# Patient Record
Sex: Female | Born: 1940
Health system: Southern US, Community
[De-identification: ages and names within clinical notes are randomized; demographics above are authoritative.]

## PROBLEM LIST (undated history)

## (undated) DIAGNOSIS — K219 Gastro-esophageal reflux disease without esophagitis: Secondary | ICD-10-CM

## (undated) DIAGNOSIS — T7840XA Allergy, unspecified, initial encounter: Secondary | ICD-10-CM

## (undated) DIAGNOSIS — J984 Other disorders of lung: Secondary | ICD-10-CM

## (undated) DIAGNOSIS — J439 Emphysema, unspecified: Secondary | ICD-10-CM

## (undated) DIAGNOSIS — E785 Hyperlipidemia, unspecified: Secondary | ICD-10-CM

## (undated) DIAGNOSIS — R0600 Dyspnea, unspecified: Secondary | ICD-10-CM

## (undated) DIAGNOSIS — K589 Irritable bowel syndrome without diarrhea: Secondary | ICD-10-CM

## (undated) DIAGNOSIS — M1990 Unspecified osteoarthritis, unspecified site: Secondary | ICD-10-CM

## (undated) DIAGNOSIS — G629 Polyneuropathy, unspecified: Secondary | ICD-10-CM

## (undated) DIAGNOSIS — M51379 Other intervertebral disc degeneration, lumbosacral region without mention of lumbar back pain or lower extremity pain: Secondary | ICD-10-CM

## (undated) DIAGNOSIS — C50919 Malignant neoplasm of unspecified site of unspecified female breast: Secondary | ICD-10-CM

## (undated) DIAGNOSIS — M5137 Other intervertebral disc degeneration, lumbosacral region: Secondary | ICD-10-CM

## (undated) DIAGNOSIS — J189 Pneumonia, unspecified organism: Secondary | ICD-10-CM

## (undated) DIAGNOSIS — I2510 Atherosclerotic heart disease of native coronary artery without angina pectoris: Secondary | ICD-10-CM

## (undated) DIAGNOSIS — G589 Mononeuropathy, unspecified: Secondary | ICD-10-CM

## (undated) DIAGNOSIS — I10 Essential (primary) hypertension: Secondary | ICD-10-CM

## (undated) HISTORY — DX: Mononeuropathy, unspecified: G58.9

## (undated) HISTORY — PX: PARTIAL HYSTERECTOMY: SHX80

## (undated) HISTORY — DX: Irritable bowel syndrome, unspecified: K58.9

## (undated) HISTORY — DX: Allergy, unspecified, initial encounter: T78.40XA

## (undated) HISTORY — DX: Gastro-esophageal reflux disease without esophagitis: K21.9

## (undated) HISTORY — DX: Unspecified osteoarthritis, unspecified site: M19.90

## (undated) HISTORY — PX: UPPER GASTROINTESTINAL ENDOSCOPY: SHX188

## (undated) HISTORY — DX: Hyperlipidemia, unspecified: E78.5

## (undated) HISTORY — DX: Essential (primary) hypertension: I10

## (undated) HISTORY — PX: TONSILLECTOMY AND ADENOIDECTOMY: SUR1326

## (undated) HISTORY — PX: COLONOSCOPY: SHX174

## (undated) HISTORY — DX: Malignant neoplasm of unspecified site of unspecified female breast: C50.919

## (undated) HISTORY — PX: EYE SURGERY: SHX253

---

## 1980-01-13 HISTORY — PX: BREAST FIBROADENOMA SURGERY: SHX580

## 1989-04-10 HISTORY — PX: MASTECTOMY PARTIAL / LUMPECTOMY W/ AXILLARY LYMPHADENECTOMY: SUR852

## 1999-05-28 ENCOUNTER — Other Ambulatory Visit: Admission: RE | Admit: 1999-05-28 | Discharge: 1999-05-28 | Payer: Self-pay | Admitting: Internal Medicine

## 1999-10-07 DIAGNOSIS — I2510 Atherosclerotic heart disease of native coronary artery without angina pectoris: Secondary | ICD-10-CM

## 1999-10-07 HISTORY — DX: Atherosclerotic heart disease of native coronary artery without angina pectoris: I25.10

## 1999-10-07 HISTORY — PX: CORONARY ANGIOPLASTY: SHX604

## 1999-10-07 HISTORY — PX: CORONARY ARTERY BYPASS GRAFT: SHX141

## 1999-10-15 ENCOUNTER — Ambulatory Visit (HOSPITAL_BASED_OUTPATIENT_CLINIC_OR_DEPARTMENT_OTHER): Admission: RE | Admit: 1999-10-15 | Discharge: 1999-10-15 | Payer: Self-pay | Admitting: Plastic Surgery

## 1999-12-16 ENCOUNTER — Ambulatory Visit (HOSPITAL_COMMUNITY): Admission: RE | Admit: 1999-12-16 | Discharge: 1999-12-17 | Payer: Self-pay | Admitting: Interventional Cardiology

## 2000-05-28 ENCOUNTER — Encounter: Payer: Self-pay | Admitting: Surgery

## 2000-05-28 ENCOUNTER — Inpatient Hospital Stay (HOSPITAL_COMMUNITY): Admission: AD | Admit: 2000-05-28 | Discharge: 2000-06-01 | Payer: Self-pay | Admitting: *Deleted

## 2000-05-29 ENCOUNTER — Encounter: Payer: Self-pay | Admitting: Surgery

## 2000-05-30 ENCOUNTER — Encounter: Payer: Self-pay | Admitting: Surgery

## 2000-06-23 ENCOUNTER — Encounter (HOSPITAL_COMMUNITY): Admission: RE | Admit: 2000-06-23 | Discharge: 2000-09-21 | Payer: Self-pay | Admitting: *Deleted

## 2001-02-08 ENCOUNTER — Other Ambulatory Visit: Admission: RE | Admit: 2001-02-08 | Discharge: 2001-02-08 | Payer: Self-pay | Admitting: Obstetrics and Gynecology

## 2001-10-22 ENCOUNTER — Emergency Department (HOSPITAL_COMMUNITY): Admission: EM | Admit: 2001-10-22 | Discharge: 2001-10-22 | Payer: Self-pay | Admitting: Emergency Medicine

## 2001-10-22 ENCOUNTER — Encounter: Payer: Self-pay | Admitting: Emergency Medicine

## 2003-12-22 ENCOUNTER — Encounter (INDEPENDENT_AMBULATORY_CARE_PROVIDER_SITE_OTHER): Payer: Self-pay | Admitting: *Deleted

## 2003-12-22 ENCOUNTER — Encounter: Admission: RE | Admit: 2003-12-22 | Discharge: 2003-12-22 | Payer: Self-pay | Admitting: General Surgery

## 2003-12-27 ENCOUNTER — Encounter (HOSPITAL_COMMUNITY): Admission: RE | Admit: 2003-12-27 | Discharge: 2004-03-26 | Payer: Self-pay | Admitting: General Surgery

## 2004-02-01 HISTORY — PX: BREAST SURGERY: SHX581

## 2004-02-02 ENCOUNTER — Ambulatory Visit (HOSPITAL_COMMUNITY): Admission: AD | Admit: 2004-02-02 | Discharge: 2004-02-04 | Payer: Self-pay | Admitting: General Surgery

## 2004-02-02 ENCOUNTER — Encounter (INDEPENDENT_AMBULATORY_CARE_PROVIDER_SITE_OTHER): Payer: Self-pay | Admitting: Specialist

## 2004-06-11 ENCOUNTER — Encounter (INDEPENDENT_AMBULATORY_CARE_PROVIDER_SITE_OTHER): Payer: Self-pay | Admitting: *Deleted

## 2004-06-12 ENCOUNTER — Ambulatory Visit (HOSPITAL_BASED_OUTPATIENT_CLINIC_OR_DEPARTMENT_OTHER): Admission: RE | Admit: 2004-06-12 | Discharge: 2004-06-12 | Payer: Self-pay | Admitting: Plastic Surgery

## 2004-06-12 ENCOUNTER — Encounter (INDEPENDENT_AMBULATORY_CARE_PROVIDER_SITE_OTHER): Payer: Self-pay | Admitting: Specialist

## 2004-06-12 ENCOUNTER — Ambulatory Visit (HOSPITAL_COMMUNITY): Admission: RE | Admit: 2004-06-12 | Discharge: 2004-06-12 | Payer: Self-pay | Admitting: Plastic Surgery

## 2004-07-25 ENCOUNTER — Ambulatory Visit (HOSPITAL_BASED_OUTPATIENT_CLINIC_OR_DEPARTMENT_OTHER): Admission: RE | Admit: 2004-07-25 | Discharge: 2004-07-25 | Payer: Self-pay | Admitting: Plastic Surgery

## 2006-02-24 ENCOUNTER — Ambulatory Visit (HOSPITAL_BASED_OUTPATIENT_CLINIC_OR_DEPARTMENT_OTHER): Admission: RE | Admit: 2006-02-24 | Discharge: 2006-02-24 | Payer: Self-pay | Admitting: Orthopaedic Surgery

## 2007-02-10 ENCOUNTER — Ambulatory Visit (HOSPITAL_COMMUNITY): Admission: RE | Admit: 2007-02-10 | Discharge: 2007-02-10 | Payer: Self-pay | Admitting: Orthopaedic Surgery

## 2007-03-05 ENCOUNTER — Encounter: Admission: RE | Admit: 2007-03-05 | Discharge: 2007-03-05 | Payer: Self-pay | Admitting: General Surgery

## 2007-07-16 ENCOUNTER — Ambulatory Visit: Payer: Self-pay | Admitting: Gastroenterology

## 2007-07-28 ENCOUNTER — Ambulatory Visit: Payer: Self-pay | Admitting: Gastroenterology

## 2007-11-24 ENCOUNTER — Encounter: Admission: RE | Admit: 2007-11-24 | Discharge: 2007-11-24 | Payer: Self-pay | Admitting: Surgery

## 2007-11-24 ENCOUNTER — Encounter (INDEPENDENT_AMBULATORY_CARE_PROVIDER_SITE_OTHER): Payer: Self-pay | Admitting: *Deleted

## 2008-02-10 ENCOUNTER — Encounter: Admission: RE | Admit: 2008-02-10 | Discharge: 2008-02-10 | Payer: Self-pay | Admitting: Internal Medicine

## 2009-10-06 HISTORY — PX: KNEE ARTHROSCOPY: SUR90

## 2009-10-15 ENCOUNTER — Encounter: Admission: RE | Admit: 2009-10-15 | Discharge: 2009-10-15 | Payer: Self-pay | Admitting: Orthopaedic Surgery

## 2009-10-30 ENCOUNTER — Ambulatory Visit (HOSPITAL_BASED_OUTPATIENT_CLINIC_OR_DEPARTMENT_OTHER): Admission: RE | Admit: 2009-10-30 | Discharge: 2009-10-30 | Payer: Self-pay | Admitting: Orthopaedic Surgery

## 2010-09-23 DIAGNOSIS — K5730 Diverticulosis of large intestine without perforation or abscess without bleeding: Secondary | ICD-10-CM

## 2010-09-23 DIAGNOSIS — K644 Residual hemorrhoidal skin tags: Secondary | ICD-10-CM | POA: Insufficient documentation

## 2010-09-23 HISTORY — DX: Diverticulosis of large intestine without perforation or abscess without bleeding: K57.30

## 2010-09-24 ENCOUNTER — Ambulatory Visit: Payer: Self-pay | Admitting: Gastroenterology

## 2010-09-24 DIAGNOSIS — R109 Unspecified abdominal pain: Secondary | ICD-10-CM | POA: Insufficient documentation

## 2010-09-24 DIAGNOSIS — J328 Other chronic sinusitis: Secondary | ICD-10-CM | POA: Insufficient documentation

## 2010-09-24 LAB — CONVERTED CEMR LAB
ALT: 24 units/L (ref 0–35)
AST: 35 units/L (ref 0–37)
Albumin: 4.1 g/dL (ref 3.5–5.2)
Alkaline Phosphatase: 72 units/L (ref 39–117)
Amylase: 59 units/L (ref 27–131)
BUN: 22 mg/dL (ref 6–23)
Basophils Relative: 0.7 % (ref 0.0–3.0)
Chloride: 101 meq/L (ref 96–112)
Eosinophils Relative: 1 % (ref 0.0–5.0)
Glucose, Bld: 83 mg/dL (ref 70–99)
Lipase: 44 units/L (ref 11.0–59.0)
Lymphocytes Relative: 34.2 % (ref 12.0–46.0)
Neutrophils Relative %: 52 % (ref 43.0–77.0)
Potassium: 4.4 meq/L (ref 3.5–5.1)
RBC: 3.98 M/uL (ref 3.87–5.11)
Sed Rate: 19 mm/hr (ref 0–22)
Sodium: 139 meq/L (ref 135–145)
TSH: 1.15 microintl units/mL (ref 0.35–5.50)
Total Bilirubin: 0.6 mg/dL (ref 0.3–1.2)
Transferrin: 285.7 mg/dL (ref 212.0–360.0)
WBC: 3 10*3/uL — ABNORMAL LOW (ref 4.5–10.5)

## 2010-09-25 ENCOUNTER — Encounter: Payer: Self-pay | Admitting: Gastroenterology

## 2010-10-09 ENCOUNTER — Ambulatory Visit (HOSPITAL_COMMUNITY)
Admission: RE | Admit: 2010-10-09 | Discharge: 2010-10-09 | Payer: Self-pay | Source: Home / Self Care | Attending: Gastroenterology | Admitting: Gastroenterology

## 2010-10-23 ENCOUNTER — Ambulatory Visit
Admission: RE | Admit: 2010-10-23 | Discharge: 2010-10-23 | Payer: Self-pay | Source: Home / Self Care | Attending: Gastroenterology | Admitting: Gastroenterology

## 2010-10-23 ENCOUNTER — Other Ambulatory Visit: Payer: Self-pay | Admitting: Gastroenterology

## 2010-10-24 ENCOUNTER — Telehealth (INDEPENDENT_AMBULATORY_CARE_PROVIDER_SITE_OTHER): Payer: Self-pay | Admitting: *Deleted

## 2010-10-24 LAB — HELICOBACTER PYLORI SCREEN-BIOPSY: UREASE: NEGATIVE

## 2010-10-25 ENCOUNTER — Encounter: Payer: Self-pay | Admitting: Gastroenterology

## 2010-10-27 ENCOUNTER — Encounter: Payer: Self-pay | Admitting: Orthopaedic Surgery

## 2010-10-31 DIAGNOSIS — K8020 Calculus of gallbladder without cholecystitis without obstruction: Secondary | ICD-10-CM | POA: Insufficient documentation

## 2010-11-05 ENCOUNTER — Other Ambulatory Visit (HOSPITAL_COMMUNITY): Payer: Self-pay | Admitting: Surgery

## 2010-11-05 ENCOUNTER — Encounter: Payer: Self-pay | Admitting: Gastroenterology

## 2010-11-05 DIAGNOSIS — K8020 Calculus of gallbladder without cholecystitis without obstruction: Secondary | ICD-10-CM

## 2010-11-07 NOTE — Op Note (Signed)
Summary: Excision of Nodule in Rectus Abdominis  NAME:  Tiffany Caldwell, Tiffany Caldwell                       ACCOUNT NO.:  [id number]   MEDICAL RECORD NO.:  [id number]                   PATIENT TYPE:  AMB   LOCATION:  DSC                                  FACILITY:  [facility]   PHYSICIAN:  Rose Phi. Maple Hudson, M.D.                DATE OF BIRTH:  1941-08-17   DATE OF PROCEDURE:  06/11/2004  DATE OF DISCHARGE:                                 OPERATIVE REPORT   PREOPERATIVE DIAGNOSIS:  Nodule in transverse rectus abdominis myocutaneous  flap.   POSTOPERATIVE DIAGNOSIS:  Nodule in transverse rectus abdominis myocutaneous  flap.   OPERATION PERFORMED:  Excision of nodule in transverse rectus abdominis  myocutaneous flap.   SURGEON:  Rose Phi. Maple Hudson, M.D.   ANESTHESIA:  General.   DESCRIPTION OF PROCEDURE:  The patient was placed on the operating table and  the left breast and TRAM area prepped and draped in the usual fashion.  A  curved incision overlying the palpable movable nodule was made and then  incision made and it was sharply excised.  Hemostasis obtained with cautery.  It was infiltrated with 0.25% Marcaine.  It was closed in two layers with 3-  0 Vicryl and subcuticular 4-0 Monocryl and Steri-Strips.  Dressing was  applied.  The patient was then transferred to the recovery room in  satisfactory condition having tolerated the procedure well.                                               Rose Phi. Maple Hudson, M.D.    PRY/MEDQ  D:  06/12/2004  T:  06/12/2004  Job:  [id number]

## 2010-11-07 NOTE — Assessment & Plan Note (Signed)
Summary: abd pain...as.   History of Present Illness Visit Type: Follow-up Visit Primary GI MD: Sheryn Bison MD FACP FAGA Primary Provider: Kirby Funk, MD Chief Complaint: Abdominal pain x6 months History of Present Illness:   Original Dictation loss by computer system.  This patient continues with atypical epigastric and right upper quadrant pain in a patient who has mesh in her upper abdomen per previous repair of a left mastectomy by Dr. Jamey Ripa. She has postprandial pain and nausea, and has vague discomfort in her right upper quadrant area and a very limited space. She denies lower gastrointestinal or hepatobiliary complaints, had CT scan of the abdomen 2 years ago which was unremarkable. She has no alarming features but continues with pain of unexplained etiology.   GI Review of Systems    Reports abdominal pain, acid reflux, and  bloating.     Location of  Abdominal pain: right side.    Denies belching, chest pain, dysphagia with liquids, dysphagia with solids, heartburn, loss of appetite, nausea, vomiting, vomiting blood, weight loss, and  weight gain.      Reports irritable bowel syndrome.     Denies anal fissure, black tarry stools, change in bowel habit, constipation, diarrhea, diverticulosis, fecal incontinence, heme positive stool, hemorrhoids, jaundice, light color stool, liver problems, rectal bleeding, and  rectal pain. Preventive Screening-Counseling & Management  Alcohol-Tobacco     Smoking Status: quit      Drug Use:  no.      Current Medications (verified): 1)  Zocor 20 Mg Tabs (Simvastatin) .... Once Daily 2)  Diovan Hct 80-12.5 Mg Tabs (Valsartan-Hydrochlorothiazide) .... Once Daily 3)  Zyrtec Allergy 10 Mg Caps (Cetirizine Hcl) .... Every Other Day 4)  Aspirin 81 Mg Tbec (Aspirin) .... Once Daily 5)  Multivitamins  Tabs (Multiple Vitamin) .... Once Daily 6)  Fish Oil Double Strength 1200 Mg Caps (Omega-3 Fatty Acids) .... Two Times A Day 7)   Calcium-Vitamin D 600-125 Mg-Unit Tabs (Calcium-Vitamin D) .... Two Times A Day 8)  Glucosamine/chondroitin 1500/1200mg  .... Two Times A Day 9)  Tylenol Extra Strength 500 Mg Tabs (Acetaminophen) .... Take 2 Tablets By Mouth Two Times A Day 10)  Hyomax-Sr 0.375 Mg Xr12h-Tab (Hyoscyamine Sulfate) .... As Needed  Allergies (verified): 1)  ! * Z Pack  Past History:  Past medical, surgical, family and social histories (including risk factors) reviewed for relevance to current acute and chronic problems.  Past Medical History: Reviewed history from 09/23/2010 and no changes required. Abdominal Pain  Past Surgical History: Reviewed history from 09/23/2010 and no changes required. Lumpectomy Hysterectomy D& C Fibroadenoma Removed x 4-bilateral breast Tonsillectomy & Adnoidectomy Left Mastectomy CABG  Family History: Reviewed history from 09/23/2010 and no changes required. Family History of Colon Cancer: Uncle, Grandmother, Son  Social History: Reviewed history and no changes required. Occupation: Retired Patient is a former smoker.  Alcohol Use - yes Daily Caffeine Use Illicit Drug Use - no Smoking Status:  quit Drug Use:  no  Review of Systems  The patient denies allergy/sinus, anemia, anxiety-new, arthritis/joint pain, back pain, blood in urine, breast changes/lumps, change in vision, confusion, cough, coughing up blood, depression-new, fainting, fatigue, fever, headaches-new, hearing problems, heart murmur, heart rhythm changes, itching, menstrual pain, muscle pains/cramps, night sweats, nosebleeds, pregnancy symptoms, shortness of breath, skin rash, sleeping problems, sore throat, swelling of feet/legs, swollen lymph glands, thirst - excessive , urination - excessive , urination changes/pain, urine leakage, vision changes, and voice change.    Vital Signs:  Patient  profile:   70 year old female Height:      66.5 inches Weight:      147.50 pounds BMI:     23.54 Pulse  rate:   60 / minute Pulse rhythm:   irregular BP sitting:   120 / 68  (left arm) Cuff size:   regular  Vitals Entered By: June McMurray CMA Duncan Dull) (September 24, 2010 2:47 PM)  Physical Exam  General:  Well developed, well nourished, no acute distress.healthy appearing.   Head:  Normocephalic and atraumatic. Eyes:  PERRLA, no icterus.exam deferred to patient's ophthalmologist.   Lungs:  Clear throughout to auscultation. Heart:  Regular rate and rhythm; no murmurs, rubs,  or bruits. Abdomen:  Soft, nontender and nondistended. No masses, hepatosplenomegaly or hernias noted. Normal bowel sounds.Obvious deformity of her abdominal wall but no specific ventral or incisional hernias noted. Bowel sounds are nonobstructive but she does have very high pitched bowel sounds noted. Rectal:  deferred. Extremities:  No clubbing, cyanosis, edema or deformities noted. Neurologic:  Alert and  oriented x4;  grossly normal neurologically. Cervical Nodes:  No significant cervical adenopathy. Psych:  Alert and cooperative. Normal mood and affect.   Impression & Recommendations:  Problem # 1:  ABDOMINAL PAIN, UNSPECIFIED SITE (ICD-789.00) Assessment Deteriorated Probable musculoskeletal pain-rule out peptic ulcer disease, cholelithiasis, partial bowel obstruction, et Karie Soda. I have scheduled ultrasound and endoscopic exam. She's been placed on Nexium 40 mg a day with standard antireflux maneuvers pending further evaluation.Review of her record she has no previous ultrasound exam although she has had multiple colonoscopies. Orders: Ultrasound Abdomen (UAS)  Problem # 2:  DIVERTICULOSIS OF COLON (ICD-562.10) Assessment: Unchanged continue high-fiber diet as tolerated. Screening labs have been ordered. Orders: TLB-CBC Platelet - w/Differential (85025-CBCD) TLB-BMP (Basic Metabolic Panel-BMET) (80048-METABOL) TLB-Hepatic/Liver Function Pnl (80076-HEPATIC) TLB-TSH (Thyroid Stimulating Hormone)  (84443-TSH) TLB-B12, Serum-Total ONLY (40981-X91) TLB-Folic Acid (Folate) (82746-FOL) TLB-Iron, (Fe) Total (83540-FE) TLB-IBC Pnl (Iron/FE;Transferrin) (83550-IBC) TLB-Amylase (82150-AMYL) TLB-Lipase (83690-LIPASE) TLB-Sedimentation Rate (ESR) (85652-ESR) TLB-IgA (Immunoglobulin A) (82784-IGA) T-Sprue Panel (Celiac Disease Aby Eval) (83516x3/[address])  Other Orders: EGD (EGD)  Patient Instructions: 1)  You have been scheduled for an abdominal ultrasound on 09/26/10 @ 8 am at [facility]. Please arrive at 7:45 am for registration. 2)  You have been scheduled for an endoscopy. Please follow written prep instructions that were given to you today at your visit.  3)  Your physician requests that you go to the basement floor of our office to have the following labwork completed before leaving today: [facility] Panel, Anemia Profile, Amylase, Lipase adn Sedimentation rate as well as IgA and Sprue Profile. 4)  Please pick up your prescriptions at the pharmacy. Electronic prescription(s) has already been sent for Nexium. 5)  Copy sent to : Kirby Funk, MD 6)  The medication list was reviewed and reconciled.  All changed / newly prescribed medications were explained.  A complete medication list was provided to the patient / caregiver. Prescriptions: NEXIUM 40 MG CPDR (ESOMEPRAZOLE MAGNESIUM) Take 1 tablet by mouth once a day  #30 x 2   Entered by:   Lamona Curl CMA (AAMA)   Authorized by:   Mardella Layman MD [facility]   Signed by:   Lamona Curl CMA (AAMA) on 09/24/2010   Method used:   Electronically to        [facility]* (retail)       [address]       [place], [place]  [address]  Ph: [phone]       Fax: [phone]   RxID:   [id number]

## 2010-11-07 NOTE — Miscellaneous (Signed)
Summary: Orders Update/clotest  Clinical Lists Changes  Orders: Added new Test order of TLB-H Pylori Screen Gastric Biopsy (83013-CLOTEST) - Signed 

## 2010-11-07 NOTE — Letter (Signed)
Summary: EGD Instructions  [facility]  [address] [place], [place] [address]   Phone: [phone]  Fax: [phone]       Tiffany Caldwell    1941/01/23    MRN: [id number]       Procedure Day Dorna Bloom: Wednesday 10/23/10     Arrival Time: 3:00 pm     Procedure Time: 4:00 pm     Location of Procedure:                    _x  _ [facility] (4th Floor)  PREPARATION FOR ENDOSCOPY   On 10/23/10 THE DAY OF THE PROCEDURE:  1.   No solid foods, milk or milk products are allowed after midnight the night before your procedure.  2.   Do not drink anything colored red or purple.  Avoid juices with pulp.  No orange juice.  3.  You may drink clear liquids until 2:00 pm, which is 2 hours before your procedure.                                                                                                CLEAR LIQUIDS INCLUDE: Water Jello Ice Popsicles Tea (sugar ok, no milk/cream) Powdered fruit flavored drinks Coffee (sugar ok, no milk/cream) Gatorade Juice: apple, white grape, white cranberry  Lemonade Clear bullion, consomm, broth Carbonated beverages (any kind) Strained chicken noodle soup Hard Candy   MEDICATION INSTRUCTIONS  Unless otherwise instructed, you should take regular prescription medications with a small sip of water as early as possible the morning of your procedure.                   OTHER INSTRUCTIONS  You will need a responsible adult at least 72 years of age to accompany you and drive you home.   This person must remain in the waiting room during your procedure.  Wear loose fitting clothing that is easily removed.  Leave jewelry and other valuables at home.  However, you may wish to bring a book to read or an iPod/MP3 player to listen to music as you wait for your procedure to start.  Remove all body piercing jewelry and leave at home.  Total time from sign-in until discharge is approximately 2-3 hours.  You  should go home directly after your procedure and rest.  You can resume normal activities the day after your procedure.  The day of your procedure you should not:   Drive   Make legal decisions   Operate machinery   Drink alcohol   Return to work  You will receive specific instructions about eating, activities and medications before you leave.    The above instructions have been reviewed and explained to me by   Lamona Curl CMA Duncan Dull)  September 24, 2010 3:27 PM     I fully understand and can verbalize these instructions _____________________________ Date _________

## 2010-11-07 NOTE — Letter (Signed)
Summary: Patient [facility] Biopsy Results  [facility]  [address] [place], [place] [address]   Phone: [phone]  Fax: [phone]        October 25, 2010 MRN: [id number]    Tiffany Caldwell [address] [place], [place]  [address]    Dear Ms. Kirsh,  I am pleased to inform you that the biopsies taken during your recent endoscopic examination did not show any evidence of cancer upon pathologic examination.  Additional information/recommendations:  __No further action is needed at this time.  Please follow-up with      your primary care physician for your other healthcare needs.  __ Please call [phone] to schedule a return visit to review      your condition.  xx__ Continue with the treatment plan as outlined on the day of your      exam.  __ You should have a repeat endoscopic examination for this problem              in _ months/years.   Please call us if you are having persistent problems or have questions about your condition that have not been fully answered at this time.  Sincerely,  Mardella Layman MD [facility]  This letter has been electronically signed by your physician.  Appended Document: Patient Notice-Endo Biopsy Results Letter mailed

## 2010-11-07 NOTE — Procedures (Addendum)
Summary: Upper Endoscopy  Patient: Tiffany Caldwell Note: All result statuses are Final unless otherwise noted.  Tests: (1) Upper Endoscopy (EGD)   EGD Upper Endoscopy       DONE     [facility]     [address]     [place], [place]  [address]           ENDOSCOPY PROCEDURE REPORT           PATIENT:  Tiffany Caldwell, Tiffany Caldwell  MR#:  [id number]     BIRTHDATE:  [birth year], 69 yrs. old  GENDER:  female           ENDOSCOPIST:  Vania Rea. Jarold Motto, MD, [facility]     Referred by:  Kirby Funk, M.D.           PROCEDURE DATE:  10/23/2010     PROCEDURE:  EGD with biopsy, [address]     ASA CLASS:  Class II     INDICATIONS:  RUQ PAIN.           MEDICATIONS:   Fentanyl 50 mg IV, Versed 5 mg     TOPICAL ANESTHETIC:           DESCRIPTION OF PROCEDURE:   After the risks benefits and     alternatives of the procedure were thoroughly explained, informed     consent was obtained.  The LB GIF-H180 [id number] endoscope was     introduced through the mouth and advanced to the second portion of     the duodenum, without limitations.  The instrument was slowly     withdrawn as the mucosa was fully examined.     <<PROCEDUREIMAGES>>           The upper, middle, and distal third of the esophagus were     carefully inspected and no abnormalities were noted. The z-line     was well seen at the GEJ. The endoscope was pushed into the fundus     which was normal including a retroflexed view. The antrum,gastric     body, first and second part of the duodenum were unremarkable. CLO     AND SI BX. DONE.    Retroflexed views revealed no abnormalities.     The scope was then withdrawn from the patient and the procedure     completed.           COMPLICATIONS:  None           ENDOSCOPIC IMPRESSION:     1) Normal EGD     ULTRASOUND SHOWS GALLSTONES.     RECOMMENDATIONS:     1) Await biopsy results     SURGICAL REFERRAL DR.STRECK.           REPEAT EXAM:  No           ______________________________     Vania Rea.  Jarold Motto, MD, [facility]           CC:  Kirby Funk, M.D.           n.     eSIGNED:   Vania Rea. Rudene Poulsen at 10/23/2010 04:24 PM           Clearence Cheek, [id number]  Note: An exclamation mark (!) indicates a result that was not dispersed into the flowsheet. Document Creation Date: 10/23/2010 4:24 PM _______________________________________________________________________  (1) Order result status: Final Collection or observation date-time: 10/23/2010 16:14 Requested date-time:  Receipt date-time:  Reported date-time:  Referring Physician:   Ordering Physician: Sheryn Bison [phone])  Specimen Source:  Source: Launa Grill Order Number: [id number] Lab site:

## 2010-11-07 NOTE — Progress Notes (Signed)
  Phone Note Outgoing Call   Call placed by: Graciella Freer, RN Call placed to: Patient Summary of Call: Schedule surgical consult w/ Dr Jamey Ripa. Initial call taken by: Graciella Freer RN,  October 24, 2010 11:29 AM  Follow-up for Phone Call        Scheduled patient to see Dr Jamey Ripa @ [facility] for 11/05/10 @ 2:10pm. Patient stated understanding. Follow-up by: Graciella Freer RN,  October 24, 2010 11:30 AM

## 2010-11-07 NOTE — Procedures (Signed)
Summary: COLON   Colonoscopy  Procedure date:  07/28/2007  Findings:      Location:  [facility].   Patient Name: Tiffany Caldwell, Tiffany Caldwell MRN:  Procedure Procedures: Colonoscopy CPT: [address].  Personnel: Endoscopist: Vania Rea. Jarold Motto, MD.  Exam Location: Exam performed in Outpatient Clinic. Outpatient  Patient Consent: Procedure, Alternatives, Risks and Benefits discussed, consent obtained, from patient. Consent was obtained by the RN.  Indications  Increased Risk Screening: For family history of colorectal neoplasia, in  sibling age at onset: 4.  History  Current Medications: Patient is taking an non-steroidal medication. Patient is not currently taking Coumadin.  Medical/ Surgical History: Hypertension, Hyperlipidemia,  Pre-Exam Physical: Performed Jul 28, 2007. Cardio-pulmonary exam, Rectal exam, Abdominal exam, Extremity exam, Mental status exam WNL.  Comments: Pt. history reviewed/updated, physical exam performed prior to initiation of sedation? yes Exam Exam: Extent of exam reached: Ileum, extent intended: Cecum.  The cecum was identified by appendiceal orifice and IC valve. Patient position: on left side. Time to Cecum: 00:04:47. Time for Withdrawl: 00:05:33. Colon retroflexion performed. Images taken. ASA Classification: II. Tolerance: excellent.  Monitoring: Pulse and BP monitoring, Oximetry used. Supplemental O2 given. at 2 Liters.  Colon Prep Used Golytely for colon prep. Prep results: excellent.  Sedation Meds: Patient assessed and found to be appropriate for moderate (conscious) sedation. Sedation was managed by the Endoscopist. Monitored Anesthesia Care. Fentanyl 50 mcg. given IV. Versed 5 mg. given IV.  Instrument(s): CF 140L. Serial [id number].  Findings - DIVERTICULOSIS: Descending Colon to Sigmoid Colon. Not bleeding. ICD9: Diverticulosis, Colon: 562.10.  - NORMAL EXAM: Cecum to Rectum. Not Seen: Polyps. AVM's. Colitis. Tumors.  Crohn's.  - NORMAL EXAM: Sigmoid Colon to Rectum.   Assessment Normal examination.  Diagnoses: 562.10: Diverticulosis, Colon.   Events  Unplanned Interventions: No intervention was required.  Plans Medication Plan: Continue current medications.  Patient Education: Patient given standard instructions for: Diverticulosis. Patient instructed to get routine colonoscopy every 5 years.  Disposition: After procedure patient sent to recovery. After recovery patient sent home.  Scheduling/Referral: Follow-Up prn.   This report was created from the original endoscopy report, which was reviewed and signed by the above listed endoscopist.

## 2010-11-18 ENCOUNTER — Ambulatory Visit (HOSPITAL_COMMUNITY)
Admission: RE | Admit: 2010-11-18 | Discharge: 2010-11-18 | Disposition: A | Discharge status: Home / Self Care | Payer: Medicare Other | Source: Ambulatory Visit | Attending: Surgery | Admitting: Surgery

## 2010-11-18 DIAGNOSIS — R109 Unspecified abdominal pain: Secondary | ICD-10-CM | POA: Insufficient documentation

## 2010-11-18 DIAGNOSIS — K8020 Calculus of gallbladder without cholecystitis without obstruction: Secondary | ICD-10-CM | POA: Insufficient documentation

## 2010-11-18 MED ORDER — TECHNETIUM TC 99M MEBROFENIN IV KIT
5.0000 | PACK | Freq: Once | INTRAVENOUS | Status: AC | PRN
  Administered 2010-11-18: 5 via INTRAVENOUS

## 2010-11-27 NOTE — Letter (Signed)
Summary: [facility]   Imported By: Lennie Odor 11/21/2010 11:55:20  _____________________________________________________________________  External Attachment:    Type:   Image     Comment:   External Document

## 2010-12-23 LAB — BASIC METABOLIC PANEL
BUN: 17 mg/dL (ref 6–23)
CO2: 30 mEq/L (ref 19–32)
Calcium: 10.6 mg/dL — ABNORMAL HIGH (ref 8.4–10.5)
Chloride: 99 mEq/L (ref 96–112)
Creatinine, Ser: 0.78 mg/dL (ref 0.4–1.2)
GFR calc Af Amer: >60 mL/min (ref >60)

## 2011-02-21 NOTE — Op Note (Signed)
NAME:  Tiffany Caldwell, Tiffany Caldwell                       ACCOUNT NO.:  [id number]   MEDICAL RECORD NO.:  [id number]                   PATIENT TYPE:  AMB   LOCATION:  DSC                                  FACILITY:  [facility]   PHYSICIAN:  Rose Phi. Maple Hudson, M.D.                DATE OF BIRTH:  07-Oct-1940   DATE OF PROCEDURE:  06/11/2004  DATE OF DISCHARGE:                                 OPERATIVE REPORT   PREOPERATIVE DIAGNOSIS:  Nodule in transverse rectus abdominis myocutaneous  flap.   POSTOPERATIVE DIAGNOSIS:  Nodule in transverse rectus abdominis myocutaneous  flap.   OPERATION PERFORMED:  Excision of nodule in transverse rectus abdominis  myocutaneous flap.   SURGEON:  Rose Phi. Maple Hudson, M.D.   ANESTHESIA:  General.   DESCRIPTION OF PROCEDURE:  The patient was placed on the operating table and  the left breast and TRAM area prepped and draped in the usual fashion.  A  curved incision overlying the palpable movable nodule was made and then  incision made and it was sharply excised.  Hemostasis obtained with cautery.  It was infiltrated with 0.25% Marcaine.  It was closed in two layers with 3-  0 Vicryl and subcuticular 4-0 Monocryl and Steri-Strips.  Dressing was  applied.  The patient was then transferred to the recovery room in  satisfactory condition having tolerated the procedure well.                                               Rose Phi. Maple Hudson, M.D.    PRY/MEDQ  D:  06/12/2004  T:  06/12/2004  Job:  [id number]

## 2011-02-21 NOTE — Op Note (Signed)
NAME:  Tiffany Caldwell, Tiffany Caldwell                       ACCOUNT NO.:  [id number]   MEDICAL RECORD NO.:  [id number]                   PATIENT TYPE:  OIB   LOCATION:  2899                                 FACILITY:  [facility]   PHYSICIAN:  Rose Phi. Maple Hudson, M.D.                DATE OF BIRTH:  [birth year]   DATE OF PROCEDURE:  02/01/2004  DATE OF DISCHARGE:                                 OPERATIVE REPORT   PREOPERATIVE DIAGNOSIS:  Recurrent carcinoma of the left breast.   POSTOPERATIVE DIAGNOSIS:  Recurrent carcinoma of the left breast.   OPERATION PERFORMED:  Left total mastectomy.   SURGEON:  Rose Phi. Maple Hudson, M.D.   ASSISTANT:  Currie Paris, M.D.   ANESTHESIA:  General.   DESCRIPTION OF PROCEDURE:  After suitable general anesthesia was induced,  the patient was placed in supine position with both arms extended on the arm  board.  She was prepped and draped out in the usual fashion for a transverse  rectus abdominis myocutaneous reconstruction.   Transverse elliptical incision was then made incorporating the nipple  areolar complex as well as the previous biopsy site.  We then dissected the  flaps going superiorly to near the clavicle and medially to the sternum and  inferiorly to the rectus fascia and laterally to the latissimus dorsi in the  standard fashion.  The breast was then removed by dissecting it from medial  to lateral incorporating the pectoralis fascia.  Hemostasis was obtained  with the cautery.  I thoroughly irrigated the field with saline.   Alfredia Ferguson, M.D. was to do the reconstruction using a TRAM flap and  that will be dictated in a separate note.                                               Rose Phi. Maple Hudson, M.D.    PRY/MEDQ  D:  02/02/2004  T:  02/02/2004  Job:  [id number]

## 2011-02-21 NOTE — Op Note (Signed)
NAMEKAMEREN, BAADE NO.:  [id number]   MEDICAL RECORD NO.:  [id number]          PATIENT TYPE:  AMB   LOCATION:  DSC                          FACILITY:  [facility]   PHYSICIAN:  Alfredia Ferguson, M.D.  DATE OF BIRTH:  February 03, 1941   DATE OF PROCEDURE:  07/25/2004  DATE OF DISCHARGE:                                 OPERATIVE REPORT   PREOPERATIVE DIAGNOSIS:  1.  History of breast cancer.  2.  Acquired absence of left breast.   POSTOPERATIVE DIAGNOSIS:  1.  History of breast cancer.  2.  Acquired absence of left breast.   PROCEDURE:  Left nipple reconstruction following TRAM flap.   SURGEON:  Alfredia Ferguson, M.D.   ANESTHESIA:  None required.   INDICATIONS FOR PROCEDURE:  This is a 70 year old woman who is status post  left mastectomy and TRAM reconstruction.  She is now ready for nipple  reconstruction.  She understands the risks of surgery including failure of  the procedure due to vascular compromise, malposition of the nipple,  shrinkage of the nipple, and overall dissatisfaction.  Inspite of these  risks, the patient wishes to proceed with surgery.   DESCRIPTION OF PROCEDURE:  With the patient in the sitting position,  location of the new nipple was chosen relative to the native breast.  The  patient was then placed in the supine position.  A 42 mm diameter circle was  drawn around the location of the nipple.  The area was prepped with Betadine  and draped with sterile drapes.  Within the confines of this circle, a  tripartite flap which was inferiorly based was drawn.  The three points of  this flap were pointing to the 3 o'clock, 9 o'clock, and 12 o'clock position  with a 2 cm skin pedicle inferiorly.  This flap was incised and elevated  with approximately 3 to 4 mm of fatty tissue on the undersurface of the  skin.  The two flaps pointing to the 3 o'clock and 9 o'clock positions were  rolled toward each other and the tip of one flap was sewn into the  bottom  corner of the other flap.  The tip of the opposite flap was sewn into the  top corner of the first flap.  This created a cylinder.  The flap pointing  to the 12 o'clock position then was sewn down on top of this cylinder to  close the cylinder.  4-0 chromic suture was used to create the cylinder.  The donor site was closed with multiple interrupted 4-0 PDS.  Skin edges  were united using 4-0 chromic suture.  The patient tolerated the procedure  well.  There was excellent vascularity of the nipple.  The area was  cleansed, dried, and bulky dressing was applied.      WBB/MEDQ  D:  07/25/2004  T:  07/25/2004  Job:  [id number]

## 2011-02-21 NOTE — Procedures (Signed)
[place]. [facility]  Patient:    Tiffany Caldwell, Tiffany Caldwell                    MRN: [id number] Proc. Date: 12/16/99 Adm. Date:  [id number] Attending:  Lyn Records. Iii CC:         Cardiac Catheterization Laboratory             Norva Pavlov, M.D.             Thora Lance, M.D.             Meade Maw, M.D.                           Procedure Report  CINE #:  [id number]  PROCEDURE PERFORMED:  Percutaneous coronary intervention on the left anterior descending coronary artery with stenting of the left anterior descending coronary artery.  CARDIOLOGIST:  Celso Sickle, M.D.  INDICATIONS:  Recent increasing anginal symptoms in this 70 year old female with a prior history of cigarette smoking, documented to have a high-grade mid-LAD stenosis by cardiac catheterization this morning, performed by Dr. Meade Maw.  DESCRIPTION OF PROCEDURE:  After the diagnostic procedure was performed by Dr. Meade Maw, a coronary stenting was performed.  We used a 6-French #4 left Judkins guide catheter, an 0.014, 190 cm long BMW wire, and deployed a 3.0 mm x  15.0 mm long NIR [place] stent to 14 atmospheres.  This balloon inflation was done for approximately 58 seconds.  A second balloon inflation to 10 atmospheres was  performed for 20 seconds.  The ACT post-procedure was 271 seconds.  The patient had been started on a double bolus Integrilin infusion, and was given 3500 units of IV heparin.  CONCLUSION:  Successful percutaneous cardiac intervention on the left anterior descending coronary artery with a reduction in the stenosis from 85% to 0%, with direct stenting.  PLAN:  Aspirin and Plavix.  Further management per Dr. Fraser Din.  Integrilin infusion 18 hours.DD:  12/16/99 TD:  12/16/99 Job: [id number]

## 2011-02-21 NOTE — Op Note (Signed)
NAMEAIYAH, SCARPELLI NO.:  [id number]   MEDICAL RECORD NO.:  [id number]          PATIENT TYPE:  AMB   LOCATION:  DSC                          FACILITY:  [facility]   PHYSICIAN:  Lubertha Basque. Dalldorf, M.D.DATE OF BIRTH:  04/15/1941   DATE OF PROCEDURE:  02/24/2006  DATE OF DISCHARGE:                                 OPERATIVE REPORT   PREOPERATIVE DIAGNOSIS:  Right small toe hard corn.   POSTOPERATIVE DIAGNOSIS:  Right small toe hard corn.   PROCEDURE:  Right small toe partial condylectomy.   ANESTHESIA:  Ankle block MAC.   ATTENDING SURGEON:  Lubertha Basque. Jerl Santos, M.D.   ASSISTANT:  Lindwood Qua, P.A.   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  She actually had a similar  problem about 40 years ago on the opposite small toe which responded to a  partial condylectomy after another smaller procedure.  Nevertheless, she has  had difficulty fitting into shoes that do not bother her right foot at this  point.  She would like something done.  She has tried pads to no avail.  By  x-ray, she has a prominence under the area of her hard corn and she is  offered a partial condylectomy which was basically the definitive procedure  on the opposite side back in the 1960s.  Informed operative consent was  obtained after discussion of the possible complications of, reactions to  anesthesia and infection.   SUMMARY:  Under ankle block anesthetic through a small dorsolateral  incision, a partial condylectomy of the small toe proximal phalanx was  performed under fluoroscopic guidance.   DESCRIPTION OF PROCEDURE:  The patient was taken to the operating suite  where ankle block was applied.  She was also given some sedation.  She was  positioned supine and prepped and draped in the normal sterile fashion.  After administration of preop IV Kefzol, the right leg was elevated,  exsanguinated, and a  tourniquet inflated about the calf. A small  dorsolateral incision was made in elliptical fashion to remove the corn.  Dissection was carried down to the bone.  There was some prominence of the  distal portion of the proximal phalanx which was confirmed on fluoroscopy.  I used a saw to remove this prominence along with a rongeur.  Fluoroscopy  was again used to confirm adequate resection of this portion of the condyle.  The wound was irrigated and the tourniquet was deflated. A small amount of  bleeding was easily controlled we bovie cautery.  The skin was  reapproximated with nylon.  Some Adaptic was applied followed by dry gauze  and a loose wrap.  Estimated blood loss and intraoperative fluids can be  obtained from anesthesia records as can accurate tourniquet time.   DISPOSITION:  The patient was taken to recovery in stable addition.  Plans  were for her to go home the same-day and followup in the office in less than  a week.  I will contact her by phone tonight.  Lubertha Basque Jerl Santos, M.D.  Electronically Signed     PGD/MEDQ  D:  02/24/2006  T:  02/24/2006  Job:  [id number]

## 2011-02-21 NOTE — Op Note (Signed)
[place]. [facility]  Patient:    Tiffany Caldwell, Tiffany Caldwell                    MRN: [id number] Proc. Date: 05/28/00 Adm. Date:  [id number] Attending:  Cleatrice Burke CC:         Meade Maw, M.D.  Cardiac Cath. Lab. [place].  Medical Record Department [facility].   Operative Report  PREOPERATIVE DIAGNOSIS:  Dissection of the left main and proximal left anterior descending coronary artery, status post percutaneous transluminal coronary angioplasty.  POSTOPERATIVE DIAGNOSIS:  Dissection of the left main and proximal left anterior descending coronary artery, status post percutaneous transluminal coronary angioplasty.  OPERATIVE PROCEDURE:  Emergency median sternotomy, extracorporeal circulation, coronary artery bypass graft surgery x 3 using the left internal mammary artery graft to the left anterior descending coronary artery, with a saphenous vein graft to the diagonal branch of the left anterior descending, and a saphenous vein graft to the obtuse marginal branch of the left circumflex coronary artery.  ATTENDING SURGEON:  Alleen Borne, M.D.  ASSISTANT:  Lissa Merlin, P.A.  ANESTHESIA:  General endotracheal.  CLINICAL HISTORY:  This patient is a 70 year old white female who underwent angioplasty and stent placement in the proximal LAD in March 2001.  She recently presented with a 2-3 day history of chest pain, malaise, and fatigue similar to her previous cardiac symptoms.  She underwent repeat catheterization yesterday which showed 75-85% LAD stenosis within the stent. There was mild proximal narrowing of the right coronary artery of about 30%. Angioplasty was attempted but was complicated by dissection of the proximal LAD extending back into to the left main coronary artery.  The patient remained hemodynamically stable.  After review of the angiograms in the catheterization lab, it was felt that emergency coronary artery bypass surgery was  the best treatment to prevent ischemia, infarction, and death.  I discussed the operative procedure with the patient and her husband including alternatives, benefits, and risks, including bleeding, possible transfusion, infection, stroke, myocardial infarction, and death. They understood and agreed to proceed.  OPERATIVE PROCEDURE:  The patient was taken to the operating room and placed on the table in a supine position.  After induction of general endotracheal anesthesia, a Foley catheter was placed in the bladder using sterile technique.  The patient still had a guide wire and catheter within the dissected coronary artery.  She had received Integrilin.  She remained hemodynamic stable and free of chest pain in the operating room.  Then, the chest, abdomen, and both lower extremities were prepped and draped in the usual sterile manner.  The chest was entered through a median sternotomy incision through the pericardium and midline.  Examination of the heart showed good ventricular contractility.  The ascending aorta had no palpable plaques in it.  Then, the left internal mammary artery was harvested from the chest with pedicle graft.  This is a medium caliber vessel with excellent blood flow through it.  At the same time a 7 mm saphenous vein was harvested from the right lower leg and this vein was a medium size and good quality.  Then, the patient was heparinized and when an adequate activated clotting time was achieved the distal ascending aorta was cannulated using a #20 [place] aortic cannula for atrial inflow.  Venous outflow was achieved using a two stage venous cannula through the right atrial appendage and antegrade cardioplegia and vent cannula was inserted in the aortic root.  The patient  was placed on cardiopulmonary bypass and the distal coronary was identified.  The LAD was a large graftable vessel.  The diagonal branch that came off in the area of the stent was a small but  graftable vessel.  The obtuse marginal was a medium size graftable vessel.  Then, the guide catheter and the wire present within the coronary artery were removed from the sheath in the right groin.  The aorta was then crossclamped and 500 cc of cold blood antegrade cardioplegia was administered in the aortic root with quick arrest of the heart.  Systemic hypothermia to 20 degrees centigrade and topical hypothermia _________ was used.  A temperature probe was placed in the septum and insulated and tied in the pericardium.  The first distal anastomosis was performed to the diagonal branch of the LAD. The internal diameter was 1.5 mm.  The conduit used was a segment of the greater saphenous vein.  The anastomosis was performed in an end-to-side manner using continuous 7-0 Prolene suture.  Flow was measured through the graft and was excellent.  A second distal anastomosis was performed to the obtuse marginal branch.  The internal diameter was 1.6 mm.  The conduit used was a second segment of the greater saphenous vein with the anastomosis performed in an end-to-side manner using continuous 7-0 Prolene suture.  Flow was measured through the graft and was excellent.  Then, another dose of cardioplegia was given down the vein grafts and in the aortic root.  The third distal anastomosis was performed to the midportion of the left anterior descending coronary artery.  The inside diameter was about 2 mm.  The conduit used was a left internal mammary artery and this was brought through an opening in the left pericardium anterior to the phrenic nerve.  This was an anastomosed to the LAD in an end-to-side manner using continuous 8-0 Prolene suture.  The pedicle was tacked to the epicardium with 6-0 Prolene sutures. The patient was rewarmed to 37 degrees centigrade and the clamp removed from the mammary pedicle.  There was rapid warming and with the check of the septum  and return of spontaneous  ventricular fibrillation.   The cross clamp _________ x 39 minutes and the patient defibrillated into sinus rhythm.  A partial occlusion clamp was placed on the aortic root and the two proximal vein graft anastomoses were performed in an end-to-side manners using continuous 6-0 Prolene suture.  The clamp was removed.  The vein graft deaired ____________.  The proximal and distal anastomoses appeared hemostatic and the ______ grafts satisfactory.  Graft markers were placed along the proximal anastomosis.  Two temporary ventricular and atrial pacing wires were placed and brought up through the skin.  When the patient rewarmed to 37 degrees centigrade, she was weaned from cardiopulmonary bypass with no __________________.  Total bypass time was 69 minutes.  Cardiac function appeared excellent with a cardiac output of 4-5 l/min.  Protamine was given and the venous and aortic cannulas were removed without difficulty.  Hemostasis was achieved.  Three chest tubes were placed _______  postpericardium, one in the left pleural space and one in the anterior mediastinum.  The pericardium was ______________________.  The sternum was closed with No. 6 stainless steel wires.  The fascia was closed with a continuous No. 1 Vicryl suture. Subcutaneous tissue was closed using continuous 2-0 Vicryl and the skin with 3-0 Vicryl subcuticular closure.   The lower extremity vein harvest site was closed in layers in a similar manner.  The sponge, needle, and instrument counts were correct according to the scrub nurse.  Dry sterile dressings were applied over the incisions, around the chest tubes, ____________________. The patient remained hemodynamic stable and was transported to the SICU, in guarded but stable condition. DD:  05/29/00 TD:  05/31/00 Job: [id number]

## 2011-02-21 NOTE — Discharge Summary (Signed)
[place]. [facility]  Patient:    Tiffany Caldwell, Tiffany Caldwell                    MRN: [id number] Adm. Date:  [id number] Disc. Date: [id number] Attending:  Cleatrice Burke Dictator:   Lissa Merlin, P.A. CC:         Francisca December, M.D.             Meade Maw, M.D.             Hal T. Stoneking, M.D.                           Discharge Summary  DATE OF BIRTH:  June 29, 2041  SURGEON:  Alleen Borne, M.D.  CARDIOLOGIST:  Francisca December, M.D.  CARDIAC CATHETERIZATION:  Meade Maw, M.D.  PRIMARY CARE PHYSICIAN:  Hal T. Stoneking, M.D.  ADMISSION DIAGNOSIS:  Unstable angina.  PAST MEDICAL HISTORY:  1. Known coronary artery disease with stent on December 16, 1999.  2. Irritable bowel syndrome.  3. History of breast cancer.  DISCHARGE DIAGNOSES: 1. Acute dissection of the left anterior descending and left main, status post    percutaneous transluminal coronary angioplasty on May 28, 2000. 2. Status post emergent coronary artery bypass grafting x 3 on May 28, 2000, with the following grafts:  Left internal mammary artery to left    anterior descending, saphenous vein graft to diagonal, saphenous vein graft    to obtuse marginal.  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  Tiffany Caldwell presented to cardiology with severe pain similar to Tiffany Caldwell previous pain.  Tiffany Caldwell was given a treadmill test, which was positive for chest discomfort and ST segment elevation.  Tiffany Caldwell was brought into the hospital for cardiac catheterization on May 28, 2000.  HOSPITAL COURSE:  During the cardiac catheterization, there was an acute dissection of the LAD and left main.  Tiffany Caldwell remained stable and comfortable.  CVTS was consulted.  Gwenith Daily Tyrone Sage, M.D., evaluated Tiffany Caldwell and recommended emergency CABG.  He discussed the indications, risks, benefits, details, and  alternatives of surgery with Tiffany Caldwell and it was agreed to proceed.  The operating room was prepared and Tiffany Caldwell was brought to the operating room for the procedure.  There were no complications. Tiffany Caldwell tolerated the procedure well and was taken to the SICU in stable condition.  On postoperative day #1, Tiffany Caldwell was noted to be progressing well.  Tiffany Caldwell was deemed suitable to transfer to unit 2000.  On postoperative day #2, Tiffany Caldwell was noted to again doing well and making good progress.  Tiffany Caldwell remained in the unit secondary to no bed on 2000.  On postoperative day #3, Tiffany Caldwell was afebrile.  The vital signs were stable.  Tiffany Caldwell was in sinus rhythm.  The physical exam was satisfactory.  Tiffany Caldwell had a brief run of SVT, asymptomatic with a rate between 130-150.  Tiffany Caldwell Lopressor dose was increased. Tiffany Caldwell said that Tiffany Caldwell feels well.  Tiffany Caldwell is ambulating with no problems.  Tiffany Caldwell is eating and drinking well.  Pending satisfactory morning rounds, Tiffany Caldwell will be discharged home on Monday, June 01, 2000.  DISCHARGE MEDICATIONS: 1. Lopressor 50 mg one half tablet p.o. q.12h. 2. Lasix 40 mg one p.o. q.d. x 5 days. 3. Kay-Ciel 20 mEq  one p.o. q.d. x 5 days. 4. Zocor 10 mg one p.o. q.d. 5. Tylox one to two p.o. q.4-6h. p.r.n. for pain. 6. Fosamax.  The patient has a supply at home to take as before admission. 7. Enteric-coated aspirin 325 mg one p.o. q.d.  ALLERGIES:  No known drug allergies.  ACTIVITY:  Tiffany Caldwell is to do no driving.  No lifting more than 10 pounds. No strenuous activity.  Tiffany Caldwell is to walk daily.  Tiffany Caldwell is to use Tiffany Caldwell incentive spirometer daily.  DIET:  Tiffany Caldwell is to maintain a low-fat, low-salt diet.  WOUND CARE:  Tiffany Caldwell can shower.  Tiffany Caldwell is to keep Tiffany Caldwell wounds clean and dry.  Tiffany Caldwell is to use soap and water only.  To call the office if Tiffany Caldwell notices anything unusual with Tiffany Caldwell wounds.  FOLLOW-UP:  1. Tiffany Caldwell is to get a chest x-ray when Tiffany Caldwell sees Francisca December, M.D., in two weeks and to bring it with Tiffany Caldwell when  Tiffany Caldwell sees Alleen Borne, M.D.  2. Tiffany Caldwell will see Francisca December, M.D., in two weeks.  Tiffany Caldwell is to call an arrange this appointment.  3. Tiffany Caldwell will see Alleen Borne, M.D., in around three weeks.  The office will call Tiffany Caldwell with the appointment day and time. DD:  05/31/00 TD:  06/01/00 Job: [id number]

## 2011-02-21 NOTE — Op Note (Signed)
NAME:  Tiffany Caldwell, Tiffany Caldwell                       ACCOUNT NO.:  [id number]   MEDICAL RECORD NO.:  [id number]                   PATIENT TYPE:  OIB   LOCATION:  2550                                 FACILITY:  [facility]   PHYSICIAN:  Alfredia Ferguson, M.D.               DATE OF BIRTH:  1941/05/08   DATE OF PROCEDURE:  02/02/2004  DATE OF DISCHARGE:                                 OPERATIVE REPORT   PREOPERATIVE DIAGNOSES:  1. Recurrent left breast carcinoma.  2. Acquired absence of left breast.   POSTOPERATIVE DIAGNOSES:  1. Recurrent left breast carcinoma.  2. Acquired absence of left breast.   OPERATION PERFORMED:  Immediate breast reconstruction with right  contralateral transverse rectus abdominis myocutaneous flap for left breast  reconstruction.   SURGEON:  Alfredia Ferguson, M.D.   FIRST ASSISTANT:  Vevelyn Francois, R.N.F.A.   ANESTHESIA:  General endotracheal anesthesia.   INDICATION FOR SURGERY:  This is a 70 year old woman who was recently  diagnosed with recurrent left breast carcinoma.  The patient has been  counseled that she will need to undergo a mastectomy.  She has opted to  undergo immediate reconstruction with a transverse rectus abdominis  myocutaneous flap.  Because the patient has previously had open heart  surgery with the use of her left internal mammary artery, I will be using a  contralateral rectus muscle.  The patient understands the risks of this  surgery, including bleeding, infection, hematoma, seroma, asymmetry, a  partial or complete loss of the flap, the need for secondary surgery, fat  necrosis, abdominal wound healing difficulties, abdominal hernias, seromas,  hematomas, unsightly scarring, numbness to the skin, loss of the umbilicus,  and overall dissatisfaction with the results.  In spite of these and other  risks discussed, the patient wishes to proceed with the operation.   DESCRIPTION OF SURGERY:  On the day prior to surgery, skin marks were  placed  outlining the dimensions of the skin flap.  Upon completion of the  mastectomy today, I was summoned to the operating room.  A tunnel was begun  in the medial inferior aspect of the mastectomy defect and dissected down  approximately 10 cm, reaching the upper portion of the right rectus fascia.  A 10 mm Blake drain was placed in the lateral axillary gutter of the breast.  A circular incision was made around the umbilicus, and the umbilical stalk  was dissected away from the skin flap leaving a generous cuff of fat to  ensure vascular integrity.  The upper portion of the marked skin flap was  incised and the abdominal flap was elevated to the costal margins  bilaterally and the xiphoid in the midline.  This dissection connected with  the tunnel, which had already been placed to the mastectomy.  The patient's  back was elevated to approximately 30 degrees to assure that I could get  closure of the incision without tension.  Once I was certain of this, the  patient was replaced in a supine position and the lower skin paddle incision  was made.  This was deepened until reaching the anterior abdominal wall  fascia.  The left-sided skin paddle was elevated off the anterior rectus  fascia from lateral to medial until crossing the midline 1 cm to the right  of midline.  The right lateral corner of the skin paddle was elevated off  the inferior abdominal wall fascia until reaching the rectus fascia.  It was  dissected a point approximately 3 cm medial to the lateral rectus border.  The lateral rectus perforators were preserved.  Two parallel incisions were  made beginning at the costal margins with the incisions being 2 cm apart.  The two incisions were carried inferiorly through the anterior rectus fascia  until reaching the skin paddle.  The medial rectus incision continued  medially, skirting along the skin paddle's connection to the rectus fascia.  The lateral rectus incision was carried  inferiorly, skirting along the  lateral skin paddle connection to the rectus fascia.  These two incisions  met at the inferior portion of the skin paddle connection to the rectus  fascia.  The rectus fascia was now carefully dissected off the anterior  rectus muscle, preserving the central strip.  Care was taken dissecting  around the tendinous inscriptions.  The rectus muscle was now dissected off  the posterior rectus fascia.  This completely freed the rectus muscle with  the exception of its origin and insertion.  Inferiorly the deep inferior  epigastric vessels were visualized.  The artery was dissected and divided  between Hemoclips.  The two veins were dissected and divided between  Hemoclips.  The muscle was divided at the arcuate line.  The recipient site  was irrigated with warm saline irrigation and inspected for hemostasis.  Once assured, the muscle and skin paddle was tunneled to the mastectomy  defect.  I had to remove most of zone 4 because it did have a bluish  discoloration.  The flap was temporarily stapled in position and closure of  the abdominal wound begun.  The wound was first copiously irrigated with  warm saline irrigation and hemostasis was meticulously accomplished.  The  anterior and posterior rectus fascia was closed with multiple interrupted  buried figure-of-eight 0 Prolene sutures.  Marlex mesh in a rectangular  piece was placed over the rectus closure as an onlay graft, fixing it in  place with a running 2-0 Prolene suture.  An opening in the Marlex mesh was  made and the umbilicus was brought through this opening.  A single 10 mm  Blake drain was placed in the lower abdomen and brought out through a  separate stab incision.  Two catheters of a pain pump were placed in the  wound and brought out through a separate stab incision.  The patient was  placed in a semi-Fowler position with the back elevated to 30 degrees and the knees flexed.  Closure was commenced  by uniting the midline of the  abdominal incision with a 2-0 Vicryl suture.  A mark for the new location  for the umbilicus was made, and this mark was incised.  The abdominal wound  was temporarily stapled.  The umbilicus was brought through the new opening  and fixed in position using multiple interrupted 3-0 Vicryl suture.  The  abdominal wound was closed with a combination of interrupted 2-0 Vicryl  suture for the dermis and  3-0 Monocryl for the dermis, followed by a running  3-0 Monocryl subcuticular.  Attention was now directed to the TRAM flap at  the recipient site.  There was still redundant tissue, which was debrided.  The flap began to pink up very nicely.  It was placed in the desired  position and marked for the amount of skin paddle, which was below the  superior and inferior breast flaps.  That part of the skin paddle was de-  epithelialized.  The breast flap was suspended at the superior limits of the  mastectomy dissection with multiple interrupted 3-0 Vicryl sutures.  The  skin flaps of the breast were now united with the cut edge of the skin flaps  with interrupted 3-0 Monocryl sutures, followed by running 3-0 Monocryl  subcuticular.  Symmetry appeared to be acceptable.  The flap had excellent  color with good capillary refill.  The patient's chest and abdomen were  cleansed, dried, and dressings were placed in the abdominal wound and the  breast.  Estimated blood loss was 200 mL.  The patient was awakened,  extubated, and transported to the recovery room in satisfactory condition.                                               Alfredia Ferguson, M.D.    WBB/MEDQ  D:  02/02/2004  T:  02/02/2004  Job:  [id number]

## 2011-02-21 NOTE — Cardiovascular Report (Signed)
[place]. [facility]  Patient:    Tiffany Caldwell, Tiffany Caldwell                    MRN: [id number] Adm. Date:  [id number] Attending:  Cleatrice Burke CC:         Dr. Pamala Duffel                        Cardiac Catheterization  INDICATION FOR PROCEDURE:  Chest pain with reversible ischemia on the Cardiolite.  DESCRIPTION OF PROCEDURE:  After obtaining written informed consent, the patient was brought to the cardiac catheterization lab in a post-absorptive state.  Preop sedation was achieved using IV Versed and IV Benadryl.  The right femoral head was identified using radiographic technique.  The right groin was prepped and draped in the usual sterile fashion.  A 6-French hemostasis sheath was placed into the right femoral artery using a modified Seldinger technique.  Selective coronary angiography was performed using JL4 and JL4 Judkins catheters.  Left heart pressures were obtained. Ventriculogram was not performed.  All catheter exchanges were made over a guidewire.  A hemostasis sheath was flush following each injection.  FINDINGS:  The Ao pressure was 151/24; LV pressure 151/64.  No gradient noted on pullback.  Coronary angiography: 1. The left main coronary artery bifurcated into the left anterior descending    and circumflex vessel.  There was no significant disease in the left main    coronary artery. 2. Left anterior descending:  Left anterior descending gave rise to a large    septal perforator, small-to-moderate D-1, small-to-moderate D-2, small D-3,    small D-4 and ended as an apical recurrent branch.  There was 80-90%    in-stent stenosis noted. 3. Circumflex vessel:  The circumflex vessel gave rise to a moderate OM-1 and    ended as an A-V groove vessel.  There was no significant disease in the    circumflex vessel. 4. Right coronary artery:  The right coronary artery was dominant and was a    moderate-size vessel.  There was a 30-40% proximal lesion in  the right    coronary artery.  Significant spasm was noted with engagement of the right    coronary artery, as was noted during her last catheterization.  IMPRESSION 1. Critical disease involving in-stent stenosis of the proximal left anterior    descending, reversible ischemia on the Cardiolite. 2. Normal left heart pressures.  RECOMMENDATIONS:  The films will be reviewed by Dr. Francisca December.  The hemostasis sheath was sewn into place and the patient was transferred to the holding area until further decision could be made regarding her stenotic lesion. DD:  05/28/00 TD:  05/29/00 Job: [id number]

## 2011-02-21 NOTE — Discharge Summary (Signed)
NAME:  Tiffany Caldwell, Tiffany Caldwell                       ACCOUNT NO.:  [id number]   MEDICAL RECORD NO.:  [id number]                   PATIENT TYPE:  OIB   LOCATION:  5732                                 FACILITY:  [facility]   PHYSICIAN:  Alfredia Ferguson, M.D.               DATE OF BIRTH:  11-Jan-1941   DATE OF ADMISSION:  02/02/2004  DATE OF DISCHARGE:  02/04/2004                                 DISCHARGE SUMMARY   ADMISSION DIAGNOSIS:  Recurrent left breast carcinoma.   DISCHARGE DIAGNOSIS:  Recurrent left breast carcinoma.   OPERATION PERFORMED:  Left total mastectomy with immediate reconstruction  using right transverse rectus abdominis myocutaneous flap.   CHIEF COMPLAINT:  My breast cancer is back.   HISTORY OF PRESENT ILLNESS:  This is a 70 year old woman who recently had  breast cancer in 1990 and underwent a lumpectomy, axillary lymph node  dissection and radiation therapy.  The patient recently had an area of  suspicion on her mammogram which revealed ductal carcinoma in-situ.  She has  opted to undergo left total mastectomy with TRAM reconstruction.   PAST MEDICAL HISTORY:  Is significant for:  1. Coronary artery disease.  2. Patient also has a history of multiple breast biopsies.   PAST SURGICAL HISTORY:  Includes coronary artery bypass graft in 2001.   REVIEW OF SYSTEMS:  Patient does have osteoarthritis and some impaired  vision which is correctable with lenses.   MEDICATIONS INCLUDE:  Zocor 20 mg q day, calcium, glucosamine,  multivitamins, aspirin.   ADMISSION LABORATORY VALUES:  Include the CBC with a hemoglobin of 14.2,  hematocrit of 41, white count of 4,400 and chemistries are completely  normal.  Urinalysis is normal.  X-ray revealed mild changes in the lungs  consistent with early COPD.  EKG reveals nonspecific T-wave abnormality with  left ventricular hypertrophy and left atrial enlargement.   PHYSICAL EXAM:  Please see Admission H&P for complete physical exam.   HOSPITAL COURSE:  On the day of admission the patient was taken to the OR  where she underwent left total mastectomy.  She also underwent contralateral  transverse rectus abdominis myocutaneous flap.  Postoperative course was  relatively uneventful.  She did have a period of time with the flap was  venous congested but that resolved within about four hours after surgery.  The patient was started on a diet on the night of surgery.  Her diet was  advanced to a regular diet that evening.  She was able to get out of bed the  following day and ambulate with assistance.  Drainage has been as expected  with thin serosanguineous output.  The patient had one episode of fever on  the second postoperative day.  This was felt to be secondary to atelectasis.  She was treated with incentive spirometer.  At the time of discharge all  dressings had been changed.  The TRAM flap is soft, warm and appears  to be  in good condition.  The abdominal wound is noted to have some purpura just  above the abdominal incision in the midline.  This may represent either  epidermolysis or full thickness skin loss. This will be observed.   DISCHARGE MEDICATIONS INCLUDE:  1. Vicodin.  2. Keflex.   Discharge instructions were provided to the patient.  She understands how to  empty and record drain output.  Followup will be provided in approximately  five days in Dr. Derek Jack office.  The patient was told that if her drainage  drops below 30 mL/24 hours she may come in before then to have the drains  removed.  The patient was advised to use the incentive spirometer at home to  prevent atelectasis.  Discharge questions were answered for the patient.  She understands her instruction and is willing to comply.                                                Alfredia Ferguson, M.D.    Jeani Hawking  D:  02/04/2004  T:  02/04/2004  Job:  [id number]

## 2011-02-21 NOTE — Cardiovascular Report (Signed)
[place]. [facility]  Patient:    Tiffany Caldwell, Tiffany Caldwell                      MRN: [id number] Proc. Date: 12/16/99 Attending:  Meade Maw, M.D.                        Cardiac Catheterization  PROCEDURE PERFORMED:  Left heart catheterization, coronary angiography, single-  plane ventriculogram.  INDICATION FOR PROCEDURE:  Chest pain and reversible ischemia on a Cardiolite.  PROCEDURE:  After obtaining written informed consent, the patient was brought to the cardiac catheterization laboratory in a post-absorptive state.  Preop sedation was achieved using IV Versed and IV fentanyl.  The right groin was prepped and draped in the usual sterile fashion.  Local anesthesia was achieved, using 1% Xylocaine.  A #6 French hemostasis sheath was placed into the right femoral using the modified Seldinger technique.  Selective coronary angiography was performed  using a JL4/JR4 Judkins catheter.  Non-ionic contrast was used and was hand injected.  Single plane ventriculogram was performed in the RAO position using #6 French catheter.  Non-ionic contrast was used and powered injected.  All catheter exchanges were made over a guidewire.  The hemostasis sheath was flushed after ach catheter exchange.  Following obtainment of the films, the films were reviewed ith Dr. Katrinka Blazing and it was felt that intervention on the LAD was indicated.  FINDINGS:  The aortic pressures were 148/71.  LV pressure was 139/14.  Single plane ventriculogram:  Revealed normal wall motion, with an ejection fraction of 65%.  There was no mitral regurgitation that appeared.  Coronary angiography:  The left main coronary artery was long-bifurcated into the left anterior descending and circumflex vessel.  There was no significant disease in the left main coronary artery.  Left anterior descending:  The left anterior descending gave rise to a large septal perforator, 1) small to moderate D1, small  D2, and a small D3.  There was an 80-90% lesion in mid-LAD at the takeoff of the small second diagonal.  Circumflex vessel:  The circumflex vessel gave rise to a moderate OM-1 in the _____ A-V groove vessel.  There was no significant disease in the circumflex vessel.  Right coronary artery:  The right coronary artery was dominant.  There was initial spasm with engagement.  It gave rise to small RV marginals and moderate-size PDA and a large PL branch.  There was 30% proximal disease in the right coronary artery.  IMPRESSION: 1. Critical disease, involving the mid-LAD. 2. Preserved left ventricular function.  RECOMMENDATIONS:  Dr. Katrinka Blazing was consulted and will proceed with the angioplasty on the LAD. DD:  12/16/99 TD:  12/16/99 Job: [id number]

## 2011-02-21 NOTE — Cardiovascular Report (Signed)
. [facility]  Patient:    Tiffany Caldwell, Tiffany Caldwell                    MRN: [id number] Proc. Date: 05/28/00 Adm. Date:  [id number] Attending:  Cleatrice Burke CC:         Celso Sickle, M.D.  Meade Maw, M.D.  Alleen Borne, M.D.  [facility]   Cardiac Catheterization  PROCEDURES PERFORMED 1. Percutaneous coronary intervention/stent implantation, mid and proximal    left anterior descending artery. 2. Intravascular ultrasound.  INDICATIONS:  Ms. Taesha Goodell is a 70 year old woman who is now five months status post PTCA and stent implantation of the midportion of the left anterior descending artery by Dr. Darci Needle.  Two days ago, she redeveloped her typical anginal syndrome.  She underwent an exercise treadmill test yesterday which was positive for the reproduction of chest discomfort and ST segment elevation.  She underwent coronary angiography by Dr. Meade Maw earlier today which documented diffuse in-stent restenosis.  The greatest degree of stenosis is approximately 80-90%.  She is to undergo percutaneous coronary intervention at this time.  PROCEDURAL NOTE:  The previously placed 6-French catheter sheath was exchanged over a long guiding J wire for a 7-French catheter sheath after the right groin was prepped and draped in the usual sterile fashion.  Local anesthesia was obtained with infiltration with 1% lidocaine.  A 7-French FL4 Sci-Med Wis-Guide guiding catheter was advanced to the ascending aorta where the left coronary os was engaged.  A 0.014-inch Sci-Med luge intracoronary guidewire was passed across the lesion without difficulty.  Initial balloon dilatations were performed using a 3.25/10.0-mm cutting balloon, inflated to a maximum of 8 atmospheres on four different occasions.  Intravascular ultrasound was then performed using an Atlantis catheter.  It showed extensive  residual cellular debris in the distal portion of the stent; the stent, however, was adequately deployed.  A 3.25/15.0-mm Sci-Med [place] Quantum Ranger was chosen and deployed into the stent and inflated to 12 atmospheres.  There was a good angiographic result.  Intravascular ultrasound was repeated, showing good ultrasound result as well.  Preparations were then made for discontinuing the procedure; however, during angiography in orthogonal views, a proximal dissection, which was significantly obstructive, of the LAD was noted.  The wire was redeployed across the lesion and a 3.5/12.0-mm Sci-Med NIR Elite was deployed in the proximal portion of the LAD, with care taken to avoid covering the ostium of the circumflex.  This resulted in wide patency of the LAD; however, there was retrograde movement of the dissection into the distal portion of the left main.  The lesion was observed over approximately 30 minutes and the degree of stenosis increased from trivial to approximately 50%.  At no time was the orifice or left circumflex compromised.  The hematoma was on the opposite wall.  There was adequate patency of the anterior descending and TIMI grade 3 flow in both vessels.  The patient remained hemodynamically stable and without any evidence of ECG changes or chest discomfort.  Cardiothoracic surgery was consulted and the decision was made to proceed with urgency coronary artery bypass grafting, due to the unpredictable nature of this lesion.  These findings were discussed with the patient and her husband and they were in agreement with the decision to proceed with coronary bypass surgery.  ANGIOGRAPHIC RESULTS:  As noted above, there was wide patency in the anterior descending artery  throughout the proximal and midportion, with perhaps a residual 30% stenosis at the distal portion of the originally placed stent at completion; however, the left main was compromised 50% that remained stable and  unchanging over approximately one hour of observation.  INTRAVASCULAR ULTRASOUND RESULTS:  Following the [place] Quantum Ranger dilatation and second ultrasound, the stent was found to be again widely patent.  There was good apposition of the stent struts and a 3.2 x 3.2-mm lumen throughout that segment.  Because of the unstable nature of the lesion following the placement of the proximal LAD stent, the decision was made not to return the ultrasound catheter to the artery.  FINAL IMPRESSION 1. Atherosclerotic coronary vascular disease, one vessel. 2. Acute partially occlusive intimal dissection of the proximal anterior    descending, with retrograde extension into the left main. 3. Status post successful percutaneous transluminal coronary angioplasty and    stent implantation, proximal left anterior descending.  PLAN/RECOMMENDATION:  As above, will proceed with semi-urgent coronary bypass surgery; the operating room is currently being readied.  Patient remains comfortable and hemodynamically stable.  She will be transferred as soon as possible. DD:  05/28/00 TD:  05/29/00 Job: [id number]

## 2011-03-07 ENCOUNTER — Encounter (INDEPENDENT_AMBULATORY_CARE_PROVIDER_SITE_OTHER): Payer: Self-pay | Admitting: Surgery

## 2011-06-27 ENCOUNTER — Ambulatory Visit (INDEPENDENT_AMBULATORY_CARE_PROVIDER_SITE_OTHER): Payer: Self-pay | Admitting: Surgery

## 2011-07-25 ENCOUNTER — Encounter (INDEPENDENT_AMBULATORY_CARE_PROVIDER_SITE_OTHER): Payer: Self-pay | Admitting: General Surgery

## 2011-07-25 ENCOUNTER — Encounter (INDEPENDENT_AMBULATORY_CARE_PROVIDER_SITE_OTHER): Payer: Self-pay | Admitting: Surgery

## 2011-07-25 DIAGNOSIS — Z853 Personal history of malignant neoplasm of breast: Secondary | ICD-10-CM | POA: Insufficient documentation

## 2011-07-25 HISTORY — DX: Personal history of malignant neoplasm of breast: Z85.3

## 2011-08-01 ENCOUNTER — Encounter (INDEPENDENT_AMBULATORY_CARE_PROVIDER_SITE_OTHER): Payer: Self-pay | Admitting: Surgery

## 2011-08-01 ENCOUNTER — Ambulatory Visit (INDEPENDENT_AMBULATORY_CARE_PROVIDER_SITE_OTHER): Payer: No Typology Code available for payment source | Admitting: Surgery

## 2011-08-01 VITALS — BP 122/68 | HR 78 | Temp 98.8°F | Ht 66.5 in | Wt 152.2 lb

## 2011-08-01 DIAGNOSIS — Z853 Personal history of malignant neoplasm of breast: Secondary | ICD-10-CM

## 2011-08-01 DIAGNOSIS — K8020 Calculus of gallbladder without cholecystitis without obstruction: Secondary | ICD-10-CM

## 2011-08-01 NOTE — Patient Instructions (Signed)
See me again in a year for breast cancer follow up.  If you have more abdominal symptoms think some more about having your gall bladder removed

## 2011-08-01 NOTE — Progress Notes (Signed)
NAME: Tiffany Caldwell       DOB: 1941/03/18           DATE: 08/01/2011       MRN: [id number]   Tiffany Caldwell is a 70 y.o.Marland Kitchenfemale who presents for routine followup of her Left breast cancer diagnosed in 2005 and treated with lumpectomy then with local recurrence had mastectomy and TRAM reconstruction. She has no problems or concerns on either side.  PFSH: She has had no significant changes since the last visit here.  ROS: There have been no significant changes since the last visit here. She continues to have some abd pain, now more RUQ and postprandial  EXAM: General: The patient is alert, oriented, generally healty appearing, NAD. Mood and affect are normal.  Breasts:  Right breast is normal and the left is post tram. No abnormality on either side. She had noticed something on the left near the edge of the pectoralis, but can't find it today  Lymphatics: She has no axillary or supraclavicular adenopathy on either side.  Extremities: Full ROM of the surgical side with no lymphedema noted.   Abd; WNL with no tenderness or mass Data Reviewed: Mammogram OK  Impression: Doing well, with no evidence of recurrent cancer or new cancer Gallstone minimally symptomatic  Plan: Will continue to follow up on an annual basis here. She does not wish cholecystectomy but will come back if sx get worse

## 2012-03-10 ENCOUNTER — Other Ambulatory Visit: Payer: Self-pay | Admitting: *Deleted

## 2012-03-10 MED ORDER — ESOMEPRAZOLE MAGNESIUM 10 MG PO PACK: 10.0000 mg | PACK | Freq: Every day | ORAL | Status: DC

## 2012-03-15 ENCOUNTER — Telehealth: Payer: Self-pay | Admitting: Gastroenterology

## 2012-03-16 MED ORDER — ESOMEPRAZOLE MAGNESIUM 40 MG PO CPDR: 40.0000 mg | DELAYED_RELEASE_CAPSULE | Freq: Every day | ORAL | Status: AC

## 2012-03-16 NOTE — Telephone Encounter (Signed)
Nexium 10mg  packets were refilled bc that is the rx that was requested from the patients pharmacy and already in the chart from the last rx, patient states that it is an error I have corrected and sent new rx.

## 2012-07-22 ENCOUNTER — Encounter: Payer: Self-pay | Admitting: Gastroenterology

## 2012-07-22 ENCOUNTER — Ambulatory Visit (INDEPENDENT_AMBULATORY_CARE_PROVIDER_SITE_OTHER): Payer: No Typology Code available for payment source | Admitting: Surgery

## 2012-07-22 ENCOUNTER — Encounter (INDEPENDENT_AMBULATORY_CARE_PROVIDER_SITE_OTHER): Payer: Self-pay | Admitting: Surgery

## 2012-07-22 VITALS — BP 116/75 | HR 77 | Temp 98.6°F | Resp 18 | Ht 66.5 in | Wt 148.0 lb

## 2012-07-22 DIAGNOSIS — Z853 Personal history of malignant neoplasm of breast: Secondary | ICD-10-CM

## 2012-07-22 NOTE — Progress Notes (Signed)
NAME: Tiffany Caldwell       DOB: 02/24/1941           DATE: 07/22/2012       MRN: [id number]   Tiffany Caldwell is a 71 y.o.Marland Kitchenfemale who presents for routine followup of her Left breast cancer diagnosed in 2005 and treated with lumpectomy then with local recurrence had mastectomy and TRAM reconstruction. She has no problems or concerns on either side.  PFSH: She has had no significant changes since the last visit here.  ROS: There have been no significant changes since the last visit here. She continues to have some abd pain, now more RUQ and postprandial  EXAM: General: The patient is alert, oriented, generally healthy appearing, NAD. Mood and affect are normal.  Breasts:  Right breast is normal and the left is post tram. No abnormality on either side.  Lymphatics: She has no axillary or supraclavicular adenopathy on either side.  Extremities: Full ROM of the surgical side with no lymphedema noted.   Abd; WNL with no tenderness or mass Data Reviewed: Mammogram OK don 10/11-13  Impression: Doing well, with no evidence of recurrent cancer or new cancer Gallstone minimally symptomatic  Plan: RTC PRN; will follow up with Dr Ermelinda Das She is still deferring any GB surgery

## 2012-07-22 NOTE — Patient Instructions (Signed)
Continued annual mammograms. Come back to see me again if any problems develop.

## 2012-07-23 ENCOUNTER — Encounter: Payer: Self-pay | Admitting: Gastroenterology

## 2012-08-31 ENCOUNTER — Ambulatory Visit (AMBULATORY_SURGERY_CENTER): Payer: Medicare Other

## 2012-08-31 VITALS — Ht 66.5 in | Wt 144.4 lb

## 2012-08-31 DIAGNOSIS — Z800 Family history of malignant neoplasm of digestive organs: Secondary | ICD-10-CM

## 2012-08-31 DIAGNOSIS — Z1211 Encounter for screening for malignant neoplasm of colon: Secondary | ICD-10-CM

## 2012-08-31 MED ORDER — MOVIPREP 100 G PO SOLR: ORAL | Status: DC

## 2012-09-08 ENCOUNTER — Encounter: Payer: Self-pay | Admitting: Gastroenterology

## 2012-09-08 ENCOUNTER — Ambulatory Visit (AMBULATORY_SURGERY_CENTER): Payer: Medicare Other | Admitting: Gastroenterology

## 2012-09-08 VITALS — BP 147/74 | HR 72 | Temp 97.9°F | Resp 21 | Ht 67.0 in | Wt 144.0 lb

## 2012-09-08 DIAGNOSIS — D126 Benign neoplasm of colon, unspecified: Secondary | ICD-10-CM

## 2012-09-08 DIAGNOSIS — Z800 Family history of malignant neoplasm of digestive organs: Secondary | ICD-10-CM

## 2012-09-08 DIAGNOSIS — K5730 Diverticulosis of large intestine without perforation or abscess without bleeding: Secondary | ICD-10-CM

## 2012-09-08 DIAGNOSIS — K644 Residual hemorrhoidal skin tags: Secondary | ICD-10-CM

## 2012-09-08 DIAGNOSIS — Z1211 Encounter for screening for malignant neoplasm of colon: Secondary | ICD-10-CM

## 2012-09-08 MED ORDER — SODIUM CHLORIDE 0.9 % IV SOLN: 500.0000 mL | INTRAVENOUS | Status: DC

## 2012-09-08 NOTE — Patient Instructions (Addendum)

## 2012-09-08 NOTE — Op Note (Signed)
[facility] [address] [place], [address]   COLONOSCOPY PROCEDURE REPORT  PATIENT: Tiffany Caldwell, Tiffany Caldwell  MR#: [id number] BIRTHDATE: [birth year] , 71  yrs. old GENDER: Female ENDOSCOPIST: Mardella Layman, MD, [facility] REFERRED BY: PROCEDURE DATE:  09/08/2012 PROCEDURE:   Colonoscopy with biopsy ASA CLASS:   Class II INDICATIONS:Patient's immediate family history of colon cancer. MEDICATIONS: propofol (Diprivan) 200mg  IV  DESCRIPTION OF PROCEDURE:   After the risks and benefits and of the procedure were explained, informed consent was obtained.  A digital rectal exam revealed no abnormalities of the rectum.    The LB CF-[id number]  endoscope was introduced through the anus and advanced to the cecum, which was identified by both the appendix and ileocecal valve .  The quality of the prep was poor, using MoviPrep .  The instrument was then slowly withdrawn as the colon was fully examined.     COLON FINDINGS: Moderate diverticulosis was noted in the descending colon and sigmoid colon. Very redundant and tortuous colon noted,poorprep.  A normal appearing cecum, ileocecal valve, and appendiceal orifice were identified.  The ascending, hepatic flexure, transverse, splenic flexure, descending, sigmoid colon and rectum appeared unremarkable.  No polyps or cancers were seen.   A diminutive sessile polyp was found in the rectum.  A biopsy was performed using cold forceps.     Retroflexion was not performed. The scope was then withdrawn from the patient and the procedure completed.  COMPLICATIONS: There were no complications. ENDOSCOPIC IMPRESSION: 1.   Moderate diverticulosis was noted in the descending colon and sigmoid colon 2.   Normal colon,redundant and tortuous colon anatomically noted. 3.   Diminutive sessile polyp was found in the rectum; biopsy was performed using cold forceps  RECOMMENDATIONS: 1.  Await pathology results 2.  Given your significant  family history of colon cancer, you should have a repeat colonoscopy in 5 years 3.  High fiber diet   REPEAT EXAM:  XB:JYNW Valentina Lucks, MD  _______________________________ eSigned:  Mardella Layman, MD, [facility] 09/08/2012 10:54 AM

## 2012-09-08 NOTE — Progress Notes (Signed)
Patient did not have preoperative order for IV antibiotic SSI prophylaxis. (G8918) Patient did not experience any of the following events: a burn prior to discharge; a fall within the facility; wrong site/side/patient/procedure/implant event; or a hospital transfer or hospital admission upon discharge from the facility. (G8907)   Charted by April Mirts RN 

## 2012-09-09 ENCOUNTER — Telehealth: Payer: Self-pay

## 2012-09-09 NOTE — Telephone Encounter (Signed)
Left message

## 2012-09-14 ENCOUNTER — Encounter: Payer: Self-pay | Admitting: Gastroenterology

## 2012-12-28 ENCOUNTER — Encounter (INDEPENDENT_AMBULATORY_CARE_PROVIDER_SITE_OTHER): Payer: Self-pay

## 2013-04-20 ENCOUNTER — Other Ambulatory Visit: Payer: Self-pay | Admitting: *Deleted

## 2013-04-20 MED ORDER — ESOMEPRAZOLE MAGNESIUM 40 MG PO CPDR: 40.0000 mg | DELAYED_RELEASE_CAPSULE | Freq: Every day | ORAL | Status: DC

## 2013-09-13 ENCOUNTER — Ambulatory Visit (INDEPENDENT_AMBULATORY_CARE_PROVIDER_SITE_OTHER): Payer: Medicare Other | Admitting: Gastroenterology

## 2013-09-13 ENCOUNTER — Encounter: Payer: Self-pay | Admitting: Gastroenterology

## 2013-09-13 VITALS — BP 102/62 | HR 74 | Ht 64.75 in | Wt 144.0 lb

## 2013-09-13 DIAGNOSIS — K219 Gastro-esophageal reflux disease without esophagitis: Secondary | ICD-10-CM

## 2013-09-13 DIAGNOSIS — Z800 Family history of malignant neoplasm of digestive organs: Secondary | ICD-10-CM

## 2013-09-13 MED ORDER — ESOMEPRAZOLE MAGNESIUM 40 MG PO CPDR: 40.0000 mg | DELAYED_RELEASE_CAPSULE | Freq: Every day | ORAL | Status: DC

## 2013-09-13 NOTE — Patient Instructions (Signed)
Please follow up in one year  Refill of Nexium was sent to your pharmacy

## 2013-09-13 NOTE — Progress Notes (Signed)
This is a 72 year old Caucasian female has periodic acid reflux managed usually by taking Nexium 40 mg a day for months.  She then stopped her medication and has 3 months of no acid reflux, then we'll repeat this cycle.  She denies dysphagia, current acid reflux symptoms, or any hepatobiliary symptoms.  She has a son has had colon cancer, but she's had negative colonoscopies, and last exam was December 2013.  She denies a lower gastrointestinal symptoms such as melena, hematochezia, change in bowel habits, abdominal pain.  Her appetite is good her weight is stable.  She has not been asymptomatic gallstones, has been evaluated previously by Dr. Jamey Ripa in surgery.  Current Medications, Allergies, Past Medical History, Past Surgical History, Family History and Social History were reviewed in [employer] record.  ROS: All systems were reviewed and are negative unless otherwise stated in the HPI.          Physical Exam: Blood pressure 102/62, pulse 74 and regular and weight under and 44 pounds.  I cannot appreciate stigmata of chronic liver disease.  Chest is clear and she is in a regular rhythm without murmurs gallops or rubs.  There is no organomegaly, abdominal masses or tenderness.  Bowel sounds are normal.  Peripheral extremities are unremarkable.  Mental status is normal.    Assessment and Plan: Review of previous endoscopy showed no large hiatal hernia or evidence of Barrett's mucosa.  Her acid reflux is managed with her current regime of periodic use of PPI therapy, and I have renewed her Nexium and reviewed antireflux maneuvers with the patient.  I have recommended every five-year colonoscopy because of her family history,and that she had a very tortuous and redundant colon on colonoscopy exam.  Blood work per her family care physician apparently has been normal.   CC: Dr, Kirby Funk

## 2013-11-14 ENCOUNTER — Other Ambulatory Visit: Payer: Self-pay | Admitting: Nurse Practitioner

## 2013-11-14 ENCOUNTER — Ambulatory Visit
Admission: RE | Admit: 2013-11-14 | Discharge: 2013-11-14 | Disposition: A | Discharge status: Home / Self Care | Payer: Medicare Other | Source: Ambulatory Visit | Attending: Nurse Practitioner | Admitting: Nurse Practitioner

## 2013-11-14 DIAGNOSIS — R059 Cough, unspecified: Secondary | ICD-10-CM

## 2013-11-14 DIAGNOSIS — R05 Cough: Secondary | ICD-10-CM

## 2014-03-13 ENCOUNTER — Ambulatory Visit (INDEPENDENT_AMBULATORY_CARE_PROVIDER_SITE_OTHER): Payer: No Typology Code available for payment source | Admitting: Surgery

## 2014-03-22 ENCOUNTER — Ambulatory Visit (INDEPENDENT_AMBULATORY_CARE_PROVIDER_SITE_OTHER): Payer: No Typology Code available for payment source | Admitting: Surgery

## 2014-03-22 ENCOUNTER — Encounter (INDEPENDENT_AMBULATORY_CARE_PROVIDER_SITE_OTHER): Payer: Self-pay | Admitting: Surgery

## 2014-03-22 VITALS — BP 124/83 | HR 78 | Temp 98.1°F | Resp 16 | Ht 66.5 in | Wt 142.6 lb

## 2014-03-22 DIAGNOSIS — N644 Mastodynia: Secondary | ICD-10-CM

## 2014-03-22 DIAGNOSIS — Z853 Personal history of malignant neoplasm of breast: Secondary | ICD-10-CM

## 2014-03-22 NOTE — Progress Notes (Signed)
Subjective:     Patient ID: Tiffany Caldwell, female   DOB: December 21, 1940, 73 y.o.   MRN: [id number]  HPI This is a former breast cancer patient of Dr. Janee Morn who is actually also followed by Dr. Margot Chimes. She underwent a TRAM reconstruction of a left mastectomy for recurrent breast cancer in 2005. Recently, she started having pain in the left axilla going into the breast. It is an intermittent pain started several months ago. Her last mammograms October of 2014 were unremarkable.  Review of Systems     Objective:   Physical Exam On exam, her TRAM flap is well healed. There is no palpable mass at the TRAM. She has a well-healed incision in her axilla. There is almost no soft tissue in the axilla and her ribs are easily palpable. There is no palpable mass in her area tenderness.    Assessment:     Left breast pain with a history of left breast cancer     Plan:     I suspect this is either symptomatic pain from muscle strain or could be do to radiation changes. I will see her back in 3 months for reevaluation unless she can palpate a mass that developed in this area. Other than that there is nothing further to offer

## 2014-06-27 ENCOUNTER — Ambulatory Visit (INDEPENDENT_AMBULATORY_CARE_PROVIDER_SITE_OTHER): Payer: No Typology Code available for payment source | Admitting: Surgery

## 2014-10-03 ENCOUNTER — Ambulatory Visit
Admission: RE | Admit: 2014-10-03 | Discharge: 2014-10-03 | Disposition: A | Discharge status: Home / Self Care | Payer: Medicare Other | Source: Ambulatory Visit | Attending: Internal Medicine | Admitting: Internal Medicine

## 2014-10-03 ENCOUNTER — Other Ambulatory Visit: Payer: Self-pay | Admitting: Internal Medicine

## 2014-10-03 DIAGNOSIS — R509 Fever, unspecified: Secondary | ICD-10-CM

## 2014-10-06 DIAGNOSIS — J189 Pneumonia, unspecified organism: Secondary | ICD-10-CM | POA: Insufficient documentation

## 2014-10-06 HISTORY — DX: Pneumonia, unspecified organism: J18.9

## 2014-10-24 ENCOUNTER — Ambulatory Visit
Admission: RE | Admit: 2014-10-24 | Discharge: 2014-10-24 | Disposition: A | Discharge status: Home / Self Care | Payer: Medicare Other | Source: Ambulatory Visit | Attending: Internal Medicine | Admitting: Internal Medicine

## 2014-10-24 ENCOUNTER — Other Ambulatory Visit: Payer: Self-pay | Admitting: Internal Medicine

## 2014-10-24 DIAGNOSIS — J189 Pneumonia, unspecified organism: Secondary | ICD-10-CM

## 2014-11-01 ENCOUNTER — Other Ambulatory Visit: Payer: Self-pay | Admitting: *Deleted

## 2014-11-01 ENCOUNTER — Other Ambulatory Visit: Payer: Self-pay | Admitting: Internal Medicine

## 2014-11-01 DIAGNOSIS — R0789 Other chest pain: Secondary | ICD-10-CM

## 2014-11-01 DIAGNOSIS — R911 Solitary pulmonary nodule: Secondary | ICD-10-CM

## 2014-11-01 DIAGNOSIS — R002 Palpitations: Secondary | ICD-10-CM

## 2014-11-08 ENCOUNTER — Encounter: Payer: Self-pay | Admitting: *Deleted

## 2014-11-08 ENCOUNTER — Encounter (INDEPENDENT_AMBULATORY_CARE_PROVIDER_SITE_OTHER): Payer: Medicare Other

## 2014-11-08 ENCOUNTER — Ambulatory Visit (HOSPITAL_COMMUNITY): Payer: Medicare Other | Attending: Internal Medicine

## 2014-11-08 ENCOUNTER — Other Ambulatory Visit (HOSPITAL_COMMUNITY): Payer: Self-pay | Admitting: Internal Medicine

## 2014-11-08 DIAGNOSIS — R002 Palpitations: Secondary | ICD-10-CM

## 2014-11-08 DIAGNOSIS — R0789 Other chest pain: Secondary | ICD-10-CM

## 2014-11-08 NOTE — Progress Notes (Signed)
Patient ID: Tiffany Caldwell, female   DOB: [birth year], 74 y.o.   MRN: [id number] [employer] 48 hour holter monitor applied to patient.

## 2014-11-08 NOTE — Progress Notes (Signed)
2D Echo completed. 11/08/2014

## 2014-11-20 ENCOUNTER — Other Ambulatory Visit: Payer: Medicare Other

## 2014-11-27 ENCOUNTER — Ambulatory Visit
Admission: RE | Admit: 2014-11-27 | Discharge: 2014-11-27 | Disposition: A | Discharge status: Home / Self Care | Payer: Medicare Other | Source: Ambulatory Visit | Attending: Internal Medicine | Admitting: Internal Medicine

## 2014-11-27 DIAGNOSIS — R911 Solitary pulmonary nodule: Secondary | ICD-10-CM

## 2014-11-27 MED ORDER — IOHEXOL 300 MG/ML  SOLN
75.0000 mL | Freq: Once | INTRAMUSCULAR | Status: AC | PRN
  Administered 2014-11-27: 75 mL via INTRAVENOUS

## 2014-12-13 ENCOUNTER — Institutional Professional Consult (permissible substitution): Payer: Medicare Other | Admitting: Pulmonary Disease

## 2014-12-14 ENCOUNTER — Ambulatory Visit (INDEPENDENT_AMBULATORY_CARE_PROVIDER_SITE_OTHER): Payer: Medicare Other | Admitting: Pulmonary Disease

## 2014-12-14 ENCOUNTER — Encounter: Payer: Self-pay | Admitting: Pulmonary Disease

## 2014-12-14 VITALS — BP 126/62 | HR 77 | Temp 97.5°F | Ht 65.2 in | Wt 139.0 lb

## 2014-12-14 DIAGNOSIS — J449 Chronic obstructive pulmonary disease, unspecified: Secondary | ICD-10-CM | POA: Insufficient documentation

## 2014-12-14 DIAGNOSIS — R911 Solitary pulmonary nodule: Secondary | ICD-10-CM

## 2014-12-14 DIAGNOSIS — J432 Centrilobular emphysema: Secondary | ICD-10-CM

## 2014-12-14 DIAGNOSIS — R918 Other nonspecific abnormal finding of lung field: Secondary | ICD-10-CM | POA: Insufficient documentation

## 2014-12-14 NOTE — Progress Notes (Signed)
Subjective:    Patient ID: Tiffany Caldwell, female    DOB: September 17, 1941, 74 y.o.   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 She smoked about 30-pack-years until she quit in 1990. She underwent CABG in 2001 after failed angioplasty. She developed chills and left-sided chest pain in late December 2015, chest x-ray showed left lower lobe infiltrate. He was treated with Levaquin for one week with clinical improvement. Follow-up chest x-ray on 10/24/14 she suggested right upper lobe nodule hence CT chest was performed  CXR 10/03/14 showed LLL pneumonia FU CXR 10/24/14 showed resolution, but picked up 43mm  nodule in RUL. CT chest 11/27/14 showed severe emphysema, cavitary lesion within the superior segment of the right lower lobe which was not seen on the prior exam in 2009-3.7 x 2.3 cm. New 5 mm subpleural nodule in the posterior aspect of the right upper lobe. Echo 11/2014-normal LV function, dilated left atrium, RVSP 40  She denies fevers or weight loss. She reports mild dyspnea on exertion which is nonprogressive and not associated with wheezing. She denies frequent chest colds, he does take a long time to get over an episode of bronchitis  Past Medical History  Diagnosis Date  . Hypertension   . Irritable bowel syndrome (IBS)   . Arthritis   . Allergy   . Pinched nerve     back and left leg  . GERD (gastroesophageal reflux disease)   . Hyperlipidemia   . Breast cancer     left breast    Past Surgical History  Procedure Laterality Date  . Knee arthroscopy  January 2011    right  . Coronary artery bypass graft  2001  . Breast surgery  02/01/2004    tram/mastectomyfor recurrence  . Breast fibroadenoma surgery  01/13/1980  . Mastectomy partial / lumpectomy w/ axillary lymphadenectomy  04/10/1989    Dr Annamaria Boots / left breast  . Partial hysterectomy      vaginal  . Tonsillectomy and adenoidectomy    . Upper  gastrointestinal endoscopy    . Colonoscopy      Allergies  Allergen Reactions  . Amoxicillin   . Ciprofloxacin   . Levofloxacin   . Lisinopril     Fatigue   . Vioxx [Rofecoxib]   . Azithromycin Rash    History   Social History  . Marital Status: Married    Spouse Name: N/A  . Number of Children: N/A  . Years of Education: N/A   Occupational History  . Not on file.   Social History Main Topics  . Smoking status: Former Smoker    Types: Cigarettes    Quit date: 07/31/1989  . Smokeless tobacco: Never Used  . Alcohol Use: 0.6 oz/week    1 Glasses of wine per week     Comment: occasional wine  . Drug Use: No  . Sexual Activity: Not on file   Other Topics Concern  . Not on file   Social History Narrative    Family History  Problem Relation Age of Onset  . Emphysema Father     deceased  . Heart disease Mother     deceased  . Colon cancer Paternal Grandmother   . Colon cancer Son   . Colon cancer Paternal Uncle      Review of Systems Constitutional: negative for anorexia, fevers and sweats  Eyes: negative for irritation, redness and visual disturbance  Ears, nose, mouth,  throat, and face: negative for earaches, epistaxis, nasal congestion and sore throat  Respiratory: negative for cough,  sputum and wheezing  Cardiovascular: negative for chest pain, dyspnea, lower extremity edema, orthopnea, palpitations and syncope  Gastrointestinal: negative for abdominal pain, constipation, diarrhea, melena, nausea and vomiting  Genitourinary:negative for dysuria, frequency and hematuria  Hematologic/lymphatic: negative for bleeding, easy bruising and lymphadenopathy  Musculoskeletal:negative for arthralgias, muscle weakness and stiff joints  Neurological: negative for coordination problems, gait problems, headaches and weakness  Endocrine: negative for diabetic symptoms including polydipsia, polyuria and weight loss     Objective:   Physical Exam  Gen. Pleasant,  well-nourished, in no distress, normal affect ENT - no lesions, no post nasal drip Neck: No JVD, no thyromegaly, no carotid bruits Lungs: no use of accessory muscles, no dullness to percussion, clear without rales or rhonchi  Cardiovascular: Rhythm regular, heart sounds  normal, no murmurs or gallops, no peripheral edema Abdomen: soft and non-tender, no hepatosplenomegaly, BS normal. Musculoskeletal: No deformities, no cyanosis or clubbing Neuro:  alert, non focal        Assessment & Plan:

## 2014-12-14 NOTE — Assessment & Plan Note (Addendum)
CT scan nodule may be related to infection, less likely malignancy in t his ex smoker. This is more likely to be an infected bleb but it does have a solid component-that is worrisome for malignancy. Suggest , 3 month FU CT scan in may 2016

## 2014-12-14 NOTE — Patient Instructions (Addendum)
CT scan nodule may be related to infection Suggest , 3 month FU CT scan in may 2016 Breathing test showed lung capacity at 64%

## 2014-12-14 NOTE — Assessment & Plan Note (Signed)
Dyspnea stable Hold off on bronchodilators at present

## 2015-01-29 ENCOUNTER — Institutional Professional Consult (permissible substitution): Payer: Medicare Other | Admitting: Pulmonary Disease

## 2015-02-05 ENCOUNTER — Telehealth: Payer: Self-pay | Admitting: Pulmonary Disease

## 2015-02-05 NOTE — Telephone Encounter (Signed)
Spoke with pt. Advised her that [facility] does not have any openings until June 29th. Offered her an appointment with TP but she declined. ROV has been scheduled with [facility] on 04/04/15 at 2pm.

## 2015-02-27 ENCOUNTER — Ambulatory Visit (INDEPENDENT_AMBULATORY_CARE_PROVIDER_SITE_OTHER)
Admission: RE | Admit: 2015-02-27 | Discharge: 2015-02-27 | Disposition: A | Discharge status: Home / Self Care | Payer: Medicare Other | Source: Ambulatory Visit | Attending: Pulmonary Disease | Admitting: Pulmonary Disease

## 2015-02-27 ENCOUNTER — Encounter: Payer: Self-pay | Admitting: Gastroenterology

## 2015-02-27 DIAGNOSIS — R911 Solitary pulmonary nodule: Secondary | ICD-10-CM | POA: Diagnosis not present

## 2015-03-06 ENCOUNTER — Ambulatory Visit (INDEPENDENT_AMBULATORY_CARE_PROVIDER_SITE_OTHER): Payer: Medicare Other | Admitting: Pulmonary Disease

## 2015-03-06 ENCOUNTER — Encounter: Payer: Self-pay | Admitting: Pulmonary Disease

## 2015-03-06 VITALS — BP 100/68 | HR 64 | Ht 66.5 in | Wt 138.6 lb

## 2015-03-06 DIAGNOSIS — J432 Centrilobular emphysema: Secondary | ICD-10-CM | POA: Diagnosis not present

## 2015-03-06 DIAGNOSIS — R911 Solitary pulmonary nodule: Secondary | ICD-10-CM | POA: Diagnosis not present

## 2015-03-06 NOTE — Patient Instructions (Signed)
Schedule pFTs We discussed results of CT scan & possible biopsy - we will decide by Friday & let you know We also discussed PEt scan use in this setting

## 2015-03-06 NOTE — Progress Notes (Signed)
   Subjective:    Patient ID: Tiffany Caldwell, female    DOB: [birth year], 74 y.o.   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 She smoked about 30-pack-years until she quit in 1990. She underwent CABG in 2001 after failed angioplasty. She developed chills and left-sided chest pain in late December 2015, chest x-ray showed left lower lobe infiltrate. She was treated with Levaquin for one week with clinical improvement. Follow-up chest x-ray on 10/24/14 suggested right upper lobe nodule hence CT chest was performed She had breast CA in 1990 - [facility] -underwent lumpectomy  RT, then recurrence in 2005 requiring mastectomy & reconstruction  03/06/2015  Chief Complaint  Patient presents with  . solitary pulmonary nodule    CT results.    She denies fevers or weight loss. She has recovered fully from the episode of pneumonia in December 2015 She denies dyspnea on exertion or wheezing. She denies frequent chest colds, she does take a long time to get over an episode of bronchitis   Significant tests/ events  CXR 10/03/14 showed LLL pneumonia FU CXR 10/24/14 showed resolution, but picked up 42mm nodule in RUL. CT chest 11/27/14 showed severe emphysema, cavitary lesion within the superior segment of the right lower lobe which was not seen on the prior exam in 2009-3.7 x 2.3 cm. New 5 mm subpleural nodule in the posterior aspect of the right upper lobe. Echo 11/2014-normal LV function, dilated left atrium, RVSP 40  CT chest 02/27/15 >> Persistent cavitary lesion in the superior segment of the right lower lobe, Stable 5 mm pleural-based nodule   Past Medical History  Diagnosis Date  . Hypertension   . Irritable bowel syndrome (IBS)   . Arthritis   . Allergy   . Pinched nerve     back and left leg  . GERD (gastroesophageal reflux disease)   . Hyperlipidemia   . Breast cancer     left breast    Review of Systems neg for  any significant sore throat, dysphagia, itching, sneezing, nasal congestion or excess/ purulent secretions, fever, chills, sweats, unintended wt loss, pleuritic or exertional cp, hempoptysis, orthopnea pnd or change in chronic leg swelling. Also denies presyncope, palpitations, heartburn, abdominal pain, nausea, vomiting, diarrhea or change in bowel or urinary habits, dysuria,hematuria, rash, arthralgias, visual complaints, headache, numbness weakness or ataxia.     Objective:   Physical Exam  Gen. Pleasant, well-nourished, in no distress ENT - no lesions, no post nasal drip Neck: No JVD, no thyromegaly, no carotid bruits Lungs: no use of accessory muscles, no dullness to percussion, clear without rales or rhonchi  Cardiovascular: Rhythm regular, heart sounds  normal, no murmurs or gallops, no peripheral edema Musculoskeletal: No deformities, no cyanosis or clubbing         Assessment & Plan:

## 2015-03-07 NOTE — Assessment & Plan Note (Signed)
Proceed with full PFTs to quantitate whether she is a candidate for resection

## 2015-03-07 NOTE — Progress Notes (Signed)
Patient ID: Tiffany Caldwell, female   DOB: 1941-08-22, 74 y.o.   MRN: [id number] Request for biopsy of RUL lung has been reviewed. I would recommend a PET/CT. If it is negative, I would recommend a 3 to 6 month follow up CT. If positive, percutaneous biopsy can be performed but carries elevated risk of prolong pneumothorax, bronchopleural fistula, and death from hemorrhage.

## 2015-03-07 NOTE — Assessment & Plan Note (Addendum)
Intermediate to high risk for malignancy, given non-resolving cavitary infiltrate for 3 months in this ex-smoker We'll review with radiology, if necessary obtain PET scan-but will likely need CT-guided transthoracic biopsy The various options of biopsy including bronchoscopy, CT guided needle aspiration and surgical biopsy were discussed.The risks of each procedure including coughing, bleeding and the  chances of lung puncture requiring chest tube were discussed in great detail. The benefits & alternatives including serial follow up were also discussed.

## 2015-03-08 ENCOUNTER — Other Ambulatory Visit: Payer: Self-pay | Admitting: Pulmonary Disease

## 2015-03-08 DIAGNOSIS — J984 Other disorders of lung: Secondary | ICD-10-CM

## 2015-03-19 ENCOUNTER — Other Ambulatory Visit: Payer: Self-pay | Admitting: Radiology

## 2015-03-19 ENCOUNTER — Encounter (HOSPITAL_COMMUNITY)
Admission: RE | Admit: 2015-03-19 | Discharge: 2015-03-19 | Disposition: A | Discharge status: Home / Self Care | Payer: Medicare Other | Source: Ambulatory Visit | Attending: Pulmonary Disease | Admitting: Pulmonary Disease

## 2015-03-19 DIAGNOSIS — Z853 Personal history of malignant neoplasm of breast: Secondary | ICD-10-CM | POA: Diagnosis not present

## 2015-03-19 DIAGNOSIS — N8189 Other female genital prolapse: Secondary | ICD-10-CM | POA: Insufficient documentation

## 2015-03-19 DIAGNOSIS — J984 Other disorders of lung: Secondary | ICD-10-CM

## 2015-03-19 DIAGNOSIS — R918 Other nonspecific abnormal finding of lung field: Secondary | ICD-10-CM | POA: Diagnosis present

## 2015-03-19 DIAGNOSIS — I517 Cardiomegaly: Secondary | ICD-10-CM | POA: Diagnosis not present

## 2015-03-19 DIAGNOSIS — J432 Centrilobular emphysema: Secondary | ICD-10-CM | POA: Insufficient documentation

## 2015-03-19 DIAGNOSIS — I6523 Occlusion and stenosis of bilateral carotid arteries: Secondary | ICD-10-CM | POA: Diagnosis not present

## 2015-03-19 LAB — GLUCOSE, CAPILLARY: Glucose-Capillary: 89 mg/dL (ref 65–99)

## 2015-03-19 MED ORDER — FLUDEOXYGLUCOSE F - 18 (FDG) INJECTION
6.8800 | Freq: Once | INTRAVENOUS | Status: AC | PRN
  Administered 2015-03-19: 6.88 via INTRAVENOUS

## 2015-03-20 ENCOUNTER — Ambulatory Visit (HOSPITAL_COMMUNITY): Admission: RE | Admit: 2015-03-20 | Payer: Medicare Other | Source: Ambulatory Visit

## 2015-03-20 ENCOUNTER — Ambulatory Visit (HOSPITAL_COMMUNITY)
Admission: RE | Admit: 2015-03-20 | Discharge: 2015-03-20 | Disposition: A | Discharge status: Home / Self Care | Payer: Medicare Other | Source: Ambulatory Visit | Attending: Diagnostic Radiology | Admitting: Diagnostic Radiology

## 2015-03-20 ENCOUNTER — Ambulatory Visit (HOSPITAL_COMMUNITY)
Admission: RE | Admit: 2015-03-20 | Discharge: 2015-03-20 | Disposition: A | Discharge status: Home / Self Care | Payer: Medicare Other | Source: Ambulatory Visit | Attending: Pulmonary Disease | Admitting: Pulmonary Disease

## 2015-03-20 ENCOUNTER — Encounter (HOSPITAL_COMMUNITY): Payer: Self-pay

## 2015-03-20 DIAGNOSIS — Z79899 Other long term (current) drug therapy: Secondary | ICD-10-CM | POA: Insufficient documentation

## 2015-03-20 DIAGNOSIS — J984 Other disorders of lung: Secondary | ICD-10-CM | POA: Insufficient documentation

## 2015-03-20 DIAGNOSIS — I10 Essential (primary) hypertension: Secondary | ICD-10-CM | POA: Diagnosis not present

## 2015-03-20 DIAGNOSIS — Z7982 Long term (current) use of aspirin: Secondary | ICD-10-CM | POA: Insufficient documentation

## 2015-03-20 DIAGNOSIS — Z9889 Other specified postprocedural states: Secondary | ICD-10-CM

## 2015-03-20 DIAGNOSIS — R911 Solitary pulmonary nodule: Secondary | ICD-10-CM | POA: Insufficient documentation

## 2015-03-20 DIAGNOSIS — Z87891 Personal history of nicotine dependence: Secondary | ICD-10-CM | POA: Insufficient documentation

## 2015-03-20 DIAGNOSIS — Z853 Personal history of malignant neoplasm of breast: Secondary | ICD-10-CM | POA: Insufficient documentation

## 2015-03-20 LAB — PROTIME-INR
INR: 1.05 (ref 0.00–1.49)
PROTHROMBIN TIME: 13.9 s (ref 11.6–15.2)

## 2015-03-20 LAB — CBC
HEMATOCRIT: 40.5 % (ref 36.0–46.0)
Hemoglobin: 13.4 g/dL (ref 12.0–15.0)
MCH: 31.8 pg (ref 26.0–34.0)
MCHC: 33.1 g/dL (ref 30.0–36.0)
MCV: 96 fL (ref 78.0–100.0)
PLATELETS: 148 10*3/uL — AB (ref 150–400)
RBC: 4.22 MIL/uL (ref 3.87–5.11)
RDW: 12.4 % (ref 11.5–15.5)
WBC: 3.8 10*3/uL — AB (ref 4.0–10.5)

## 2015-03-20 LAB — APTT: APTT: 33 s (ref 24–37)

## 2015-03-20 MED ORDER — SODIUM CHLORIDE 0.9 % IV SOLN
INTRAVENOUS | Status: DC
  Administered 2015-03-20: 11:00:00 via INTRAVENOUS

## 2015-03-20 MED ORDER — FENTANYL CITRATE (PF) 100 MCG/2ML IJ SOLN
INTRAMUSCULAR | Status: AC
  Filled 2015-03-20: qty 4

## 2015-03-20 MED ORDER — FENTANYL CITRATE (PF) 100 MCG/2ML IJ SOLN
INTRAMUSCULAR | Status: AC | PRN
  Administered 2015-03-20 (×2): 50 ug via INTRAVENOUS

## 2015-03-20 NOTE — Discharge Instructions (Signed)
Needle Biopsy of Lung, Care After °Refer to this sheet in the next few weeks. These instructions provide you with information on caring for yourself after your procedure. Your health care provider may also give you more specific instructions. Your treatment has been planned according to current medical practices, but problems sometimes occur. Call your health care provider if you have any problems or questions after your procedure. °WHAT TO EXPECT AFTER THE PROCEDURE °· A bandage will be applied over the area where the needle was inserted. You may be asked to apply pressure to the bandage for several minutes to ensure there is minimal bleeding. °· In most cases, you can leave when your needle biopsy procedure is completed. Do not drive yourself home. Someone else should take you home. °· If you received an IV sedative or general anesthetic, you will be taken to a comfortable place to relax while the medicine wears off. °· If you have upcoming travel scheduled, talk to your health care provider about when it is safe to travel by air after the procedure. °HOME CARE INSTRUCTIONS °· Expect to take it easy for the rest of the day. °· Protect the area where you received the needle biopsy by keeping the bandage in place for as long as instructed. °· You may feel some mild pain or discomfort in the area, but this should stop in a day or two. °· Take medicines only as directed by your health care provider. °SEEK MEDICAL CARE IF:  °· You have pain at the biopsy site that worsens or is not helped by medicine. °· You have swelling or drainage at the needle biopsy site. °· You have a fever. °SEEK IMMEDIATE MEDICAL CARE IF:  °· You have new or worsening shortness of breath. °· You have chest pain. °· You are coughing up blood. °· You have bleeding that does not stop with pressure or a bandage. °· You develop light-headedness or fainting. °Document Released: 07/20/2007 Document Revised: 02/06/2014 Document Reviewed:  02/14/2013 °Exit[employer]® Patient Information ©2015 [employer]. This information is not intended to replace advice given to you by your health care provider. Make sure you discuss any questions you have with your health care provider. °Lung Biopsy °A lung biopsy is a procedure in which a tissue sample is removed from the lung. The tissue can be examined under a microscope to help diagnose various lung disorders.  °LET YOUR HEALTH CARE PROVIDER KNOW ABOUT: °· Any allergies you have. °· All medicines you are taking, including vitamins, herbs, eye drops, creams, and over-the-counter medicines. °· Previous problems you or members of your family have had with the use of anesthetics. °· Any blood disorders or bleeding problems that you have. °· Previous surgeries you have had. °· Medical conditions you have. °RISKS AND COMPLICATIONS °Generally, a lung biopsy is a safe procedure. However, problems can occur and include: °· Collapse of the lung.   °· Bleeding.   °· Infection.   °BEFORE THE PROCEDURE °· Do not eat or drink anything after midnight on the night before the procedure or as directed by your health care provider. °· Ask your health care provider about changing or stopping your regular medicines. This is especially important if you are taking diabetes medicines or blood thinners. °· Plan to have someone take you home after the procedure. °PROCEDURE °Various methods can be used to perform a lung biopsy:  °· Needle biopsy. A biopsy needle is inserted into the lung. The needle is used to collect the tissue sample. A CT   scanner may be used to guide the needle to the right place in the lung. For this method, a medicine is used to numb the area where the biopsy sample will be taken (local anesthetic). °· Bronchoscopy. A flexible tube (bronchoscope) is inserted into your lungs by going through your mouth or nose. A needle or forceps is passed through the bronchoscope to remove the tissue sample. For this method, medicine  may be used to numb the back of your throat. °· Open biopsy. A cut (incision) is made in your chest. The tissue sample is then removed using surgical tools. The incision is closed with skin glue, skin adhesive strips, or stitches. For this method, you will be given medicine to make you sleep through the procedure (general anesthetic). °AFTER THE PROCEDURE °· Your recovery will be assessed and monitored. °· You might have soreness and tenderness at the site of the biopsy for a few days after the procedure. °· You might have a cough and some soreness in your throat for a few days if a bronchoscope was used. °Document Released: 12/11/2004 Document Revised: 02/06/2014 Document Reviewed: 03/06/2013 °Exit[employer]® Patient Information ©2015 [employer]. This information is not intended to replace advice given to you by your health care provider. Make sure you discuss any questions you have with your health care provider. °Conscious Sedation, Adult, Care After °Refer to this sheet in the next few weeks. These instructions provide you with information on caring for yourself after your procedure. Your health care provider may also give you more specific instructions. Your treatment has been planned according to current medical practices, but problems sometimes occur. Call your health care provider if you have any problems or questions after your procedure. °WHAT TO EXPECT AFTER THE PROCEDURE  °After your procedure: °· You may feel sleepy, clumsy, and have poor balance for several hours. °· Vomiting may occur if you eat too soon after the procedure. °HOME CARE INSTRUCTIONS °· Do not participate in any activities where you could become injured for at least 24 hours. Do not: °¨ Drive. °¨ Swim. °¨ Ride a bicycle. °¨ Operate heavy machinery. °¨ Cook. °¨ Use power tools. °¨ Climb ladders. °¨ Work from a high place. °· Do not make important decisions or sign legal documents until you are improved. °· If you vomit, drink water, juice,  or soup when you can drink without vomiting. Make sure you have little or no nausea before eating solid foods. °· Only take over-the-counter or prescription medicines for pain, discomfort, or fever as directed by your health care provider. °· Make sure you and your family fully understand everything about the medicines given to you, including what side effects may occur. °· You should not drink alcohol, take sleeping pills, or take medicines that cause drowsiness for at least 24 hours. °· If you smoke, do not smoke without supervision. °· If you are feeling better, you may resume normal activities 24 hours after you were sedated. °· Keep all appointments with your health care provider. °SEEK MEDICAL CARE IF: °· Your skin is pale or bluish in color. °· You continue to feel nauseous or vomit. °· Your pain is getting worse and is not helped by medicine. °· You have bleeding or swelling. °· You are still sleepy or feeling clumsy after 24 hours. °SEEK IMMEDIATE MEDICAL CARE IF: °· You develop a rash. °· You have difficulty breathing. °· You develop any type of allergic problem. °· You have a fever. °MAKE SURE YOU: °· Understand these   instructions. °· Will watch your condition. °· Will get help right away if you are not doing well or get worse. °Document Released: 07/13/2013 Document Reviewed: 07/13/2013 °ExitCare® Patient Information ©2015 [employer]. This information is not intended to replace advice given to you by your health care provider. Make sure you discuss any questions you have with your health care provider. ° °

## 2015-03-20 NOTE — H&P (Signed)
Chief Complaint: "I am here for a lung biopsy."  Referring Physician(s): Alva,Rakesh V  History of Present Illness: Tiffany Caldwell is a 74 y.o. female with history of left breast cancer who is a previous tobacco user 30 pack years, quit in 1990. She was treated with pneumonia in December and a RLL lung nodule found on follow up CXR on 10/2014. PET done on 03/19/15 RLL with mild hypermetabolism. Case discussed at tumor board, patient scheduled today for CT guided RLL lung nodule biopsy. She denies any hemoptysis, fever or chills. She denies any chest pain, shortness of breath or palpitations. She denies any active signs of bleeding or excessive bruising. The patient denies any history of sleep apnea or chronic oxygen use. She has previously tolerated sedation without complications.   Past Medical History  Diagnosis Date  . Hypertension   . Irritable bowel syndrome (IBS)   . Arthritis   . Allergy   . Pinched nerve     back and left leg  . GERD (gastroesophageal reflux disease)   . Hyperlipidemia   . Breast cancer     left breast    Past Surgical History  Procedure Laterality Date  . Knee arthroscopy  January 2011    right  . Coronary artery bypass graft  2001  . Breast surgery  02/01/2004    tram/mastectomyfor recurrence  . Breast fibroadenoma surgery  01/13/1980  . Mastectomy partial / lumpectomy w/ axillary lymphadenectomy  04/10/1989    Dr Annamaria Boots / left breast  . Partial hysterectomy      vaginal  . Tonsillectomy and adenoidectomy    . Upper gastrointestinal endoscopy    . Colonoscopy      Allergies: Amoxicillin; Ciprofloxacin; Levofloxacin; Lisinopril; Vioxx; and Azithromycin  Medications: Prior to Admission medications   Medication Sig Start Date End Date Taking? Authorizing Provider  acetaminophen (TYLENOL) 500 MG tablet Take 1,000 mg by mouth 2 (two) times daily.    Yes Historical Provider, MD  aspirin 81 MG tablet Take 81 mg by mouth daily.     Yes Historical  Provider, MD  Calcium Carbonate-Vitamin D (CALCIUM 600 + D PO) Take 600 mg by mouth daily.     Yes Historical Provider, MD  cetirizine (ZYRTEC) 10 MG tablet Take 10 mg by mouth daily.   Yes Historical Provider, MD  fish oil-omega-3 fatty acids 1000 MG capsule Take 2 g by mouth 2 (two) times daily.    Yes Historical Provider, MD  Glucosamine-Chondroit-Vit C-Mn (GLUCOSAMINE CHONDR 1500 COMPLX PO) Take 1 tablet by mouth 2 (two) times daily.   Yes Historical Provider, MD  Multiple Vitamin (MULTIVITAMIN) tablet Take 1 tablet by mouth daily.     Yes Historical Provider, MD  simvastatin (ZOCOR) 20 MG tablet Take 20 mg by mouth at bedtime.     Yes Historical Provider, MD  valsartan-hydrochlorothiazide (DIOVAN-HCT) 80-12.5 MG per tablet Take 1 tablet by mouth daily.     Yes Historical Provider, MD  hyoscyamine (LEVBID) 0.375 MG 12 hr tablet Take 0.375 mg by mouth every 12 (twelve) hours as needed (IBS).     Historical Provider, MD     Family History  Problem Relation Age of Onset  . Emphysema Father     deceased  . Heart disease Mother     deceased  . Colon cancer Paternal Grandmother   . Colon cancer Son   . Colon cancer Paternal Uncle     History   Social History  . Marital Status: Married  Spouse Name: N/A  . Number of Children: N/A  . Years of Education: N/A   Social History Main Topics  . Smoking status: Former Smoker    Types: Cigarettes    Quit date: 07/31/1989  . Smokeless tobacco: Never Used  . Alcohol Use: 0.6 oz/week    1 Glasses of wine per week     Comment: occasional wine  . Drug Use: No  . Sexual Activity: Not on file   Other Topics Concern  . None   Social History Narrative   Review of Systems: A 12 point ROS discussed and pertinent positives are indicated in the HPI above.  All other systems are negative.  Review of Systems  Vital Signs: BP 134/66 mmHg  Pulse 77  Temp(Src) 97.5 F (36.4 C) (Oral)  Resp 18  SpO2 98%  Physical Exam  Constitutional:  She is oriented to person, place, and time. No distress.  HENT:  Head: Normocephalic and atraumatic.  Neck: No tracheal deviation present.  Cardiovascular: Normal rate and regular rhythm.  Exam reveals no gallop and no friction rub.   No murmur heard. Pulmonary/Chest: Effort normal and breath sounds normal. No respiratory distress. She has no wheezes. She has no rales.  Neurological: She is alert and oriented to person, place, and time.  Skin: She is not diaphoretic.  Psychiatric: She has a normal mood and affect. Her behavior is normal. Thought content normal.    Mallampati Score:  MD Evaluation Airway: WNL Heart: WNL Abdomen: WNL Chest/ Lungs: WNL ASA  Classification: 2 Mallampati/Airway Score: Two  Imaging: Ct Chest Wo Contrast  02/27/2015   CLINICAL DATA:  Three months followup of right lower lobe lung nodule  EXAM: CT CHEST WITHOUT CONTRAST  TECHNIQUE: Multidetector CT imaging of the chest was performed following the standard protocol without IV contrast.  COMPARISON:  CT chest scan of 11/27/2014 and CT angio chest of 02/10/2008  FINDINGS: As noted previously severe changes of centrilobular and paraseptal emphysema are noted. Biapical pleural parenchymal scarring appears stable. The cavitary lesion described previously within the superior segment of the right lower lobe has not changed significantly. Measured at the same level the lesion now measures 3.5 x 2.2 cm compared to prior measurements of 3.7 x 2.3 cm. This lesion has a thick wall, and may be inflammatory or infectious, but a cavitary neoplasm cannot be excluded. The pleural-based nodule in the right upper lobe described previously is stable measuring 5 mm. No new pulmonary nodule or infiltrate is seen. No pleural effusion is noted. On bone window images there is a small sclerotic focus in the anterolateral left fourth rib which is seen on the CT from 2009, consistent with a benign bony lesion.  On soft tissue window images, the  thyroid gland is unremarkable. On this unenhanced study, no definite mediastinal or hilar adenopathy is seen. A metallic stent is noted in the LAD distribution. Previously described low-attenuation splenic lesions are stable.  IMPRESSION: 1. Persistent cavitary lesion in the superior segment of the right lower lobe. Possible considerations of that of an infectious, inflammatory, or malignant process. 2. Stable 5 mm pleural-based nodule in the right upper lobe posteriorly. 3. Diffuse changes of severe centrilobular and paraseptal emphysema.   Electronically Signed   By: Ivar Drape M.D.   On: 02/27/2015 14:20   Nm Pet Image Initial (pi) Skull Base To Thigh  03/19/2015   CLINICAL DATA:  Initial treatment strategy for cavitary lung mass in the superior segment right lower lobe. History  of left breast cancer with lumpectomy and axillary node dissection 26 years ago. History of COPD.  EXAM: NUCLEAR MEDICINE PET SKULL BASE TO THIGH  TECHNIQUE: 6.9 mCi F-18 FDG was injected intravenously. Full-ring PET imaging was performed from the skull base to thigh after the radiotracer. CT data was obtained and used for attenuation correction and anatomic localization.  FASTING BLOOD GLUCOSE:  Value: 89 mg/dl  COMPARISON:  Chest CTs of 02/27/2015 and 11/27/2014.  No prior PET.  FINDINGS: NECK  No areas of abnormal hypermetabolism.  CHEST  Superior segment right lower lobe cavitary lesion again identified. 3.4 x 2.2 cm and a S.U.V. max of 2.3 on image 31. Similar in size, at 3.5 x 2.2 cm on the prior exam. The wall thickness may be slightly decreased. For example laterally at 5 mm on image 31 of series 6 versus 6 mm on the prior exam (when remeasured).  Hypermetabolism corresponds to similar right lower lobe ill-defined nodularity which is likely infectious or inflammatory. This measures a S.U.V. max of 2.3.  No thoracic nodal hypermetabolism.  ABDOMEN/PELVIS  No areas of abnormal hypermetabolism.  SKELETON  No abnormal marrow  activity.  CT IMAGES PERFORMED FOR ATTENUATION CORRECTION  No cervical adenopathy.  Bilateral carotid atherosclerosis.  Chest findings deferred to recent diagnostic CT. Prior median sternotomy. Mild cardiomegaly. Left axillary node dissection. Advanced centrilobular/ paraseptal emphysema with pulmonary interstitial thickening and biapical scarring. Left renal cortical thinning. Normal adrenal glands. Colonic stool burden suggests constipation. Probable gallstone. Hysterectomy. Pelvic floor laxity. Colonic stool burden suggests constipation. Hyperattenuation in the right side of the perineum measures 1.4 cm on image 190.  IMPRESSION: 1. Mild hypermetabolism corresponding to a cavitary superior segment right lower lobe lung mass. Considerations include squamous cell carcinoma versus infection. When comparing to the prior diagnostic CT, there may be slight decrease in wall thickness, which would argue for an (at least partially) infectious etiology. If the patient is not a good biopsy or resection candidate, further follow-up with diagnostic chest CT at 6 weeks could be performed. 2. No evidence of hypermetabolic thoracic nodal or extrathoracic metastatic disease. 3. Right lower lobe hypermetabolic infectious/inflammatory clustered nodularity. 4. Advanced centrilobular and paraseptal emphysema with scarring and diffuse interstitial thickening. 5. Pelvic floor laxity. 6. Suspicion of a right-sided Bartholin's gland cyst, complicated by prior hemorrhage or infection.   Electronically Signed   By: Abigail Miyamoto M.D.   On: 03/19/2015 12:05   Labs:  CBC:  Recent Labs  03/20/15 1120  WBC 3.8*  HGB 13.4  HCT 40.5  PLT 148*    COAGS:  Recent Labs  03/20/15 1120  INR 1.05  APTT 33    Assessment and Plan: History of left breast cancer Previous tobacco user 30 pack years, quit 1990  RLL lung nodule found on CXR 10/2014, PET 03/19/15  Discussed at tumor board, scheduled today for CT guided RLL lung nodule  biopsy with sedation The patient has been NPO, no blood thinners taken, labs and vitals have been reviewed. Risks and Benefits discussed with the patient including, but not limited to bleeding, hemoptysis, respiratory failure requiring intubation, infection, pneumothorax requiring chest tube placement, stroke from air embolism or even death. All of the patient's questions were answered, patient is agreeable to proceed. Consent signed and in chart.     Thank you for this interesting consult.  I greatly enjoyed meeting Tiffany Caldwell and look forward to participating in their care.  SignedHedy Jacob 03/20/2015, 11:58 AM   I  spent a total of 30 minutes in face to face in clinical consultation, greater than 50% of which was counseling/coordinating care for RLL lung nodule.

## 2015-03-20 NOTE — Procedures (Signed)
CT guided FNA of cavitary right lung lesion.  Total of 5 FNAs performed.  Specimens collected for culture and cytology. No immediate complication.  Minimal blood loss.

## 2015-03-24 LAB — TISSUE CULTURE
Culture: NO GROWTH
Gram Stain: NONE SEEN

## 2015-04-03 ENCOUNTER — Ambulatory Visit (INDEPENDENT_AMBULATORY_CARE_PROVIDER_SITE_OTHER): Payer: Medicare Other | Admitting: Pulmonary Disease

## 2015-04-03 DIAGNOSIS — R911 Solitary pulmonary nodule: Secondary | ICD-10-CM

## 2015-04-03 LAB — PULMONARY FUNCTION TEST
DL/VA % pred: 68 %
DL/VA: 3.34 ml/min/mmHg/L
DLCO unc % pred: 47 %
DLCO unc: 12.02 ml/min/mmHg
FEF 25-75 Post: 1.07 L/sec
FEF 25-75 Pre: 0.87 L/sec
FEF2575-%Change-Post: 23 %
FEF2575-%Pred-Post: 64 %
FEF2575-%Pred-Pre: 52 %
FEV1-%Change-Post: 5 %
FEV1-%Pred-Post: 71 %
FEV1-%Pred-Pre: 67 %
FEV1-Post: 1.53 L
FEV1-Pre: 1.45 L
FEV1FVC-%Change-Post: 2 %
FEV1FVC-%Pred-Pre: 92 %
FEV6-%Change-Post: 2 %
FEV6-%Pred-Post: 78 %
FEV6-%Pred-Pre: 76 %
FEV6-Post: 2.14 L
FEV6-Pre: 2.09 L
FEV6FVC-%Change-Post: 0 %
FEV6FVC-%Pred-Post: 105 %
FEV6FVC-%Pred-Pre: 104 %
FVC-%Change-Post: 3 %
FVC-%Pred-Post: 75 %
FVC-%Pred-Pre: 72 %
FVC-Post: 2.17 L
FVC-Pre: 2.1 L
Post FEV1/FVC ratio: 71 %
Post FEV6/FVC ratio: 100 %
Pre FEV1/FVC ratio: 69 %
Pre FEV6/FVC Ratio: 99 %
RV % pred: 79 %
RV: 1.86 L
TLC % pred: 76 %
TLC: 3.94 L

## 2015-04-03 NOTE — Progress Notes (Signed)
PFT done today. 

## 2015-04-04 ENCOUNTER — Ambulatory Visit (INDEPENDENT_AMBULATORY_CARE_PROVIDER_SITE_OTHER): Payer: Medicare Other | Admitting: Pulmonary Disease

## 2015-04-04 ENCOUNTER — Telehealth: Payer: Self-pay | Admitting: Pulmonary Disease

## 2015-04-04 ENCOUNTER — Encounter: Payer: Self-pay | Admitting: Pulmonary Disease

## 2015-04-04 VITALS — BP 104/54 | HR 75 | Ht 64.75 in | Wt 141.0 lb

## 2015-04-04 DIAGNOSIS — J432 Centrilobular emphysema: Secondary | ICD-10-CM | POA: Diagnosis not present

## 2015-04-04 DIAGNOSIS — R911 Solitary pulmonary nodule: Secondary | ICD-10-CM

## 2015-04-04 DIAGNOSIS — A310 Pulmonary mycobacterial infection: Secondary | ICD-10-CM

## 2015-04-04 NOTE — Telephone Encounter (Signed)
Please let her know -Tissue culture grew out Mycobacterium avium We got results today. I would like her to get an infectious disease consult-doubt that she needs treatment for this. It is not contagious. Repeat scan in 3-4 months as planned

## 2015-04-04 NOTE — Telephone Encounter (Signed)
Patient would like to know more information about Mycobacterium Avium.  She wants to know what caused this and how it can be treated.   RA - please advise.

## 2015-04-04 NOTE — Assessment & Plan Note (Signed)
Lung capacity is at 70% You will benefit from pulmonary rehab program - OK to start exercise program No need for medications

## 2015-04-04 NOTE — Telephone Encounter (Signed)
lmtcb

## 2015-04-04 NOTE — Progress Notes (Signed)
   Subjective:    Patient ID: Izora Gala, female    DOB: [birth year], 74 y.o.   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 She smoked about 30-pack-years until she quit in 1990. She underwent CABG in 2001 after failed angioplasty. She developed chills and left-sided chest pain in late December 2015, chest x-ray showed left lower lobe infiltrate. She was treated with Levaquin for one week with clinical improvement. Follow-up chest x-ray on 10/24/14 suggested right upper lobe nodule hence CT chest was performed She had breast CA in [place] -underwent lumpectomy  RT, then recurrence in 2005 requiring mastectomy & reconstruction   04/04/2015  Chief Complaint  Patient presents with  . Follow-up    PFT results. PET scan results, CT results.    5 She has recovered fully from the episode of pneumonia in December 2015 She denies dyspnea on exertion or wheezing. She denies frequent chest colds, she does take a long time to get over an episode of bronchitis   Significant tests/ events  CXR 10/03/14 showed LLL pneumonia FU CXR 10/24/14 showed resolution, but picked up 48mm nodule in RUL. CT chest 11/27/14 showed severe emphysema, cavitary lesion within the superior segment of the right lower lobe which was not seen on the prior exam in 2009-3.7 x 2.3 cm. New 5 mm subpleural nodule in the posterior aspect of the right upper lobe. Echo 11/2014-normal LV function, dilated left atrium, RVSP 40  CT chest 02/27/15 >> Persistent cavitary lesion in the superior segment of the right lower lobe, Stable 5 mm pleural-based nodule   PET 03/2015 SUV 2.3  CT guided Bx >> atypical cells ,no malignancy, culture neg  PFTs 6/201 FEV1 67%,no BD response,  DLCO 47%  Review of Systems neg for any significant sore throat, dysphagia, itching, sneezing, nasal congestion or excess/ purulent secretions, fever, chills, sweats, unintended wt  loss, pleuritic or exertional cp, hempoptysis, orthopnea pnd or change in chronic leg swelling. Also denies presyncope, palpitations, heartburn, abdominal pain, nausea, vomiting, diarrhea or change in bowel or urinary habits, dysuria,hematuria, rash, arthralgias, visual complaints, headache, numbness weakness or ataxia.     Objective:   Physical Exam  Gen. Pleasant, well-nourished, in no distress ENT - no lesions, no post nasal drip Neck: No JVD, no thyromegaly, no carotid bruits Lungs: no use of accessory muscles, no dullness to percussion, clear without rales or rhonchi  Cardiovascular: Rhythm regular, heart sounds  normal, no murmurs or gallops, no peripheral edema Musculoskeletal: No deformities, no cyanosis or clubbing        Assessment & Plan:

## 2015-04-04 NOTE — Assessment & Plan Note (Signed)
CT scan in October Neg biopsy reassuring, [facility] pre test prob, neg culture

## 2015-04-04 NOTE — Patient Instructions (Signed)
Lung capacity is at 70% You will benefit from pulmonary rehab program - OK to start exercise program CT scan in October

## 2015-04-05 NOTE — Telephone Encounter (Signed)
Order for referral entered. Nothing further needed.

## 2015-04-05 NOTE — Telephone Encounter (Signed)
Discussed M. Avium with pt She is agreeable to ID consult Does not need Rx in my opinion , interestingly her husband was getting BCG treatment for bladder CA

## 2015-04-29 LAB — AFB CULTURE WITH SMEAR (NOT AT ARMC): Acid Fast Smear: NONE SEEN

## 2015-05-01 ENCOUNTER — Encounter: Payer: Self-pay | Admitting: Internal Medicine

## 2015-05-01 ENCOUNTER — Ambulatory Visit (INDEPENDENT_AMBULATORY_CARE_PROVIDER_SITE_OTHER): Payer: Medicare Other | Admitting: Internal Medicine

## 2015-05-01 VITALS — BP 106/66 | HR 82 | Temp 98.1°F | Ht 66.5 in | Wt 141.2 lb

## 2015-05-01 DIAGNOSIS — I10 Essential (primary) hypertension: Secondary | ICD-10-CM | POA: Insufficient documentation

## 2015-05-01 DIAGNOSIS — I2510 Atherosclerotic heart disease of native coronary artery without angina pectoris: Secondary | ICD-10-CM | POA: Insufficient documentation

## 2015-05-01 DIAGNOSIS — E785 Hyperlipidemia, unspecified: Secondary | ICD-10-CM | POA: Diagnosis not present

## 2015-05-01 DIAGNOSIS — A310 Pulmonary mycobacterial infection: Secondary | ICD-10-CM | POA: Insufficient documentation

## 2015-05-01 NOTE — Progress Notes (Signed)
Patient ID: Tiffany Caldwell, female   DOB: January 25, 1941, 74 y.o.   MRN: [id number]         [facility] for Infectious Disease  Reason for Consult: Mycobacterium avium pulmonary infection Referring Physician: Dr. Kara Mead  Patient Active Problem List   Diagnosis Date Noted  . Pulmonary Mycobacterium avium infection 05/01/2015    Priority: High  . COPD (chronic obstructive pulmonary disease) 12/14/2014    Priority: Medium  . Coronary artery disease 05/01/2015  . Hypertension 05/01/2015  . Dyslipidemia 05/01/2015  . Solitary pulmonary nodule 12/14/2014  . History of breast cancer 07/25/2011  . Gallstones 10/31/2010  . CHRONIC RHINOSINUSITIS 09/24/2010  . EXTERNAL HEMORRHOIDS 09/23/2010  . DIVERTICULOSIS OF COLON 09/23/2010    Patient's Medications  New Prescriptions   No medications on file  Previous Medications   ACETAMINOPHEN (TYLENOL) 500 MG TABLET    Take 1,000 mg by mouth 2 (two) times daily.    ASPIRIN 81 MG TABLET    Take 81 mg by mouth daily.     CALCIUM CARBONATE-VITAMIN D (CALCIUM 600 + D PO)    Take 600 mg by mouth daily.     CETIRIZINE (ZYRTEC) 10 MG TABLET    Take 10 mg by mouth daily.   FISH OIL-OMEGA-3 FATTY ACIDS 1000 MG CAPSULE    Take 2 g by mouth 2 (two) times daily.    GLUCOSAMINE-CHONDROIT-VIT C-MN (GLUCOSAMINE CHONDR 1500 COMPLX PO)    Take 1 tablet by mouth 2 (two) times daily.   HYOSCYAMINE (LEVBID) 0.375 MG 12 HR TABLET    Take 0.375 mg by mouth every 12 (twelve) hours as needed (IBS).    MULTIPLE VITAMIN (MULTIVITAMIN) TABLET    Take 1 tablet by mouth daily.     SIMVASTATIN (ZOCOR) 20 MG TABLET    Take 20 mg by mouth at bedtime.     VALSARTAN-HYDROCHLOROTHIAZIDE (DIOVAN-HCT) 80-12.5 MG PER TABLET    Take 1 tablet by mouth daily.    Modified Medications   No medications on file  Discontinued Medications   No medications on file    Recommendations: 1. Observe off of antibiotics for now 2. Send Mycobacterium avium isolate to [facility] in [place], [place] for susceptibility testing   3. Follow-up in 3 months after chest CT scan  Assessment: Ms. Keel has a single cavitary lesion in the right lower lobe that has grown Mycobacterium avium. The cavity did not appear to increase in size between February and May of that she her and she has no clinical signs or symptoms of active infection at this time. Following current guidelines treatment for cavitary disease due to Mycobacterium avium consists of a macrolide plus ethambutol and rifampin/rifabutin given daily for 12 months. However she is allergic to macrolide antibiotics. I do not think that I can make her feel better with antibiotics therapy and I would probably make her feel worse. I recommend observing off of antibiotics for now pending repeat CT scan in 3 months. She is in agreement with that plan.  HPI: Tiffany Caldwell is a 74 y.o. 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 She has a history of cigarette smoking but quit in 1990. She has COPD. She developed left lower lobe pneumonia last December. She was treated with levofloxacin. She had a great deal of problems with nausea but was able to complete her course of levofloxacin and gradually improved back to her normal baseline within 6  weeks. A chest CT scan was done in February which revealed a 5 mm nodule in the right upper lobe and a cavitary lesion in the right lower lobe. A follow-up scan in late May revealed no change in the nodule or cavitary lesion. A PET scan was obtained and showed mild hypermetabolism in the right lower lobe cavitary lesion. She underwent CT-guided biopsy in June. Cytology revealed a few cells with atypia and abundant necrosis. AFB culture has grown Mycobacterium avium.  Review of Systems: Constitutional: negative for anorexia, chills, fevers, sweats and weight loss Eyes: negative Ears, nose, mouth, throat, and face:  negative Respiratory: negative for chronic bronchitis, cough, dyspnea on exertion, hemoptysis, pleurisy/chest pain and sputum Cardiovascular: negative Gastrointestinal: negative Genitourinary:negative    Past Medical History  Diagnosis Date  . Hypertension   . Irritable bowel syndrome (IBS)   . Arthritis   . Allergy   . Pinched nerve     back and left leg  . GERD (gastroesophageal reflux disease)   . Hyperlipidemia   . Breast cancer     left breast    History  Substance Use Topics  . Smoking status: Former Smoker    Types: Cigarettes    Quit date: 07/31/1989  . Smokeless tobacco: Never Used  . Alcohol Use: 0.6 oz/week    1 Glasses of wine per week     Comment: occasional wine    Family History  Problem Relation Age of Onset  . Emphysema Father     deceased  . Heart disease Mother     deceased  . Colon cancer Paternal Grandmother   . Colon cancer Son   . Colon cancer Paternal Uncle    Allergies  Allergen Reactions  . Amoxicillin   . Ciprofloxacin Nausea Only  . Levofloxacin Nausea Only  . Lisinopril     Fatigue   . Vioxx [Rofecoxib]   . Azithromycin Rash    OBJECTIVE: Blood pressure 106/66, pulse 82, temperature 98.1 F (36.7 C), temperature source Oral, height 5' 6.5" (1.689 m), weight 141 lb 4 oz (64.071 kg).   General: She is smiling and in no distress Skin: No rash Lungs: Clear Cor: Regular S1 and S2 with no murmur  Microbiology: No results found for this or any previous visit (from the past 240 hour(s)).  Michel Bickers, MD [facility] [place] [facility] [phone] pager   [phone] cell 05/01/2015, 10:39 AM

## 2015-07-06 ENCOUNTER — Telehealth: Payer: Self-pay | Admitting: Pulmonary Disease

## 2015-07-06 NOTE — Telephone Encounter (Signed)
Dr Elsworth Soho you have no openings  Can we overbook you or do you want this pt to see TP instead?

## 2015-07-08 NOTE — Telephone Encounter (Signed)
OK for appt with TP Otherwise put her on list & we can get her in

## 2015-07-09 NOTE — Telephone Encounter (Signed)
Spoke with patient, she is having CT done 10/12 and wants to be seen by Dr. Elsworth Soho, not TP, she says that she is very concerns about this CT and wants to discuss the results with her doctor.   She does not want to wait until November.   Dr. Elsworth Soho, please advise where to put her on your schedule.

## 2015-07-11 NOTE — Telephone Encounter (Signed)
Patient notified. Put in reminders to contact patient to schedule OV after 10/12 CT has been done. Nothing further needed.

## 2015-07-11 NOTE — Telephone Encounter (Signed)
Keep her on list & we can call when opening after 10/12

## 2015-07-18 ENCOUNTER — Ambulatory Visit (INDEPENDENT_AMBULATORY_CARE_PROVIDER_SITE_OTHER)
Admission: RE | Admit: 2015-07-18 | Discharge: 2015-07-18 | Disposition: A | Discharge status: Home / Self Care | Payer: Medicare Other | Source: Ambulatory Visit | Attending: Pulmonary Disease | Admitting: Pulmonary Disease

## 2015-07-18 DIAGNOSIS — J432 Centrilobular emphysema: Secondary | ICD-10-CM | POA: Diagnosis not present

## 2015-07-26 ENCOUNTER — Ambulatory Visit (INDEPENDENT_AMBULATORY_CARE_PROVIDER_SITE_OTHER): Payer: Medicare Other | Admitting: Pulmonary Disease

## 2015-07-26 ENCOUNTER — Encounter: Payer: Self-pay | Admitting: Pulmonary Disease

## 2015-07-26 VITALS — BP 102/62 | HR 63 | Ht 66.5 in | Wt 141.6 lb

## 2015-07-26 DIAGNOSIS — Z23 Encounter for immunization: Secondary | ICD-10-CM

## 2015-07-26 DIAGNOSIS — A310 Pulmonary mycobacterial infection: Secondary | ICD-10-CM | POA: Diagnosis not present

## 2015-07-26 DIAGNOSIS — J432 Centrilobular emphysema: Secondary | ICD-10-CM

## 2015-07-26 NOTE — Assessment & Plan Note (Signed)
Emphysema underlying Does not need Rx Flu shot

## 2015-07-26 NOTE — Patient Instructions (Signed)
Schedule Appointment with Dr Cyndia Bent I will discuss in lung cancer conference & get back to you

## 2015-07-26 NOTE — Progress Notes (Signed)
   Subjective:    Patient ID: Tiffany Caldwell, female    DOB: Feb 08, 1941, 74 y.o.   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 She underwent CABG in 2001 after failed angioplasty.  She had breast CA in [place] -underwent lumpectomy  RT, then recurrence in 2005 requiring mastectomy & reconstruction  She developed chills and left-sided chest pain in late December 2015, chest x-ray showed left lower lobe infiltrate. She was treated with Levaquin for one week with clinical improvement. Follow-up chest x-ray on 10/24/14 suggested right upper lobe nodule hence CT chest was performed-showed RLL cavitary nodule    07/26/2015  Chief Complaint  Patient presents with  . Follow-up    CT Review. Pt states that she is doing well.    She has recovered fully from the episode of pneumonia in December 2015. SHe saw Dr Megan Salon - allergic to azithro, hence mAI Rx deferred She denies dyspnea on exertion or wheezing. She denies frequent chest colds, she does take a long time to get over an episode of bronchitis FU CT 07/2015 reviewed - unchanged RLL cavitary nodule  Significant tests/ events  CXR 10/03/14 showed LLL pneumonia FU CXR 10/24/14 showed resolution, but picked up 56mm nodule in RUL. CT chest 11/27/14 showed severe emphysema, cavitary lesion within the superior segment of the right lower lobe which was not seen on the prior exam in 2009-3.7 x 2.3 cm. New 5 mm subpleural nodule in the posterior aspect of the right upper lobe. Echo 11/2014-normal LV function, dilated left atrium, RVSP 40  CT chest 02/27/15 >> Persistent cavitary lesion in the superior segment of the right lower lobe, Stable 5 mm pleural-based nodule   PET 03/2015 SUV 2.3  CT guided Bx >> atypical cells ,no malignancy, culture neg  PFTs 03/2015 FEV1 67%,no BD response,  DLCO  47%   Review of Systems  neg for any significant sore throat, dysphagia, itching, sneezing, nasal congestion or excess/ purulent secretions, fever, chills, sweats, unintended wt loss, pleuritic or exertional cp, hempoptysis, orthopnea pnd or change in chronic leg swelling. Also denies presyncope, palpitations, heartburn, abdominal pain, nausea, vomiting, diarrhea or change in bowel or urinary habits, dysuria,hematuria, rash, arthralgias, visual complaints, headache, numbness weakness or ataxia.     Objective:   Physical Exam  Gen. Pleasant, well-nourished, in no distress ENT - no lesions, no post nasal drip Neck: No JVD, no thyromegaly, no carotid bruits Lungs: no use of accessory muscles, no dullness to percussion, clear without rales or rhonchi  Cardiovascular: Rhythm regular, heart sounds  normal, no murmurs or gallops, no peripheral edema Musculoskeletal: No deformities, no cyanosis or clubbing        Assessment & Plan:

## 2015-07-26 NOTE — Assessment & Plan Note (Signed)
Schedule Appointment with Dr Cyndia Bent - chances of malignancy low but possible I will discuss in lung cancer conference  - will review biopsy etc

## 2015-07-31 ENCOUNTER — Institutional Professional Consult (permissible substitution) (INDEPENDENT_AMBULATORY_CARE_PROVIDER_SITE_OTHER): Payer: Medicare Other | Admitting: Surgery

## 2015-07-31 VITALS — BP 122/70 | HR 79 | Resp 16 | Ht 65.5 in | Wt 141.0 lb

## 2015-07-31 DIAGNOSIS — R911 Solitary pulmonary nodule: Secondary | ICD-10-CM

## 2015-08-01 ENCOUNTER — Ambulatory Visit: Payer: Medicare Other | Admitting: Internal Medicine

## 2015-08-01 ENCOUNTER — Encounter: Payer: Self-pay | Admitting: Surgery

## 2015-08-01 NOTE — Progress Notes (Signed)
Cardiothoracic Surgery Consultation  PCP is Irven Shelling, MD Referring Provider is Rigoberto Noel, MD  Chief Complaint  Patient presents with  . Lung Lesion    RLLobe..CT CHEST 07/18/15.Marland KitchenMarland KitchenPET 03/19/15.Marland KitchenMarland KitchenPREVIOUS CYTOLOGY NEGATIVE 03/20/15.Marland KitchenMarland KitchenPFT 04/03/15    HPI:  The patient is a 74 year old previous heavy smoker until 1990 with a history of hypertension, hyperlipidemia and coronary disease s/p emergent CABG by me in 2001 after a failed angioplasty. She also has a history of breast cancer in 1990 treated with lumpectomy and XRT followed by recurrence in 2005 treated with mastectomy and reconstruction. She developed chills and left sided chest pain in December 2015 and a CXR on 10/04/2015 showed extensive left lower lobe infiltrate. She was treated with Levaquin for a week with clinical improvement and a follow up CXR on 10/24/2014 showed significant improvement in the LLL infiltrate but a new 7-8 mm nodular density in the RUL. A CT scan on 11/27/2014 showed diffuse changes of COPD with apical scarring that were felt to correspond to the nodular density seen on CXR. There was a new cavitary lesion in the RLL that appeared to be a superinfected bleb and a new 5 mm subpleural nodule in the posterior RUL. A follow up CT on 02/27/2015 showed a persistent cavitary lesion in the superior segment of the RLL that was unchanged and a stable 5 mm pleural based nodule in the RUL posteriorly that was unchanged. A PET scan on 03/19/2015 showed mild hypermetabolism in the cavitary lesion with an SUV of 2.3. There was felt to be a slight decrease in wall thickness of the cavity. She subsequently underwent a CT guided biopsy of the RLL lesion on 03/20/2015 which showed no cancer and no acid fast bacilli but the culture grew MAI complex. She says she saw ID and a triple antibiotic regimen was considered  but she is allergic to Macrolide antibiotics and was asymptomatic with no change in the cavitary lesion so Dr.  Megan Salon recommended continued observation.  A follow up CT on 07/18/2015 shows no change in the 3.4 x 2.3 cm cavitary lesion in the superior segment of the RLL. She says she still feels well with no fever, chills, malaise or fatigue, or cough. She is eating well with stable weight.   Past Medical History  Diagnosis Date  . Hypertension   . Irritable bowel syndrome (IBS)   . Arthritis   . Allergy   . Pinched nerve     back and left leg  . GERD (gastroesophageal reflux disease)   . Hyperlipidemia   . Breast cancer [facility])     left breast    Past Surgical History  Procedure Laterality Date  . Knee arthroscopy  January 2011    right  . Coronary artery bypass graft  2001  . Breast surgery  02/01/2004    tram/mastectomyfor recurrence  . Breast fibroadenoma surgery  01/13/1980  . Mastectomy partial / lumpectomy w/ axillary lymphadenectomy  04/10/1989    Dr Annamaria Boots / left breast  . Partial hysterectomy      vaginal  . Tonsillectomy and adenoidectomy    . Upper gastrointestinal endoscopy    . Colonoscopy      Family History  Problem Relation Age of Onset  . Emphysema Father     deceased  . Heart disease Mother     deceased  . Colon cancer Paternal Grandmother   . Colon cancer Son   . Colon cancer Paternal Uncle     Social History  Social History  Substance Use Topics  . Smoking status: Former Smoker    Types: Cigarettes    Quit date: 07/31/1989  . Smokeless tobacco: Never Used  . Alcohol Use: 0.6 oz/week    1 Glasses of wine per week     Comment: occasional wine    Current Outpatient Prescriptions  Medication Sig Dispense Refill  . acetaminophen (TYLENOL) 500 MG tablet Take 1,000 mg by mouth 2 (two) times daily.     Marland Kitchen aspirin 81 MG tablet Take 81 mg by mouth daily.      . Calcium Carbonate-Vitamin D (CALCIUM 600 + D PO) Take 600 mg by mouth daily.      . cetirizine (ZYRTEC) 10 MG tablet Take 10 mg by mouth daily.    Marland Kitchen docusate sodium (COLACE) 250 MG capsule Take 250 mg  by mouth daily.    . fish oil-omega-3 fatty acids 1000 MG capsule Take 2 g by mouth 2 (two) times daily.     . Glucosamine-Chondroit-Vit C-Mn (GLUCOSAMINE CHONDR 1500 COMPLX PO) Take 1 tablet by mouth 2 (two) times daily.    . hyoscyamine (LEVBID) 0.375 MG 12 hr tablet Take 0.375 mg by mouth every 12 (twelve) hours as needed (IBS).     . Multiple Vitamin (MULTIVITAMIN) tablet Take 1 tablet by mouth daily.      . simvastatin (ZOCOR) 20 MG tablet Take 20 mg by mouth at bedtime.      . valsartan-hydrochlorothiazide (DIOVAN-HCT) 80-12.5 MG per tablet Take 1 tablet by mouth daily.       No current facility-administered medications for this visit.    Allergies  Allergen Reactions  . Amoxicillin   . Ciprofloxacin Nausea Only  . Levofloxacin Nausea Only  . Lisinopril     Fatigue   . Vioxx [Rofecoxib]   . Azithromycin Rash    Review of Systems  Constitutional: Negative for fever, chills, diaphoresis, activity change, appetite change, fatigue and unexpected weight change.  Eyes: Negative.   Respiratory: Negative for cough and shortness of breath.   Cardiovascular: Negative for chest pain.  Gastrointestinal: Negative.   Endocrine: Negative.   Genitourinary: Negative.   Musculoskeletal: Negative.   Skin: Negative.   Allergic/Immunologic: Negative.   Neurological: Negative.   Hematological: Negative.   Psychiatric/Behavioral: Negative.     BP 122/70 mmHg  Pulse 79  Resp 16  Ht 5' 5.5" (1.664 m)  Wt 141 lb (63.957 kg)  BMI 23.10 kg/m2  SpO2 97% Physical Exam  Constitutional: She is oriented to person, place, and time. She appears well-developed and well-nourished. No distress.  HENT:  Head: Atraumatic.  Mouth/Throat: Oropharynx is clear and moist.  Eyes: EOM are normal.  Neck: Neck supple. No JVD present. No thyromegaly present.  Cardiovascular: Normal rate, regular rhythm, normal heart sounds and intact distal pulses.   No murmur heard. Pulmonary/Chest: Effort normal and  breath sounds normal. No respiratory distress. She has no wheezes. She has no rales. She exhibits no tenderness.  Abdominal: Soft. Bowel sounds are normal. She exhibits no distension and no mass. There is no tenderness.  Musculoskeletal: She exhibits no edema.  Lymphadenopathy:    She has no cervical adenopathy.  Neurological: She is alert and oriented to person, place, and time. She has normal strength. No cranial nerve deficit or sensory deficit.  Skin: Skin is warm and dry.  Psychiatric: She has a normal mood and affect.     Diagnostic Tests:  CLINICAL DATA: Follow-up right lower lobe cavitary lesion.  EXAM:  CT CHEST WITHOUT CONTRAST  TECHNIQUE: Multidetector CT imaging of the chest was performed following the standard protocol without IV contrast.  COMPARISON: CT scan of Feb 27, 2015.  FINDINGS: No pneumothorax or significant pleural effusion is noted. Stable emphysematous changes noted throughout both lungs, but most prominently in the upper lobes. Stable 4 mm nodule is noted laterally in the left upper lobe. Stable biapical scarring. Stable cavitary lesion is noted in superior segment of right lower lobe that currently measures 3.4 x 2.3 cm. Stable 4 mm nodule is noted posteriorly in the right upper lobe. Status post coronary artery bypass graft. Coronary stent is noted in LAD. Stable sclerotic lesion is noted in lateral portion of left rib. No mediastinal mass or adenopathy is noted. There is no evidence of thoracic aortic aneurysm. Visualized portion of upper abdomen is unremarkable.  IMPRESSION: Stable emphysematous changes are noted in both lungs.  Stable small bilateral pulmonary nodules are noted.  Stable cavitary lesion seen in superior segment of right lower lobe which currently measures 3.4 x 2.3 cm.   Electronically Signed  By: Marijo Conception, M.D.  On: 07/18/2015 12:49   Impression:  She has a stable cavitary lesion in the superior  segment of the right lower lobe. This was found shortly after she presented with an acute respiratory infection with extensive LLL infiltrate that improved with antibiotics. A biopsy showed no cancer but cultures grew MAI complex and most likely this is all related to an acute MAI infection. She is asymptomatic with no signs of active infection and allergic to the most effective antibiotic. I think continued observation is the best option for her. I think a cavitary lung cancer is less likely given the relatively acute presentation of this, negative biopsy, positive MAI culture and stability of the lesion. Surgical resection is reserved for symptomatic patients who don't respond to medical treatment or can't tolerate medical treatment and would require a lobectomy which I don't think she would tolerate very well. Her emphysema is upper lobe predominant and removal of a lower lobe would significantly decrease her lung function in a 74 year old with at least moderate COPD and reduction in diffusion capacity.  Plan:  She will be discussed at [place] and then a plan for follow up will be arranged with Dr. Elsworth Soho.  Gaye Pollack, MD [facility] [phone]

## 2015-08-02 ENCOUNTER — Telehealth: Payer: Self-pay | Admitting: Pulmonary Disease

## 2015-08-02 NOTE — Telephone Encounter (Signed)
Let her know that I discussed with Dr. Cyndia Bent in multidisciplinary group- They all agreed that cavitary lung cancer is less likely and continued observation-repeat CAT scan in 6 months without contrast

## 2015-08-03 NOTE — Telephone Encounter (Signed)
Pt is aware. notihng further needed

## 2015-10-10 ENCOUNTER — Encounter: Payer: Self-pay | Admitting: Internal Medicine

## 2015-10-18 DIAGNOSIS — M47816 Spondylosis without myelopathy or radiculopathy, lumbar region: Secondary | ICD-10-CM | POA: Diagnosis not present

## 2015-10-22 DIAGNOSIS — M542 Cervicalgia: Secondary | ICD-10-CM | POA: Diagnosis not present

## 2015-10-29 DIAGNOSIS — L570 Actinic keratosis: Secondary | ICD-10-CM | POA: Diagnosis not present

## 2015-10-29 DIAGNOSIS — L438 Other lichen planus: Secondary | ICD-10-CM | POA: Diagnosis not present

## 2015-10-29 DIAGNOSIS — L821 Other seborrheic keratosis: Secondary | ICD-10-CM | POA: Diagnosis not present

## 2015-11-06 ENCOUNTER — Ambulatory Visit (INDEPENDENT_AMBULATORY_CARE_PROVIDER_SITE_OTHER): Payer: PPO | Admitting: Pulmonary Disease

## 2015-11-06 ENCOUNTER — Encounter: Payer: Self-pay | Admitting: Pulmonary Disease

## 2015-11-06 VITALS — BP 110/64 | HR 75 | Ht 66.5 in | Wt 143.4 lb

## 2015-11-06 DIAGNOSIS — J449 Chronic obstructive pulmonary disease, unspecified: Secondary | ICD-10-CM | POA: Diagnosis not present

## 2015-11-06 DIAGNOSIS — A310 Pulmonary mycobacterial infection: Secondary | ICD-10-CM

## 2015-11-06 DIAGNOSIS — R911 Solitary pulmonary nodule: Secondary | ICD-10-CM

## 2015-11-06 NOTE — Assessment & Plan Note (Signed)
Schedule CT chest with contrast in 12/2015 No treatment indicated for now Greater than 50% time was spent in counseling and coordination of care with the patient

## 2015-11-06 NOTE — Patient Instructions (Signed)
Schedule CT chest with contrast in 12/2015 Call as needed

## 2015-11-06 NOTE — Progress Notes (Signed)
   Subjective:    Patient ID: Tiffany Caldwell, female    DOB: 23-May-1941, 75 y.o.   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 She underwent CABG in 2001 after failed angioplasty.  She had breast CA in [place] -underwent lumpectomy  RT, then recurrence in 2005 requiring mastectomy & reconstruction   11/06/2015  Chief Complaint  Patient presents with  . Follow-up    patient doing well.  no concerns.    105m FU CT chest 10/2014 after an acute illness first showed RLL cavitary nodule SHe saw Dr Megan Salon 04/3016- allergic to azithro,  Fernande Boyden Rx deferred Saw Bartle - we discussed at [facility]- no surgery  She denies dyspnea on exertion or wheezing. She denies frequent chest colds, she does take a long time to get over an episode of bronchitis  I personally reviewed  imaging studies and compared to old films  Significant tests/ events  CXR 10/03/14 showed LLL pneumonia FU CXR 10/24/14 showed resolution, but picked up 32mm nodule in RUL. CT chest 11/27/14 showed severe emphysema, cavitary lesion within the superior segment of the right lower lobe which was not seen on the prior exam in 2009-3.7 x 2.3 cm. New 5 mm subpleural nodule in the posterior aspect of the right upper lobe. Echo 11/2014-normal LV function, dilated left atrium, RVSP 40  CT chest 02/27/15 >> Persistent cavitary lesion in the superior segment of the right lower lobe, Stable 5 mm pleural-based nodule   PET 03/2015 SUV 2.3  03/2015 CT guided Bx >> atypical cells ,no malignancy, MAC positive  PFTs 03/2015 FEV1 67%,no BD response,  DLCO 47%  CT 07/2015  - unchanged RLL cavitary nodule   Review of Systems neg for any significant sore throat, dysphagia, itching, sneezing, nasal congestion or excess/ purulent secretions, fever, chills, sweats, unintended wt loss, pleuritic or exertional  cp, hempoptysis, orthopnea pnd or change in chronic leg swelling. Also denies presyncope, palpitations, heartburn, abdominal pain, nausea, vomiting, diarrhea or change in bowel or urinary habits, dysuria,hematuria, rash, arthralgias, visual complaints, headache, numbness weakness or ataxia.      Objective:   Physical Exam  Gen. Pleasant, well-nourished, in no distress ENT - no lesions, no post nasal drip Neck: No JVD, no thyromegaly, no carotid bruits Lungs: no use of accessory muscles, no dullness to percussion, clear without rales or rhonchi  Cardiovascular: Rhythm regular, heart sounds  normal, no murmurs or gallops, no peripheral edema Musculoskeletal:  no cyanosis or clubbing        Assessment & Plan:

## 2015-11-06 NOTE — Assessment & Plan Note (Signed)
No meds for now

## 2015-11-12 DIAGNOSIS — M4806 Spinal stenosis, lumbar region: Secondary | ICD-10-CM | POA: Diagnosis not present

## 2015-11-19 DIAGNOSIS — M545 Low back pain: Secondary | ICD-10-CM | POA: Diagnosis not present

## 2015-11-23 DIAGNOSIS — M4806 Spinal stenosis, lumbar region: Secondary | ICD-10-CM | POA: Diagnosis not present

## 2015-11-28 DIAGNOSIS — I10 Essential (primary) hypertension: Secondary | ICD-10-CM | POA: Diagnosis not present

## 2015-11-30 ENCOUNTER — Other Ambulatory Visit: Payer: PPO

## 2015-11-30 DIAGNOSIS — R911 Solitary pulmonary nodule: Secondary | ICD-10-CM

## 2015-12-03 DIAGNOSIS — M25571 Pain in right ankle and joints of right foot: Secondary | ICD-10-CM | POA: Diagnosis not present

## 2015-12-12 ENCOUNTER — Encounter: Payer: Self-pay | Admitting: Pulmonary Disease

## 2015-12-13 DIAGNOSIS — M4806 Spinal stenosis, lumbar region: Secondary | ICD-10-CM | POA: Diagnosis not present

## 2015-12-18 ENCOUNTER — Ambulatory Visit (INDEPENDENT_AMBULATORY_CARE_PROVIDER_SITE_OTHER)
Admission: RE | Admit: 2015-12-18 | Discharge: 2015-12-18 | Disposition: A | Discharge status: Home / Self Care | Payer: PPO | Source: Ambulatory Visit | Attending: Pulmonary Disease | Admitting: Pulmonary Disease

## 2015-12-18 DIAGNOSIS — R911 Solitary pulmonary nodule: Secondary | ICD-10-CM

## 2015-12-18 MED ORDER — IOHEXOL 300 MG/ML  SOLN
80.0000 mL | Freq: Once | INTRAMUSCULAR | Status: AC | PRN
  Administered 2015-12-18: 80 mL via INTRAVENOUS

## 2015-12-26 ENCOUNTER — Encounter: Payer: Self-pay | Admitting: Pulmonary Disease

## 2015-12-26 DIAGNOSIS — H1789 Other corneal scars and opacities: Secondary | ICD-10-CM | POA: Diagnosis not present

## 2015-12-26 DIAGNOSIS — H2511 Age-related nuclear cataract, right eye: Secondary | ICD-10-CM | POA: Diagnosis not present

## 2015-12-26 DIAGNOSIS — H524 Presbyopia: Secondary | ICD-10-CM | POA: Diagnosis not present

## 2016-01-02 DIAGNOSIS — M4806 Spinal stenosis, lumbar region: Secondary | ICD-10-CM | POA: Diagnosis not present

## 2016-01-02 DIAGNOSIS — M7061 Trochanteric bursitis, right hip: Secondary | ICD-10-CM | POA: Diagnosis not present

## 2016-01-03 ENCOUNTER — Telehealth: Payer: Self-pay | Admitting: Pulmonary Disease

## 2016-01-03 NOTE — Telephone Encounter (Signed)
Left message for patient to call back  

## 2016-01-03 NOTE — Telephone Encounter (Signed)
No change in size But appears to have thickened wall We'll discuss with Dr. Cyndia Bent-  Otherwise follow-up CT in 4-6 months

## 2016-01-03 NOTE — Telephone Encounter (Signed)
Called and spoke with pt. Pt states she had a CT done on 12/18/15 and has not received the results and recs from RA. I explained to her that I would send a message RA and would return her call with his recs. She voiced understanding and had no further questions.   RA please advise on CT

## 2016-01-04 NOTE — Telephone Encounter (Signed)
Patient notified of Dr. Bari Mantis recommendations. Will send this to Dr. Elsworth Soho so he can document when he speaks with Dr. Cyndia Bent

## 2016-01-04 NOTE — Telephone Encounter (Signed)
Patient returned call, may be reached at [phone] or [phone]

## 2016-01-04 NOTE — Telephone Encounter (Signed)
[email or web address]  X 2 

## 2016-01-04 NOTE — Telephone Encounter (Signed)
Left message for patient to call back  

## 2016-01-04 NOTE — Telephone Encounter (Signed)
Pt returning call and can be reached @  [phone]  If no answer call cell.Tiffany Caldwell

## 2016-01-08 NOTE — Telephone Encounter (Signed)
Pl make appt with dr Cyndia Bent [facility] to discuss options - I discussed with her

## 2016-01-09 NOTE — Telephone Encounter (Signed)
Called and spoke with Sharee Pimple [facility] to schedule appt  She can not find appt coming up and will need to see when she can be worked in  She will call back

## 2016-01-09 NOTE — Telephone Encounter (Signed)
Sharee Pimple called and states patient's appt has been scheduled for 01/22/16 at 3:30 pm.

## 2016-01-10 DIAGNOSIS — H2511 Age-related nuclear cataract, right eye: Secondary | ICD-10-CM | POA: Diagnosis not present

## 2016-01-10 DIAGNOSIS — H25811 Combined forms of age-related cataract, right eye: Secondary | ICD-10-CM | POA: Diagnosis not present

## 2016-01-22 ENCOUNTER — Ambulatory Visit (INDEPENDENT_AMBULATORY_CARE_PROVIDER_SITE_OTHER): Payer: PPO | Admitting: Surgery

## 2016-01-22 ENCOUNTER — Encounter: Payer: Self-pay | Admitting: Surgery

## 2016-01-22 VITALS — BP 142/70 | HR 78 | Resp 20 | Ht 66.5 in | Wt 140.0 lb

## 2016-01-22 DIAGNOSIS — R918 Other nonspecific abnormal finding of lung field: Secondary | ICD-10-CM

## 2016-01-22 DIAGNOSIS — Z853 Personal history of malignant neoplasm of breast: Secondary | ICD-10-CM

## 2016-01-22 NOTE — Progress Notes (Signed)
HPI:  The patient returns for follow up of a cavitary lesion in the superior segment of the right lower lobe first noted on CT of the chest on 11/27/2014. A follow up CT on 02/27/2015 showed a persistent cavitary lesion in the superior segment of the RLL that was unchanged and a stable 5 mm pleural based nodule in the RUL posteriorly that was unchanged. A PET scan on 03/19/2015 showed mild hypermetabolism in the cavitary lesion with an SUV of 2.3. There was felt to be a slight decrease in wall thickness of the cavity. She subsequently underwent a CT guided biopsy of the RLL lesion on 03/20/2015 which showed no cancer and no acid fast bacilli but the culture grew MAI complex. She says she saw ID and a triple antibiotic regimen was considered but she is allergic to Macrolide antibiotics and was asymptomatic with no change in the cavitary lesion so Dr. Megan Salon recommended continued observation. A follow up CT on 07/18/2015 shows no change in the 3.4 x 2.3 cm cavitary lesion in the superior segment of the RLL. She recently had a follow up CT on 12/18/2015 which showed increased wall thickness throughout the cavitary lung mass with new nodular peribronchovascular interstitial thickening extending from the mass to the right hilum with no change in the overall size of the lesion. She says she still feels well with no fever, chills, malaise or fatigue, or cough. She is eating well with stable weight.    Current Outpatient Prescriptions  Medication Sig Dispense Refill  . acetaminophen (TYLENOL) 500 MG tablet Take 1,000 mg by mouth 2 (two) times daily.     Marland Kitchen aspirin 81 MG tablet Take 81 mg by mouth daily.      . Calcium Carbonate-Vitamin D (CALCIUM 600 + D PO) Take 600 mg by mouth daily.      . cetirizine (ZYRTEC) 10 MG tablet Take 10 mg by mouth daily.    Marland Kitchen docusate sodium (COLACE) 250 MG capsule Take 250 mg by mouth daily.    Marland Kitchen esomeprazole (NEXIUM) 40 MG capsule Take 40 mg by mouth daily at 12 noon.    .  fish oil-omega-3 fatty acids 1000 MG capsule Take 2 g by mouth 2 (two) times daily.     Marland Kitchen gabapentin (NEURONTIN) 300 MG capsule Take 300 mg by mouth 2 (two) times daily.    . Glucosamine-Chondroit-Vit C-Mn (GLUCOSAMINE CHONDR 1500 COMPLX PO) Take 1 tablet by mouth 2 (two) times daily.    . hyoscyamine (LEVBID) 0.375 MG 12 hr tablet Take 0.375 mg by mouth every 12 (twelve) hours as needed (IBS).     . Multiple Vitamin (MULTIVITAMIN) tablet Take 1 tablet by mouth daily.      . simvastatin (ZOCOR) 20 MG tablet Take 20 mg by mouth at bedtime.      . valsartan-hydrochlorothiazide (DIOVAN-HCT) 80-12.5 MG per tablet Take 1 tablet by mouth daily.       No current facility-administered medications for this visit.     Physical Exam: BP 142/70 mmHg  Pulse 78  Resp 20  Ht 5' 6.5" (1.689 m)  Wt 140 lb (63.504 kg)  BMI 22.26 kg/m2  SpO2  She looks well Lungs are clear  Diagnostic Tests:  CLINICAL DATA: 75 year old female with a history of breast cancer  presents for follow-up of a cavitary superior segment right lower  lobe pulmonary nodule. CT-guided biopsy on 03/20/2015 demonstrated  no evidence of malignancy.  EXAM:  CT CHEST WITH CONTRAST  TECHNIQUE:  Multidetector CT imaging of the chest was performed during  intravenous contrast administration.  CONTRAST: 52mL OMNIPAQUE IOHEXOL 300 MG/ML SOLN  COMPARISON: 07/18/2015 chest CT and 03/19/2015 PET-CT.  FINDINGS:  Mediastinum/Nodes: Normal heart size. No pericardial  fluid/thickening. Coronary atherosclerosis status post CABG with  left internal mammary and ascending aortic bypass grafts. Great  vessels are normal in course and caliber. No central pulmonary  emboli. Normal visualized thyroid. Normal esophagus. Left axillary  surgical clips. No axillary adenopathy. No pathologically enlarged  mediastinal or hilar lymph nodes.  Lungs/Pleura: No pneumothorax. No pleural effusion. Moderate  centrilobular emphysema and diffuse bronchial  wall thickening.  Irregular subpleural consolidation, reticulation and distortion in  the anterior lung apices bilaterally is not appreciably changed back  to 02/10/2008, in keeping with benign pleural-parenchymal scarring.  Subpleural 5 mm posterior apical right upper lobe pulmonary nodule  (series 3/ image 9) is not definitely changed since 02/10/2008 and  is benign. Thick walled 3.4 x 2.3 cm cavitary mass in the superior  segment right lower lobe (series 3/image 22) previously measured 3.4  x 2.3 cm on 07/18/2015, overall not appreciably changed in size,  however with interval circumferential increased wall thickness, for  example a 1.0 cm anterior wall thickness, previously 0.5 cm. There  is also new nodular peribronchovascular interstitial thickening  extending from the cavitary mass to the right hilum (best seen on  sagittal series 6/image 48). No acute consolidative airspace disease  or additional significant pulmonary nodules.  Upper abdomen: There are 2 hypodense splenic lesions, largest 1.0  cm, not appreciably changed since 11/27/2014.  Musculoskeletal: No aggressive appearing focal osseous lesions.  Marked degenerative changes in the thoracic spine. Sternotomy wires  appear aligned and intact. Stable appearance of the left ventral  chest wall status post mastectomy.  IMPRESSION:  1. Increased wall thickness throughout the cavitary lung mass in the  superior segment right lower lobe, with new nodular  peribronchovascular interstitial thickening extending from the  cavitary mass to the right hilum. Differential includes indolent  progressive malignancy such as squamous cell carcinoma or indolent  progressive atypical infection such as fungal pneumonia.  2. Chronic findings including coronary atherosclerosis status post  CABG, moderate emphysema and left mastectomy.  Electronically Signed  By: Ilona Sorrel M.D.  On: 12/18/2015 13:26    Impression:  This cavitary mass has  not changed in size but has a significantly thicker wall. This may still be an infectious lesion like MAI, which was cultured on prior biopsy, or fungus. It is possible that this could be a slowly progressive malignant lesion but the biopsy in 03/2015 was negative. I think the options are to continue observation, do another needle biopsy, or resect the lesion. I think the lesion could be resected with a wedge but if it was a cancer it would probably require a lower lobectomy to get a negative margin. She has moderate COPD and is 75 years old with a history of heart disease and would be at higher risk for a lobectomy. I think it is worth doing another biopsy before considering surgical removal and she was thinking along those same lines. If the biopsy comes back negative for cancer or non-diagnostic then we will have to decide about proceeding with surgical removal or continuing to follow it.   Plan:  She will have a CT-guided needle biopsy and will then return to see me.   Gaye Pollack, MD [facility] [phone]

## 2016-02-06 DIAGNOSIS — M25571 Pain in right ankle and joints of right foot: Secondary | ICD-10-CM | POA: Diagnosis not present

## 2016-02-11 ENCOUNTER — Encounter: Payer: Self-pay | Admitting: *Deleted

## 2016-02-11 ENCOUNTER — Other Ambulatory Visit: Payer: Self-pay | Admitting: *Deleted

## 2016-02-11 DIAGNOSIS — J984 Other disorders of lung: Secondary | ICD-10-CM

## 2016-02-15 ENCOUNTER — Other Ambulatory Visit: Payer: Self-pay | Admitting: Radiology

## 2016-02-15 DIAGNOSIS — Z961 Presence of intraocular lens: Secondary | ICD-10-CM | POA: Diagnosis not present

## 2016-02-18 ENCOUNTER — Ambulatory Visit (HOSPITAL_COMMUNITY)
Admission: RE | Admit: 2016-02-18 | Discharge: 2016-02-18 | Disposition: A | Discharge status: Home / Self Care | Payer: PPO | Source: Ambulatory Visit | Attending: Surgery | Admitting: Surgery

## 2016-02-18 ENCOUNTER — Ambulatory Visit (HOSPITAL_COMMUNITY)
Admission: RE | Admit: 2016-02-18 | Discharge: 2016-02-18 | Disposition: A | Discharge status: Home / Self Care | Payer: PPO | Source: Ambulatory Visit | Attending: Interventional Radiology | Admitting: Interventional Radiology

## 2016-02-18 ENCOUNTER — Encounter (HOSPITAL_COMMUNITY): Payer: Self-pay

## 2016-02-18 DIAGNOSIS — R918 Other nonspecific abnormal finding of lung field: Secondary | ICD-10-CM | POA: Diagnosis not present

## 2016-02-18 DIAGNOSIS — Z87891 Personal history of nicotine dependence: Secondary | ICD-10-CM | POA: Insufficient documentation

## 2016-02-18 DIAGNOSIS — Z880 Allergy status to penicillin: Secondary | ICD-10-CM | POA: Insufficient documentation

## 2016-02-18 DIAGNOSIS — Z09 Encounter for follow-up examination after completed treatment for conditions other than malignant neoplasm: Secondary | ICD-10-CM | POA: Diagnosis not present

## 2016-02-18 DIAGNOSIS — E785 Hyperlipidemia, unspecified: Secondary | ICD-10-CM | POA: Insufficient documentation

## 2016-02-18 DIAGNOSIS — Z951 Presence of aortocoronary bypass graft: Secondary | ICD-10-CM | POA: Diagnosis not present

## 2016-02-18 DIAGNOSIS — Z79899 Other long term (current) drug therapy: Secondary | ICD-10-CM | POA: Diagnosis not present

## 2016-02-18 DIAGNOSIS — I10 Essential (primary) hypertension: Secondary | ICD-10-CM | POA: Diagnosis not present

## 2016-02-18 DIAGNOSIS — Z800 Family history of malignant neoplasm of digestive organs: Secondary | ICD-10-CM | POA: Insufficient documentation

## 2016-02-18 DIAGNOSIS — Z8249 Family history of ischemic heart disease and other diseases of the circulatory system: Secondary | ICD-10-CM | POA: Insufficient documentation

## 2016-02-18 DIAGNOSIS — J984 Other disorders of lung: Secondary | ICD-10-CM

## 2016-02-18 DIAGNOSIS — Z825 Family history of asthma and other chronic lower respiratory diseases: Secondary | ICD-10-CM | POA: Insufficient documentation

## 2016-02-18 DIAGNOSIS — K589 Irritable bowel syndrome without diarrhea: Secondary | ICD-10-CM | POA: Insufficient documentation

## 2016-02-18 DIAGNOSIS — Z9889 Other specified postprocedural states: Secondary | ICD-10-CM

## 2016-02-18 DIAGNOSIS — Z7982 Long term (current) use of aspirin: Secondary | ICD-10-CM | POA: Diagnosis not present

## 2016-02-18 DIAGNOSIS — Z853 Personal history of malignant neoplasm of breast: Secondary | ICD-10-CM | POA: Diagnosis not present

## 2016-02-18 DIAGNOSIS — Z888 Allergy status to other drugs, medicaments and biological substances status: Secondary | ICD-10-CM | POA: Diagnosis not present

## 2016-02-18 DIAGNOSIS — Z881 Allergy status to other antibiotic agents status: Secondary | ICD-10-CM | POA: Diagnosis not present

## 2016-02-18 DIAGNOSIS — J850 Gangrene and necrosis of lung: Secondary | ICD-10-CM | POA: Diagnosis not present

## 2016-02-18 LAB — PROTIME-INR
INR: 1.08 (ref 0.00–1.49)
PROTHROMBIN TIME: 14.2 s (ref 11.6–15.2)

## 2016-02-18 LAB — CBC
HCT: 41.2 % (ref 36.0–46.0)
Hemoglobin: 13.5 g/dL (ref 12.0–15.0)
MCH: 31.8 pg (ref 26.0–34.0)
MCHC: 32.8 g/dL (ref 30.0–36.0)
MCV: 96.9 fL (ref 78.0–100.0)
PLATELETS: 151 10*3/uL (ref 150–400)
RBC: 4.25 MIL/uL (ref 3.87–5.11)
RDW: 12.4 % (ref 11.5–15.5)
WBC: 3.8 10*3/uL — AB (ref 4.0–10.5)

## 2016-02-18 LAB — APTT: aPTT: 31 seconds (ref 24–37)

## 2016-02-18 MED ORDER — SODIUM CHLORIDE 0.9 % IV SOLN: Freq: Once | INTRAVENOUS | Status: DC

## 2016-02-18 MED ORDER — FENTANYL CITRATE (PF) 100 MCG/2ML IJ SOLN
INTRAMUSCULAR | Status: AC | PRN
  Administered 2016-02-18: 50 ug via INTRAVENOUS

## 2016-02-18 MED ORDER — LIDOCAINE-EPINEPHRINE 1 %-1:100000 IJ SOLN
INTRAMUSCULAR | Status: AC
  Filled 2016-02-18: qty 1

## 2016-02-18 MED ORDER — FENTANYL CITRATE (PF) 100 MCG/2ML IJ SOLN
INTRAMUSCULAR | Status: AC
  Filled 2016-02-18: qty 2

## 2016-02-18 NOTE — Sedation Documentation (Signed)
Pt tolerated procedure well. No complaints of pain at this time. No SOB.

## 2016-02-18 NOTE — Sedation Documentation (Signed)
Patient is resting comfortably. Vitals stable. 

## 2016-02-18 NOTE — Procedures (Signed)
Interventional Radiology Procedure Note  Procedure:  CT guided core biopsy of Right lung mass.  6 x 18G core.  Biosentry deployed Complications: None Recommendations:  - Ok to shower tomorrow - Do not submerge for 7 days - Routine care - Recovery x 2 hours in supine position - CXR in 1 hour - NPO until CXR  Signed,  Dulcy Fanny. Earleen Newport, DO

## 2016-02-18 NOTE — Progress Notes (Signed)
Radiologist called and stated CXR is clear

## 2016-02-18 NOTE — Discharge Instructions (Signed)

## 2016-02-18 NOTE — Sedation Documentation (Signed)
Vital signs stable. No complaints from pt at this time. Pt has requested only fentanyl during procedure. Pt states she is comfortable at this time

## 2016-02-18 NOTE — H&P (Signed)
Chief Complaint: Patient was seen in consultation today for right lung mass biopsy at the request of Gaye Pollack  Referring Physician(s): Gaye Pollack  Supervising Physician: Corrie Mckusick  Patient Status:  Out-pt  History of Present Illness: Tiffany Caldwell is a 75 y.o. female   Hx breast Ca Right upper lobe mass Previously biopsied 03/2015 Non malignant Mycobacterium avium-intracellulare infection Was followed by ID Recent imaging: 12/18/2015 IMPRESSION: 1. Increased wall thickness throughout the cavitary lung mass in the superior segment right lower lobe, with new nodular peribronchovascular interstitial thickening extending from the cavitary mass to the right hilum. Differential includes indolent progressive malignancy such as squamous cell carcinoma or indolent progressive atypical infection such as fungal pneumonia. 2. Chronic findings including coronary atherosclerosis status post CABG, moderate emphysema and left mastectomy.  Now scheduled for biopsy per Dr Cyndia Bent  Past Medical History  Diagnosis Date  . Hypertension   . Irritable bowel syndrome (IBS)   . Arthritis   . Allergy   . Pinched nerve     back and left leg  . GERD (gastroesophageal reflux disease)   . Hyperlipidemia   . Breast cancer [facility])     left breast    Past Surgical History  Procedure Laterality Date  . Knee arthroscopy  January 2011    right  . Coronary artery bypass graft  2001  . Breast surgery  02/01/2004    tram/mastectomyfor recurrence  . Breast fibroadenoma surgery  01/13/1980  . Mastectomy partial / lumpectomy w/ axillary lymphadenectomy  04/10/1989    Dr Annamaria Boots / left breast  . Partial hysterectomy      vaginal  . Tonsillectomy and adenoidectomy    . Upper gastrointestinal endoscopy    . Colonoscopy      Allergies: Amoxicillin; Ciprofloxacin; Levofloxacin; Lisinopril; Vioxx; and Azithromycin  Medications: Prior to Admission medications   Medication Sig Start  Date End Date Taking? Authorizing Provider  acetaminophen (TYLENOL) 500 MG tablet Take 1,000 mg by mouth 2 (two) times daily.    Yes Historical Provider, MD  aspirin 81 MG tablet Take 81 mg by mouth daily.     Yes Historical Provider, MD  Calcium Carbonate-Vitamin D (CALCIUM 600 + D PO) Take 600 mg by mouth daily.     Yes Historical Provider, MD  cetirizine (ZYRTEC) 10 MG tablet Take 10 mg by mouth daily.   Yes Historical Provider, MD  docusate sodium (COLACE) 250 MG capsule Take 250 mg by mouth daily.   Yes Historical Provider, MD  fish oil-omega-3 fatty acids 1000 MG capsule Take 2 g by mouth 2 (two) times daily.    Yes Historical Provider, MD  gabapentin (NEURONTIN) 300 MG capsule Take 300 mg by mouth 3 (three) times daily.    Yes Historical Provider, MD  Glucosamine-Chondroit-Vit C-Mn (GLUCOSAMINE CHONDR 1500 COMPLX PO) Take 1 tablet by mouth 2 (two) times daily.   Yes Historical Provider, MD  hyoscyamine (LEVBID) 0.375 MG 12 hr tablet Take 0.375 mg by mouth every 12 (twelve) hours as needed (IBS).    Yes Historical Provider, MD  Multiple Vitamin (MULTIVITAMIN) tablet Take 1 tablet by mouth daily.     Yes Historical Provider, MD  simvastatin (ZOCOR) 20 MG tablet Take 20 mg by mouth at bedtime.     Yes Historical Provider, MD  valsartan-hydrochlorothiazide (DIOVAN-HCT) 80-12.5 MG per tablet Take 1 tablet by mouth daily.     Yes Historical Provider, MD     Family History  Problem Relation Age of Onset  .  Emphysema Father     deceased  . Heart disease Mother     deceased  . Colon cancer Paternal Grandmother   . Colon cancer Son   . Colon cancer Paternal Uncle     Social History   Social History  . Marital Status: Married    Spouse Name: N/A  . Number of Children: N/A  . Years of Education: N/A   Social History Main Topics  . Smoking status: Former Smoker    Types: Cigarettes    Quit date: 07/31/1989  . Smokeless tobacco: Never Used  . Alcohol Use: 0.6 oz/week    1 Glasses of  wine per week     Comment: occasional wine  . Drug Use: No  . Sexual Activity: Not Asked   Other Topics Concern  . None   Social History Narrative     Review of Systems: A 12 point ROS discussed and pertinent positives are indicated in the HPI above.  All other systems are negative.  Review of Systems  Constitutional: Negative for fever and activity change.  Respiratory: Negative for cough and shortness of breath.   Cardiovascular: Negative for chest pain.  Gastrointestinal: Negative for abdominal pain.  Neurological: Negative for weakness.  Psychiatric/Behavioral: Negative for behavioral problems and confusion.    Vital Signs: BP 113/73 mmHg  Pulse 78  Temp(Src) 98 F (36.7 C) (Oral)  Resp 16  Ht 5\' 6"  (1.676 m)  Wt 140 lb (63.504 kg)  BMI 22.61 kg/m2  SpO2 96%  Physical Exam  Constitutional: She is oriented to person, place, and time.  Cardiovascular: Normal rate, regular rhythm and normal heart sounds.   Pulmonary/Chest: Effort normal and breath sounds normal. She has no wheezes.  Abdominal: Soft. Bowel sounds are normal. There is no tenderness.  Musculoskeletal: Normal range of motion.  Neurological: She is alert and oriented to person, place, and time.  Skin: Skin is warm and dry.  Psychiatric: She has a normal mood and affect. Her behavior is normal. Judgment and thought content normal.  Nursing note and vitals reviewed.   Mallampati Score:  MD Evaluation Airway: WNL Heart: WNL Abdomen: WNL Chest/ Lungs: WNL ASA  Classification: 3 Mallampati/Airway Score: One  Imaging: No results found.  Labs:  CBC:  Recent Labs  03/20/15 1120 02/18/16 1003  WBC 3.8* 3.8*  HGB 13.4 13.5  HCT 40.5 41.2  PLT 148* 151    COAGS:  Recent Labs  03/20/15 1120  INR 1.05  APTT 33    BMP: No results for input(s): NA, K, CL, CO2, GLUCOSE, BUN, CALCIUM, CREATININE, GFRNONAA, GFRAA in the last 8760 hours.  Invalid input(s): CMP  LIVER FUNCTION TESTS: No  results for input(s): BILITOT, AST, ALT, ALKPHOS, PROT, ALBUMIN in the last 8760 hours.  TUMOR MARKERS: No results for input(s): AFPTM, CEA, CA199, CHROMGRNA in the last 8760 hours.  Assessment and Plan:  Hx Breast Ca RUL mass Previous bx 03/20/15: MAI Follow up imaging reveals new wall thickening Now scheduled for bx per Dr Cyndia Bent Risks and Benefits discussed with the patient including, but not limited to bleeding, hemoptysis, respiratory failure requiring intubation, infection, pneumothorax requiring chest tube placement, stroke from air embolism or even death. All of the patient's questions were answered, patient is agreeable to proceed. Consent signed and in chart.    Thank you for this interesting consult.  I greatly enjoyed meeting SAIDIE LAYER and look forward to participating in their care.  A copy of this report was sent to  the requesting provider on this date.  Electronically Signed: Kennieth Plotts A 02/18/2016, 10:35 AM   I spent a total of  30 Minutes   in face to face in clinical consultation, greater than 50% of which was counseling/coordinating care for R lung mass biopsy

## 2016-02-20 LAB — ACID FAST SMEAR (AFB): ACID FAST SMEAR - AFSCU2: NEGATIVE

## 2016-02-21 ENCOUNTER — Ambulatory Visit (INDEPENDENT_AMBULATORY_CARE_PROVIDER_SITE_OTHER): Payer: PPO | Admitting: Surgery

## 2016-02-21 ENCOUNTER — Encounter: Payer: Self-pay | Admitting: Surgery

## 2016-02-21 VITALS — BP 92/60 | HR 82 | Resp 16 | Ht 66.5 in | Wt 140.0 lb

## 2016-02-21 DIAGNOSIS — R911 Solitary pulmonary nodule: Secondary | ICD-10-CM | POA: Diagnosis not present

## 2016-02-21 DIAGNOSIS — Z853 Personal history of malignant neoplasm of breast: Secondary | ICD-10-CM | POA: Diagnosis not present

## 2016-02-21 NOTE — Progress Notes (Signed)
HPI:  Tiffany Caldwell and her husband return today to discuss the results of her recent CT guided needle biopsy of a cavitary lesion in the RLL of the lung. It was first noted on CT of the chest on 11/27/2014. A follow up CT on 02/27/2015 showed a persistent cavitary lesion in the superior segment of the RLL that was unchanged and a stable 5 mm pleural based nodule in the RUL posteriorly that was unchanged. A PET scan on 03/19/2015 showed mild hypermetabolism in the cavitary lesion with an SUV of 2.3. There was felt to be a slight decrease in wall thickness of the cavity. She subsequently underwent a CT guided biopsy of the RLL lesion on 03/20/2015 which showed no cancer and no acid fast bacilli but the culture grew MAI complex. She says she saw ID and a triple antibiotic regimen was considered but she is allergic to Macrolide antibiotics and was asymptomatic with no change in the cavitary lesion so Dr. Megan Salon recommended continued observation. A follow up CT on 07/18/2015 shows no change in the 3.4 x 2.3 cm cavitary lesion in the superior segment of the RLL. She recently had a follow up CT on 12/18/2015 which showed increased wall thickness throughout the cavitary lung mass with new nodular peribronchovascular interstitial thickening extending from the mass to the right hilum with no change in the overall size of the lesion. She says she still feels well with no fever, chills, malaise or fatigue, or cough. She is eating well with stable weight.   Current Outpatient Prescriptions  Medication Sig Dispense Refill  . acetaminophen (TYLENOL) 500 MG tablet Take 1,000 mg by mouth 2 (two) times daily.     Marland Kitchen aspirin 81 MG tablet Take 81 mg by mouth daily.      . Calcium Carbonate-Vitamin D (CALCIUM 600 + D PO) Take 600 mg by mouth daily.      . cetirizine (ZYRTEC) 10 MG tablet Take 10 mg by mouth daily.    Marland Kitchen docusate sodium (COLACE) 250 MG capsule Take 250 mg by mouth daily.    . fish oil-omega-3 fatty acids  1000 MG capsule Take 2 g by mouth 2 (two) times daily.     Marland Kitchen gabapentin (NEURONTIN) 300 MG capsule Take 300 mg by mouth 3 (three) times daily.     . Glucosamine-Chondroit-Vit C-Mn (GLUCOSAMINE CHONDR 1500 COMPLX PO) Take 1 tablet by mouth 2 (two) times daily.    . hyoscyamine (LEVBID) 0.375 MG 12 hr tablet Take 0.375 mg by mouth every 12 (twelve) hours as needed (IBS).     . Multiple Vitamin (MULTIVITAMIN) tablet Take 1 tablet by mouth daily.      . simvastatin (ZOCOR) 20 MG tablet Take 20 mg by mouth at bedtime.      . valsartan-hydrochlorothiazide (DIOVAN-HCT) 80-12.5 MG per tablet Take 1 tablet by mouth daily.       No current facility-administered medications for this visit.     Physical Exam: BP 92/60 mmHg  Pulse 82  Resp 16  Ht 5' 6.5" (1.689 m)  Wt 140 lb (63.504 kg)  BMI 22.26 kg/m2  SpO2 96% She looks well Lungs are clear  Diagnostic Tests:  Diagnosis Lung, needle/core biopsy(ies), Right lower lobe - EXTENSIVE NECROSIS WITH FOCAL FOREIGN BODY GIANT CELLS, SEE COMMENT. - NO MALIGNANCY IDENTIFIED. Microscopic Comment The majority of the cores are necrotic. There is focal chronic inflammation with histiocytes and a few foreign body giant cells at the edge of two of  the fragments. AFB is negative for acid fast organisms. GMS and PAS are negative for fungal organisms. There is no evidence of malignancy in the viable areas. Vicente Males MD Pathologist, Electronic Signature (Case signed 02/20/2016) Specimen Gross  Impression:  The biopsy of the RLL cavitary lung mass shows no malignancy as before. It has not changed in size since it was first diagnosed in 11/2014 but the wall is thicker. It did grow MAI from the prior biopsy so I think this is more likely to be an infectious lesion. I think the best option for her is to continue to follow this closely. Surgical resection would probably require lobectomy and she is 75 years old with moderate COPD by PFT's and a history of  coronary disease. I reviewed the CT scans again with her and her husband and answered their questions. She would like to avoid surgery unless we can show that this is a lung cancer, which I doubt.  Plan:  I will see her back in 4 months for a repeat chest CT.   Gaye Pollack, MD [facility] [phone]

## 2016-02-22 DIAGNOSIS — M25512 Pain in left shoulder: Secondary | ICD-10-CM | POA: Diagnosis not present

## 2016-03-12 DIAGNOSIS — M25512 Pain in left shoulder: Secondary | ICD-10-CM | POA: Diagnosis not present

## 2016-03-12 DIAGNOSIS — M542 Cervicalgia: Secondary | ICD-10-CM | POA: Diagnosis not present

## 2016-03-12 DIAGNOSIS — M546 Pain in thoracic spine: Secondary | ICD-10-CM | POA: Diagnosis not present

## 2016-03-18 DIAGNOSIS — M25512 Pain in left shoulder: Secondary | ICD-10-CM | POA: Diagnosis not present

## 2016-03-18 DIAGNOSIS — M542 Cervicalgia: Secondary | ICD-10-CM | POA: Diagnosis not present

## 2016-03-18 DIAGNOSIS — M546 Pain in thoracic spine: Secondary | ICD-10-CM | POA: Diagnosis not present

## 2016-03-20 DIAGNOSIS — M546 Pain in thoracic spine: Secondary | ICD-10-CM | POA: Diagnosis not present

## 2016-03-20 DIAGNOSIS — M542 Cervicalgia: Secondary | ICD-10-CM | POA: Diagnosis not present

## 2016-03-20 DIAGNOSIS — M25512 Pain in left shoulder: Secondary | ICD-10-CM | POA: Diagnosis not present

## 2016-03-27 DIAGNOSIS — M542 Cervicalgia: Secondary | ICD-10-CM | POA: Diagnosis not present

## 2016-03-27 DIAGNOSIS — M25512 Pain in left shoulder: Secondary | ICD-10-CM | POA: Diagnosis not present

## 2016-03-27 DIAGNOSIS — M546 Pain in thoracic spine: Secondary | ICD-10-CM | POA: Diagnosis not present

## 2016-04-01 DIAGNOSIS — M542 Cervicalgia: Secondary | ICD-10-CM | POA: Diagnosis not present

## 2016-04-01 DIAGNOSIS — M25512 Pain in left shoulder: Secondary | ICD-10-CM | POA: Diagnosis not present

## 2016-04-01 DIAGNOSIS — M546 Pain in thoracic spine: Secondary | ICD-10-CM | POA: Diagnosis not present

## 2016-04-02 LAB — ACID FAST CULTURE WITH REFLEXED SENSITIVITIES

## 2016-04-02 LAB — ACID FAST CULTURE WITH REFLEXED SENSITIVITIES (MYCOBACTERIA): Acid Fast Culture: NEGATIVE

## 2016-04-03 DIAGNOSIS — M25512 Pain in left shoulder: Secondary | ICD-10-CM | POA: Diagnosis not present

## 2016-04-03 DIAGNOSIS — M542 Cervicalgia: Secondary | ICD-10-CM | POA: Diagnosis not present

## 2016-04-03 DIAGNOSIS — M546 Pain in thoracic spine: Secondary | ICD-10-CM | POA: Diagnosis not present

## 2016-04-10 DIAGNOSIS — M546 Pain in thoracic spine: Secondary | ICD-10-CM | POA: Diagnosis not present

## 2016-04-10 DIAGNOSIS — M542 Cervicalgia: Secondary | ICD-10-CM | POA: Diagnosis not present

## 2016-04-10 DIAGNOSIS — M25512 Pain in left shoulder: Secondary | ICD-10-CM | POA: Diagnosis not present

## 2016-04-14 DIAGNOSIS — M546 Pain in thoracic spine: Secondary | ICD-10-CM | POA: Diagnosis not present

## 2016-04-14 DIAGNOSIS — M25512 Pain in left shoulder: Secondary | ICD-10-CM | POA: Diagnosis not present

## 2016-04-14 DIAGNOSIS — M542 Cervicalgia: Secondary | ICD-10-CM | POA: Diagnosis not present

## 2016-04-17 DIAGNOSIS — M546 Pain in thoracic spine: Secondary | ICD-10-CM | POA: Diagnosis not present

## 2016-04-17 DIAGNOSIS — M25512 Pain in left shoulder: Secondary | ICD-10-CM | POA: Diagnosis not present

## 2016-04-17 DIAGNOSIS — M542 Cervicalgia: Secondary | ICD-10-CM | POA: Diagnosis not present

## 2016-04-22 DIAGNOSIS — M542 Cervicalgia: Secondary | ICD-10-CM | POA: Diagnosis not present

## 2016-04-22 DIAGNOSIS — M25512 Pain in left shoulder: Secondary | ICD-10-CM | POA: Diagnosis not present

## 2016-04-22 DIAGNOSIS — M546 Pain in thoracic spine: Secondary | ICD-10-CM | POA: Diagnosis not present

## 2016-04-24 DIAGNOSIS — M25512 Pain in left shoulder: Secondary | ICD-10-CM | POA: Diagnosis not present

## 2016-04-24 DIAGNOSIS — M542 Cervicalgia: Secondary | ICD-10-CM | POA: Diagnosis not present

## 2016-04-24 DIAGNOSIS — M546 Pain in thoracic spine: Secondary | ICD-10-CM | POA: Diagnosis not present

## 2016-05-05 DIAGNOSIS — M7061 Trochanteric bursitis, right hip: Secondary | ICD-10-CM | POA: Diagnosis not present

## 2016-05-06 DIAGNOSIS — M25512 Pain in left shoulder: Secondary | ICD-10-CM | POA: Diagnosis not present

## 2016-05-06 DIAGNOSIS — M542 Cervicalgia: Secondary | ICD-10-CM | POA: Diagnosis not present

## 2016-05-06 DIAGNOSIS — M546 Pain in thoracic spine: Secondary | ICD-10-CM | POA: Diagnosis not present

## 2016-05-08 DIAGNOSIS — M546 Pain in thoracic spine: Secondary | ICD-10-CM | POA: Diagnosis not present

## 2016-05-08 DIAGNOSIS — M25512 Pain in left shoulder: Secondary | ICD-10-CM | POA: Diagnosis not present

## 2016-05-08 DIAGNOSIS — M542 Cervicalgia: Secondary | ICD-10-CM | POA: Diagnosis not present

## 2016-05-12 DIAGNOSIS — Z1231 Encounter for screening mammogram for malignant neoplasm of breast: Secondary | ICD-10-CM | POA: Diagnosis not present

## 2016-05-12 DIAGNOSIS — Z853 Personal history of malignant neoplasm of breast: Secondary | ICD-10-CM | POA: Diagnosis not present

## 2016-05-13 DIAGNOSIS — M542 Cervicalgia: Secondary | ICD-10-CM | POA: Diagnosis not present

## 2016-05-13 DIAGNOSIS — M546 Pain in thoracic spine: Secondary | ICD-10-CM | POA: Diagnosis not present

## 2016-05-13 DIAGNOSIS — M25512 Pain in left shoulder: Secondary | ICD-10-CM | POA: Diagnosis not present

## 2016-05-20 DIAGNOSIS — M542 Cervicalgia: Secondary | ICD-10-CM | POA: Diagnosis not present

## 2016-05-20 DIAGNOSIS — M546 Pain in thoracic spine: Secondary | ICD-10-CM | POA: Diagnosis not present

## 2016-05-20 DIAGNOSIS — M25512 Pain in left shoulder: Secondary | ICD-10-CM | POA: Diagnosis not present

## 2016-05-21 DIAGNOSIS — Z853 Personal history of malignant neoplasm of breast: Secondary | ICD-10-CM | POA: Diagnosis not present

## 2016-05-27 DIAGNOSIS — M1611 Unilateral primary osteoarthritis, right hip: Secondary | ICD-10-CM | POA: Diagnosis not present

## 2016-05-28 DIAGNOSIS — J984 Other disorders of lung: Secondary | ICD-10-CM | POA: Diagnosis not present

## 2016-05-28 DIAGNOSIS — M5136 Other intervertebral disc degeneration, lumbar region: Secondary | ICD-10-CM | POA: Diagnosis not present

## 2016-05-28 DIAGNOSIS — Z0000 Encounter for general adult medical examination without abnormal findings: Secondary | ICD-10-CM | POA: Diagnosis not present

## 2016-05-28 DIAGNOSIS — E210 Primary hyperparathyroidism: Secondary | ICD-10-CM | POA: Diagnosis not present

## 2016-05-28 DIAGNOSIS — I10 Essential (primary) hypertension: Secondary | ICD-10-CM | POA: Diagnosis not present

## 2016-05-28 DIAGNOSIS — Z1389 Encounter for screening for other disorder: Secondary | ICD-10-CM | POA: Diagnosis not present

## 2016-05-28 DIAGNOSIS — E7800 Pure hypercholesterolemia, unspecified: Secondary | ICD-10-CM | POA: Diagnosis not present

## 2016-06-05 ENCOUNTER — Other Ambulatory Visit: Payer: Self-pay | Admitting: Surgery

## 2016-06-05 DIAGNOSIS — R911 Solitary pulmonary nodule: Secondary | ICD-10-CM

## 2016-06-24 ENCOUNTER — Other Ambulatory Visit: Payer: Self-pay | Admitting: Surgery

## 2016-06-25 ENCOUNTER — Encounter: Payer: Self-pay | Admitting: Surgery

## 2016-06-25 ENCOUNTER — Ambulatory Visit (INDEPENDENT_AMBULATORY_CARE_PROVIDER_SITE_OTHER): Payer: PPO | Admitting: Surgery

## 2016-06-25 ENCOUNTER — Ambulatory Visit
Admission: RE | Admit: 2016-06-25 | Discharge: 2016-06-25 | Disposition: A | Discharge status: Home / Self Care | Payer: PPO | Source: Ambulatory Visit | Attending: Surgery | Admitting: Surgery

## 2016-06-25 VITALS — BP 105/66 | HR 72 | Resp 16 | Wt 146.0 lb

## 2016-06-25 DIAGNOSIS — Z853 Personal history of malignant neoplasm of breast: Secondary | ICD-10-CM

## 2016-06-25 DIAGNOSIS — R911 Solitary pulmonary nodule: Secondary | ICD-10-CM

## 2016-06-25 DIAGNOSIS — R918 Other nonspecific abnormal finding of lung field: Secondary | ICD-10-CM

## 2016-07-01 ENCOUNTER — Encounter: Payer: Self-pay | Admitting: Surgery

## 2016-07-01 DIAGNOSIS — M8588 Other specified disorders of bone density and structure, other site: Secondary | ICD-10-CM | POA: Diagnosis not present

## 2016-07-01 DIAGNOSIS — E210 Primary hyperparathyroidism: Secondary | ICD-10-CM | POA: Diagnosis not present

## 2016-07-01 NOTE — Progress Notes (Signed)
HPI:  The patient returns today for follow up of a RLL cavitary lung mass which has been biopsied twice and showed no malignancy. The cultures from the initial biopsy did grow MAI but ID decided not to treat her since she was asymptomatic and was allergic to Macrolide antibiotics. Her most recent CT on 12/18/2015  showed increased wall thickness throughout the cavitary lung mass with new nodular peribronchovascular interstitial thickening extending from the mass to the right hilum with no change in the overall size of the lesion. She has continued to feel well with no cough, fever or chills. She has normal appetite and has no weight loss.  Current Outpatient Prescriptions  Medication Sig Dispense Refill  . acetaminophen (TYLENOL) 500 MG tablet Take 1,000 mg by mouth 2 (two) times daily.     Marland Kitchen aspirin 81 MG tablet Take 81 mg by mouth daily.      . Calcium Carbonate-Vitamin D (CALCIUM 600 + D PO) Take 600 mg by mouth daily.      . cetirizine (ZYRTEC) 10 MG tablet Take 10 mg by mouth daily.    Marland Kitchen docusate sodium (COLACE) 250 MG capsule Take 250 mg by mouth daily.    . fish oil-omega-3 fatty acids 1000 MG capsule Take 2 g by mouth 2 (two) times daily.     Marland Kitchen gabapentin (NEURONTIN) 300 MG capsule Take 300 mg by mouth 3 (three) times daily. And takes 2 capsules HS    . Glucosamine-Chondroit-Vit C-Mn (GLUCOSAMINE CHONDR 1500 COMPLX PO) Take 1 tablet by mouth 2 (two) times daily.    . hyoscyamine (LEVBID) 0.375 MG 12 hr tablet Take 0.375 mg by mouth every 12 (twelve) hours as needed (IBS).     . Multiple Vitamin (MULTIVITAMIN) tablet Take 1 tablet by mouth daily.      . simvastatin (ZOCOR) 20 MG tablet Take 20 mg by mouth at bedtime.      . valsartan-hydrochlorothiazide (DIOVAN-HCT) 80-12.5 MG per tablet Take 1 tablet by mouth daily.       No current facility-administered medications for this visit.     Physical Exam: BP 105/66   Pulse 72   Resp 16   Ht (P) 5' 6.5" (1.689 m)   Wt 146 lb (66.2  kg)   SpO2 98% Comment: ON RA  BMI (P) 23.21 kg/m  She looks well Lungs are clear  Diagnostic Tests:  CLINICAL DATA:  75 year old female with history of pulmonary nodule. Followup study.  EXAM: CT CHEST WITHOUT CONTRAST  TECHNIQUE: Multidetector CT imaging of the chest was performed following the standard protocol without IV contrast.  COMPARISON:  Multiple priors, most recently chest CT 12/18/2015.  FINDINGS: Cardiovascular: Heart size is borderline enlarged. There is no significant pericardial fluid, thickening or pericardial calcification. There is aortic atherosclerosis, as well as atherosclerosis of the great vessels of the mediastinum and the coronary arteries, including calcified atherosclerotic plaque in the left anterior descending and right coronary arteries. Coronary artery stent in the proximal left anterior descending coronary artery. Status post median sternotomy for CABG cough and a including LIMA to the LAD.  Mediastinum/Nodes: No pathologically enlarged mediastinal or hilar lymph nodes. Please note that accurate exclusion of hilar adenopathy is limited on noncontrast CT scans. Esophagus is unremarkable in appearance. No axillary lymphadenopathy. Surgical clips in the left axillary region where there is some mild focal architectural distortion, presumably scarring from prior left-sided axillary lymph node dissection.  Lungs/Pleura: Previously noted cavitary nodule in the superior segment of  the right lower lobe is now an enlarging 3.6 x 4.4 x 3.0 cm macrolobulated mass with some spiculated margins. This has some internal foci of higher attenuation (potential calcification). This now appears more solid than prior studies (although internal attenuation values range from fluid to soft tissue (9-43 HU), without definite internal areas of cavitation. There are some adjacent postobstructive changes and surrounding micro and macronodules, the largest of  which is in the superior segment of the right lower lobe measuring 1.0 x 0.6 cm (image 56 of series 4) which is new compared to the prior study. Extensive bilateral apical (right greater than left) pleural parenchymal thickening and architectural distortion, most compatible with chronic post infectious/inflammatory scarring. Small amount of subpleural reticulation in the anterior aspect of the left upper lobe deep to the left breast, presumably chronic postradiation changes. Scattered areas of very mild cylindrical bronchiectasis with peribronchovascular micro and macronodularity, most evident in the right middle lobe, similar to prior studies, most compatible with areas of chronic mucoid impaction within terminal bronchioles. Diffuse bronchial wall thickening with moderate centrilobular and paraseptal emphysema.  Upper Abdomen: Aortic atherosclerosis.  Musculoskeletal: Status post left modified radical mastectomy with what appears to be a TRAM flap reconstruction of the left breast. Median sternotomy wires. Well-defined sclerotic lesion with narrow zone of transition in the lateral aspect of the left fourth rib, unchanged compared to prior examinations, presumably a bone island. There are no aggressive appearing lytic or blastic lesions noted in the visualized portions of the skeleton.  IMPRESSION: 1. The previously noted cavitary nodule in the superior segment of the right lower lobe appears larger and more solid than prior examinations. This may simply reflect interval accumulation of fluid/debris within the pre-existing cavity, related to underlying chronic infection. However, given the interval growth of the lesion, underlying neoplasm is of concern. 2. Mild diffuse bronchial wall thickening with moderate centrilobular and paraseptal emphysema; imaging findings suggestive of underlying COPD. 3. Multiple areas of mild cylindrical bronchiectasis and mucoid impaction within  terminal bronchioles, with slight increase in these findings compared to prior examinations, suggesting underlying chronic indolent atypical infectious process such is MAI (mycobacterium avium intracellulare). 4. Aortic atherosclerosis, in addition to at least 2 vessel coronary artery disease. Status post median sternotomy for CABG, including LIMA to the LAD, as well as LAD stent.   Electronically Signed   By: Vinnie Langton M.D.   On: 06/25/2016 11:37   Impression:  The cavitary lesion in the right lower lobe is slightly larger and more solid than on previous scans. This could still be an infectious process but I think it is worthwhile doing another biopsy to try to rule out cancer. I would not consider surgical resection without a diagnosis of cancer since she is 75 years old with moderate COPD by PFT's and a history of coronary disease. I think ENB would be a reasonable option for obtaining a biopsy. I reviewed the CT scans again with her and her husband, the options of continue observation or biopsy and answered their questions. She would like to proceed with a biopsy.  Plan:  I will have her CT formatted for [facility] and will have her return to see Dr. Roxan Hockey to consider ENB.   Gaye Pollack, MD [facility] [phone]

## 2016-07-08 ENCOUNTER — Encounter: Payer: Self-pay | Admitting: Thoracic Surgery (Cardiothoracic Vascular Surgery)

## 2016-07-08 ENCOUNTER — Institutional Professional Consult (permissible substitution) (INDEPENDENT_AMBULATORY_CARE_PROVIDER_SITE_OTHER): Payer: PPO | Admitting: Thoracic Surgery (Cardiothoracic Vascular Surgery)

## 2016-07-08 VITALS — BP 105/64 | HR 76 | Resp 16 | Ht 66.0 in | Wt 145.0 lb

## 2016-07-08 DIAGNOSIS — Z853 Personal history of malignant neoplasm of breast: Secondary | ICD-10-CM | POA: Diagnosis not present

## 2016-07-08 DIAGNOSIS — R911 Solitary pulmonary nodule: Secondary | ICD-10-CM

## 2016-07-08 NOTE — Progress Notes (Signed)
[address]       [name] [address]             [phone]       HPI: Tiffany Caldwell is sent for consideration for navigational bronchoscopy for right lower lobe mass.  Tiffany Caldwell is a 75 year old woman with a past medical history significant for coronary artery disease with emergent coronary bypass grafting due to bleeding with stent placement, remote tobacco abuse, COPD, hypertension, dyslipidemia, breast cancer in 1990, and diverticulosis. She was first found to have a lung nodule back in late 2015. She had a left lower lobe pneumonia at that time. Chest x-ray showed a questionable right upper lobe nodule. She had a CT in early 2016 which showed a mass in the superior segment of the right lower lobe. A follow-up CT that mass persisted. A PET CT showed it had an SUV of 2.3. She has CT-guided biopsy in June 2016. There was no cancer, but cultures did grow MAI. She saw Dr. Megan Salon, but cannot tolerate azithromycin, therefore she was not given antibiotics. By March 2017 the mass was the same size but the wall was significantly thicker. She had a repeat CT-guided biopsy done in May. That was nondiagnostic. Cultures were negative.  She recently saw Dr. Cyndia Bent in follow-up. A new CT showed the cavity was filled in and the mass might be slightly larger in size.  She feels that overall. She gets short of breath with heavy exertion, but does not have any issues with light exercises. She has an occasional dry cough. She denies productive cough or hemoptysis. She denies fevers, chills, and night sweats. She has not had any chest pain, pressure, or tightness.  Patient Active Problem List   Diagnosis Date Noted  . Pulmonary Mycobacterium avium infection ([place]) 05/01/2015  . Coronary artery disease 05/01/2015  . Hypertension 05/01/2015  . Dyslipidemia 05/01/2015  . Solitary pulmonary nodule 12/14/2014  . COPD (chronic obstructive pulmonary disease) ([place]) 12/14/2014  . History of  breast cancer 07/25/2011  . Gallstones 10/31/2010  . CHRONIC RHINOSINUSITIS 09/24/2010  . EXTERNAL HEMORRHOIDS 09/23/2010  . DIVERTICULOSIS OF COLON 09/23/2010   Past Surgical History:  Procedure Laterality Date  . BREAST FIBROADENOMA SURGERY  01/13/1980  . BREAST SURGERY  02/01/2004   tram/mastectomyfor recurrence  . COLONOSCOPY    . CORONARY ARTERY BYPASS GRAFT  2001  . KNEE ARTHROSCOPY  January 2011   right  . MASTECTOMY PARTIAL / LUMPECTOMY W/ AXILLARY LYMPHADENECTOMY  04/10/1989   Dr Annamaria Boots / left breast  . PARTIAL HYSTERECTOMY     vaginal  . TONSILLECTOMY AND ADENOIDECTOMY    . UPPER GASTROINTESTINAL ENDOSCOPY        Current Outpatient Prescriptions  Medication Sig Dispense Refill  . acetaminophen (TYLENOL) 500 MG tablet Take 1,000 mg by mouth 2 (two) times daily.     Marland Kitchen aspirin 81 MG tablet Take 81 mg by mouth daily.      . Calcium Carbonate-Vitamin D (CALCIUM 600 + D PO) Take 600 mg by mouth daily.      . cetirizine (ZYRTEC) 10 MG tablet Take 10 mg by mouth daily.    Marland Kitchen docusate sodium (COLACE) 250 MG capsule Take 250 mg by mouth daily.    . fish oil-omega-3 fatty acids 1000 MG capsule Take 2 g by mouth 2 (two) times daily.     Marland Kitchen gabapentin (NEURONTIN) 300 MG capsule Take 300 mg by mouth 3 (three) times daily. And takes 2  capsules HS    . Glucosamine-Chondroit-Vit C-Mn (GLUCOSAMINE CHONDR 1500 COMPLX PO) Take 1 tablet by mouth 2 (two) times daily.    . hyoscyamine (LEVBID) 0.375 MG 12 hr tablet Take 0.375 mg by mouth every 12 (twelve) hours as needed (IBS).     . Multiple Vitamin (MULTIVITAMIN) tablet Take 1 tablet by mouth daily.      . simvastatin (ZOCOR) 20 MG tablet Take 20 mg by mouth at bedtime.      . valsartan-hydrochlorothiazide (DIOVAN-HCT) 80-12.5 MG per tablet Take 1 tablet by mouth daily.       No current facility-administered medications for this visit.     Physical Exam BP 105/64   Pulse 76   Resp 16   Ht 5\' 6"  (1.676 m)   Wt 145 lb (65.8 kg)   SpO2  96% Comment: ON RA  BMI 23.61 kg/m  75 year old woman in no acute distress Alert and oriented 3 with no focal neurologic deficits Neck supple without thyromegaly or adenopathy Cardiac regular rate and rhythm normal S1 and S2 Lungs diminished but otherwise clear bilaterally Abdomen soft nontender Extremities are without clubbing cyanosis or edema  Diagnostic Tests: CT CHEST WITHOUT CONTRAST  TECHNIQUE: Multidetector CT imaging of the chest was performed following the standard protocol without IV contrast.  COMPARISON:  Multiple priors, most recently chest CT 12/18/2015.  FINDINGS: Cardiovascular: Heart size is borderline enlarged. There is no significant pericardial fluid, thickening or pericardial calcification. There is aortic atherosclerosis, as well as atherosclerosis of the great vessels of the mediastinum and the coronary arteries, including calcified atherosclerotic plaque in the left anterior descending and right coronary arteries. Coronary artery stent in the proximal left anterior descending coronary artery. Status post median sternotomy for CABG cough and a including LIMA to the LAD.  Mediastinum/Nodes: No pathologically enlarged mediastinal or hilar lymph nodes. Please note that accurate exclusion of hilar adenopathy is limited on noncontrast CT scans. Esophagus is unremarkable in appearance. No axillary lymphadenopathy. Surgical clips in the left axillary region where there is some mild focal architectural distortion, presumably scarring from prior left-sided axillary lymph node dissection.  Lungs/Pleura: Previously noted cavitary nodule in the superior segment of the right lower lobe is now an enlarging 3.6 x 4.4 x 3.0 cm macrolobulated mass with some spiculated margins. This has some internal foci of higher attenuation (potential calcification). This now appears more solid than prior studies (although internal attenuation values range from fluid to soft  tissue (9-43 HU), without definite internal areas of cavitation. There are some adjacent postobstructive changes and surrounding micro and macronodules, the largest of which is in the superior segment of the right lower lobe measuring 1.0 x 0.6 cm (image 56 of series 4) which is new compared to the prior study. Extensive bilateral apical (right greater than left) pleural parenchymal thickening and architectural distortion, most compatible with chronic post infectious/inflammatory scarring. Small amount of subpleural reticulation in the anterior aspect of the left upper lobe deep to the left breast, presumably chronic postradiation changes. Scattered areas of very mild cylindrical bronchiectasis with peribronchovascular micro and macronodularity, most evident in the right middle lobe, similar to prior studies, most compatible with areas of chronic mucoid impaction within terminal bronchioles. Diffuse bronchial wall thickening with moderate centrilobular and paraseptal emphysema.  Upper Abdomen: Aortic atherosclerosis.  Musculoskeletal: Status post left modified radical mastectomy with what appears to be a TRAM flap reconstruction of the left breast. Median sternotomy wires. Well-defined sclerotic lesion with narrow zone of transition in the  lateral aspect of the left fourth rib, unchanged compared to prior examinations, presumably a bone island. There are no aggressive appearing lytic or blastic lesions noted in the visualized portions of the skeleton.  IMPRESSION: 1. The previously noted cavitary nodule in the superior segment of the right lower lobe appears larger and more solid than prior examinations. This may simply reflect interval accumulation of fluid/debris within the pre-existing cavity, related to underlying chronic infection. However, given the interval growth of the lesion, underlying neoplasm is of concern. 2. Mild diffuse bronchial wall thickening with  moderate centrilobular and paraseptal emphysema; imaging findings suggestive of underlying COPD. 3. Multiple areas of mild cylindrical bronchiectasis and mucoid impaction within terminal bronchioles, with slight increase in these findings compared to prior examinations, suggesting underlying chronic indolent atypical infectious process such is MAI (mycobacterium avium intracellulare). 4. Aortic atherosclerosis, in addition to at least 2 vessel coronary artery disease. Status post median sternotomy for CABG, including LIMA to the LAD, as well as LAD stent.   Electronically Signed   By: Vinnie Langton M.D.   On: 06/25/2016 11:37 I personally reviewed the CT chest and compared it to her films from March 2017. I concur with findings as noted above  Impression: 75 year old woman with a long-standing mass in the superior segment of the right lower lobe. This is been biopsied on 2 occasions. Both times CT-guided biopsies were done. Needle biopsy showed evidence of cancer. One of 2 biopsies grew MAI on culture. She was asymptomatic and therefore was not treated for MAI.  She now has some changes in the mass with the mass filling in and possibly being slightly larger.  I strongly suspect this is just MAI infection and the cavity is just filled in with debris, but obviously cannot rule out cancer based on scans alone. We had a long discussion regarding the options of continued radiographic observation versus CT-guided biopsy versus navigational bronchoscopy for biopsy. We talked about the relative advantages and disadvantages of CT-guided biopsies versus bronchoscopy. The primary advantage of bronchoscopies will be able to sample from multiple sites in the lesion probably get as much tissue as they did CT-guided biopsy.  We will plan to send specimens for AFB and fungal cultures in addition to pathology.  I have discussed the general nature of the procedure and the need need for general  anesthesia with the patient. She understands this is strictly diagnostic and not therapeutic in any way. We will plan to do it on an outpatient basis. I reviewed the indications, risks, benefits, and alternatives. She understands the risk include those associated with general anesthesia. The primary risk associated with the bronchoscopic biopsy is failure to make a diagnosis. I think we have about a 75% chance of making a definitive diagnosis with the procedure. I informed her of the procedure specific risks such as failure to make a diagnosis, bleeding, and pneumothorax. She also is aware of general risks such as MI, DVT, PE, stroke, even up to the possibility of death.  She accepts the risks and wishes to proceed.  She is to have a hip replacement by Dr. Latanya Maudlin in the near future. She wants to try to do those procedures simultaneously. I do not think that is advisable. She then asked if she could have the procedure done while she is hospitalized after the hip replacement. Again I don't think is particular advisable but we can see if that will potentially workout on the schedule. All in all I think we should do  the bronchoscopic biopsy as a separate procedure.    [name], MD [facility] [phone]

## 2016-07-09 DIAGNOSIS — M1611 Unilateral primary osteoarthritis, right hip: Secondary | ICD-10-CM | POA: Diagnosis not present

## 2016-07-10 ENCOUNTER — Other Ambulatory Visit: Payer: Self-pay | Admitting: Orthopaedic Surgery

## 2016-07-28 ENCOUNTER — Encounter (HOSPITAL_COMMUNITY): Payer: Self-pay

## 2016-07-28 ENCOUNTER — Ambulatory Visit (HOSPITAL_COMMUNITY)
Admission: RE | Admit: 2016-07-28 | Discharge: 2016-07-28 | Disposition: A | Discharge status: Home / Self Care | Payer: PPO | Source: Ambulatory Visit | Attending: Orthopaedic Surgery | Admitting: Orthopaedic Surgery

## 2016-07-28 ENCOUNTER — Encounter (HOSPITAL_COMMUNITY)
Admission: RE | Admit: 2016-07-28 | Discharge: 2016-07-28 | Disposition: A | Discharge status: Home / Self Care | Payer: PPO | Source: Ambulatory Visit | Attending: Orthopaedic Surgery | Admitting: Orthopaedic Surgery

## 2016-07-28 DIAGNOSIS — J449 Chronic obstructive pulmonary disease, unspecified: Secondary | ICD-10-CM | POA: Diagnosis not present

## 2016-07-28 DIAGNOSIS — E785 Hyperlipidemia, unspecified: Secondary | ICD-10-CM | POA: Diagnosis not present

## 2016-07-28 DIAGNOSIS — Z951 Presence of aortocoronary bypass graft: Secondary | ICD-10-CM | POA: Diagnosis not present

## 2016-07-28 DIAGNOSIS — Z01818 Encounter for other preprocedural examination: Secondary | ICD-10-CM | POA: Diagnosis not present

## 2016-07-28 DIAGNOSIS — R9431 Abnormal electrocardiogram [ECG] [EKG]: Secondary | ICD-10-CM | POA: Diagnosis not present

## 2016-07-28 DIAGNOSIS — I10 Essential (primary) hypertension: Secondary | ICD-10-CM | POA: Diagnosis not present

## 2016-07-28 DIAGNOSIS — Z01812 Encounter for preprocedural laboratory examination: Secondary | ICD-10-CM | POA: Diagnosis not present

## 2016-07-28 DIAGNOSIS — I2510 Atherosclerotic heart disease of native coronary artery without angina pectoris: Secondary | ICD-10-CM | POA: Diagnosis not present

## 2016-07-28 DIAGNOSIS — J328 Other chronic sinusitis: Secondary | ICD-10-CM | POA: Insufficient documentation

## 2016-07-28 DIAGNOSIS — Z853 Personal history of malignant neoplasm of breast: Secondary | ICD-10-CM | POA: Diagnosis not present

## 2016-07-28 DIAGNOSIS — I517 Cardiomegaly: Secondary | ICD-10-CM | POA: Insufficient documentation

## 2016-07-28 DIAGNOSIS — Z01810 Encounter for preprocedural cardiovascular examination: Secondary | ICD-10-CM | POA: Insufficient documentation

## 2016-07-28 HISTORY — DX: Other disorders of lung: J98.4

## 2016-07-28 HISTORY — DX: Dyspnea, unspecified: R06.00

## 2016-07-28 LAB — URINALYSIS, ROUTINE W REFLEX MICROSCOPIC
Bilirubin Urine: NEGATIVE
GLUCOSE, UA: NEGATIVE mg/dL
HGB URINE DIPSTICK: NEGATIVE
KETONES UR: NEGATIVE mg/dL
LEUKOCYTES UA: NEGATIVE
Nitrite: NEGATIVE
PH: 5.5 (ref 5.0–8.0)
Protein, ur: NEGATIVE mg/dL
Specific Gravity, Urine: 1.028 (ref 1.005–1.030)

## 2016-07-28 LAB — CBC WITH DIFFERENTIAL/PLATELET
BASOS ABS: 0 10*3/uL (ref 0.0–0.1)
BASOS PCT: 1 %
EOS ABS: 0.1 10*3/uL (ref 0.0–0.7)
Eosinophils Relative: 1 %
HCT: 40.2 % (ref 36.0–46.0)
HEMOGLOBIN: 13.2 g/dL (ref 12.0–15.0)
Lymphocytes Relative: 32 %
Lymphs Abs: 1.2 10*3/uL (ref 0.7–4.0)
MCH: 32.1 pg (ref 26.0–34.0)
MCHC: 32.8 g/dL (ref 30.0–36.0)
MCV: 97.8 fL (ref 78.0–100.0)
MONOS PCT: 8 %
Monocytes Absolute: 0.3 10*3/uL (ref 0.1–1.0)
NEUTROS ABS: 2 10*3/uL (ref 1.7–7.7)
NEUTROS PCT: 58 %
Platelets: 163 10*3/uL (ref 150–400)
RBC: 4.11 MIL/uL (ref 3.87–5.11)
RDW: 12.5 % (ref 11.5–15.5)
WBC: 3.6 10*3/uL — AB (ref 4.0–10.5)

## 2016-07-28 LAB — ABO/RH: ABO/RH(D): O NEG

## 2016-07-28 LAB — BASIC METABOLIC PANEL
ANION GAP: 7 (ref 5–15)
BUN: 23 mg/dL — ABNORMAL HIGH (ref 6–20)
CALCIUM: 11 mg/dL — AB (ref 8.9–10.3)
CO2: 29 mmol/L (ref 22–32)
CREATININE: 0.92 mg/dL (ref 0.44–1.00)
Chloride: 104 mmol/L (ref 101–111)
GFR, EST NON AFRICAN AMERICAN: 59 mL/min — AB (ref >60)
Glucose, Bld: 93 mg/dL (ref 65–99)
Potassium: 4.2 mmol/L (ref 3.5–5.1)
SODIUM: 140 mmol/L (ref 135–145)

## 2016-07-28 LAB — PROTIME-INR
INR: 1.04
PROTHROMBIN TIME: 13.6 s (ref 11.4–15.2)

## 2016-07-28 LAB — TYPE AND SCREEN
ABO/RH(D): O NEG
Antibody Screen: NEGATIVE

## 2016-07-28 LAB — SURGICAL PCR SCREEN
MRSA, PCR: NEGATIVE
Staphylococcus aureus: NEGATIVE

## 2016-07-28 LAB — APTT: APTT: 32 s (ref 24–36)

## 2016-07-28 MED ORDER — LACTATED RINGERS IV SOLN: INTRAVENOUS | Status: DC

## 2016-07-28 NOTE — Pre-Procedure Instructions (Signed)
    Tiffany Caldwell  07/28/2016      [facility] - [place], [place] - [id number] [place] [address] Phone: [phone] Fax: [phone]    Your procedure is scheduled on 08/05/16.  Report to [facility] Admitting at 530 A.M.  Call this number if you have problems the morning of surgery:  [phone]   Remember:  Do not eat food or drink liquids after midnight.  Take these medicines the morning of surgery with A SIP OF WATER --tylenol,neurontin   Do not wear jewelry, make-up or nail polish.  Do not wear lotions, powders, or perfumes, or deoderant.  Do not shave 48 hours prior to surgery.  Men may shave face and neck.  Do not bring valuables to the hospital.  [facility] is not responsible for any belongings or valuables.  Contacts, dentures or bridgework may not be worn into surgery.  Leave your suitcase in the car.  After surgery it may be brought to your room.  For patients admitted to the hospital, discharge time will be determined by your treatment team.  Patients discharged the day of surgery will not be allowed to drive home.   Name and phone number of your driver:  Special instructions:  Do not take any aspirin,anti-inflammatories,vitamins,or herbal supplements 5-7 days prior to surgery.  Please read over the following fact sheets that you were given. MRSA Information

## 2016-07-29 NOTE — Progress Notes (Addendum)
Anesthesia Chart Review:  Pt is a 75 year old female scheduled for R total hip arthroplasty anterior approach on 08/05/2016 with [name], MD.   - Pulmonologist is Kara Mead, MD - PCP is Lavone Orn, MD  PMH includes:  CAD (stent to LAD 12/1999; in-stent restenosis 05/2000 -> LAD back into LM dissection during angioplasty ->s/p 3v CABG 05/2000), HTN, hyperlipidemia, breast cancer, lung mass (MAI vs cancer, see note by Modesto Charon, MD with CT surgery 07/08/16), GERD.  Former smoker. BMI 24.5  Medications include ASA 81 mg, Zocor, valsartan-hctz.   Preoperative labs reviewed.    CXR 07/28/16:  1. Stable right suprahilar density. Stable biapical pleural parenchymal thickening. Reference is made to prior CT report 06/25/2016 . No acute cardiopulmonary disease. 2. Prior CABG.  Heart size stable.  EKG 07/28/16: NSR with sinus arrhythmia. LVH.Cannot rule out Septal infarct, age undetermined. T wave abnormality, consider lateral ischemia. T wave changes are new since 10/26/09 EKG   Echo 11/08/14:  - Left ventricle: The cavity size was normal. There was mild concentric hypertrophy. Systolic function was normal. Theestimated ejection fraction was in the range of 55% to 60%. Wall motion was normal; there were no regional wall motion abnormalities. Doppler parameters are consistent with abnormal left ventricular relaxation (grade 1 diastolic dysfunction). The E/e&' ratio is between 8-15, suggesting indeterminate LV filling pressure. - Aortic valve: Sclerosis without stenosis. Regurgitation pressure half-time: 574 ms. - Mitral valve: Mildly thickened leaflets . There was trivial regurgitation. - Left atrium: Massively dilated at 133 ml/m2. - Atrial septum: A septal defect cannot be excluded. - Tricuspid valve: There was mild regurgitation. - Pulmonary arteries: PA peak pressure: 40 mm Hg (S). - Inferior vena cava: The vessel was normal in size. The respirophasic diameter changes were in the  normal range (>= 50%), consistent with normal central venous pressure. - Impressions: LVEF 55-60%, mild LVH, diastolic dysfunction, indeterminate LV filling pressure, aortic sclerosis with trivial AI, mild TR, RVSP40 mmHg, massive left atrial enlargment.  Holter monitor 11/09/14: NSR, PACs, PVCs. Short run of atrial tachycardia. No sustained arrhythmia.   Reviewed case with Dr. Jenita Seashore.  Pt will need cardiac clearance prior to surgery for EKG changes.  I notified Juliann Pulse in Dr. Jerald Kief office.   Willeen Cass, FNP-BC [facility]/Anesthesiology Phone: [phone] 07/29/2016 3:40 PM  Addendum:   Pt saw [name], MD with cardiology 08/04/16 (notes in [place]) who cleared pt for surgery.    If no changes, I anticipate pt can proceed with surgery as scheduled.   Willeen Cass, FNP-BC [facility] Short Stay [facility]/Anesthesiology Phone: [phone] 08/04/2016 1:50 PM

## 2016-07-31 ENCOUNTER — Encounter: Payer: Self-pay | Admitting: Internal Medicine

## 2016-08-01 ENCOUNTER — Encounter: Payer: Self-pay | Admitting: Internal Medicine

## 2016-08-01 ENCOUNTER — Ambulatory Visit (INDEPENDENT_AMBULATORY_CARE_PROVIDER_SITE_OTHER): Payer: PPO | Admitting: Internal Medicine

## 2016-08-01 VITALS — BP 138/88 | HR 73 | Ht 66.0 in | Wt 147.8 lb

## 2016-08-01 DIAGNOSIS — I2510 Atherosclerotic heart disease of native coronary artery without angina pectoris: Secondary | ICD-10-CM | POA: Diagnosis not present

## 2016-08-01 DIAGNOSIS — I10 Essential (primary) hypertension: Secondary | ICD-10-CM | POA: Diagnosis not present

## 2016-08-01 DIAGNOSIS — Z01810 Encounter for preprocedural cardiovascular examination: Secondary | ICD-10-CM | POA: Diagnosis not present

## 2016-08-01 NOTE — Patient Instructions (Signed)
Medication Instructions:  Your physician recommends that you continue on your current medications as directed. Please refer to the Current Medication list given to you today.   Labwork: None ordered  Testing/Procedures: None ordered  Follow-Up: Your physician wants you to follow-up in: 1 year with Dr.End You will receive a reminder letter in the mail two months in advance. If you don't receive a letter, please call our office to schedule the follow-up appointment.   Any Other Special Instructions Will Be Listed Below (If Applicable).     If you need a refill on your cardiac medications before your next appointment, please call your pharmacy.

## 2016-08-01 NOTE — Progress Notes (Signed)
New Outpatient Visit Date: 08/01/2016  Referring Provider: [name], MD   Chief Complaint: Preoperative cardiovascular evaluation  HPI:  Tiffany Caldwell is a 75 y.o. year-old female with history of coronary artery disease status post remote PCI and subsequent emergent CABG for iatrogenic LMCA/LAD dissection in 2001, hypertension, hyperlipidemia, right lower lobe cavitary lung mass, who has been referred by Dr. Rhona Raider for preoperative cardiovascular evaluation prior to undergoing right total hip arthroplasty. The patient has been trying to remain active despite her right hip pain, though her activity is somewhat limited by hip and back pain. She does not exercise regularly but is able to climb 26 steps at home without chest pain and only mild shortness of breath that has been chronic for several years. She notes that prior to her stent implantation and subsequent CABG in 2001, she had generalized fatigue and presyncope. She has not had any similar symptoms since that time. She denies chest pain, shortness of breath (unless performing strenuous activities), lightheadedness, orthopnea, PND, and leg edema. She had one episode of palpitations 2-3 years ago without accompanying symptoms. She has undergone multiple surgeries in the past, most recently right knee arthroscopically, without anesthesia complications. She monitors her blood pressure at home and notes that it is typically less than 120/70. She is compliant with her medications, including low-dose aspirin, simvastatin, and valsartan-HCTZ.  --------------------------------------------------------------------------------------------------  Cardiovascular History & Procedures: Cardiovascular Problems:  Coronary artery disease status post PCI and subsequent CABG (iatrogenic LMCA dissection)  Risk Factors:  Known coronary artery disease, hypertension, hyperlipidemia, and age  Cath/PCI:  LHC/PCI (12/16/99): LMCA normal. LAD with 80 and  90% mid vessel stenosis. LCx without significant disease. RCA with 30% proximal stenosis. Successful PCI to mid LAD with a NIR [place] 3.0 x 15 mm bare-metal stent.  LHC/PCI (05/28/00): LMCA normal. LAD with 80-90% mid vessel in-stent restenosis. LCx normal. RCA with 30-40% proximal as well as ostial spasm. Plan angioplasty of previous stent complicated by proximal LAD dissection with placement of NIR Elite 3.5 x 12 mm bare-metal stent. Further propagation of the dissection to the LMCA noted prompting referral for urgent CABG.  CV Surgery:  Urgent CABG (05/28/00): LIMA to LAD, SVG to diagonal, SVG to OM.  EP Procedures and Devices:  None  Non-Invasive Evaluation(s):  TTE (11/08/14): Normal LV size with mild concentric LVH. LVEF 55-60% without regional wall motion abnormalities. Grade 1 diastolic dysfunction. Left atrium is severely dilated. RV size is normal with normal contraction. Mild TR. Otherwise no significant valvular abnormalities.  Recent CV Pertinent Labs: Lab Results  Component Value Date   INR 1.04 07/28/2016   K 4.2 07/28/2016   BUN 23 (H) 07/28/2016   CREATININE 0.92 07/28/2016    --------------------------------------------------------------------------------------------------  Past Medical History:  Diagnosis Date  . Allergy   . Arthritis   . Breast cancer ([place])    left breast  . Dyspnea   . GERD (gastroesophageal reflux disease)   . Hyperlipidemia   . Hypertension   . Irritable bowel syndrome (IBS)   . Lung disorder    leison  . Pinched nerve    back and left leg    Past Surgical History:  Procedure Laterality Date  . ABDOMINAL HYSTERECTOMY    . BREAST FIBROADENOMA SURGERY  01/13/1980  . BREAST SURGERY  02/01/2004   tram/mastectomyfor recurrence  . COLONOSCOPY    . CORONARY ARTERY BYPASS GRAFT  2001  . KNEE ARTHROSCOPY  January 2011   right  . MASTECTOMY PARTIAL / LUMPECTOMY W/ AXILLARY  LYMPHADENECTOMY  04/10/1989   Dr Annamaria Boots / left breast  . PARTIAL  HYSTERECTOMY     vaginal  . TONSILLECTOMY AND ADENOIDECTOMY    . UPPER GASTROINTESTINAL ENDOSCOPY      Outpatient Encounter Prescriptions as of 08/01/2016  Medication Sig  . acetaminophen (TYLENOL) 500 MG tablet Take 1,000 mg by mouth 2 (two) times daily.   Marland Kitchen aspirin 81 MG tablet Take 81 mg by mouth daily.    . Calcium Carbonate-Vitamin D (CALCIUM 600 + D PO) Take 600 mg by mouth daily.    . cetirizine (ZYRTEC) 10 MG tablet Take 10 mg by mouth daily.  Marland Kitchen docusate sodium (COLACE) 250 MG capsule Take 500 mg by mouth at bedtime.   . fish oil-omega-3 fatty acids 1000 MG capsule Take 1 g by mouth 2 (two) times daily.   Marland Kitchen gabapentin (NEURONTIN) 300 MG capsule Take 300 mg by mouth 3 (three) times daily. And takes 2 capsules HS  . Glucosamine-Chondroit-Vit C-Mn (GLUCOSAMINE CHONDR 1500 COMPLX PO) Take 1 tablet by mouth 2 (two) times daily.  . hyoscyamine (LEVBID) 0.375 MG 12 hr tablet Take 0.375 mg by mouth every 12 (twelve) hours as needed (IBS).   . Multiple Vitamin (MULTIVITAMIN) tablet Take 1 tablet by mouth daily.    . simvastatin (ZOCOR) 20 MG tablet Take 20 mg by mouth at bedtime.    . valsartan-hydrochlorothiazide (DIOVAN-HCT) 80-12.5 MG per tablet Take 1 tablet by mouth daily.     No facility-administered encounter medications on file as of 08/01/2016.     Allergies: Amoxicillin; Ciprofloxacin; Levofloxacin; Lisinopril; Vioxx [rofecoxib]; and Azithromycin  Social History   Social History  . Marital status: Married    Spouse name: N/A  . Number of children: N/A  . Years of education: N/A   Occupational History  . Not on file.   Social History Main Topics  . Smoking status: Former Smoker    Types: Cigarettes    Quit date: 07/31/1989  . Smokeless tobacco: Never Used  . Alcohol use 0.6 oz/week    1 Glasses of wine per week     Comment: occasional wine  . Drug use: No  . Sexual activity: Not on file   Other Topics Concern  . Not on file   Social History Narrative  . No  narrative on file    Family History  Problem Relation Age of Onset  . Emphysema Father     deceased  . Heart disease Mother     deceased  . Colon cancer Paternal Grandmother   . Colon cancer Son   . Colon cancer Paternal Uncle     Review of Systems: Review of Systems  Constitutional: Negative.   HENT: Negative.   Eyes: Negative.   Respiratory: Positive for shortness of breath (with activity).   Cardiovascular: Positive for palpitations (1 episode 2-3 years ago).  Gastrointestinal: Negative.   Genitourinary: Negative.   Musculoskeletal: Positive for back pain and joint pain.  Skin: Negative.   Neurological: Negative.   Endo/Heme/Allergies: Bruises/bleeds easily.  Psychiatric/Behavioral: Negative.    --------------------------------------------------------------------------------------------------  Physical Exam: BP 138/88   Pulse 73   Ht 5\' 6"  (1.676 m)   Wt 147 lb 12.8 oz (67 kg)   LMP  (LMP Unknown)   BMI 23.86 kg/m   General:  Well-developed, well-nourished woman seated comfortably in the exam room. HEENT: No conjunctival pallor or scleral icterus.  Moist mucous membranes.  OP clear. Neck: Supple without lymphadenopathy, thyromegaly, JVD, or HJR.  No carotid bruit.  Lungs: Normal work of breathing.  Clear to auscultation bilaterally without wheezes or crackles. Heart: Regular rate and rhythm without murmurs, rubs, or gallops.  Non-displaced PMI. Abd: Bowel sounds present.  Soft, NT/ND without hepatosplenomegaly Ext: No lower extremity edema.  Radial, PT, and DP pulses are 2+ bilaterally Skin: warm and dry without rash Neuro: CNIII-XII intact.  Normal strength in upper and lower extremities bilaterally. Psych: Normal mood and affect.  I personally observe the patient walk up 3 flights of stairs without angina or significant shortness of breath.  EKG:  I have personally reviewed today's tracing as well as the prior tracing from 10/26/09.  Sinus rhythm with  occasional PACs.  LVH with non-specific T-wave changes.  PACs are new since 2011.  Otherwise, there has been no significant interval change.  Lab Results  Component Value Date   WBC 3.6 (L) 07/28/2016   HGB 13.2 07/28/2016   HCT 40.2 07/28/2016   MCV 97.8 07/28/2016   PLT 163 07/28/2016    Lab Results  Component Value Date   NA 140 07/28/2016   K 4.2 07/28/2016   CL 104 07/28/2016   CO2 29 07/28/2016   BUN 23 (H) 07/28/2016   CREATININE 0.92 07/28/2016   GLUCOSE 93 07/28/2016   ALT 24 09/24/2010   --------------------------------------------------------------------------------------------------  ASSESSMENT AND PLAN: 1. Preop cardiovascular exam Ms. Govern does not have any symptoms to suggest unstable cardiovascular disease. She has some exertional dyspnea that is unchanged over several years. I suspect this is predominantly due to her underlying lung disease. EKG today demonstrates LVH with T-wave abnormalities that may reflect abnormal repolarization and are similar to the prior tracing from 2011. She is able to complete more than 4 METs of activity without chest pain or significant dyspnea. There is no indication for further cardiovascular testing or intervention prior to undergoing elective orthopedic surgery. Based on the ACS NSQIP risk calculator, her perioperative cardiovascular risk is 0.3%.  2. Coronary artery disease involving native coronary artery of native heart without angina pectoris The patient does not have any new symptoms to suggest worsening coronary insufficiency. We will continue with her current regimen for secondary prevention, including low-dose aspirin and simvastatin 20 mg daily.  3. Essential hypertension Blood pressure is upper normal today, the patient reports that is typically much better at home. She should remain on her current regimen of valsartan and hydrochlorothiazide.  Follow-up: Return to clinic in 1 year.  [name],  MD 08/01/2016 3:23 PM

## 2016-08-04 NOTE — H&P (Signed)
TOTAL HIP ADMISSION H&P  Patient is admitted for right total hip arthroplasty.  Subjective:  Chief Complaint: right hip pain  HPI: Tiffany Caldwell, 75 y.o. female, has a history of pain and functional disability in the right hip(s) due to arthritis and patient has failed non-surgical conservative treatments for greater than 12 weeks to include NSAID's and/or analgesics, corticosteriod injections, flexibility and strengthening excercises, supervised PT with diminished ADL's post treatment, use of assistive devices, weight reduction as appropriate and activity modification.  Onset of symptoms was gradual starting 5 years ago with gradually worsening course since that time.The patient noted no past surgery on the right hip(s).  Patient currently rates pain in the right hip at 10 out of 10 with activity. Patient has night pain, worsening of pain with activity and weight bearing, trendelenberg gait, pain that interfers with activities of daily living and crepitus. Patient has evidence of subchondral cysts, subchondral sclerosis, periarticular osteophytes and joint space narrowing by imaging studies. This condition presents safety issues increasing the risk of falls. There is no current active infection.  Patient Active Problem List   Diagnosis Date Noted  . Pulmonary Mycobacterium avium infection ([place]) 05/01/2015  . Coronary artery disease 05/01/2015  . Hypertension 05/01/2015  . Dyslipidemia 05/01/2015  . Solitary pulmonary nodule 12/14/2014  . COPD (chronic obstructive pulmonary disease) ([place]) 12/14/2014  . History of breast cancer 07/25/2011  . Gallstones 10/31/2010  . CHRONIC RHINOSINUSITIS 09/24/2010  . EXTERNAL HEMORRHOIDS 09/23/2010  . DIVERTICULOSIS OF COLON 09/23/2010   Past Medical History:  Diagnosis Date  . Allergy   . Arthritis   . Breast cancer ([place])    left breast  . Dyspnea   . GERD (gastroesophageal reflux disease)   . Hyperlipidemia   . Hypertension   . Irritable  bowel syndrome (IBS)   . Lung disorder    leison  . Pinched nerve    back and left leg    Past Surgical History:  Procedure Laterality Date  . ABDOMINAL HYSTERECTOMY    . BREAST FIBROADENOMA SURGERY  01/13/1980  . BREAST SURGERY  02/01/2004   tram/mastectomyfor recurrence  . COLONOSCOPY    . CORONARY ARTERY BYPASS GRAFT  2001  . KNEE ARTHROSCOPY  January 2011   right  . MASTECTOMY PARTIAL / LUMPECTOMY W/ AXILLARY LYMPHADENECTOMY  04/10/1989   Dr Annamaria Boots / left breast  . PARTIAL HYSTERECTOMY     vaginal  . TONSILLECTOMY AND ADENOIDECTOMY    . UPPER GASTROINTESTINAL ENDOSCOPY      No prescriptions prior to admission.   Allergies  Allergen Reactions  . Amoxicillin Other (See Comments)    No reaction. Just doesn't work anymore  . Ciprofloxacin Nausea Only  . Levofloxacin Nausea Only  . Lisinopril     Fatigue   . Vioxx [Rofecoxib] Other (See Comments)    unknown  . Azithromycin Rash    Social History  Substance Use Topics  . Smoking status: Former Smoker    Types: Cigarettes    Quit date: 07/31/1989  . Smokeless tobacco: Never Used  . Alcohol use 0.6 oz/week    1 Glasses of wine per week     Comment: occasional wine    Family History  Problem Relation Age of Onset  . Emphysema Father     deceased  . Heart disease Mother     deceased  . Colon cancer Paternal Grandmother   . Colon cancer Son   . Colon cancer Paternal Uncle      Review of  Systems  Musculoskeletal: Positive for joint pain.       Right hip  All other systems reviewed and are negative.   Objective:  Physical Exam  Constitutional: She is oriented to person, place, and time. She appears well-developed and well-nourished.  HENT:  Head: Normocephalic and atraumatic.  Eyes: Pupils are equal, round, and reactive to light.  Neck: Normal range of motion.  Cardiovascular: Normal rate and regular rhythm.   Respiratory: Effort normal.  GI: Soft.  Musculoskeletal:  Right hip motion is extremely tender  to internal rotation.  Her leg lengths are equal.  Straight leg raise is negative today.  Sensation and motor function are intact in her feet with palpable pulses on both sides.  There is no palpable lymphadenopathy at the groin.   Neurological: She is alert and oriented to person, place, and time.  Skin: Skin is warm and dry.  Psychiatric: She has a normal mood and affect. Her behavior is normal. Judgment and thought content normal.    Vital signs in last 24 hours:    Labs:   Estimated body mass index is 23.86 kg/m as calculated from the following:   Height as of 08/01/16: 5\' 6"  (1.676 m).   Weight as of 08/01/16: 67 kg (147 lb 12.8 oz).   Imaging Review Plain radiographs demonstrate severe degenerative joint disease of the right hip(s). The bone quality appears to be good for age and reported activity level.  Assessment/Plan:  End stage primary arthritis, right hip(s)  The patient history, physical examination, clinical judgement of the provider and imaging studies are consistent with end stage degenerative joint disease of the right hip(s) and total hip arthroplasty is deemed medically necessary. The treatment options including medical management, injection therapy, arthroscopy and arthroplasty were discussed at length. The risks and benefits of total hip arthroplasty were presented and reviewed. The risks due to aseptic loosening, infection, stiffness, dislocation/subluxation,  thromboembolic complications and other imponderables were discussed.  The patient acknowledged the explanation, agreed to proceed with the plan and consent was signed. Patient is being admitted for inpatient treatment for surgery, pain control, PT, OT, prophylactic antibiotics, VTE prophylaxis, progressive ambulation and ADL's and discharge planning.The patient is planning to be discharged home with home health services

## 2016-08-04 NOTE — Progress Notes (Signed)
Pt. Has been seen by Dr. Saunders Revel, she will return to F/U with them/ cardiac grp. in one yr. according to his note.

## 2016-08-05 ENCOUNTER — Inpatient Hospital Stay (HOSPITAL_COMMUNITY): Payer: PPO | Admitting: Emergency Medicine

## 2016-08-05 ENCOUNTER — Encounter (HOSPITAL_COMMUNITY)
Admission: RE | Disposition: A | Discharge status: Home Health | Payer: Self-pay | Source: Ambulatory Visit | Attending: Orthopaedic Surgery

## 2016-08-05 ENCOUNTER — Inpatient Hospital Stay (HOSPITAL_COMMUNITY): Payer: PPO

## 2016-08-05 ENCOUNTER — Encounter (HOSPITAL_COMMUNITY): Payer: Self-pay | Admitting: Anesthesiology

## 2016-08-05 ENCOUNTER — Inpatient Hospital Stay (HOSPITAL_COMMUNITY): Payer: PPO | Admitting: Anesthesiology

## 2016-08-05 ENCOUNTER — Inpatient Hospital Stay (HOSPITAL_COMMUNITY)
Admission: RE | Admit: 2016-08-05 | Discharge: 2016-08-08 | DRG: 469 | Disposition: A | Discharge status: Home Health | Payer: PPO | Source: Ambulatory Visit | Attending: Orthopaedic Surgery | Admitting: Orthopaedic Surgery

## 2016-08-05 DIAGNOSIS — Y92239 Unspecified place in hospital as the place of occurrence of the external cause: Secondary | ICD-10-CM | POA: Diagnosis not present

## 2016-08-05 DIAGNOSIS — Z951 Presence of aortocoronary bypass graft: Secondary | ICD-10-CM

## 2016-08-05 DIAGNOSIS — J9601 Acute respiratory failure with hypoxia: Secondary | ICD-10-CM | POA: Diagnosis not present

## 2016-08-05 DIAGNOSIS — Z96641 Presence of right artificial hip joint: Secondary | ICD-10-CM | POA: Diagnosis not present

## 2016-08-05 DIAGNOSIS — D62 Acute posthemorrhagic anemia: Secondary | ICD-10-CM | POA: Diagnosis not present

## 2016-08-05 DIAGNOSIS — R0989 Other specified symptoms and signs involving the circulatory and respiratory systems: Secondary | ICD-10-CM

## 2016-08-05 DIAGNOSIS — E785 Hyperlipidemia, unspecified: Secondary | ICD-10-CM | POA: Diagnosis present

## 2016-08-05 DIAGNOSIS — M1611 Unilateral primary osteoarthritis, right hip: Principal | ICD-10-CM | POA: Diagnosis present

## 2016-08-05 DIAGNOSIS — K219 Gastro-esophageal reflux disease without esophagitis: Secondary | ICD-10-CM | POA: Diagnosis present

## 2016-08-05 DIAGNOSIS — Z825 Family history of asthma and other chronic lower respiratory diseases: Secondary | ICD-10-CM

## 2016-08-05 DIAGNOSIS — K589 Irritable bowel syndrome without diarrhea: Secondary | ICD-10-CM | POA: Diagnosis present

## 2016-08-05 DIAGNOSIS — Z87891 Personal history of nicotine dependence: Secondary | ICD-10-CM | POA: Diagnosis not present

## 2016-08-05 DIAGNOSIS — I10 Essential (primary) hypertension: Secondary | ICD-10-CM | POA: Diagnosis not present

## 2016-08-05 DIAGNOSIS — R5082 Postprocedural fever: Secondary | ICD-10-CM | POA: Diagnosis not present

## 2016-08-05 DIAGNOSIS — Z471 Aftercare following joint replacement surgery: Secondary | ICD-10-CM | POA: Diagnosis not present

## 2016-08-05 DIAGNOSIS — T402X5A Adverse effect of other opioids, initial encounter: Secondary | ICD-10-CM | POA: Diagnosis not present

## 2016-08-05 DIAGNOSIS — R0902 Hypoxemia: Secondary | ICD-10-CM | POA: Diagnosis not present

## 2016-08-05 DIAGNOSIS — R509 Fever, unspecified: Secondary | ICD-10-CM

## 2016-08-05 DIAGNOSIS — R400 Somnolence: Secondary | ICD-10-CM

## 2016-08-05 DIAGNOSIS — Z800 Family history of malignant neoplasm of digestive organs: Secondary | ICD-10-CM | POA: Diagnosis not present

## 2016-08-05 DIAGNOSIS — I2510 Atherosclerotic heart disease of native coronary artery without angina pectoris: Secondary | ICD-10-CM | POA: Diagnosis not present

## 2016-08-05 DIAGNOSIS — Z881 Allergy status to other antibiotic agents status: Secondary | ICD-10-CM

## 2016-08-05 DIAGNOSIS — D6959 Other secondary thrombocytopenia: Secondary | ICD-10-CM | POA: Diagnosis not present

## 2016-08-05 DIAGNOSIS — Z419 Encounter for procedure for purposes other than remedying health state, unspecified: Secondary | ICD-10-CM

## 2016-08-05 DIAGNOSIS — Z853 Personal history of malignant neoplasm of breast: Secondary | ICD-10-CM

## 2016-08-05 DIAGNOSIS — M169 Osteoarthritis of hip, unspecified: Secondary | ICD-10-CM | POA: Diagnosis not present

## 2016-08-05 DIAGNOSIS — G9340 Encephalopathy, unspecified: Secondary | ICD-10-CM | POA: Diagnosis not present

## 2016-08-05 HISTORY — DX: Emphysema, unspecified: J43.9

## 2016-08-05 HISTORY — PX: TOTAL HIP ARTHROPLASTY: SHX124

## 2016-08-05 SURGERY — ARTHROPLASTY, HIP, TOTAL, ANTERIOR APPROACH
Anesthesia: Monitor Anesthesia Care | Site: Hip | Laterality: Right

## 2016-08-05 MED ORDER — GABAPENTIN 300 MG PO CAPS
300.0000 mg | ORAL_CAPSULE | Freq: Three times a day (TID) | ORAL | Status: DC
  Administered 2016-08-05 – 2016-08-08 (×7): 300 mg via ORAL
  Filled 2016-08-05 (×8): qty 1

## 2016-08-05 MED ORDER — BUPIVACAINE-EPINEPHRINE (PF) 0.5% -1:200000 IJ SOLN
INTRAMUSCULAR | Status: DC | PRN
  Administered 2016-08-05: 10 mL

## 2016-08-05 MED ORDER — CHLORHEXIDINE GLUCONATE 4 % EX LIQD: 60.0000 mL | Freq: Once | CUTANEOUS | Status: DC

## 2016-08-05 MED ORDER — PROPOFOL 500 MG/50ML IV EMUL
INTRAVENOUS | Status: AC
  Filled 2016-08-05: qty 100

## 2016-08-05 MED ORDER — BUPIVACAINE LIPOSOME 1.3 % IJ SUSP
20.0000 mL | INTRAMUSCULAR | Status: AC
  Administered 2016-08-05: 20 mL
  Filled 2016-08-05: qty 20

## 2016-08-05 MED ORDER — EPHEDRINE SULFATE 50 MG/ML IJ SOLN
INTRAMUSCULAR | Status: DC | PRN
  Administered 2016-08-05 (×4): 10 mg via INTRAVENOUS

## 2016-08-05 MED ORDER — ACETAMINOPHEN 325 MG PO TABS: 650.0000 mg | ORAL_TABLET | Freq: Four times a day (QID) | ORAL | Status: DC | PRN

## 2016-08-05 MED ORDER — EPHEDRINE 5 MG/ML INJ
INTRAVENOUS | Status: AC
  Filled 2016-08-05: qty 10

## 2016-08-05 MED ORDER — PHENOL 1.4 % MT LIQD
1.0000 | OROMUCOSAL | Status: DC | PRN
Start: 2016-08-05 — End: 2016-08-08

## 2016-08-05 MED ORDER — PROPOFOL 10 MG/ML IV BOLUS
INTRAVENOUS | Status: AC
  Filled 2016-08-05: qty 40

## 2016-08-05 MED ORDER — ONDANSETRON HCL 4 MG/2ML IJ SOLN
INTRAMUSCULAR | Status: AC
  Filled 2016-08-05: qty 2

## 2016-08-05 MED ORDER — PROPOFOL 10 MG/ML IV BOLUS
INTRAVENOUS | Status: DC | PRN
  Administered 2016-08-05: 20 mg via INTRAVENOUS

## 2016-08-05 MED ORDER — TRANEXAMIC ACID 1000 MG/10ML IV SOLN
1000.0000 mg | Freq: Once | INTRAVENOUS | Status: AC
  Administered 2016-08-05: 1000 mg via INTRAVENOUS
  Filled 2016-08-05: qty 10

## 2016-08-05 MED ORDER — HYOSCYAMINE SULFATE ER 0.375 MG PO TB12
0.3750 mg | ORAL_TABLET | Freq: Two times a day (BID) | ORAL | Status: DC | PRN
  Filled 2016-08-05: qty 1

## 2016-08-05 MED ORDER — MIDAZOLAM HCL 2 MG/2ML IJ SOLN
INTRAMUSCULAR | Status: AC
  Filled 2016-08-05: qty 2

## 2016-08-05 MED ORDER — TRANEXAMIC ACID 1000 MG/10ML IV SOLN
1000.0000 mg | INTRAVENOUS | Status: AC
  Administered 2016-08-05: 1000 mg via INTRAVENOUS
  Filled 2016-08-05: qty 10

## 2016-08-05 MED ORDER — PROPOFOL 500 MG/50ML IV EMUL
INTRAVENOUS | Status: DC | PRN
  Administered 2016-08-05: 25 ug/kg/min via INTRAVENOUS

## 2016-08-05 MED ORDER — ALUM & MAG HYDROXIDE-SIMETH 200-200-20 MG/5ML PO SUSP: 30.0000 mL | ORAL | Status: DC | PRN

## 2016-08-05 MED ORDER — DOCUSATE SODIUM 100 MG PO CAPS: 500.0000 mg | ORAL_CAPSULE | Freq: Every day | ORAL | Status: DC

## 2016-08-05 MED ORDER — LORATADINE 10 MG PO TABS
10.0000 mg | ORAL_TABLET | Freq: Every day | ORAL | Status: DC
  Administered 2016-08-05 – 2016-08-08 (×3): 10 mg via ORAL
  Filled 2016-08-05 (×4): qty 1

## 2016-08-05 MED ORDER — MENTHOL 3 MG MT LOZG
1.0000 | LOZENGE | OROMUCOSAL | Status: DC | PRN
  Administered 2016-08-06: 3 mg via ORAL
  Filled 2016-08-05: qty 9

## 2016-08-05 MED ORDER — IRBESARTAN 150 MG PO TABS
75.0000 mg | ORAL_TABLET | Freq: Every day | ORAL | Status: DC
  Administered 2016-08-07: 75 mg via ORAL
  Filled 2016-08-05 (×2): qty 1

## 2016-08-05 MED ORDER — HYDROCHLOROTHIAZIDE 12.5 MG PO CAPS
12.5000 mg | ORAL_CAPSULE | Freq: Every day | ORAL | Status: DC
  Administered 2016-08-07: 12.5 mg via ORAL
  Filled 2016-08-05 (×2): qty 1

## 2016-08-05 MED ORDER — FENTANYL CITRATE (PF) 100 MCG/2ML IJ SOLN
INTRAMUSCULAR | Status: AC
  Filled 2016-08-05: qty 2

## 2016-08-05 MED ORDER — LACTATED RINGERS IV SOLN
INTRAVENOUS | Status: DC
  Administered 2016-08-05 – 2016-08-06 (×2): via INTRAVENOUS

## 2016-08-05 MED ORDER — METOCLOPRAMIDE HCL 5 MG PO TABS: 5.0000 mg | ORAL_TABLET | Freq: Three times a day (TID) | ORAL | Status: DC | PRN

## 2016-08-05 MED ORDER — METHOCARBAMOL 500 MG PO TABS
500.0000 mg | ORAL_TABLET | Freq: Four times a day (QID) | ORAL | Status: DC | PRN
  Administered 2016-08-05: 500 mg via ORAL
  Filled 2016-08-05: qty 1

## 2016-08-05 MED ORDER — ASPIRIN EC 325 MG PO TBEC
325.0000 mg | DELAYED_RELEASE_TABLET | Freq: Two times a day (BID) | ORAL | Status: DC
  Administered 2016-08-06 – 2016-08-08 (×5): 325 mg via ORAL
  Filled 2016-08-05 (×5): qty 1

## 2016-08-05 MED ORDER — BUPIVACAINE HCL (PF) 0.75 % IJ SOLN
INTRAMUSCULAR | Status: DC | PRN
  Administered 2016-08-05: 1.5 mL via INTRATHECAL

## 2016-08-05 MED ORDER — PHENYLEPHRINE 40 MCG/ML (10ML) SYRINGE FOR IV PUSH (FOR BLOOD PRESSURE SUPPORT)
PREFILLED_SYRINGE | INTRAVENOUS | Status: AC
  Filled 2016-08-05: qty 10

## 2016-08-05 MED ORDER — VANCOMYCIN HCL IN DEXTROSE 1-5 GM/200ML-% IV SOLN
1000.0000 mg | INTRAVENOUS | Status: AC
  Administered 2016-08-05: 500 mg via INTRAVENOUS
  Filled 2016-08-05: qty 200

## 2016-08-05 MED ORDER — DOCUSATE SODIUM 100 MG PO CAPS
100.0000 mg | ORAL_CAPSULE | Freq: Two times a day (BID) | ORAL | Status: DC
  Administered 2016-08-05 – 2016-08-08 (×6): 100 mg via ORAL
  Filled 2016-08-05 (×7): qty 1

## 2016-08-05 MED ORDER — NALOXONE HCL 0.4 MG/ML IJ SOLN
INTRAMUSCULAR | Status: AC
  Administered 2016-08-05: 0.2 mg
  Filled 2016-08-05: qty 1

## 2016-08-05 MED ORDER — METOCLOPRAMIDE HCL 5 MG/ML IJ SOLN: 5.0000 mg | Freq: Three times a day (TID) | INTRAMUSCULAR | Status: DC | PRN

## 2016-08-05 MED ORDER — HYDROMORPHONE HCL 1 MG/ML IJ SOLN: 0.2500 mg | INTRAMUSCULAR | Status: DC | PRN

## 2016-08-05 MED ORDER — LACTATED RINGERS IV SOLN
INTRAVENOUS | Status: DC | PRN
  Administered 2016-08-05 (×2): via INTRAVENOUS

## 2016-08-05 MED ORDER — LIDOCAINE 2% (20 MG/ML) 5 ML SYRINGE
INTRAMUSCULAR | Status: AC
  Filled 2016-08-05: qty 5

## 2016-08-05 MED ORDER — ONDANSETRON HCL 4 MG PO TABS: 4.0000 mg | ORAL_TABLET | Freq: Four times a day (QID) | ORAL | Status: DC | PRN

## 2016-08-05 MED ORDER — SIMVASTATIN 20 MG PO TABS
20.0000 mg | ORAL_TABLET | Freq: Every day | ORAL | Status: DC
  Administered 2016-08-06 – 2016-08-07 (×3): 20 mg via ORAL
  Filled 2016-08-05 (×3): qty 1

## 2016-08-05 MED ORDER — ONDANSETRON HCL 4 MG/2ML IJ SOLN
INTRAMUSCULAR | Status: DC | PRN
  Administered 2016-08-05: 4 mg via INTRAVENOUS

## 2016-08-05 MED ORDER — PHENYLEPHRINE HCL 10 MG/ML IJ SOLN
INTRAMUSCULAR | Status: DC | PRN
  Administered 2016-08-05 (×2): 80 ug via INTRAVENOUS

## 2016-08-05 MED ORDER — TRANEXAMIC ACID 1000 MG/10ML IV SOLN
2000.0000 mg | INTRAVENOUS | Status: AC
  Administered 2016-08-05: 2000 mg via TOPICAL
  Filled 2016-08-05: qty 20

## 2016-08-05 MED ORDER — PROPOFOL 1000 MG/100ML IV EMUL
INTRAVENOUS | Status: AC
  Filled 2016-08-05: qty 200

## 2016-08-05 MED ORDER — 0.9 % SODIUM CHLORIDE (POUR BTL) OPTIME
TOPICAL | Status: DC | PRN
  Administered 2016-08-05: 1000 mL

## 2016-08-05 MED ORDER — CEFAZOLIN SODIUM 1 G IJ SOLR
INTRAMUSCULAR | Status: DC | PRN
  Administered 2016-08-05: 2 g via INTRAMUSCULAR

## 2016-08-05 MED ORDER — PROMETHAZINE HCL 25 MG/ML IJ SOLN: 6.2500 mg | INTRAMUSCULAR | Status: DC | PRN

## 2016-08-05 MED ORDER — BISACODYL 5 MG PO TBEC: 5.0000 mg | DELAYED_RELEASE_TABLET | Freq: Every day | ORAL | Status: DC | PRN

## 2016-08-05 MED ORDER — NALOXONE HCL 0.4 MG/ML IJ SOLN
0.4000 mg | INTRAMUSCULAR | Status: DC | PRN
  Administered 2016-08-06: 0.4 mg via INTRAVENOUS
  Filled 2016-08-05: qty 1

## 2016-08-05 MED ORDER — FENTANYL CITRATE (PF) 100 MCG/2ML IJ SOLN
INTRAMUSCULAR | Status: DC | PRN
  Administered 2016-08-05 (×2): 50 ug via INTRAVENOUS

## 2016-08-05 MED ORDER — HYDROCODONE-ACETAMINOPHEN 5-325 MG PO TABS
1.0000 | ORAL_TABLET | ORAL | Status: DC | PRN
  Administered 2016-08-05: 2 via ORAL
  Filled 2016-08-05: qty 2

## 2016-08-05 MED ORDER — DIPHENHYDRAMINE HCL 12.5 MG/5ML PO ELIX: 12.5000 mg | ORAL_SOLUTION | ORAL | Status: DC | PRN

## 2016-08-05 MED ORDER — DEXTROSE 5 % IV SOLN
INTRAVENOUS | Status: DC | PRN
  Administered 2016-08-05 (×2): 10 ug/min via INTRAVENOUS

## 2016-08-05 MED ORDER — METHOCARBAMOL 1000 MG/10ML IJ SOLN
500.0000 mg | Freq: Four times a day (QID) | INTRAMUSCULAR | Status: DC | PRN
  Filled 2016-08-05: qty 5

## 2016-08-05 MED ORDER — ACETAMINOPHEN 650 MG RE SUPP
650.0000 mg | Freq: Four times a day (QID) | RECTAL | Status: DC | PRN
Start: 2016-08-05 — End: 2016-08-06

## 2016-08-05 MED ORDER — CEFAZOLIN SODIUM-DEXTROSE 2-4 GM/100ML-% IV SOLN
2.0000 g | Freq: Four times a day (QID) | INTRAVENOUS | Status: AC
  Administered 2016-08-05 (×2): 2 g via INTRAVENOUS
  Filled 2016-08-05 (×2): qty 100

## 2016-08-05 MED ORDER — HYDROMORPHONE HCL 2 MG/ML IJ SOLN
0.5000 mg | INTRAMUSCULAR | Status: DC | PRN
  Administered 2016-08-05 (×2): 1 mg via INTRAVENOUS
  Filled 2016-08-05 (×2): qty 1

## 2016-08-05 MED ORDER — VALSARTAN-HYDROCHLOROTHIAZIDE 80-12.5 MG PO TABS: 1.0000 | ORAL_TABLET | Freq: Every day | ORAL | Status: DC

## 2016-08-05 MED ORDER — ONDANSETRON HCL 4 MG/2ML IJ SOLN
4.0000 mg | Freq: Four times a day (QID) | INTRAMUSCULAR | Status: DC | PRN
  Administered 2016-08-05: 4 mg via INTRAVENOUS
  Filled 2016-08-05: qty 2

## 2016-08-05 SURGICAL SUPPLY — 51 items
BLADE SAW SGTL 18X1.27X75 (BLADE) ×2 IMPLANT
BLADE SAW SGTL 18X1.27X75MM (BLADE) ×1
BLADE SURG ROTATE 9660 (MISCELLANEOUS) IMPLANT
CAPT HIP TOTAL 2 ×3 IMPLANT
CELLS DAT CNTRL 66122 CELL SVR (MISCELLANEOUS) ×1 IMPLANT
CLOSURE WOUND 1/2 X4 (GAUZE/BANDAGES/DRESSINGS) ×1
COVER PERINEAL POST (MISCELLANEOUS) ×3 IMPLANT
COVER SURGICAL LIGHT HANDLE (MISCELLANEOUS) ×3 IMPLANT
DRAPE C-ARM 42X72 X-RAY (DRAPES) ×3 IMPLANT
DRAPE IMP U-DRAPE 54X76 (DRAPES) IMPLANT
DRAPE STERI IOBAN 125X83 (DRAPES) ×3 IMPLANT
DRAPE U-SHAPE 47X51 STRL (DRAPES) ×6 IMPLANT
DRSG AQUACEL AG ADV 3.5X10 (GAUZE/BANDAGES/DRESSINGS) ×3 IMPLANT
DURAPREP 26ML APPLICATOR (WOUND CARE) ×3 IMPLANT
ELECT BLADE 4.0 EZ CLEAN MEGAD (MISCELLANEOUS) ×3
ELECT CAUTERY BLADE 6.4 (BLADE) IMPLANT
ELECT REM PT RETURN 9FT ADLT (ELECTROSURGICAL) ×3
ELECTRODE BLDE 4.0 EZ CLN MEGD (MISCELLANEOUS) ×1 IMPLANT
ELECTRODE REM PT RTRN 9FT ADLT (ELECTROSURGICAL) ×1 IMPLANT
FACESHIELD WRAPAROUND (MASK) ×9 IMPLANT
GLOVE BIO SURGEON STRL SZ8 (GLOVE) ×6 IMPLANT
GLOVE BIOGEL PI IND STRL 8 (GLOVE) ×2 IMPLANT
GLOVE BIOGEL PI INDICATOR 8 (GLOVE) ×4
GOWN STRL REUS W/ TWL LRG LVL3 (GOWN DISPOSABLE) ×1 IMPLANT
GOWN STRL REUS W/ TWL XL LVL3 (GOWN DISPOSABLE) ×2 IMPLANT
GOWN STRL REUS W/TWL LRG LVL3 (GOWN DISPOSABLE) ×2
GOWN STRL REUS W/TWL XL LVL3 (GOWN DISPOSABLE) ×4
KIT BASIN OR (CUSTOM PROCEDURE TRAY) ×3 IMPLANT
KIT ROOM TURNOVER OR (KITS) ×3 IMPLANT
MANIFOLD NEPTUNE II (INSTRUMENTS) ×3 IMPLANT
NEEDLE HYPO 22GX1.5 SAFETY (NEEDLE) ×3 IMPLANT
NS IRRIG 1000ML POUR BTL (IV SOLUTION) ×3 IMPLANT
PACK TOTAL JOINT (CUSTOM PROCEDURE TRAY) ×3 IMPLANT
PAD ARMBOARD 7.5X6 YLW CONV (MISCELLANEOUS) ×6 IMPLANT
RTRCTR WOUND ALEXIS 18CM MED (MISCELLANEOUS) ×3
STAPLER VISISTAT 35W (STAPLE) IMPLANT
STRIP CLOSURE SKIN 1/2X4 (GAUZE/BANDAGES/DRESSINGS) ×2 IMPLANT
SUT ETHIBOND NAB CT1 #1 30IN (SUTURE) ×12 IMPLANT
SUT MNCRL AB 3-0 PS2 18 (SUTURE) ×3 IMPLANT
SUT VIC AB 0 CT1 27 (SUTURE) ×2
SUT VIC AB 0 CT1 27XBRD ANBCTR (SUTURE) ×1 IMPLANT
SUT VIC AB 1 CT1 27 (SUTURE) ×2
SUT VIC AB 1 CT1 27XBRD ANBCTR (SUTURE) ×1 IMPLANT
SUT VIC AB 2-0 CT1 27 (SUTURE) ×2
SUT VIC AB 2-0 CT1 TAPERPNT 27 (SUTURE) ×1 IMPLANT
SUT VLOC 180 0 24IN GS25 (SUTURE) ×3 IMPLANT
SYR 50ML LL SCALE MARK (SYRINGE) ×3 IMPLANT
TOWEL OR 17X24 6PK STRL BLUE (TOWEL DISPOSABLE) ×3 IMPLANT
TOWEL OR 17X26 10 PK STRL BLUE (TOWEL DISPOSABLE) ×6 IMPLANT
TRAY FOLEY CATH 14FR (SET/KITS/TRAYS/PACK) IMPLANT
WATER STERILE IRR 1000ML POUR (IV SOLUTION) ×6 IMPLANT

## 2016-08-05 NOTE — Op Note (Signed)
PRE-OP DIAGNOSIS:  RIGHT HIP DEGENERATIVE JOINT DISEASE POST-OP DIAGNOSIS:  same PROCEDURE: RIGHT TOTAL HIP ARTHROPLASTY ANTERIOR APPROACH ANESTHESIA:  Spinal SURGEON:  [name] MD ASSISTANT:  Loni Dolly PA-C   INDICATIONS FOR PROCEDURE:  The patient is a 75 y.o. female with a long history of a painful hip.  This has persisted despite multiple conservative measures.  The patient has persisted with pain and dysfunction making rest and activity difficult.  A total hip replacement is offered as surgical treatment.  Informed operative consent was obtained after discussion of possible complications including reaction to anesthesia, infection, neurovascular injury, dislocation, DVT, PE, and death.  The importance of the postoperative rehab program to optimize result was stressed with the patient.  SUMMARY OF FINDINGS AND PROCEDURE:  Under general anesthesia through a anterior approach an the Hana table a right THR was performed.  The patient had severe degenerative change and good bone quality.  We used DePuy components to replace the hip and these were size KLA 12 Corail femur capped with a +1.5 36 mm ceramic hip ball.  On the acetabular side we used a size 52 Gription shell with a  plus 0 neutral polyethylene liner.  We did use a hole eliminator.  Loni Dolly PA-C assisted throughout and was invaluable to the completion of the case in that he helped position and retract while I performed the procedure.  He also closed simultaneously to help minimize OR time.  I used fluoroscopy throughout the case to check position of implants and leg lengths and read all of these views myself.  DESCRIPTION OF PROCEDURE:  The patient was taken to the OR suite where general anesthetic was applied.  The patient was then positioned on the Hana table supine.  All bony prominences were appropriately padded.  Prep and drape was then performed in normal sterile fashion.  The patient was given kefzol preoperative antibiotic  and an appropriate time out was performed.  We then took an anterior approach to the right hip.  Dissection was taken through adipose to the tensor fascia lata fascia.  This structure was incised longitudinally and we dissected in the intermuscular interval just medial to this muscle.  Cobra retractors were placed superior and inferior to the femoral neck superficial to the capsule.  A capsular incision was then made and the retractors were placed along the femoral neck.  Xray was brought in to get a good level for the femoral neck cut which was made with an oscillating saw and osteotome.  The femoral head was removed with a corkscrew.  The acetabulum was exposed and some labral tissues were excised. Reaming was taken to the inside wall of the pelvis and sequentially up to 1 mm smaller than the actual component.  A trial of components was done and then the aforementioned acetabular shell was placed in appropriate tilt and anteversion confirmed by fluoroscopy. The liner was placed along with the hole eliminator and attention was turned to the femur.  The leg was brought down and over into adduction and the elevator bar was used to raise the femur up gently in the wound.  The piriformis was released with care taken to preserve the obturator internus attachment and all of the posterior capsule. The femur was reamed and then broached to the appropriate size.  A trial reduction was done and the aforementioned head and neck assembly gave [place] the best stability in extension with external rotation.  Leg lengths were felt to be about equal by fluoroscopic  exam.  The trial components were removed and the wound irrigated.  We then placed the femoral component in appropriate anteversion.  The head was applied to a dry stem neck and the hip again reduced.  It was again stable in the aforementioned position.  The would was irrigated again followed by re-approximation of anterior capsule with ethibond suture. Tensor fascia was  repaired with V-loc suture  followed by deep closure with #O and #2 undyed vicryl.  Skin was closed with subQ stitch and steristrips followed by a sterile dressing.  EBL and IOF can be obtained from anesthesia records.  DISPOSITION:  The patient was extubated in the OR and taken to PACU in stable condition to be admitted to the Orthopedic Surgery for appropriate post-op care to include perioperative antibiotics and DVT prophylaxis.

## 2016-08-05 NOTE — Anesthesia Preprocedure Evaluation (Addendum)
Anesthesia Evaluation  Patient identified by MRN, date of birth, ID band Patient awake    Reviewed: Allergy & Precautions, NPO status , Patient's Chart, lab work & pertinent test results  History of Anesthesia Complications Negative for: history of anesthetic complications  Airway Mallampati: II  TM Distance: <3 FB Neck ROM: Full    Dental  (+) Teeth Intact, Caps,    Pulmonary COPD, former smoker,    breath sounds clear to auscultation       Cardiovascular hypertension, + CAD   Rhythm:Regular Rate:Normal     Neuro/Psych    GI/Hepatic Neg liver ROS, GERD  ,  Endo/Other  negative endocrine ROS  Renal/GU negative Renal ROS     Musculoskeletal   Abdominal   Peds  Hematology   Anesthesia Other Findings   Reproductive/Obstetrics                            Anesthesia Physical Anesthesia Plan  ASA: II  Anesthesia Plan: Spinal   Post-op Pain Management:    Induction: Intravenous  Airway Management Planned: Simple Face Mask  Additional Equipment:   Intra-op Plan:   Post-operative Plan:   Informed Consent:   Plan Discussed with: CRNA  Anesthesia Plan Comments:        Anesthesia Quick Evaluation

## 2016-08-05 NOTE — Transfer of Care (Signed)
Immediate Anesthesia Transfer of Care Note  Patient: Tiffany Caldwell  Procedure(s) Performed: Procedure(s): TOTAL HIP ARTHROPLASTY ANTERIOR APPROACH (Right)  Patient Location: PACU  Anesthesia Type:MAC and Spinal  Level of Consciousness: awake, alert , oriented and sedated  Airway & Oxygen Therapy: Patient Spontanous Breathing and Patient connected to nasal cannula oxygen  Post-op Assessment: Report given to RN, Post -op Vital signs reviewed and stable and Patient moving all extremities  Post vital signs: Reviewed and stable  Last Vitals:  Vitals:   08/05/16 0609 08/05/16 0945  BP: 138/67 (!) 82/62  Pulse: 63 69  Resp: 18 11  Temp: 36.7 C 36.2 C    Last Pain:  Vitals:   08/05/16 0609  TempSrc: Oral         Complications: No apparent anesthesia complications

## 2016-08-05 NOTE — Evaluation (Signed)
Physical Therapy Evaluation Patient Details Name: Tiffany Caldwell MRN: [id number] DOB: Apr 12, 1941 Today's Date: 08/05/2016   History of Present Illness  Patient is a 75 y/o female with hx of HTN, HLD, breast ca presents s/p right THA, direct anterior approach.  Clinical Impression  Patient presents with pain and post surgical deficits s/p above surgery. Tolerated gait training and transfers with min guard assist for safety. Education re: exercises, positioning, etc. Pt will have support from spouse at home for ~1 week. Will need to negotiate 1 flight of steps to get to bedroom. Will follow acutely to maximize independence and mobility prior to return home.     Follow Up Recommendations Home health PT;Supervision for mobility/OOB    Equipment Recommendations  Rolling walker with 5" wheels    Recommendations for Other Services       Precautions / Restrictions Restrictions Weight Bearing Restrictions: Yes RLE Weight Bearing: Weight bearing as tolerated      Mobility  Bed Mobility Overal bed mobility: Needs Assistance Bed Mobility: Supine to Sit     Supine to sit: Supervision;HOB elevated     General bed mobility comments: No physical assist needed. Use of rail. Increased time.  Transfers Overall transfer level: Needs assistance Equipment used: Rolling walker (2 wheeled) Transfers: Sit to/from Stand Sit to Stand: Min guard         General transfer comment: Min guard for safety. Stood from [employer], from toilet x1. Transferred to chair post ambulation bout.  Ambulation/Gait Ambulation/Gait assistance: Min guard Ambulation Distance (Feet): 120 Feet Assistive device: Rolling walker (2 wheeled) Gait Pattern/deviations: Step-through pattern;Decreased stride length;Narrow base of support;Trunk flexed Gait velocity: decreased   General Gait Details: Slow, steady gait with cues for step through gait pattern and upright posture.   Stairs            Wheelchair  Mobility    Modified Rankin (Stroke Patients Only)       Balance Overall balance assessment: Needs assistance Sitting-balance support: No upper extremity supported;Feet supported Sitting balance-Leahy Scale: Good     Standing balance support: During functional activity;Bilateral upper extremity supported Standing balance-Leahy Scale: Poor Standing balance comment: Reliant on BUEs for support ins tanding.                             Pertinent Vitals/Pain Pain Assessment: 0-10 Pain Score: 7  Pain Location: right hip Pain Descriptors / Indicators: Sore;Operative site guarding Pain Intervention(s): Monitored during session;Repositioned;Premedicated before session    [place] expects to be discharged to:: Private residence Living Arrangements: Spouse/significant other Available Help at Discharge: Family;Available 24 hours/day (spouse will be home for 1 week) Type of Home: House Home Access: Level entry     Home Layout: Two level;Bed/bath upstairs Home Equipment: None      Prior Function Level of Independence: Independent         Comments: Cooks, cleans drives.      Hand Dominance        Extremity/Trunk Assessment   Upper Extremity Assessment: Defer to OT evaluation           Lower Extremity Assessment: RLE deficits/detail RLE Deficits / Details: Able to perform LAQ without difficulty. Post op limitations due to pain       Communication   Communication: No difficulties  Cognition Arousal/Alertness: Awake/alert Behavior During Therapy: WFL for tasks assessed/performed Overall Cognitive Status: Within Functional Limits for tasks assessed  General Comments General comments (skin integrity, edema, etc.): Spouse and daughter present during session.    Exercises Total Joint Exercises Ankle Circles/Pumps: Both;10 reps;Supine Quad Sets: Both;10 reps;Supine Gluteal Sets: Both;10 reps;Supine    Assessment/Plan    PT Assessment Patient needs continued PT services  PT Problem List Decreased strength;Decreased mobility;Decreased range of motion;Decreased activity tolerance;Decreased balance;Pain          PT Treatment Interventions DME instruction;Therapeutic activities;Therapeutic exercise;Gait training;Patient/family education;Balance training;Stair training;Functional mobility training    PT Goals (Current goals can be found in the Care Plan section)  Acute Rehab PT Goals Patient Stated Goal: to be able to get upstairs to my bedroom PT Goal Formulation: With patient Time For Goal Achievement: 08/19/16 Potential to Achieve Goals: Good    Frequency 7X/week   Barriers to discharge Inaccessible home environment stairs to get to bedroom    Co-evaluation               End of Session Equipment Utilized During Treatment: Gait belt Activity Tolerance: Patient tolerated treatment well Patient left: in chair;with call bell/phone within reach;with family/visitor present Nurse Communication: Mobility status         Time: [id number] PT Time Calculation (min) (ACUTE ONLY): 23 min   Charges:   PT Evaluation $PT Eval Low Complexity: 1 Procedure PT Treatments $Gait Training: 8-22 mins   PT G Codes:        Joleah Kosak A Melanny Wire 08/05/2016, 2:46 PM Wray Kearns, [place], DPT [phone]

## 2016-08-05 NOTE — Anesthesia Postprocedure Evaluation (Signed)
Anesthesia Post Note  Patient: CHERREE GURKA  Procedure(s) Performed: Procedure(s) (LRB): TOTAL HIP ARTHROPLASTY ANTERIOR APPROACH (Right)  Patient location during evaluation: PACU Anesthesia Type: Spinal Level of consciousness: oriented and awake and alert Pain management: pain level controlled Vital Signs Assessment: post-procedure vital signs reviewed and stable Respiratory status: spontaneous breathing, respiratory function stable and patient connected to nasal cannula oxygen Cardiovascular status: blood pressure returned to baseline and stable Postop Assessment: no headache and no backache Anesthetic complications: no    Last Vitals:  Vitals:   08/05/16 1030 08/05/16 1045  BP: (!) 88/59   Pulse: (!) 56 (!) 58  Resp: 11 13  Temp:      Last Pain:  Vitals:   08/05/16 0609  TempSrc: Oral    LLE Motor Response: No movement due to regional block (08/05/16 1045)   RLE Motor Response: No movement due to regional block (08/05/16 1045)   L Sensory Level: T12-Inguinal (groin) region (08/05/16 1045) R Sensory Level: T12-Inguinal (groin) region (08/05/16 1045)  Quentavious Rittenhouse,JAMES TERRILL

## 2016-08-05 NOTE — Anesthesia Procedure Notes (Signed)
Spinal  Patient location during procedure: OR Start time: 08/05/2016 7:30 AM End time: 08/05/2016 7:35 AM Staffing Anesthesiologist: Rica Koyanagi Performed: anesthesiologist  Preanesthetic Checklist Completed: patient identified, site marked, surgical consent, pre-op evaluation, timeout performed, IV checked, risks and benefits discussed and monitors and equipment checked Spinal Block Prep: ChloraPrep Patient monitoring: cardiac monitor, continuous pulse ox and blood pressure Approach: midline Location: L3-4 Injection technique: single-shot Needle Needle type: Pencan  Needle gauge: 24 G Needle length: 9 cm Needle insertion depth: 4 cm Assessment Sensory level: T6 Additional Notes Tolerated well

## 2016-08-05 NOTE — Interval H&P Note (Signed)
History and Physical Interval Note:  08/05/2016 7:16 AM  Tiffany Caldwell  has presented today for surgery, with the diagnosis of RIGHT HIP DEGENERATIVE JOINT DISEASE  The various methods of treatment have been discussed with the patient and family. After consideration of risks, benefits and other options for treatment, the patient has consented to  Procedure(s): TOTAL HIP ARTHROPLASTY ANTERIOR APPROACH (Right) as a surgical intervention .  The patient's history has been reviewed, patient examined, no change in status, stable for surgery.  I have reviewed the patient's chart and labs.  Questions were answered to the patient's satisfaction.     Tiffany Caldwell G

## 2016-08-06 ENCOUNTER — Inpatient Hospital Stay (HOSPITAL_COMMUNITY): Payer: PPO

## 2016-08-06 ENCOUNTER — Encounter (HOSPITAL_COMMUNITY): Payer: Self-pay | Admitting: Orthopaedic Surgery

## 2016-08-06 DIAGNOSIS — R509 Fever, unspecified: Secondary | ICD-10-CM | POA: Diagnosis not present

## 2016-08-06 DIAGNOSIS — R531 Weakness: Secondary | ICD-10-CM | POA: Diagnosis not present

## 2016-08-06 DIAGNOSIS — R0902 Hypoxemia: Secondary | ICD-10-CM

## 2016-08-06 DIAGNOSIS — M1611 Unilateral primary osteoarthritis, right hip: Secondary | ICD-10-CM | POA: Diagnosis not present

## 2016-08-06 DIAGNOSIS — R0989 Other specified symptoms and signs involving the circulatory and respiratory systems: Secondary | ICD-10-CM

## 2016-08-06 DIAGNOSIS — R400 Somnolence: Secondary | ICD-10-CM

## 2016-08-06 LAB — CBC
HCT: 31.2 % — ABNORMAL LOW (ref 36.0–46.0)
Hemoglobin: 10 g/dL — ABNORMAL LOW (ref 12.0–15.0)
MCH: 31.8 pg (ref 26.0–34.0)
MCHC: 32.1 g/dL (ref 30.0–36.0)
MCV: 99.4 fL (ref 78.0–100.0)
PLATELETS: 125 10*3/uL — AB (ref 150–400)
RBC: 3.14 MIL/uL — ABNORMAL LOW (ref 3.87–5.11)
RDW: 12.9 % (ref 11.5–15.5)
WBC: 5.9 10*3/uL (ref 4.0–10.5)

## 2016-08-06 LAB — BASIC METABOLIC PANEL
Anion gap: 4 — ABNORMAL LOW (ref 5–15)
BUN: 9 mg/dL (ref 6–20)
CALCIUM: 8.8 mg/dL — AB (ref 8.9–10.3)
CO2: 30 mmol/L (ref 22–32)
CREATININE: 0.77 mg/dL (ref 0.44–1.00)
Chloride: 103 mmol/L (ref 101–111)
GFR calc Af Amer: >60 mL/min (ref >60)
GLUCOSE: 127 mg/dL — AB (ref 65–99)
Potassium: 4 mmol/L (ref 3.5–5.1)
SODIUM: 137 mmol/L (ref 135–145)

## 2016-08-06 LAB — URINALYSIS, ROUTINE W REFLEX MICROSCOPIC
BILIRUBIN URINE: NEGATIVE
GLUCOSE, UA: NEGATIVE mg/dL
HGB URINE DIPSTICK: NEGATIVE
KETONES UR: NEGATIVE mg/dL
Leukocytes, UA: NEGATIVE
Nitrite: NEGATIVE
PROTEIN: NEGATIVE mg/dL
Specific Gravity, Urine: 1.014 (ref 1.005–1.030)
pH: 5.5 (ref 5.0–8.0)

## 2016-08-06 MED ORDER — LIDOCAINE HCL 2 % EX GEL
1.0000 "application " | Freq: Four times a day (QID) | CUTANEOUS | Status: DC | PRN
  Filled 2016-08-06: qty 5

## 2016-08-06 MED ORDER — METHOCARBAMOL 1000 MG/10ML IJ SOLN
1000.0000 mg | Freq: Four times a day (QID) | INTRAVENOUS | Status: DC | PRN
  Filled 2016-08-06: qty 10

## 2016-08-06 MED ORDER — FENTANYL CITRATE (PF) 100 MCG/2ML IJ SOLN: 12.5000 ug | INTRAMUSCULAR | Status: DC | PRN

## 2016-08-06 MED ORDER — METHOCARBAMOL 500 MG PO TABS: 500.0000 mg | ORAL_TABLET | Freq: Four times a day (QID) | ORAL | Status: DC | PRN

## 2016-08-06 MED ORDER — ACETAMINOPHEN 325 MG PO TABS: 975.0000 mg | ORAL_TABLET | Freq: Three times a day (TID) | ORAL | Status: DC

## 2016-08-06 MED ORDER — IPRATROPIUM-ALBUTEROL 0.5-2.5 (3) MG/3ML IN SOLN: 3.0000 mL | RESPIRATORY_TRACT | Status: DC | PRN

## 2016-08-06 MED ORDER — OXYCODONE HCL 5 MG PO TABS: 5.0000 mg | ORAL_TABLET | Freq: Four times a day (QID) | ORAL | Status: DC | PRN

## 2016-08-06 MED ORDER — ACETAMINOPHEN 500 MG PO TABS
1000.0000 mg | ORAL_TABLET | Freq: Three times a day (TID) | ORAL | Status: DC
  Administered 2016-08-06 – 2016-08-08 (×6): 1000 mg via ORAL
  Filled 2016-08-06 (×5): qty 2

## 2016-08-06 MED ORDER — TRAMADOL HCL 50 MG PO TABS
50.0000 mg | ORAL_TABLET | Freq: Four times a day (QID) | ORAL | Status: DC
  Administered 2016-08-06: 50 mg via ORAL
  Filled 2016-08-06: qty 1

## 2016-08-06 MED ORDER — TRAMADOL HCL 50 MG PO TABS: 50.0000 mg | ORAL_TABLET | Freq: Four times a day (QID) | ORAL | Status: DC

## 2016-08-06 NOTE — Consult Note (Signed)
    [facility] Primary Care Navigator  08/06/2016  LAMEKA DYNES 1941-04-12 [id number]  Patient was seen at the bedside with husband Yvone Neu) to identify discharge needs.  Patient was moaning on and off, seemed oversedated and confused at times.  Husband shared that patient's complaint of increased pain to right hip limiting her movements and activity as well as failed non-surgical interventions had led to this admission/ surgery.  Per MD, plan for discharge is home with home health services once patient is more alert and cleared by PT.   Patient's husband confirms that primary care provider is Dr. Lavone Orn with [facility] at [facility]. Husband mentioned that patient is able to drive prior to admission/ surgery. Transportation to her doctors' appointments after discharge will be provided by husband as stated.   Patient's husband Mudlogger at [place]  to obtain medications without any problem. Husband shared that patient is independent with medication management at home using "pill box" system. Husband will be the primary caregiver at home as stated.  Patient's husband expressed understanding to call primary care provider's office once she is discharged, for a post discharge follow-up appointment within a week or sooner if needs arise. Patient letter provided as a reminder.  No other needs or concerns voiced at this time.  For questions, please contact:  Dannielle Huh, BSN, RN- [facility] Primary Care Navigator  Telephone: [phone] [place]

## 2016-08-06 NOTE — Progress Notes (Signed)
Attempted to see pt this am and later in morning as well. Pt with too much pain medicine, per PA, and is lethargic and not following commands.  Son and husband had many questions about bathroom equipment and what OT is all about.  Explained the role of OT to both.  Also showed both family members a 3:1 commode that pt will need for over the toilet and will use in shower as well.  No shower chair necessary. Son stated pt will also need a walker.  Family wanted to know the progression of adls with a hip replacement.  Spoke to them at length about what to expect and how she will bathe/dress etc.  Will attempt again as pt is able.  Jinger Neighbors, [place] [phone]

## 2016-08-06 NOTE — Progress Notes (Signed)
[place]iana seems a little less confused but still cloudy. Oriented times three.  Nonfocal on exam and moves arms and legs well.  Symmetric smile.  O2 sat in 90s on nasal canula.  Unfortunately did get some tramadol this morning.  Expect this is all clearing narcotic but will get medical consult in this retired nurse just to get another opinion which will hopefully be confirmatory.  Discussed with Chrissie Noa and her son.  Will check on her again later today.

## 2016-08-06 NOTE — Progress Notes (Signed)
PT Cancellation Note  Patient Details Name: ALBIRDA PAULY MRN: [id number] DOB: 1941/02/14   Cancelled Treatment:    Reason Eval/Treat Not Completed: Medical issues which prohibited therapy.  Spoke with RN who indicates pt remains lethargic and is awaiting Medical Consult.  Will hold PT today and f/u tomorrow.     Brentyn Seehafer, Thornton Papas 08/06/2016, 1:44 PM

## 2016-08-06 NOTE — Progress Notes (Addendum)
Tiffany Caldwell seems a little less confused but is still quite sleepy. She is easily arouseable and is more alert than this morning. They will continue to avoid any narcotics and muscle relaxers through the night. Hopefully getting some more rest and continuing to flush her system with IV fluids will help too. We greatly appreciate the medical consult and their input in her care. We will also keep an eye out for her UA and chest x-ray results.

## 2016-08-06 NOTE — Consult Note (Signed)
Medical Consultation   Tiffany Caldwell  [id number]  DOB: February 02, 1941  DOA: 08/05/2016  PCP: Irven Shelling, MD   Outpatient Specialists: orthopedics, cardiology, cardiothoracic surgery, pulmonology, infectious disease, gastroenterology    Requesting physician: Dr Rhona Raider - Orthopedic surgery  Reason for consultation: Mental status changes.    History of Present Illness: Tiffany Caldwell is an 75 y.o. female with a past mental history of left breast cancer, GERD, HLD, HTN, IBS and hip pain admitted on 10/31 for elective hip replacement. Patient was in her normal state of health prior to admission. No reported Issues with confusion, somnolence or difficulty breathing. Level V caveat applies to this history as patient only able to provide limited history. History provided predominantly by patient's primary team here at the hospital, orthopedics, family members including son and husband, and nursing staff and notes. Patient will was doing well postoperatively and was able to participate in physical therapy. However, later in the evening on 08/05/2016 patient became very somnolent, minimally responsive and hypoxic with an O2 saturation of 83%. Patient was given Narcan with significant improvement in mental status and respiratory status. In the morning on 08/06/2016 after evaluation by primary team consultation was placed due to persistent somnolence despite discontinuation of Norco and Dilaudid. Tramadol is the only remaining narcotic for patient's pain. Approximately 45 minutes prior to my arrival I asked medical staff to administer an additional dose of Narcan. Per nursing staff and family, patient is significantly more alert and awake since receiving Narcan.   Review of Systems:  ROS As per HPI otherwise 10 point review of systems negative.    Past Medical History: Past Medical History:  Diagnosis Date  . Allergy   . Arthritis   . Breast cancer ([place])    left  breast  . Dyspnea   . GERD (gastroesophageal reflux disease)   . Hyperlipidemia   . Hypertension   . Irritable bowel syndrome (IBS)   . Lung disorder    [place]  . Pinched nerve    back and left leg    Past Surgical History: Past Surgical History:  Procedure Laterality Date  . ABDOMINAL HYSTERECTOMY    . BREAST FIBROADENOMA SURGERY  01/13/1980  . BREAST SURGERY  02/01/2004   tram/mastectomyfor recurrence  . COLONOSCOPY    . CORONARY ARTERY BYPASS GRAFT  2001  . KNEE ARTHROSCOPY  January 2011   right  . MASTECTOMY PARTIAL / LUMPECTOMY W/ AXILLARY LYMPHADENECTOMY  04/10/1989   Dr Annamaria Boots / left breast  . PARTIAL HYSTERECTOMY     vaginal  . TONSILLECTOMY AND ADENOIDECTOMY    . TOTAL HIP ARTHROPLASTY Right 08/05/2016   Procedure: TOTAL HIP ARTHROPLASTY ANTERIOR APPROACH;  Surgeon: [name], MD;  Location: [facility];  Service: Orthopedics;  Laterality: Right;  . UPPER GASTROINTESTINAL ENDOSCOPY       Allergies:   Allergies  Allergen Reactions  . Ciprofloxacin Nausea Only  . Levofloxacin Nausea Only  . Lisinopril Other (See Comments)    Fatigue   . Vioxx [Rofecoxib] Other (See Comments)    UNSPECIFIED REACTION   . Azithromycin Rash     Social History:  reports that she quit smoking about 27 years ago. Her smoking use included Cigarettes. She has never used smokeless tobacco. She reports that she drinks about 0.6 oz of alcohol per week . She reports that she does not use drugs.   Family History: Family History  Problem  Relation Age of Onset  . Emphysema Father     deceased  . Heart disease Mother     deceased  . Colon cancer Paternal Grandmother   . Colon cancer Son   . Colon cancer Paternal Uncle      Physical Exam: Vitals:   08/05/16 2200 08/06/16 0019 08/06/16 0443 08/06/16 0449  BP:  124/62 (!) 110/51   Pulse:  99 93   Resp:  14 16   Temp:  98.7 F (37.1 C) 98.3 F (36.8 C)   TempSrc:  Oral Oral   SpO2: 93% 92% (!) 87% 93%  Weight:         General:  Appears calm and comfortable Eyes:  PERRL, EOMI, normal lids, iris ENT:  grossly normal hearing, lips & tongue, mmm Neck:  no LAD, masses or thyromegaly Cardiovascular:  RRR, no m/r/g. 1+ LE edema.  Respiratory: Slight increased effort, on supplement oxygen by nasal cannula, image breath sounds in bases with few scattered wheezes Abdomen:  soft, ntnd, NABS Skin:  no rash or induration seen on limited exam Musculoskeletal:  grossly normal tone BUE, good ROMUE, RLE bandaged and minimal movement secondary to pain Psychiatric:  grossly normal mood and affect, speech fluent and appropriate, AOx3 Neurologic:  CN 2-12 grossly intact, moves all extremities in coordinated fashion, sensation intact  Data reviewed:  I have personally reviewed following labs and imaging studies Labs:  CBC:  Recent Labs Lab 08/06/16 0953  WBC 5.9  HGB 10.0*  HCT 31.2*  MCV 99.4  PLT 125*    Basic Metabolic Panel:  Recent Labs Lab 08/06/16 0953  NA 137  K 4.0  CL 103  CO2 30  GLUCOSE 127*  BUN 9  CREATININE 0.77  CALCIUM 8.8*   GFR Estimated Creatinine Clearance: 56 mL/min (by C-G formula based on SCr of 0.77 mg/dL). Liver Function Tests: No results for input(s): AST, ALT, ALKPHOS, BILITOT, PROT, ALBUMIN in the last 168 hours. No results for input(s): LIPASE, AMYLASE in the last 168 hours. No results for input(s): AMMONIA in the last 168 hours. Coagulation profile No results for input(s): INR, PROTIME in the last 168 hours.  Cardiac Enzymes: No results for input(s): CKTOTAL, CKMB, CKMBINDEX, TROPONINI in the last 168 hours. BNP: Invalid input(s): POCBNP CBG: No results for input(s): GLUCAP in the last 168 hours. D-Dimer No results for input(s): DDIMER in the last 72 hours. Hgb A1c No results for input(s): HGBA1C in the last 72 hours. Lipid Profile No results for input(s): CHOL, HDL, LDLCALC, TRIG, CHOLHDL, LDLDIRECT in the last 72 hours. Thyroid function studies No  results for input(s): TSH, T4TOTAL, T3FREE, THYROIDAB in the last 72 hours.  Invalid input(s): FREET3 Anemia work up No results for input(s): VITAMINB12, FOLATE, FERRITIN, TIBC, IRON, RETICCTPCT in the last 72 hours. Urinalysis    Component Value Date/Time   COLORURINE YELLOW 07/28/2016 1050   APPEARANCEUR CLEAR 07/28/2016 1050   LABSPEC 1.028 07/28/2016 1050   PHURINE 5.5 07/28/2016 1050   GLUCOSEU NEGATIVE 07/28/2016 1050   HGBUR NEGATIVE 07/28/2016 [place] 07/28/2016 [place] 07/28/2016 1050   PROTEINUR NEGATIVE 07/28/2016 1050   NITRITE NEGATIVE 07/28/2016 [place] 07/28/2016 1050     Microbiology Recent Results (from the past 240 hour(s))  Surgical pcr screen     Status: None   Collection Time: 07/28/16 10:50 AM  Result Value Ref Range Status   MRSA, PCR NEGATIVE NEGATIVE Final   Staphylococcus aureus NEGATIVE  NEGATIVE Final    Comment:        The Xpert SA Assay (FDA approved for NASAL specimens in patients over 73 years of age), is one component of a comprehensive surveillance program.  Test performance has been validated by [facility] for patients greater than or equal to 45 year old. It is not intended to diagnose infection nor to guide or monitor treatment.        Inpatient Medications:   Scheduled Meds: . aspirin EC  325 mg Oral BID PC  . docusate sodium  100 mg Oral BID  . gabapentin  300 mg Oral TID  . irbesartan  75 mg Oral Daily   And  . hydrochlorothiazide  12.5 mg Oral Daily  . loratadine  10 mg Oral Daily  . simvastatin  20 mg Oral QHS  . traMADol  50 mg Oral Q6H   Continuous Infusions: . lactated ringers 75 mL/hr at 08/06/16 [id number]     Radiological Exams on Admission: Dg C-arm 1-60 Min  Result Date: 08/05/2016 CLINICAL DATA:  Right hip replacement. EXAM: OPERATIVE right HIP (WITH PELVIS IF PERFORMED) 2 VIEWS TECHNIQUE: Fluoroscopic spot image(s) were submitted for  interpretation post-operatively. COMPARISON:  No recent prior. FINDINGS: Total right hip replacement. Hardware intact. Good anatomic alignment. IMPRESSION: Total right hip replacement with good anatomic alignment. Electronically Signed   By: Marcello Moores  Register   On: 08/05/2016 09:23   Dg Hip Operative Unilat With Pelvis Right  Result Date: 08/05/2016 CLINICAL DATA:  Right hip replacement. EXAM: OPERATIVE right HIP (WITH PELVIS IF PERFORMED) 2 VIEWS TECHNIQUE: Fluoroscopic spot image(s) were submitted for interpretation post-operatively. COMPARISON:  No recent prior. FINDINGS: Total right hip replacement. Hardware intact. Good anatomic alignment. IMPRESSION: Total right hip replacement with good anatomic alignment. Electronically Signed   By: Marcello Moores  Register   On: 08/05/2016 09:23    Impression/Recommendations Principal Problem:   Primary localized osteoarthritis of right hip Active Problems:   Primary osteoarthritis of right hip  R hip replacement: POD#1.  - Management per primary team - Dr. Rhona Raider  Somnolence: likely secondary to opioid side effects. Pt opioid naive. Received interopperative narcotics and then multiple dilaudid doses over the course of the day w/ final dose 1.5hrs prior to rapid response being called for respiratory depression and somnolence. Conditions improves w/ narcan administration. Doubt infectious process but cannot r/o at this time - workup pending. Received Vanc and Ancef perioperatively. No evidence of metabolic etiology. No focal deficit to suspect CNS injury.  - Narcan PRN - Modification of narcotic regimen as outlined below - O2 PRN - UA, CXR  Fever: suspect post op related. Doubt infectious but will monitor as above - tylenol for fever  Pain mgt: opioid naive pt w/ significant recent opioid administration - Tylenol 1g scheduled TID - Increase Robaxin - Tramadol PRN - for severe pain pt allowed to have low dose oxycodone and fentanyl. If becomes somnolent  again these will have to be DC permanently  Hypoxemia: 83% and somnolence likely from respiratory/CNS depression from narcotics. H/o Emphysema. Improved w/ O2 and narcan. On exam pt w/ crackles and few wheezes w/ diminished sounds in bases. Pt fluid + since admission.  - CXR - Duonebs - Narcan PRN - O2 prn   Thank you for this consultation.  Our [facility] hospitalist team will follow the patient with you.  Tynika Luddy J M.D. [facility] 08/06/2016, 1:02 PM

## 2016-08-06 NOTE — Progress Notes (Signed)
RN called for increase lethargy and O2 sats 86% on 2 L approx 45-50 mins receiving Dilaudid 1 mg IVP. Pt on a continuous pulse ox. Skin warm and dry, arousal with a shoulder rub, answer all questions appropriately, would quickly go back to sleep. Pt sleeping and mouth breathing. Pt instructed to take in deep breaths increasing her sats. Narcan 0.2 mg IVP given. Pt became more alert, O2 sats increased 93-95% on 2 L. Stayed with patient for approx 20 mins, advised Larkin Ina RN to call with any new or worseing changes.

## 2016-08-06 NOTE — Progress Notes (Signed)
Subjective: 1 Day Post-Op Procedure(s) (LRB): TOTAL HIP ARTHROPLASTY ANTERIOR APPROACH (Right)  Patient appears over sedated this mornign and is confused. She is able to state her name and the year after gettign her thoughts together. She was given dilaudid last night and had to be administered Narcan as she was over sedated and her sats were low.  Activity level:  wbat Diet tolerance:  ok Voiding:  ok Patient reports pain as mild.    Objective: Vital signs in last 24 hours: Temp:  [97 F (36.1 C)-98.7 F (37.1 C)] 98.3 F (36.8 C) (11/01 0443) Pulse Rate:  [56-99] 93 (11/01 0443) Resp:  [9-27] 16 (11/01 0443) BP: (82-134)/(49-62) 110/51 (11/01 0443) SpO2:  [87 %-100 %] 93 % (11/01 0449) FiO2 (%):  [4 %] 4 % (10/31 2200)  Labs: No results for input(s): HGB in the last 72 hours. No results for input(s): WBC, RBC, HCT, PLT in the last 72 hours. No results for input(s): NA, K, CL, CO2, BUN, CREATININE, GLUCOSE, CALCIUM in the last 72 hours. No results for input(s): LABPT, INR in the last 72 hours.  Physical Exam:  Neurologically intact ABD soft Neurovascular intact Sensation intact distally Intact pulses distally Dorsiflexion/Plantar flexion intact Incision: dressing C/D/I and no drainage No cellulitis present Compartment soft  Assessment/Plan:  1 Day Post-Op Procedure(s) (LRB): TOTAL HIP ARTHROPLASTY ANTERIOR APPROACH (Right) Advance diet Up with therapy Discharge home with home health once more alert and cleared by PT. We will order CBC and BMP. We will also give a bolus of 500cc to help flush the drugs out of her system. We have D/C all narcotics and switched to tramadol only She has a cough but her lungs sounded clear with her tryign to take a deep breath. We will continue to follow colsely throughout the day. Continue on ASA 325mg  BID x 4 weeks post op. Follow up in office 2 weeks post op.   Tiffany Caldwell, Larwance Sachs 08/06/2016, 8:06 AM

## 2016-08-06 NOTE — Progress Notes (Signed)
PT Cancellation Note  Patient Details Name: CHERYLYN TEED MRN: [id number] DOB: 1941/08/13   Cancelled Treatment:    Reason Eval/Treat Not Completed: Medical issues which prohibited therapy. Per PA progress note, pt received dilaudid last night and appears oversedated this AM. She is confused and lethargic. Spouse prefers holding PT Rx until she clears. PT to follow up this PM.   Lorriane Shire 08/06/2016, 10:54 AM

## 2016-08-07 ENCOUNTER — Encounter (HOSPITAL_COMMUNITY): Payer: Self-pay | Admitting: General Practice

## 2016-08-07 DIAGNOSIS — R5082 Postprocedural fever: Secondary | ICD-10-CM

## 2016-08-07 DIAGNOSIS — R0902 Hypoxemia: Secondary | ICD-10-CM | POA: Diagnosis not present

## 2016-08-07 DIAGNOSIS — G9340 Encephalopathy, unspecified: Secondary | ICD-10-CM | POA: Diagnosis not present

## 2016-08-07 DIAGNOSIS — D62 Acute posthemorrhagic anemia: Secondary | ICD-10-CM | POA: Diagnosis not present

## 2016-08-07 NOTE — Care Management Note (Signed)
Case Management Note  Patient Details  Name: Tiffany Caldwell MRN: [id number] Date of Birth: 1941-09-25  Subjective/Objective:  75 yr old female s/p left total hip arthroplasty.                  Action/Plan: Case manager spoke with patient concerning [place] and DME needs at discharge. Patient was preoperatively setup with [facility], no changes. Rolling walker and 3in1 will be delivered to patient's room prior to discharge. She will have family support at discharge.   Expected Discharge Date:   08/08/16               Expected Discharge Plan:  [place]  In-House Referral:     Discharge planning Services  CM Consult  Post Acute Care Choice:  Home Health Choice offered to:  Patient  DME Arranged:  3-N-1, Walker rolling DME Agency:  [employer]  HH Arranged:  PT [place]:  [facility] (now [facility])  Status of Service:  Completed, signed off  If discussed at [employer], dates discussed:    Additional Comments:  Ninfa Meeker, RN 08/07/2016, 2:56 PM

## 2016-08-07 NOTE — Evaluation (Signed)
Occupational Therapy Evaluation Patient Details Name: Tiffany Caldwell MRN: [id number] DOB: 06-22-1941 Today's Date: 08/07/2016    History of Present Illness Patient is a 75 y/o female with hx of HTN, HLD, breast ca presents s/p right THA, direct anterior approach.   Clinical Impression   PTA Pt independent in ADL/IADL and mobility. Pt currently supervision for seated ADL and able to stand at sink for approx 7 min to perform grooming during today's session, but limited in mobility. Pt will benefit from skilled OT in the acute care setting to maximize indpendence in self care tasks before d/c home with supervision from husband. Next session to focus on transfers and building up balance for ADL.    Follow Up Recommendations  No OT follow up;Supervision/Assistance - 24 hour    Equipment Recommendations  3 in 1 bedside comode    Recommendations for Other Services       Precautions / Restrictions Precautions Precautions: Fall Precaution Booklet Issued: No Precaution Comments: Direct anterior approach Restrictions Weight Bearing Restrictions: Yes RLE Weight Bearing: Weight bearing as tolerated      Mobility Bed Mobility Overal bed mobility: Needs Assistance Bed Mobility: Supine to Sit     Supine to sit: Mod assist;HOB elevated     General bed mobility comments: Pt sitting OOB in chair upon arrival  Transfers Overall transfer level: Needs assistance Equipment used: Rolling walker (2 wheeled) Transfers: Sit to/from [facility] Sit to Stand: Min assist Stand pivot transfers: Min assist       General transfer comment: Pt benefitted from sequencing cues and safe hand placement    Balance Overall balance assessment: Needs assistance Sitting-balance support: No upper extremity supported;Feet supported Sitting balance-Leahy Scale: Good Sitting balance - Comments: in chair   Standing balance support: Single extremity supported;During functional  activity Standing balance-Leahy Scale: Poor Standing balance comment: sink level ADL                            ADL Overall ADL's : Needs assistance/impaired Eating/Feeding: Modified independent;Sitting   Grooming: Wash/dry hands;Wash/dry face;Oral care;Brushing hair;Min guard Grooming Details (indicate cue type and reason): sink level - stood for approx 7 min with no LOB     Lower Body Bathing: Supervison/ safety Lower Body Bathing Details (indicate cue type and reason): able to bring left and right foot up to knee for LB bathing Upper Body Dressing : Modified independent;Sitting   Lower Body Dressing: Min guard;Sit to/from stand Lower Body Dressing Details (indicate cue type and reason): able to bring left and right foot up to knee for LB dressing - able to don/doff socks with increased time Toilet Transfer: Minimal assistance;Stand-pivot Toilet Transfer Details (indicate cue type and reason): simulated with recliner Toileting- Clothing Manipulation and Hygiene: Min guard;Sit to/from stand         General ADL Comments: Pt very motivated to be independent and do things for herself     Vision     Perception     Praxis      Pertinent Vitals/Pain Pain Assessment: Faces Faces Pain Scale: Hurts even more Pain Location: Right Hip Pain Descriptors / Indicators: Operative site guarding;Sore Pain Intervention(s): Limited activity within patient's tolerance;Monitored during session;Ice applied     Hand Dominance Right   Extremity/Trunk Assessment Upper Extremity Assessment Upper Extremity Assessment: Overall WFL for tasks assessed   Lower Extremity Assessment Lower Extremity Assessment: RLE deficits/detail RLE Deficits / Details: Decreased strength and ROM post-op  Cervical / Trunk Assessment Cervical / Trunk Assessment: Kyphotic   Communication Communication Communication: No difficulties   Cognition Arousal/Alertness: Awake/alert;Suspect due to  medications (Still had trouble answering some questions) Behavior During Therapy: WFL for tasks assessed/performed Overall Cognitive Status: Within Functional Limits for tasks assessed       Memory: Decreased short-term memory             General Comments       Exercises       Shoulder Instructions      Home Living Family/patient expects to be discharged to:: Private residence Living Arrangements: Spouse/significant other Available Help at Discharge: Family;Available 24 hours/day Type of Home: House Home Access: Level entry     Home Layout: Two level;Bed/bath upstairs;Able to live on main level with bedroom/bathroom Alternate Level Stairs-Number of Steps: 1 flight Alternate Level Stairs-Rails: Right Bathroom Shower/Tub: Occupational psychologist: Standard Bathroom Accessibility: Yes How Accessible: Accessible via walker Home Equipment: None          Prior Functioning/Environment Level of Independence: Independent        Comments: Cooks, cleans drives.         OT Problem List: Decreased strength;Decreased range of motion;Decreased activity tolerance;Impaired balance (sitting and/or standing);Decreased safety awareness;Decreased knowledge of use of DME or AE;Pain   OT Treatment/Interventions: Self-care/ADL training;DME and/or AE instruction;Therapeutic activities;Patient/family education;Balance training    OT Goals(Current goals can be found in the care plan section) Acute Rehab OT Goals Patient Stated Goal: Go to grandchildren's sporting events OT Goal Formulation: With patient/family Time For Goal Achievement: 08/14/16 Potential to Achieve Goals: Good ADL Goals Pt Will Perform Grooming: with modified independence;standing Pt Will Transfer to Toilet: with modified independence;ambulating;regular height toilet Pt Will Perform Toileting - Clothing Manipulation and hygiene: with modified independence;sit to/from stand  OT Frequency: Min 2X/week    Barriers to D/C:            Co-evaluation              End of Session Equipment Utilized During Treatment: Gait belt;Oxygen Nurse Communication: Mobility status  Activity Tolerance: Patient tolerated treatment well Patient left: in chair;with call bell/phone within reach;with family/visitor present   Time: [id number] OT Time Calculation (min): 37 min Charges:  OT General Charges $OT Visit: 1 Procedure OT Evaluation $OT Eval Low Complexity: 1 Procedure OT Treatments $Self Care/Home Management : 8-22 mins G-Codes:    Merri Ray Tyvion Edmondson August 17, 2016, 1:40 PM  Hulda Humphrey OTR/L [phone]

## 2016-08-07 NOTE — Progress Notes (Signed)
Subjective: 2 Days Post-Op Procedure(s) (LRB): TOTAL HIP ARTHROPLASTY ANTERIOR APPROACH (Right)   Patient is much more alert and is feeling like her self but still a little sleepy.   Activity level:  wbat Diet tolerance:  ok Voiding:  ok Patient reports pain as mild.    Objective: Vital signs in last 24 hours: Temp:  [98.5 F (36.9 C)-101.3 F (38.5 C)] 98.5 F (36.9 C) (11/02 0340) Pulse Rate:  [78-87] 78 (11/02 0340) Resp:  [16-18] 17 (11/02 0340) BP: (115-152)/(48-56) 124/54 (11/02 0340) SpO2:  [91 %-98 %] 98 % (11/02 0954) Weight:  [69.6 kg (153 lb 8 oz)-71.8 kg (158 lb 6.4 oz)] 71.8 kg (158 lb 6.4 oz) (11/02 0340)  Labs:  Recent Labs  08/06/16 0953  HGB 10.0*    Recent Labs  08/06/16 0953  WBC 5.9  RBC 3.14*  HCT 31.2*  PLT 125*    Recent Labs  08/06/16 0953  NA 137  K 4.0  CL 103  CO2 30  BUN 9  CREATININE 0.77  GLUCOSE 127*  CALCIUM 8.8*   No results for input(s): LABPT, INR in the last 72 hours.  Physical Exam:  Neurologically intact ABD soft Neurovascular intact Sensation intact distally Intact pulses distally Dorsiflexion/Plantar flexion intact Incision: dressing C/D/I and no drainage No cellulitis present Compartment soft  Assessment/Plan:  2 Days Post-Op Procedure(s) (LRB): TOTAL HIP ARTHROPLASTY ANTERIOR APPROACH (Right) Advance diet Up with therapy Plan for discharge tomorrow Discharge home with home health if continuing to do well and cleared by PT. Continue on ASA 325mg  BID x 4 weeks post op for DVT prevention. Tylenol for pain. Follow up in office 2 weeks post op.   Tallie Dodds, Larwance Sachs 08/07/2016, 2:16 PM

## 2016-08-07 NOTE — Progress Notes (Signed)
Pt continues to be lethargic; arousable, a&o x4, but can be confused at times. VS checked, rectal temp acquired, found to be 101.7 @ 2100. Pt given scheduled tylenol and thoroughly encouraged to do IS. Pts IS capabilities at 750, encouraged to get it higher. Pts rectal temp now 99.9. Nursing will continue to monitor.

## 2016-08-07 NOTE — Progress Notes (Addendum)
PROGRESS NOTE  Tiffany Caldwell  [id number] DOB: [birth year]  DOA: 08/05/2016 PCP: Irven Shelling, MD   Brief Narrative:  75 year old female with a PMH of left breast cancer, GERD, HLD, HTN, IBS, status post right total hip arthroplasty under spinal anesthesia on 08/05/16 (for degenerative joint disease), [facility] was consulted on 08/06/16 due to altered mental status (somnolent, less responsive) and hypoxia to 83%.   Assessment & Plan:   Principal Problem:   Primary localized osteoarthritis of right hip Active Problems:   Primary osteoarthritis of right hip   Bibasilar crackles   Fever of unknown origin   Hypoxemia   Somnolence   1. Acute encephalopathy: Likely related to perioperative sedation and postop opioids. Patient is opioid nave. Based on prior notes, improved after doses of Narcan. Infectious etiology felt less likely despite fever which was attributed to recent surgical intervention/manipulation. Clinically improved. Avoid or minimize opioids and psychotropic medications as much as possible. Metabolic meters unremarkable. No focal deficits on exam. 2. Postop fever: Doubt infectious etiology. Likely due to recent surgical intervention. No antimicrobials for now. However if patient persists with high fever then may consider further evaluation and treatment. When necessary Tylenol. Seems to be defervescing. 3. Acute respiratory failure with hypoxia: Likely due to sedation from opioids. Chest x-ray without acute findings. Incentive spirometry. Wean off oxygen as tolerated as long as his saturations >92%. 4. Status post right total hip arthroplasty 08/05/16: Management per primary service. 5. Essential hypertension: Controlled. Continue ARB and HCTZ. Follow BMP. 6. Acute blood loss anemia: Hemoglobin dropped from 13.2 on 10/23 to 10 on 11/1. Follow CBC in a.m. Transfuse if hemoglobin drops to 7 g per DL or less. 7. Mild thrombocytopenia. Likely secondary to acute blood loss  versus chronic intermittent. Follow CBC in a.m. 8. Hyperlipidemia: Statins. 9. Abnormal chest x-ray: Chest x-ray 08/06/16 comments about right suprahilar density and biapical pleural scarring. CT chest 12/18/15: Multiple abnormalities >cavitate 3 lung mass in the superior segment right lower lobe, nodular peribronchovesicular interstitial thickening extending from the mass to the right hilum >differentials include malignancy or atypical infection. Status post CABG, moderate emphysema and left mastectomy. No respiratory symptoms. Outpatient follow-up with PCP/[facility]/pulmonology. As per thoracic surgery follow-up 10/3: Strong suspicion this is a MAI infection but cancer cannot be ruled out. Outpatient workup pending.   DVT prophylaxis: Aspirin 325 twice a day per primary service. Code Status: Full Family Communication: None at bedside Disposition Plan: Per primary service   Consultants:   [facility] are consultants   Procedures:   Right total hip arthroplasty 08/05/16   Antimicrobials:   None   Subjective: Seen this morning. Denied complaints. Alert and oriented to person, place. Slightly confused.  Objective:  Vitals:   08/06/16 2259 08/07/16 0021 08/07/16 0340 08/07/16 0954  BP:  (!) 115/56 (!) 124/54   Pulse:  79 78   Resp:  17 17   Temp: (!) 100.7 F (38.2 C) 99.9 F (37.7 C) 98.5 F (36.9 C)   TempSrc: Rectal Rectal Oral   SpO2:  98% 97% 98%  Weight:   71.8 kg (158 lb 6.4 oz)     Intake/Output Summary (Last 24 hours) at 08/07/16 1645 Last data filed at 08/07/16 1300  Gross per 24 hour  Intake              480 ml  Output                0 ml  Net  480 ml   Filed Weights   08/05/16 [id number] 08/06/16 2138 08/07/16 0340  Weight: 66.7 kg (147 lb) 69.6 kg (153 lb 8 oz) 71.8 kg (158 lb 6.4 oz)    Examination:  General exam: Pleasant elderly female sitting up comfortably in bed eating breakfast this morning. Respiratory system: slightly diminished breath sounds in the  bases but otherwise clear to auscultation. Respiratory effort normal. Cardiovascular system: S1 & S2 heard, RRR. No JVD, murmurs, rubs, gallops or clicks. No pedal edema. Gastrointestinal system: Abdomen is nondistended, soft and nontender. No organomegaly or masses felt. Normal bowel sounds heard. Central nervous system: Alert and oriented to self and place only . No focal neurological deficits. Extremities: Symmetric 5 x 5 power. Skin: No rashes, lesions or ulcers Psychiatry: Judgement and insight impaired. Mood & affect appropriate.     Data Reviewed: I have personally reviewed following labs and imaging studies  CBC:  Recent Labs Lab 08/06/16 0953  WBC 5.9  HGB 10.0*  HCT 31.2*  MCV 99.4  PLT 0000000*   Basic Metabolic Panel:  Recent Labs Lab 08/06/16 0953  NA 137  K 4.0  CL 103  CO2 30  GLUCOSE 127*  BUN 9  CREATININE 0.77  CALCIUM 8.8*   GFR: Estimated Creatinine Clearance: 60.7 mL/min (by C-G formula based on SCr of 0.77 mg/dL). Liver Function Tests: No results for input(s): AST, ALT, ALKPHOS, BILITOT, PROT, ALBUMIN in the last 168 hours. No results for input(s): LIPASE, AMYLASE in the last 168 hours. No results for input(s): AMMONIA in the last 168 hours. Coagulation Profile: No results for input(s): INR, PROTIME in the last 168 hours. Cardiac Enzymes: No results for input(s): CKTOTAL, CKMB, CKMBINDEX, TROPONINI in the last 168 hours. BNP (last 3 results) No results for input(s): PROBNP in the last 8760 hours. HbA1C: No results for input(s): HGBA1C in the last 72 hours. CBG: No results for input(s): GLUCAP in the last 168 hours. Lipid Profile: No results for input(s): CHOL, HDL, LDLCALC, TRIG, CHOLHDL, LDLDIRECT in the last 72 hours. Thyroid Function Tests: No results for input(s): TSH, T4TOTAL, FREET4, T3FREE, THYROIDAB in the last 72 hours. Anemia Panel: No results for input(s): VITAMINB12, FOLATE, FERRITIN, TIBC, IRON, RETICCTPCT in the last 72  hours.  Sepsis Labs: No results for input(s): PROCALCITON, LATICACIDVEN in the last 168 hours.  No results found for this or any previous visit (from the past 240 hour(s)).       Radiology Studies: Dg Chest Port 1 View  Result Date: 08/06/2016 CLINICAL DATA:  Weakness.  Bibasilar crackles. EXAM: PORTABLE CHEST 1 VIEW COMPARISON:  Chest radiograph 07/28/2016 FINDINGS: Suprahilar density in the right lung is unchanged. Biapical scarring is again seen. There is mild blunting of both costophrenic angles. No focal consolidation. The degree of inflation is decreased relative to the prior study. No pneumothorax. No overt pulmonary edema. CABG markers, stent, median sternotomy wires and left axillary surgical clips are again noted. IMPRESSION: 1. Shallow lung inflation. Blunting of the costophrenic angles may indicate small pleural effusions. 2. Unchanged right suprahilar density and biapical pleural scarring. 3. No overt pulmonary edema. Electronically Signed   By: Ulyses Jarred M.D.   On: 08/06/2016 19:13        Scheduled Meds: . acetaminophen  1,000 mg Oral TID  . aspirin EC  325 mg Oral BID PC  . docusate sodium  100 mg Oral BID  . gabapentin  300 mg Oral TID  . irbesartan  75 mg Oral Daily  And  . hydrochlorothiazide  12.5 mg Oral Daily  . loratadine  10 mg Oral Daily  . simvastatin  20 mg Oral QHS   Continuous Infusions: . lactated ringers 75 mL/hr at 08/06/16 0339     LOS: 2 days     [facility], MD [facility] Pager [phone]  If 7PM-7AM, please contact night-coverage [email or web address] Password [email or web address] 08/07/2016, 4:45 PM

## 2016-08-07 NOTE — Progress Notes (Signed)
Physical Therapy Treatment Patient Details Name: Tiffany Caldwell MRN: [id number] DOB: Mar 31, 1941 Today's Date: 08/07/2016    History of Present Illness Patient is a 75 y/o female with hx of HTN, HLD, breast ca presents s/p right THA, direct anterior approach.    PT Comments    Pt reports she is still not feeling well today. Pt is not progressing towards goals at this time due to lethargy, pain, and participation level. Pt required a significant amount of time to get OOB and transfer to the chair. Pt had poor initiation, decreased processing and required max verbal cues for sequencing and completing the task. Pt's husband was present during the treatment and reported that he still feels he can take her home and assist her with HHPT services. At this time I would recommend continued acute PT services and holding off d/c until pt is able to tolerate more activity level and demonstrates improved cognition and safety.   Follow Up Recommendations  Home health PT;Supervision/Assistance - 24 hour     Equipment Recommendations  Rolling walker with 5" wheels    Recommendations for Other Services       Precautions / Restrictions Precautions Precautions: Fall;Anterior Hip Restrictions RLE Weight Bearing: Weight bearing as tolerated    Mobility  Bed Mobility Overal bed mobility: Needs Assistance Bed Mobility: Supine to Sit     Supine to sit: Mod assist;HOB elevated     General bed mobility comments: cues for technique and increased time, multiple rest breaks due to pain  Transfers Overall transfer level: Needs assistance Equipment used: Rolling walker (2 wheeled) Transfers: Sit to/from Stand Sit to Stand: Min assist         General transfer comment: Pt verbalized she could not stand, pt presented with decreased processing and problem solving, pt needed multiple cues for task and encouragement to stand.  Ambulation/Gait Ambulation/Gait assistance: Min assist Ambulation  Distance (Feet): 2 Feet Assistive device: Rolling walker (2 wheeled) Gait Pattern/deviations: Step-to pattern;Decreased step length - left;Decreased stance time - right;Decreased weight shift to right;Trunk flexed   Gait velocity interpretation: Below normal speed for age/gender General Gait Details: max cues for sequencing, pt had difficulty with initiation and processing information,    Stairs            Wheelchair Mobility    Modified Rankin (Stroke Patients Only)       Balance Overall balance assessment: Needs assistance Sitting-balance support: No upper extremity supported Sitting balance-Leahy Scale: Fair     Standing balance support: Bilateral upper extremity supported Standing balance-Leahy Scale: Poor                      Cognition Arousal/Alertness: Lethargic Behavior During Therapy: Flat affect Overall Cognitive Status: Difficult to assess                      Exercises Total Joint Exercises Ankle Circles/Pumps: AROM;Strengthening;Both;10 reps;Supine Quad Sets: AROM;Strengthening;Both;10 reps;Supine Short Arc Quad: AROM;Strengthening;Right;10 reps;Supine Heel Slides: AAROM;Strengthening;Right;10 reps;Supine Hip ABduction/ADduction: AAROM;Strengthening;Right;10 reps;Supine    General Comments General comments (skin integrity, edema, etc.): rn came in during session and reported the Ortho MD wants the pt up in the chair today. Pt verbalized she did not feel well and did not want to get up. Pt reluctantly agreed, but required max cues and a significant amount of time to complete task.      Pertinent Vitals/Pain Pain Assessment: Faces Faces Pain Scale: Hurts whole lot Pain Location: Right hip Pain  Descriptors / Indicators: Discomfort Pain Intervention(s): Limited activity within patient's tolerance;Monitored during session;Premedicated before session;Ice applied    Home Living                      Prior Function             PT Goals (current goals can now be found in the care plan section) Progress towards PT goals: Not progressing toward goals - comment (lethargic, not feeling well, pain)    Frequency    7X/week      PT Plan Current plan remains appropriate (Pt's spouse reports he can assist and he was present for treatment)    Co-evaluation             End of Session Equipment Utilized During Treatment: Gait belt;Oxygen Activity Tolerance: Patient limited by fatigue;No increased pain Patient left: in chair;with call bell/phone within reach;with family/visitor present     Time: 0925-1003 PT Time Calculation (min) (ACUTE ONLY): 38 min  Charges:  $Gait Training: 8-22 mins $Therapeutic Exercise: 8-22 mins $Therapeutic Activity: 23-37 mins                    G Codes:      Lelon Mast 08/07/2016, 10:04 AM

## 2016-08-08 DIAGNOSIS — M1611 Unilateral primary osteoarthritis, right hip: Secondary | ICD-10-CM | POA: Diagnosis not present

## 2016-08-08 DIAGNOSIS — R0989 Other specified symptoms and signs involving the circulatory and respiratory systems: Secondary | ICD-10-CM

## 2016-08-08 LAB — BASIC METABOLIC PANEL
ANION GAP: 6 (ref 5–15)
BUN: 10 mg/dL (ref 6–20)
CHLORIDE: 103 mmol/L (ref 101–111)
CO2: 29 mmol/L (ref 22–32)
Calcium: 8.6 mg/dL — ABNORMAL LOW (ref 8.9–10.3)
Creatinine, Ser: 0.77 mg/dL (ref 0.44–1.00)
GFR calc non Af Amer: >60 mL/min (ref >60)
Glucose, Bld: 93 mg/dL (ref 65–99)
POTASSIUM: 3.3 mmol/L — AB (ref 3.5–5.1)
SODIUM: 138 mmol/L (ref 135–145)

## 2016-08-08 LAB — CBC
HCT: 27.4 % — ABNORMAL LOW (ref 36.0–46.0)
HEMOGLOBIN: 8.8 g/dL — AB (ref 12.0–15.0)
MCH: 32 pg (ref 26.0–34.0)
MCHC: 32.1 g/dL (ref 30.0–36.0)
MCV: 99.6 fL (ref 78.0–100.0)
Platelets: 114 10*3/uL — ABNORMAL LOW (ref 150–400)
RBC: 2.75 MIL/uL — AB (ref 3.87–5.11)
RDW: 13.1 % (ref 11.5–15.5)
WBC: 5 10*3/uL (ref 4.0–10.5)

## 2016-08-08 MED ORDER — ASPIRIN 325 MG PO TBEC: 325.0000 mg | DELAYED_RELEASE_TABLET | Freq: Two times a day (BID) | ORAL | 0 refills | Status: DC

## 2016-08-08 MED ORDER — POTASSIUM CHLORIDE CRYS ER 20 MEQ PO TBCR
40.0000 meq | EXTENDED_RELEASE_TABLET | Freq: Once | ORAL | Status: DC
  Filled 2016-08-08: qty 2

## 2016-08-08 NOTE — Progress Notes (Signed)
Physical Therapy Treatment Patient Details Name: Tiffany Caldwell MRN: [id number] DOB: 1941/01/18 Today's Date: 08/08/2016    History of Present Illness Patient is a 75 y/o female with hx of HTN, HLD, breast ca presents s/p right THA, direct anterior approach.    PT Comments    Pt sitting up in chair upon therapist arrival. Performed long sitting exercises in order to improve ROM prior to gait. Performed gait with improved cadence and sequencing noted this visit with mild antalgia noted. Pt has made significant improvements since last therapy visit with overall improved tolerance for activities. Pt anticipates DC home today.   Follow Up Recommendations  Home health PT     Equipment Recommendations  Rolling walker with 5" wheels    Recommendations for Other Services       Precautions / Restrictions Precautions Precautions: Fall Precaution Comments: Direct anterior approach Restrictions Weight Bearing Restrictions: Yes RLE Weight Bearing: Weight bearing as tolerated    Mobility  Bed Mobility               General bed mobility comments: Pt sitting OOB in chair upon arrival  Transfers Overall transfer level: Needs assistance Equipment used: Rolling walker (2 wheeled) Transfers: Sit to/from Stand Sit to Stand: Min guard         General transfer comment: min gaurd for safety from recliner chair   Ambulation/Gait Ambulation/Gait assistance: Min guard Ambulation Distance (Feet): 150 Feet Assistive device: Rolling walker (2 wheeled) Gait Pattern/deviations: Step-through pattern;Decreased stance time - left;Antalgic Gait velocity: decreased Gait velocity interpretation: <1.8 ft/sec, indicative of risk for recurrent falls General Gait Details: min guard for safety, improved cadence and step length with increased distance. mIld antaglia noted   Stairs Stairs: Yes Stairs assistance: Min guard Stair Management: No rails Number of Stairs: 1 General stair  comments: Min guard for safety on 1-step with cues for proper sequcning  Wheelchair Mobility    Modified Rankin (Stroke Patients Only)       Balance Overall balance assessment: Needs assistance Sitting-balance support: No upper extremity supported Sitting balance-Leahy Scale: Good Sitting balance - Comments: in chair   Standing balance support: Single extremity supported Standing balance-Leahy Scale: Fair Standing balance comment: requires supervision for safety                    Cognition Arousal/Alertness: Awake/alert;Suspect due to medications Behavior During Therapy: [facility] for tasks assessed/performed Overall Cognitive Status: Within Functional Limits for tasks assessed                      Exercises Total Joint Exercises Ankle Circles/Pumps: AROM;Both;20 reps Short Arc Quad: AROM;Right;10 reps;Supine Heel Slides: AAROM;Right;10 reps;Supine Hip ABduction/ADduction: 10 reps;AAROM;Right;Supine Long Arc Quad: AROM;Right;10 reps;Seated    General Comments        Pertinent Vitals/Pain Pain Assessment: Faces Faces Pain Scale: Hurts little more Pain Location: right hip with weight bearing Pain Descriptors / Indicators: Aching;Sore Pain Intervention(s): Monitored during session;Ice applied    Home Living                      Prior Function            PT Goals (current goals can now be found in the care plan section) Acute Rehab PT Goals Patient Stated Goal: Go to grandchildren's sporting events Progress towards PT goals: Progressing toward goals    Frequency    7X/week      PT Plan Current plan remains  appropriate    Co-evaluation             End of Session Equipment Utilized During Treatment: Gait belt Activity Tolerance: Patient tolerated treatment well;Patient limited by fatigue Patient left: in chair;with call bell/phone within reach;with family/visitor present     Time: 0921-0947 PT Time Calculation (min) (ACUTE  ONLY): 26 min  Charges:  $Gait Training: 8-22 mins $Therapeutic Exercise: 8-22 mins                    G Codes:      Scheryl Marten PT, DPT  [phone]  08/08/2016, 9:59 AM

## 2016-08-08 NOTE — Care Management Important Message (Signed)
Important Message  Patient Details  Name: Tiffany Caldwell MRN: [id number] Date of Birth: 1941/05/31   Medicare Important Message Given:  Yes    Joanthan Hlavacek [address]ine [address] 08/08/2016, 11:25 AM

## 2016-08-08 NOTE — Progress Notes (Signed)
Occupational Therapy Treatment Patient Details Name: Tiffany Caldwell MRN: [id number] DOB: 10/02/1941 Today's Date: 08/08/2016    History of present illness Patient is a 75 y/o female with hx of HTN, HLD, breast ca presents s/p right THA, direct anterior approach.   OT comments  Pt making progress towards OT goals, and is at safe level to discharge home with family support. Pt able to operate at supervision level with min guard for LB dressing during session today. Pt and husband received all education and had no further questions for OT.  Follow Up Recommendations  No OT follow up;Supervision/Assistance - 24 hour    Equipment Recommendations  3 in 1 bedside comode    Recommendations for Other Services      Precautions / Restrictions Precautions Precautions: Fall Precaution Comments: Direct anterior approach Restrictions Weight Bearing Restrictions: Yes RLE Weight Bearing: Weight bearing as tolerated       Mobility Bed Mobility               General bed mobility comments: Pt sitting OOB in chair upon arrival  Transfers Overall transfer level: Needs assistance Equipment used: Rolling walker (2 wheeled) Transfers: Sit to/from Stand Sit to Stand: Supervision              Balance Overall balance assessment: Needs assistance Sitting-balance support: No upper extremity supported;Feet supported Sitting balance-Leahy Scale: Good Sitting balance - Comments: in chair   Standing balance support: No upper extremity supported;During functional activity Standing balance-Leahy Scale: Fair Standing balance comment: supervision for safety                   ADL Overall ADL's : Needs assistance/impaired     Grooming: Wash/dry hands;Oral care;Supervision/safety;Standing Grooming Details (indicate cue type and reason): sink level         Upper Body Dressing : Modified independent;Sitting Upper Body Dressing Details (indicate cue type and reason): bra and  pullover shirt Lower Body Dressing: Set up;Sit to/from stand Lower Body Dressing Details (indicate cue type and reason): underwear, pants Toilet Transfer: Supervision/safety;Ambulation;BSC;RW (BSC over toilet) Toilet Transfer Details (indicate cue type and reason): vc for safe hand placement Toileting- Clothing Manipulation and Hygiene: Supervision/safety;Sit to/from stand       Functional mobility during ADLs: Supervision/safety;Rolling walker General ADL Comments: at adequate level for discharge. no further questions or concerns from Pt or husband      Vision                     Perception     Praxis      Cognition   Behavior During Therapy: WFL for tasks assessed/performed Overall Cognitive Status: Within Functional Limits for tasks assessed                       Extremity/Trunk Assessment               Exercises     Shoulder Instructions       General Comments      Pertinent Vitals/ Pain       Pain Assessment: 0-10 Pain Score: 6  Faces Pain Scale: Hurts little more Pain Location: right hip with weight bearing Pain Descriptors / Indicators: Aching;Sore Pain Intervention(s): Monitored during session;Repositioned;Ice applied;RN gave pain meds during session  Home Living  Prior Functioning/Environment              Frequency  Min 2X/week        Progress Toward Goals  OT Goals(current goals can now be found in the care plan section)  Progress towards OT goals: Progressing toward goals  Acute Rehab OT Goals Patient Stated Goal: Go to grandchildren's sporting events OT Goal Formulation: With patient/family Time For Goal Achievement: 08/14/16 Potential to Achieve Goals: Good  Plan Discharge plan remains appropriate    Co-evaluation                 End of Session Equipment Utilized During Treatment: Gait belt;Rolling walker   Activity Tolerance Patient  tolerated treatment well   Patient Left in chair;with call bell/phone within reach;with family/visitor present   Nurse Communication Mobility status        Time: [id number] OT Time Calculation (min): 25 min  Charges: OT General Charges $OT Visit: 1 Procedure OT Treatments $Self Care/Home Management : 23-37 mins  Jaci Carrel 08/08/2016, 1:21 PM  Hulda Humphrey OTR/L [phone]

## 2016-08-08 NOTE — Progress Notes (Signed)
Subjective: 3 Days Post-Op Procedure(s) (LRB): TOTAL HIP ARTHROPLASTY ANTERIOR APPROACH (Right)   Patient doing much better this morning. She is sitting up at her bedside eating breakfast and is back to her normal self.  Activity level:  wbat Diet tolerance:  ok Voiding:  ok Patient reports pain as mild.    Objective: Vital signs in last 24 hours: Temp:  [98.1 F (36.7 C)-100.2 F (37.9 C)] 98.9 F (37.2 C) (11/03 [id number]) Pulse Rate:  [76-84] 84 (11/03 0614) Resp:  [16-17] 16 (11/03 0614) BP: (106-128)/(46-62) 115/46 (11/03 0614) SpO2:  [93 %-98 %] 96 % (11/03 0614) Weight:  [72 kg (158 lb 12.8 oz)] 72 kg (158 lb 12.8 oz) (11/03 0614)  Labs:  Recent Labs  08/06/16 0953 08/08/16 0241  HGB 10.0* 8.8*    Recent Labs  08/06/16 0953 08/08/16 0241  WBC 5.9 5.0  RBC 3.14* 2.75*  HCT 31.2* 27.4*  PLT 125* 114*    Recent Labs  08/06/16 0953 08/08/16 0241  NA 137 138  K 4.0 3.3*  CL 103 103  CO2 30 29  BUN 9 10  CREATININE 0.77 0.77  GLUCOSE 127* 93  CALCIUM 8.8* 8.6*   No results for input(s): LABPT, INR in the last 72 hours.  Physical Exam:  Neurologically intact ABD soft Neurovascular intact Sensation intact distally Intact pulses distally Dorsiflexion/Plantar flexion intact Incision: dressing C/D/I and no drainage No cellulitis present Compartment soft  Assessment/Plan:  3 Days Post-Op Procedure(s) (LRB): TOTAL HIP ARTHROPLASTY ANTERIOR APPROACH (Right) Advance diet Up with therapy Discharge home with home health today after PT this morning. Continue on ASA 325mg  BID x 4 weeks post op. Follow up in office 2 weeks post op. Continue tylenol only for pain.  Jazell Rosenau, Larwance Sachs 08/08/2016, 7:35 AM

## 2016-08-08 NOTE — Discharge Summary (Signed)
Patient ID: Tiffany Caldwell MRN: [id number] DOB/AGE: [birth year] 77 y.o.  Admit date: 08/05/2016 Discharge date: 08/08/2016  Admission Diagnoses:  Principal Problem:   Primary localized osteoarthritis of right hip Active Problems:   Primary osteoarthritis of right hip   Bibasilar crackles   Fever of unknown origin   Hypoxemia   Somnolence   Discharge Diagnoses:  Same  Past Medical History:  Diagnosis Date  . Allergy   . Arthritis   . Breast cancer ([place])    left breast  . Dyspnea   . Emphysema lung ([place])   . GERD (gastroesophageal reflux disease)   . Hyperlipidemia   . Hypertension   . Irritable bowel syndrome (IBS)   . Lung disorder    leison  . Pinched nerve    back and left leg    Surgeries: Procedure(s): TOTAL HIP ARTHROPLASTY ANTERIOR APPROACH on 08/05/2016   Consultants:   Discharged Condition: Improved  Hospital Course: EVIAN MERISIER is an 75 y.o. female who was admitted 08/05/2016 for operative treatment ofPrimary localized osteoarthritis of right hip. Patient has severe unremitting pain that affects sleep, daily activities, and work/hobbies. After pre-op clearance the patient was taken to the operating room on 08/05/2016 and underwent  Procedure(s): TOTAL HIP ARTHROPLASTY ANTERIOR APPROACH.    Patient was given perioperative antibiotics: Anti-infectives    Start     Dose/Rate Route Frequency Ordered Stop   08/05/16 1400  ceFAZolin (ANCEF) IVPB 2g/100 mL premix     2 g 200 mL/hr over 30 Minutes Intravenous Every 6 hours 08/05/16 1318 08/05/16 2100   08/05/16 0549  vancomycin (VANCOCIN) IVPB 1000 mg/200 mL premix     1,000 mg 200 mL/hr over 60 Minutes Intravenous On call to O.R. 08/05/16 0549 08/05/16 [id number]       Patient was given sequential compression devices, early ambulation, and chemoprophylaxis to prevent DVT.  Patient had some post op confusion and excessive drowsiness. Medicine team was consulted and narcan was administered. Patient doing  much better now. She was not metabolizing her pain medicine properly but is doing much better on tylenol now.   Patient benefited maximally from hospital stay and there were no complications.    Recent vital signs: Patient Vitals for the past 24 hrs:  BP Temp Temp src Pulse Resp SpO2 Weight  08/08/16 0614 (!) 115/46 98.9 F (37.2 C) Oral 84 16 96 % 72 kg (158 lb 12.8 oz)  08/07/16 2024 106/62 100.2 F (37.9 C) Oral 80 17 93 % -  08/07/16 1500 (!) 128/50 98.1 F (36.7 C) Oral 76 17 98 % -  08/07/16 0954 - - - - - 98 % -     Recent laboratory studies:  Recent Labs  08/06/16 0953 08/08/16 0241  WBC 5.9 5.0  HGB 10.0* 8.8*  HCT 31.2* 27.4*  PLT 125* 114*  NA 137 138  K 4.0 3.3*  CL 103 103  CO2 30 29  BUN 9 10  CREATININE 0.77 0.77  GLUCOSE 127* 93  CALCIUM 8.8* 8.6*     Discharge Medications:     Medication List    STOP taking these medications   aspirin 81 MG tablet Replaced by:  aspirin 325 MG EC tablet     TAKE these medications   acetaminophen 500 MG tablet Commonly known as:  TYLENOL Take 1,000 mg by mouth 2 (two) times daily.   aspirin 325 MG EC tablet Take 1 tablet (325 mg total) by mouth 2 (two) times daily after a meal.  Replaces:  aspirin 81 MG tablet   CALCIUM 600 + D PO Take 600 mg by mouth daily.   cetirizine 10 MG tablet Commonly known as:  ZYRTEC Take 10 mg by mouth daily.   docusate sodium 250 MG capsule Commonly known as:  COLACE Take 500 mg by mouth at bedtime.   fish oil-omega-3 fatty acids 1000 MG capsule Take 1 g by mouth 2 (two) times daily.   gabapentin 300 MG capsule Commonly known as:  NEURONTIN Take 300 mg by mouth 3 (three) times daily. And takes 2 capsules HS   GLUCOSAMINE CHONDR 1500 COMPLX PO Take 1 tablet by mouth 2 (two) times daily.   hyoscyamine 0.375 MG 12 hr tablet Commonly known as:  LEVBID Take 0.375 mg by mouth every 12 (twelve) hours as needed (IBS).   multivitamin tablet Take 1 tablet by mouth daily.    simvastatin 20 MG tablet Commonly known as:  ZOCOR Take 20 mg by mouth at bedtime.   valsartan-hydrochlorothiazide 80-12.5 MG tablet Commonly known as:  DIOVAN-HCT Take 1 tablet by mouth daily.       Diagnostic Studies: Dg Chest 2 View  Result Date: 07/28/2016 CLINICAL DATA:  Hip replacement.  Preoperative chest x-ray . EXAM: CHEST  2 VIEW COMPARISON:  CT 06/25/2016.  Chest x-ray 02/18/2016. FINDINGS: Mediastinum hilar structures are unremarkable. Prior CABG. Heart size stable. No pulmonary venous congestion. Stable right suprahilar density and biapical pleural parenchymal thickening. Surgical clips in the abdomen. No acute bony abnormality. IMPRESSION: 1. Stable right suprahilar density. Stable biapical pleural parenchymal thickening. Reference is made to prior CT report 06/25/2016 . No acute cardiopulmonary disease. 2. Prior CABG.  Heart size stable. Electronically Signed   By: Marcello Moores  Register   On: 07/28/2016 12:31   Dg Chest Port 1 View  Result Date: 08/06/2016 CLINICAL DATA:  Weakness.  Bibasilar crackles. EXAM: PORTABLE CHEST 1 VIEW COMPARISON:  Chest radiograph 07/28/2016 FINDINGS: Suprahilar density in the right lung is unchanged. Biapical scarring is again seen. There is mild blunting of both costophrenic angles. No focal consolidation. The degree of inflation is decreased relative to the prior study. No pneumothorax. No overt pulmonary edema. CABG markers, stent, median sternotomy wires and left axillary surgical clips are again noted. IMPRESSION: 1. Shallow lung inflation. Blunting of the costophrenic angles may indicate small pleural effusions. 2. Unchanged right suprahilar density and biapical pleural scarring. 3. No overt pulmonary edema. Electronically Signed   By: Ulyses Jarred M.D.   On: 08/06/2016 19:13   Dg C-arm 1-60 Min  Result Date: 08/05/2016 CLINICAL DATA:  Right hip replacement. EXAM: OPERATIVE right HIP (WITH PELVIS IF PERFORMED) 2 VIEWS TECHNIQUE: Fluoroscopic  spot image(s) were submitted for interpretation post-operatively. COMPARISON:  No recent prior. FINDINGS: Total right hip replacement. Hardware intact. Good anatomic alignment. IMPRESSION: Total right hip replacement with good anatomic alignment. Electronically Signed   By: Marcello Moores  Register   On: 08/05/2016 09:23   Dg Hip Operative Unilat With Pelvis Right  Result Date: 08/05/2016 CLINICAL DATA:  Right hip replacement. EXAM: OPERATIVE right HIP (WITH PELVIS IF PERFORMED) 2 VIEWS TECHNIQUE: Fluoroscopic spot image(s) were submitted for interpretation post-operatively. COMPARISON:  No recent prior. FINDINGS: Total right hip replacement. Hardware intact. Good anatomic alignment. IMPRESSION: Total right hip replacement with good anatomic alignment. Electronically Signed   By: Marcello Moores  Register   On: 08/05/2016 09:23    Disposition:   Discharge Instructions    Call MD / Call 911    Complete by:  As directed  If you experience chest pain or shortness of breath, CALL 911 and be transported to the hospital emergency room.  If you develope a fever above 101 F, pus (white drainage) or increased drainage or redness at the wound, or calf pain, call your surgeon's office.   Constipation Prevention    Complete by:  As directed    Drink plenty of fluids.  Prune juice may be helpful.  You may use a stool softener, such as Colace (over the counter) 100 mg twice a day.  Use MiraLax (over the counter) for constipation as needed.   Diet - low sodium heart healthy    Complete by:  As directed    Discharge instructions    Complete by:  As directed    INSTRUCTIONS AFTER JOINT REPLACEMENT   Remove items at home which could result in a fall. This includes throw rugs or furniture in walking pathways ICE to the affected joint every three hours while awake for 30 minutes at a time, for at least the first 3-5 days, and then as needed for pain and swelling.  Continue to use ice for pain and swelling. You may notice  swelling that will progress down to the foot and ankle.  This is normal after surgery.  Elevate your leg when you are not up walking on it.   Continue to use the breathing machine you got in the hospital (incentive spirometer) which will help keep your temperature down.  It is common for your temperature to cycle up and down following surgery, especially at night when you are not up moving around and exerting yourself.  The breathing machine keeps your lungs expanded and your temperature down.   DIET:  As you were doing prior to hospitalization, we recommend a well-balanced diet.  DRESSING / WOUND CARE / SHOWERING  You may shower 3 days after surgery, but keep the wounds dry during showering.  You may use an occlusive plastic wrap (Press'n Seal for example), NO SOAKING/SUBMERGING IN THE BATHTUB.  If the bandage gets wet, change with a clean dry gauze.  If the incision gets wet, pat the wound dry with a clean towel.  ACTIVITY  Increase activity slowly as tolerated, but follow the weight bearing instructions below.   No driving for 6 weeks or until further direction given by your physician.  You cannot drive while taking narcotics.  No lifting or carrying greater than 10 lbs. until further directed by your surgeon. Avoid periods of inactivity such as sitting longer than an hour when not asleep. This helps prevent blood clots.  You may return to work once you are authorized by your doctor.     WEIGHT BEARING   Weight bearing as tolerated with assist device (walker, cane, etc) as directed, use it as long as suggested by your surgeon or therapist, typically at least 4-6 weeks.   EXERCISES  Results after joint replacement surgery are often greatly improved when you follow the exercise, range of motion and muscle strengthening exercises prescribed by your doctor. Safety measures are also important to protect the joint from further injury. Any time any of these exercises cause you to have  increased pain or swelling, decrease what you are doing until you are comfortable again and then slowly increase them. If you have problems or questions, call your caregiver or physical therapist for advice.   Rehabilitation is important following a joint replacement. After just a few days of immobilization, the muscles of the leg can become weakened and  shrink (atrophy).  These exercises are designed to build up the tone and strength of the thigh and leg muscles and to improve motion. Often times heat used for twenty to thirty minutes before working out will loosen up your tissues and help with improving the range of motion but do not use heat for the first two weeks following surgery (sometimes heat can increase post-operative swelling).   These exercises can be done on a training (exercise) mat, on the floor, on a table or on a bed. Use whatever works the best and is most comfortable for you.    Use music or television while you are exercising so that the exercises are a pleasant break in your day. This will make your life better with the exercises acting as a break in your routine that you can look forward to.   Perform all exercises about fifteen times, three times per day or as directed.  You should exercise both the operative leg and the other leg as well.   Exercises include:   Quad Sets - Tighten up the muscle on the front of the thigh (Quad) and hold for 5-10 seconds.   Straight Leg Raises - With your knee straight (if you were given a brace, keep it on), lift the leg to 60 degrees, hold for 3 seconds, and slowly lower the leg.  Perform this exercise against resistance later as your leg gets stronger.  Leg Slides: Lying on your back, slowly slide your foot toward your buttocks, bending your knee up off the floor (only go as far as is comfortable). Then slowly slide your foot back down until your leg is flat on the floor again.  Angel Wings: Lying on your back spread your legs to the side as far  apart as you can without causing discomfort.  Hamstring Strength:  Lying on your back, push your heel against the floor with your leg straight by tightening up the muscles of your buttocks.  Repeat, but this time bend your knee to a comfortable angle, and push your heel against the floor.  You may put a pillow under the heel to make it more comfortable if necessary.   A rehabilitation program following joint replacement surgery can speed recovery and prevent re-injury in the future due to weakened muscles. Contact your doctor or a physical therapist for more information on knee rehabilitation.    CONSTIPATION  Constipation is defined medically as fewer than three stools per week and severe constipation as less than one stool per week.  Even if you have a regular bowel pattern at home, your normal regimen is likely to be disrupted due to multiple reasons following surgery.  Combination of anesthesia, postoperative narcotics, change in appetite and fluid intake all can affect your bowels.   YOU MUST use at least one of the following options; they are listed in order of increasing strength to get the job done.  They are all available over the counter, and you may need to use some, POSSIBLY even all of these options:    Drink plenty of fluids (prune juice may be helpful) and high fiber foods Colace 100 mg by mouth twice a day  Senokot for constipation as directed and as needed Dulcolax (bisacodyl), take with full glass of water  Miralax (polyethylene glycol) once or twice a day as needed.  If you have tried all these things and are unable to have a bowel movement in the first 3-4 days after surgery call either your surgeon  or your primary doctor.    If you experience loose stools or diarrhea, hold the medications until you stool forms back up.  If your symptoms do not get better within 1 week or if they get worse, check with your doctor.  If you experience "the worst abdominal pain ever" or develop  nausea or vomiting, please contact the office immediately for further recommendations for treatment.   ITCHING:  If you experience itching with your medications, try taking only a single pain pill, or even half a pain pill at a time.  You can also use Benadryl over the counter for itching or also to help with sleep.   TED HOSE STOCKINGS:  Use stockings on both legs until for at least 2 weeks or as directed by physician office. They may be removed at night for sleeping.  MEDICATIONS:  See your medication summary on the "After Visit Summary" that nursing will review with you.  You may have some home medications which will be placed on hold until you complete the course of blood thinner medication.  It is important for you to complete the blood thinner medication as prescribed.  PRECAUTIONS:  If you experience chest pain or shortness of breath - call 911 immediately for transfer to the hospital emergency department.   If you develop a fever greater that 101 F, purulent drainage from wound, increased redness or drainage from wound, foul odor from the wound/dressing, or calf pain - CONTACT YOUR SURGEON.                                                   FOLLOW-UP APPOINTMENTS:  If you do not already have a post-op appointment, please call the office for an appointment to be seen by your surgeon.  Guidelines for how soon to be seen are listed in your "After Visit Summary", but are typically between 1-4 weeks after surgery.  OTHER INSTRUCTIONS:   Knee Replacement:  Do not place pillow under knee, focus on keeping the knee straight while resting. CPM instructions: 0-90 degrees, 2 hours in the morning, 2 hours in the afternoon, and 2 hours in the evening. Place foam block, curve side up under heel at all times except when in CPM or when walking.  DO NOT modify, tear, cut, or change the foam block in any way.  MAKE SURE YOU:  Understand these instructions.  Get help right away if you are not doing well or  get worse.    Thank you for letting us be a part of your medical care team.  It is a privilege we respect greatly.  We hope these instructions will help you stay on track for a fast and full recovery!   Increase activity slowly as tolerated    Complete by:  As directed       Follow-up Information    DALLDORF,PETER G, MD. Schedule an appointment as soon as possible for a visit in 2 week(s).   Specialty:  Orthopedic Surgery Contact information: [place] [address] [phone]        [facility] .   Specialty:  [place] Why:  Someone from [facility] will contact you to arrange start date and time for therapy. Contact information: [address] [place] [address] [phone]  Signed: Rich Fuchs 08/08/2016, 7:33 AM

## 2016-08-08 NOTE — Progress Notes (Addendum)
Patient not seen today as she already has discharge papers and ready to leave Noted low potassium and platelets trending down. PCP requested to Monitor platelets closely

## 2016-08-10 DIAGNOSIS — J449 Chronic obstructive pulmonary disease, unspecified: Secondary | ICD-10-CM | POA: Diagnosis not present

## 2016-08-10 DIAGNOSIS — Z7982 Long term (current) use of aspirin: Secondary | ICD-10-CM | POA: Diagnosis not present

## 2016-08-10 DIAGNOSIS — Z853 Personal history of malignant neoplasm of breast: Secondary | ICD-10-CM | POA: Diagnosis not present

## 2016-08-10 DIAGNOSIS — Z96641 Presence of right artificial hip joint: Secondary | ICD-10-CM | POA: Diagnosis not present

## 2016-08-10 DIAGNOSIS — Z471 Aftercare following joint replacement surgery: Secondary | ICD-10-CM | POA: Diagnosis not present

## 2016-08-10 DIAGNOSIS — I10 Essential (primary) hypertension: Secondary | ICD-10-CM | POA: Diagnosis not present

## 2016-08-10 DIAGNOSIS — I2510 Atherosclerotic heart disease of native coronary artery without angina pectoris: Secondary | ICD-10-CM | POA: Diagnosis not present

## 2016-08-10 DIAGNOSIS — Z87891 Personal history of nicotine dependence: Secondary | ICD-10-CM | POA: Diagnosis not present

## 2016-08-18 DIAGNOSIS — M1611 Unilateral primary osteoarthritis, right hip: Secondary | ICD-10-CM | POA: Diagnosis not present

## 2016-08-19 DIAGNOSIS — I2510 Atherosclerotic heart disease of native coronary artery without angina pectoris: Secondary | ICD-10-CM | POA: Diagnosis not present

## 2016-08-19 DIAGNOSIS — Z7982 Long term (current) use of aspirin: Secondary | ICD-10-CM | POA: Diagnosis not present

## 2016-08-19 DIAGNOSIS — I10 Essential (primary) hypertension: Secondary | ICD-10-CM | POA: Diagnosis not present

## 2016-08-19 DIAGNOSIS — Z853 Personal history of malignant neoplasm of breast: Secondary | ICD-10-CM | POA: Diagnosis not present

## 2016-08-19 DIAGNOSIS — Z96641 Presence of right artificial hip joint: Secondary | ICD-10-CM | POA: Diagnosis not present

## 2016-08-19 DIAGNOSIS — Z471 Aftercare following joint replacement surgery: Secondary | ICD-10-CM | POA: Diagnosis not present

## 2016-08-19 DIAGNOSIS — J449 Chronic obstructive pulmonary disease, unspecified: Secondary | ICD-10-CM | POA: Diagnosis not present

## 2016-08-19 DIAGNOSIS — Z87891 Personal history of nicotine dependence: Secondary | ICD-10-CM | POA: Diagnosis not present

## 2016-08-26 DIAGNOSIS — J449 Chronic obstructive pulmonary disease, unspecified: Secondary | ICD-10-CM | POA: Diagnosis not present

## 2016-08-26 DIAGNOSIS — Z471 Aftercare following joint replacement surgery: Secondary | ICD-10-CM | POA: Diagnosis not present

## 2016-08-26 DIAGNOSIS — Z7982 Long term (current) use of aspirin: Secondary | ICD-10-CM | POA: Diagnosis not present

## 2016-08-26 DIAGNOSIS — I10 Essential (primary) hypertension: Secondary | ICD-10-CM | POA: Diagnosis not present

## 2016-08-26 DIAGNOSIS — Z853 Personal history of malignant neoplasm of breast: Secondary | ICD-10-CM | POA: Diagnosis not present

## 2016-08-26 DIAGNOSIS — Z96641 Presence of right artificial hip joint: Secondary | ICD-10-CM | POA: Diagnosis not present

## 2016-08-26 DIAGNOSIS — Z87891 Personal history of nicotine dependence: Secondary | ICD-10-CM | POA: Diagnosis not present

## 2016-08-26 DIAGNOSIS — I2510 Atherosclerotic heart disease of native coronary artery without angina pectoris: Secondary | ICD-10-CM | POA: Diagnosis not present

## 2016-09-03 DIAGNOSIS — M1611 Unilateral primary osteoarthritis, right hip: Secondary | ICD-10-CM | POA: Diagnosis not present

## 2016-10-14 ENCOUNTER — Other Ambulatory Visit: Payer: Self-pay | Admitting: *Deleted

## 2016-10-14 ENCOUNTER — Ambulatory Visit (INDEPENDENT_AMBULATORY_CARE_PROVIDER_SITE_OTHER): Payer: PPO | Admitting: Thoracic Surgery (Cardiothoracic Vascular Surgery)

## 2016-10-14 ENCOUNTER — Encounter: Payer: Self-pay | Admitting: Thoracic Surgery (Cardiothoracic Vascular Surgery)

## 2016-10-14 VITALS — BP 106/58 | HR 75 | Resp 20 | Ht 66.0 in | Wt 145.0 lb

## 2016-10-14 DIAGNOSIS — R911 Solitary pulmonary nodule: Secondary | ICD-10-CM | POA: Diagnosis not present

## 2016-10-14 DIAGNOSIS — Z853 Personal history of malignant neoplasm of breast: Secondary | ICD-10-CM | POA: Diagnosis not present

## 2016-10-14 DIAGNOSIS — R918 Other nonspecific abnormal finding of lung field: Secondary | ICD-10-CM

## 2016-10-14 NOTE — Progress Notes (Signed)
[address]       [place] [address]             [phone]    HPI: Tiffany Caldwell returns to further discuss possible navigational bronchoscopy  She is a 76 year old woman with past medical history significant for coronary artery disease and emergent coronary bypass grafting, remote tobacco abuse, COPD, hypertension, dyslipidemia, breast cancer in 1990, and diverticulosis. She also has osteoarthritis and recently had a hip replacement by Dr. Latanya Maudlin.   Dr. Cyndia Bent has followed her for a right upper lobe lung nodule since late 2015. She had a CT-guided biopsy in June 2016. There is no sign of cancer. Cultures grew MAI. She cannot tolerate azithromycin so she was not treated. By March 2017 the wall of the mass was thicker and a repeat CT-guided biopsy was done. It was nondiagnostic. Back in the fall she had another CT which showed the cavity had filled in and the mass might be slightly larger in size. She was referred for possible navigational bronchoscopy. She wanted to have that done at the time of her hip replacement. But ultimately decided to go ahead with the hip replacement first. She now returns.  She had an adverse reaction to Dilaudid in the early postoperative period with altered mental status. Other than that her recovery has been unremarkable. She denies any fevers or chills or night sweats. Her weight is stable. She does not have a productive cough. She is not having any shortness of breath.  Past Medical History:  Diagnosis Date  . Allergy   . Arthritis   . Breast cancer ([place])    left breast  . Dyspnea   . Emphysema lung ([place])   . GERD (gastroesophageal reflux disease)   . Hyperlipidemia   . Hypertension   . Irritable bowel syndrome (IBS)   . Lung disorder    leison  . Pinched nerve    back and left leg     Current Outpatient Prescriptions  Medication Sig Dispense Refill  . acetaminophen (TYLENOL) 500 MG tablet Take 1,000 mg by mouth 2 (two)  times daily.     Marland Kitchen aspirin EC 81 MG tablet Take 81 mg by mouth daily.    . Calcium Carbonate-Vitamin D (CALCIUM 600 + D PO) Take 600 mg by mouth daily.      . cetirizine (ZYRTEC) 10 MG tablet Take 10 mg by mouth daily.    Marland Kitchen docusate sodium (COLACE) 250 MG capsule Take 500 mg by mouth at bedtime.     . fish oil-omega-3 fatty acids 1000 MG capsule Take 1 g by mouth 2 (two) times daily.     Marland Kitchen gabapentin (NEURONTIN) 300 MG capsule Take 300 mg by mouth 3 (three) times daily. And takes 2 capsules HS    . Glucosamine-Chondroit-Vit C-Mn (GLUCOSAMINE CHONDR 1500 COMPLX PO) Take 1 tablet by mouth 2 (two) times daily.    . hyoscyamine (LEVBID) 0.375 MG 12 hr tablet Take 0.375 mg by mouth every 12 (twelve) hours as needed (IBS).     . Multiple Vitamin (MULTIVITAMIN) tablet Take 1 tablet by mouth daily.      . simvastatin (ZOCOR) 20 MG tablet Take 20 mg by mouth at bedtime.      . valsartan-hydrochlorothiazide (DIOVAN-HCT) 80-12.5 MG per tablet Take 1 tablet by mouth daily.       No current facility-administered medications for this visit.     Physical Exam BP (!) 106/58   Pulse 75  Resp 20   Ht 5\' 6"  (1.676 m)   Wt 145 lb (65.8 kg)   LMP  (LMP Unknown)   SpO2 96% Comment: RA  BMI 23.89 kg/m  76 year old woman in no distress Alert and oriented 3 with no focal neurologic deficits No cervical or subclavicular adenopathy Cardiac regular rate and rhythm with no murmur Lungs clear with equal breath sounds bilaterally Abdomen soft nontender Extremities without clubbing cyanosis or edema  Diagnostic Tests: I reviewed her CT from September 2017.  Impression: Tiffany Caldwell is a 76 year old woman with a cavitary right lung mass. She has had 2 CT-guided needle biopsies. Cultures on 1 showed MAI, they were negative on the second biopsy. There was no evidence of carcinoma seen on either biopsy. The MAI was never treated due to an allergy to azithromycin.  Over time the nodule has gotten slightly  bigger and the cavity has filled in. This is consistent with untreated MAI, but the question persists as to whether this could be malignant. I discussed 3 options with her. First would be continued radiographic follow-up. Second would be navigational bronchoscopy to biopsy and culture the lesion again. She understands that even if this is negative, it does not definitively rule out the possibility of cancer, although that would be 3 negatives in a row making it less likely. Finally another option would be to do a right upper lobectomy to definitively diagnose and treat the lesion once and for all.  Of the 3 options she favors navigational bronchoscopy to rebiopsy the lesion.  I discussed the general nature of the procedure with her. She understands this is strictly diagnostic and not therapeutic. She understands that due to the long delay between her most recent scan in the procedure that she needs to have another CT done to reassess the nodule. She understands risks include those associated with general anesthesia. Procedure specific risks including failure to make a diagnosis, pneumothorax, and bleeding.  We will plan to do the procedure on an outpatient basis.  Plan: CT chest was super D protocol  Navigational bronchoscopy on Thursday, 10/23/2016  [name], MD [facility] [phone]

## 2016-10-21 ENCOUNTER — Ambulatory Visit
Admission: RE | Admit: 2016-10-21 | Discharge: 2016-10-21 | Disposition: A | Discharge status: Home / Self Care | Payer: PPO | Source: Ambulatory Visit | Attending: Thoracic Surgery (Cardiothoracic Vascular Surgery) | Admitting: Thoracic Surgery (Cardiothoracic Vascular Surgery)

## 2016-10-21 DIAGNOSIS — R918 Other nonspecific abnormal finding of lung field: Secondary | ICD-10-CM

## 2016-10-22 ENCOUNTER — Other Ambulatory Visit (HOSPITAL_COMMUNITY): Payer: Self-pay | Admitting: *Deleted

## 2016-10-22 ENCOUNTER — Inpatient Hospital Stay (HOSPITAL_COMMUNITY)
Admission: RE | Admit: 2016-10-22 | Discharge: 2016-10-22 | Disposition: A | Discharge status: Home / Self Care | Payer: PPO | Source: Ambulatory Visit

## 2016-10-22 NOTE — Pre-Procedure Instructions (Signed)
Tiffany Caldwell  10/22/2016    Your procedure is scheduled on Thursday, October 23, 2016 at 2:35 PM.   Report to [facility] Entrance "A" Admitting Office at 12:30 PM.   Call this number if you have problems the morning of surgery: [phone]   Remember:  Do not eat food or drink liquids after midnight tonight.  Take these medicines the morning of surgery with A SIP OF WATER: Cetirizine (Zyrtec), Gabapentin (Neurontin)   Do not wear jewelry, make-up or nail polish.  Do not wear lotions, powders or perfumes.  Do not shave 48 hours prior to surgery.    Do not bring valuables to the hospital.  [facility] is not responsible for any belongings or valuables.  Contacts, dentures or bridgework may not be worn into surgery.  Leave your suitcase in the car.  After surgery it may be brought to your room.  For patients admitted to the hospital, discharge time will be determined by your treatment team.  Patients discharged the day of surgery will not be allowed to drive home.   Special instructions:  [place] - Preparing for Surgery  Before surgery, you can play an important role.  Because skin is not sterile, your skin needs to be as free of germs as possible.  You can reduce the number of germs on you skin by washing with CHG (chlorahexidine gluconate) soap before surgery.  CHG is an antiseptic cleaner which kills germs and bonds with the skin to continue killing germs even after washing.  Please DO NOT use if you have an allergy to CHG or antibacterial soaps.  If your skin becomes reddened/irritated stop using the CHG and inform your nurse when you arrive at Short Stay.  Do not shave (including legs and underarms) for at least 48 hours prior to the first CHG shower.  You may shave your face.  Please follow these instructions carefully:   1.  Shower with CHG Soap the night before surgery and the                    morning of Surgery.  2.  If you choose to wash your hair,  wash your hair first as usual with your       normal shampoo.  3.  After you shampoo, rinse your hair and body thoroughly to remove the shampoo.  4.  Use CHG as you would any other liquid soap.  You can apply chg directly       to the skin and wash gently with scrungie or a clean washcloth.  5.  Apply the CHG Soap to your body ONLY FROM THE NECK DOWN.        Do not use on open wounds or open sores.  Avoid contact with your eyes, ears, mouth and genitals (private parts).  Wash genitals (private parts) with your normal soap.  6.  Wash thoroughly, paying special attention to the area where your surgery        will be performed.  7.  Thoroughly rinse your body with warm water from the neck down.  8.  DO NOT shower/wash with your normal soap after using and rinsing off       the CHG Soap.  9.  Pat yourself dry with a clean towel.            10.  Wear clean pajamas.            11.  Place  clean sheets on your bed the night of your first shower and do not        sleep with pets.  Day of Surgery  Do not apply any lotions the morning of surgery.  Please wear clean clothes to the hospital.   Please read over the fact sheets that you were given.

## 2016-10-22 NOTE — Progress Notes (Signed)
Spoke with pt today, she is cancelling her surgery for tomorrow due to an upper respiratory infection. She stated that she had tried calling the office but they were closed. Dr. Roxan Hockey is in surgery today and I called and spoke with Earnest Bailey, RN (circulating nurse) to notify him of surgery being cancelled.

## 2016-10-23 ENCOUNTER — Encounter (HOSPITAL_COMMUNITY): Admission: RE | Payer: Self-pay | Source: Ambulatory Visit

## 2016-10-23 ENCOUNTER — Ambulatory Visit (HOSPITAL_COMMUNITY)
Admission: RE | Admit: 2016-10-23 | Payer: PPO | Source: Ambulatory Visit | Admitting: Thoracic Surgery (Cardiothoracic Vascular Surgery)

## 2016-10-23 SURGERY — VIDEO BRONCHOSCOPY WITH ENDOBRONCHIAL NAVIGATION
Anesthesia: General

## 2016-11-03 ENCOUNTER — Other Ambulatory Visit: Payer: Self-pay | Admitting: *Deleted

## 2016-11-03 DIAGNOSIS — R911 Solitary pulmonary nodule: Secondary | ICD-10-CM

## 2016-11-03 DIAGNOSIS — M1611 Unilateral primary osteoarthritis, right hip: Secondary | ICD-10-CM | POA: Diagnosis not present

## 2016-11-20 ENCOUNTER — Encounter (HOSPITAL_COMMUNITY)
Admission: RE | Admit: 2016-11-20 | Discharge: 2016-11-20 | Disposition: A | Discharge status: Home / Self Care | Payer: PPO | Source: Ambulatory Visit | Attending: Thoracic Surgery (Cardiothoracic Vascular Surgery) | Admitting: Thoracic Surgery (Cardiothoracic Vascular Surgery)

## 2016-11-20 ENCOUNTER — Encounter (HOSPITAL_COMMUNITY): Payer: Self-pay

## 2016-11-20 DIAGNOSIS — R911 Solitary pulmonary nodule: Secondary | ICD-10-CM | POA: Diagnosis not present

## 2016-11-20 DIAGNOSIS — Z01812 Encounter for preprocedural laboratory examination: Secondary | ICD-10-CM | POA: Insufficient documentation

## 2016-11-20 HISTORY — DX: Other intervertebral disc degeneration, lumbosacral region without mention of lumbar back pain or lower extremity pain: M51.379

## 2016-11-20 HISTORY — DX: Other intervertebral disc degeneration, lumbosacral region: M51.37

## 2016-11-20 HISTORY — DX: Polyneuropathy, unspecified: G62.9

## 2016-11-20 HISTORY — DX: Pneumonia, unspecified organism: J18.9

## 2016-11-20 LAB — CBC
HEMATOCRIT: 41.6 % (ref 36.0–46.0)
Hemoglobin: 13.3 g/dL (ref 12.0–15.0)
MCH: 31.4 pg (ref 26.0–34.0)
MCHC: 32 g/dL (ref 30.0–36.0)
MCV: 98.3 fL (ref 78.0–100.0)
PLATELETS: 149 10*3/uL — AB (ref 150–400)
RBC: 4.23 MIL/uL (ref 3.87–5.11)
RDW: 13.3 % (ref 11.5–15.5)
WBC: 3.6 10*3/uL — AB (ref 4.0–10.5)

## 2016-11-20 LAB — COMPREHENSIVE METABOLIC PANEL
ALBUMIN: 4 g/dL (ref 3.5–5.0)
ALT: 21 U/L (ref 14–54)
AST: 36 U/L (ref 15–41)
Alkaline Phosphatase: 64 U/L (ref 38–126)
Anion gap: 12 (ref 5–15)
BILIRUBIN TOTAL: 0.6 mg/dL (ref 0.3–1.2)
BUN: 24 mg/dL — AB (ref 6–20)
CHLORIDE: 98 mmol/L — AB (ref 101–111)
CO2: 30 mmol/L (ref 22–32)
CREATININE: 1.14 mg/dL — AB (ref 0.44–1.00)
Calcium: 11.7 mg/dL — ABNORMAL HIGH (ref 8.9–10.3)
GFR calc Af Amer: 52 mL/min — ABNORMAL LOW (ref >60)
GFR, EST NON AFRICAN AMERICAN: 45 mL/min — AB (ref >60)
GLUCOSE: 111 mg/dL — AB (ref 65–99)
Potassium: 4.8 mmol/L (ref 3.5–5.1)
Sodium: 140 mmol/L (ref 135–145)
Total Protein: 7.5 g/dL (ref 6.5–8.1)

## 2016-11-20 LAB — APTT: APTT: 33 s (ref 24–36)

## 2016-11-20 LAB — PROTIME-INR
INR: 1.06
PROTHROMBIN TIME: 13.8 s (ref 11.4–15.2)

## 2016-11-20 NOTE — Pre-Procedure Instructions (Signed)
    Tiffany Caldwell  11/20/2016     Your procedure is scheduled on Thursday, February 22.  Report to [facility] Admitting at 6:00  AM                  Your surgery or procedure is scheduled for 8:00 AM   Call this number if you have problems the morning of surgery: [phone]                 For any other questions, please call [phone], Monday - Friday 8 AM - 4 PM.    Remember:  Do not eat food or drink liquids after midnight Wednesday, February 21.  Take these medicines the morning of surgery with A SIP OF WATER :cetirizine (ZYRTEC), gabapentin (NEURONTIN).               Take if needed: hyoscyamine (LEVBID), acetaminophen (TYLENOL).                    1 Week prior to surgery STOP taking  Aspirin Products (Goody Powder, Excedrin Migraine), Ibuprofen (Advil), Naproxen (Aleve), Vitamins and Herbal Products (ie Fish Oil).                          Hold Aspirin as instructed by Dr Koleen Nimrod.   Do not wear jewelry, make-up or nail polish.  Do not wear lotions, powders, or perfumes, or deodorant.  Do not shave 48 hours prior to surgery.    Do not bring valuables to the hospital.  [facility] is not responsible for any belongings or valuables.  Contacts, dentures or bridgework may not be worn into surgery.  Leave your suitcase in the car.  After surgery it may be brought to your room.  For patients admitted to the hospital, discharge time will be determined by your treatment team.  Patients discharged the day of surgery will not be allowed to drive home.   Name and phone number of your driver:  -  Special instructions:Review  [place] - Preparing For Surgery.  Please read over the following fact sheets that you were given Review  [place] - Preparing For Surgery, Coughing and Deep Breathing and Pain Booklet

## 2016-11-24 DIAGNOSIS — R739 Hyperglycemia, unspecified: Secondary | ICD-10-CM | POA: Diagnosis not present

## 2016-11-24 DIAGNOSIS — I10 Essential (primary) hypertension: Secondary | ICD-10-CM | POA: Diagnosis not present

## 2016-11-24 DIAGNOSIS — E210 Primary hyperparathyroidism: Secondary | ICD-10-CM | POA: Diagnosis not present

## 2016-11-24 DIAGNOSIS — M940 Chondrocostal junction syndrome [Tietze]: Secondary | ICD-10-CM | POA: Diagnosis not present

## 2016-11-27 ENCOUNTER — Ambulatory Visit (HOSPITAL_COMMUNITY): Payer: PPO

## 2016-11-27 ENCOUNTER — Ambulatory Visit (HOSPITAL_COMMUNITY): Payer: PPO | Admitting: Anesthesiology

## 2016-11-27 ENCOUNTER — Ambulatory Visit (HOSPITAL_COMMUNITY)
Admission: RE | Admit: 2016-11-27 | Discharge: 2016-11-27 | Disposition: A | Discharge status: Home / Self Care | Payer: PPO | Source: Ambulatory Visit | Attending: Thoracic Surgery (Cardiothoracic Vascular Surgery) | Admitting: Thoracic Surgery (Cardiothoracic Vascular Surgery)

## 2016-11-27 ENCOUNTER — Encounter (HOSPITAL_COMMUNITY)
Admission: RE | Disposition: A | Discharge status: Home / Self Care | Payer: Self-pay | Source: Ambulatory Visit | Attending: Thoracic Surgery (Cardiothoracic Vascular Surgery)

## 2016-11-27 ENCOUNTER — Encounter (HOSPITAL_COMMUNITY): Payer: Self-pay | Admitting: Surgery

## 2016-11-27 DIAGNOSIS — Z888 Allergy status to other drugs, medicaments and biological substances status: Secondary | ICD-10-CM | POA: Insufficient documentation

## 2016-11-27 DIAGNOSIS — Z885 Allergy status to narcotic agent status: Secondary | ICD-10-CM | POA: Insufficient documentation

## 2016-11-27 DIAGNOSIS — Z853 Personal history of malignant neoplasm of breast: Secondary | ICD-10-CM | POA: Diagnosis not present

## 2016-11-27 DIAGNOSIS — Z419 Encounter for procedure for purposes other than remedying health state, unspecified: Secondary | ICD-10-CM

## 2016-11-27 DIAGNOSIS — J850 Gangrene and necrosis of lung: Secondary | ICD-10-CM | POA: Diagnosis not present

## 2016-11-27 DIAGNOSIS — Z951 Presence of aortocoronary bypass graft: Secondary | ICD-10-CM | POA: Diagnosis not present

## 2016-11-27 DIAGNOSIS — J439 Emphysema, unspecified: Secondary | ICD-10-CM | POA: Insufficient documentation

## 2016-11-27 DIAGNOSIS — Z87891 Personal history of nicotine dependence: Secondary | ICD-10-CM | POA: Diagnosis not present

## 2016-11-27 DIAGNOSIS — E785 Hyperlipidemia, unspecified: Secondary | ICD-10-CM | POA: Diagnosis not present

## 2016-11-27 DIAGNOSIS — K589 Irritable bowel syndrome without diarrhea: Secondary | ICD-10-CM | POA: Diagnosis not present

## 2016-11-27 DIAGNOSIS — K219 Gastro-esophageal reflux disease without esophagitis: Secondary | ICD-10-CM | POA: Diagnosis not present

## 2016-11-27 DIAGNOSIS — M1990 Unspecified osteoarthritis, unspecified site: Secondary | ICD-10-CM | POA: Diagnosis not present

## 2016-11-27 DIAGNOSIS — J984 Other disorders of lung: Secondary | ICD-10-CM | POA: Insufficient documentation

## 2016-11-27 DIAGNOSIS — I2510 Atherosclerotic heart disease of native coronary artery without angina pectoris: Secondary | ICD-10-CM | POA: Insufficient documentation

## 2016-11-27 DIAGNOSIS — Z881 Allergy status to other antibiotic agents status: Secondary | ICD-10-CM | POA: Insufficient documentation

## 2016-11-27 DIAGNOSIS — K644 Residual hemorrhoidal skin tags: Secondary | ICD-10-CM | POA: Diagnosis not present

## 2016-11-27 DIAGNOSIS — Z8719 Personal history of other diseases of the digestive system: Secondary | ICD-10-CM | POA: Insufficient documentation

## 2016-11-27 DIAGNOSIS — R911 Solitary pulmonary nodule: Secondary | ICD-10-CM

## 2016-11-27 DIAGNOSIS — Z96649 Presence of unspecified artificial hip joint: Secondary | ICD-10-CM | POA: Diagnosis not present

## 2016-11-27 DIAGNOSIS — Z7982 Long term (current) use of aspirin: Secondary | ICD-10-CM | POA: Insufficient documentation

## 2016-11-27 DIAGNOSIS — R918 Other nonspecific abnormal finding of lung field: Secondary | ICD-10-CM | POA: Diagnosis not present

## 2016-11-27 DIAGNOSIS — I10 Essential (primary) hypertension: Secondary | ICD-10-CM | POA: Insufficient documentation

## 2016-11-27 DIAGNOSIS — R222 Localized swelling, mass and lump, trunk: Secondary | ICD-10-CM | POA: Diagnosis not present

## 2016-11-27 DIAGNOSIS — J449 Chronic obstructive pulmonary disease, unspecified: Secondary | ICD-10-CM | POA: Diagnosis not present

## 2016-11-27 HISTORY — PX: VIDEO BRONCHOSCOPY WITH ENDOBRONCHIAL NAVIGATION: SHX6175

## 2016-11-27 SURGERY — VIDEO BRONCHOSCOPY WITH ENDOBRONCHIAL NAVIGATION
Anesthesia: General | Site: Chest

## 2016-11-27 MED ORDER — LACTATED RINGERS IV SOLN
INTRAVENOUS | Status: DC
  Administered 2016-11-27 (×2): via INTRAVENOUS

## 2016-11-27 MED ORDER — 0.9 % SODIUM CHLORIDE (POUR BTL) OPTIME
TOPICAL | Status: DC | PRN
  Administered 2016-11-27: 1000 mL

## 2016-11-27 MED ORDER — SUCCINYLCHOLINE CHLORIDE 200 MG/10ML IV SOSY
PREFILLED_SYRINGE | INTRAVENOUS | Status: AC
  Filled 2016-11-27: qty 10

## 2016-11-27 MED ORDER — SUGAMMADEX SODIUM 200 MG/2ML IV SOLN
INTRAVENOUS | Status: DC | PRN
  Administered 2016-11-27: 200 mg via INTRAVENOUS

## 2016-11-27 MED ORDER — FENTANYL CITRATE (PF) 100 MCG/2ML IJ SOLN
INTRAMUSCULAR | Status: DC | PRN
  Administered 2016-11-27: 50 ug via INTRAVENOUS

## 2016-11-27 MED ORDER — ONDANSETRON HCL 4 MG/2ML IJ SOLN
INTRAMUSCULAR | Status: AC
  Filled 2016-11-27: qty 2

## 2016-11-27 MED ORDER — GLYCOPYRROLATE 0.2 MG/ML IV SOSY
PREFILLED_SYRINGE | INTRAVENOUS | Status: DC | PRN
  Administered 2016-11-27 (×2): .2 mg via INTRAVENOUS

## 2016-11-27 MED ORDER — PROPOFOL 10 MG/ML IV BOLUS
INTRAVENOUS | Status: DC | PRN
  Administered 2016-11-27: 150 mg via INTRAVENOUS

## 2016-11-27 MED ORDER — ROCURONIUM BROMIDE 50 MG/5ML IV SOSY
PREFILLED_SYRINGE | INTRAVENOUS | Status: AC
  Filled 2016-11-27: qty 10

## 2016-11-27 MED ORDER — SUGAMMADEX SODIUM 200 MG/2ML IV SOLN
INTRAVENOUS | Status: AC
  Filled 2016-11-27: qty 2

## 2016-11-27 MED ORDER — ROCURONIUM BROMIDE 100 MG/10ML IV SOLN
INTRAVENOUS | Status: DC | PRN
  Administered 2016-11-27: 10 mg via INTRAVENOUS
  Administered 2016-11-27: 40 mg via INTRAVENOUS
  Administered 2016-11-27: 10 mg via INTRAVENOUS

## 2016-11-27 MED ORDER — PROPOFOL 10 MG/ML IV BOLUS
INTRAVENOUS | Status: AC
  Filled 2016-11-27: qty 20

## 2016-11-27 MED ORDER — LIDOCAINE HCL (CARDIAC) 20 MG/ML IV SOLN
INTRAVENOUS | Status: DC | PRN
  Administered 2016-11-27: 60 mg via INTRAVENOUS

## 2016-11-27 MED ORDER — MIDAZOLAM HCL 2 MG/2ML IJ SOLN
INTRAMUSCULAR | Status: AC
  Filled 2016-11-27: qty 2

## 2016-11-27 MED ORDER — ONDANSETRON HCL 4 MG/2ML IJ SOLN
INTRAMUSCULAR | Status: DC | PRN
  Administered 2016-11-27: 4 mg via INTRAVENOUS

## 2016-11-27 MED ORDER — EPHEDRINE 5 MG/ML INJ
INTRAVENOUS | Status: AC
  Filled 2016-11-27: qty 10

## 2016-11-27 MED ORDER — FENTANYL CITRATE (PF) 100 MCG/2ML IJ SOLN: 25.0000 ug | INTRAMUSCULAR | Status: DC | PRN

## 2016-11-27 MED ORDER — FENTANYL CITRATE (PF) 250 MCG/5ML IJ SOLN
INTRAMUSCULAR | Status: AC
  Filled 2016-11-27: qty 5

## 2016-11-27 MED ORDER — PHENYLEPHRINE HCL 10 MG/ML IJ SOLN
INTRAVENOUS | Status: DC | PRN
  Administered 2016-11-27: 40 ug/min via INTRAVENOUS

## 2016-11-27 SURGICAL SUPPLY — 36 items
ADAPTER BRONCH F/PENTAX (ADAPTER) ×3 IMPLANT
BRUSH SUPERTRAX BIOPSY (INSTRUMENTS) IMPLANT
BRUSH SUPERTRAX NDL-TIP CYTO (INSTRUMENTS) ×3 IMPLANT
CANISTER SUCTION 2500CC (MISCELLANEOUS) ×3 IMPLANT
CHANNEL WORK EXTEND EDGE 180 (KITS) IMPLANT
CHANNEL WORK EXTEND EDGE 45 (KITS) IMPLANT
CHANNEL WORK EXTEND EDGE 90 (KITS) IMPLANT
CONT SPEC 4OZ CLIKSEAL STRL BL (MISCELLANEOUS) ×6 IMPLANT
COVER TABLE BACK 60X90 (DRAPES) ×3 IMPLANT
FILTER STRAW FLUID ASPIR (MISCELLANEOUS) IMPLANT
FORCEPS BIOP SUPERTRX PREMAR (INSTRUMENTS) ×3 IMPLANT
GAUZE SPONGE 4X4 12PLY STRL (GAUZE/BANDAGES/DRESSINGS) ×3 IMPLANT
GLOVE SURG SIGNA 7.5 PF LTX (GLOVE) ×3 IMPLANT
GOWN STRL REUS W/ TWL XL LVL3 (GOWN DISPOSABLE) ×1 IMPLANT
GOWN STRL REUS W/TWL XL LVL3 (GOWN DISPOSABLE) ×2
KIT CLEAN ENDO COMPLIANCE (KITS) ×3 IMPLANT
KIT PROCEDURE EDGE 180 (KITS) ×3 IMPLANT
KIT PROCEDURE EDGE 45 (KITS) IMPLANT
KIT PROCEDURE EDGE 90 (KITS) IMPLANT
KIT ROOM TURNOVER OR (KITS) ×3 IMPLANT
MARKER SKIN DUAL TIP RULER LAB (MISCELLANEOUS) ×3 IMPLANT
NEEDLE SUPERTRX PREMARK BIOPSY (NEEDLE) ×6 IMPLANT
NS IRRIG 1000ML POUR BTL (IV SOLUTION) ×3 IMPLANT
OIL SILICONE PENTAX (PARTS (SERVICE/REPAIRS)) ×3 IMPLANT
PAD ARMBOARD 7.5X6 YLW CONV (MISCELLANEOUS) ×6 IMPLANT
PATCHES PATIENT (LABEL) ×9 IMPLANT
SYR 20CC LL (SYRINGE) ×3 IMPLANT
SYR 20ML ECCENTRIC (SYRINGE) ×3 IMPLANT
SYR 30ML LL (SYRINGE) ×3 IMPLANT
SYR 5ML LL (SYRINGE) ×3 IMPLANT
TOWEL OR 17X24 6PK STRL BLUE (TOWEL DISPOSABLE) ×3 IMPLANT
TRAP SPECIMEN MUCOUS 40CC (MISCELLANEOUS) ×3 IMPLANT
TUBE CONNECTING 20'X1/4 (TUBING) ×2
TUBE CONNECTING 20X1/4 (TUBING) ×4 IMPLANT
UNDERPAD 30X30 (UNDERPADS AND DIAPERS) ×3 IMPLANT
WATER STERILE IRR 1000ML POUR (IV SOLUTION) ×3 IMPLANT

## 2016-11-27 NOTE — Transfer of Care (Signed)
Immediate Anesthesia Transfer of Care Note  Patient: Tiffany Caldwell  Procedure(s) Performed: Procedure(s): VIDEO BRONCHOSCOPY WITH ENDOBRONCHIAL NAVIGATION (N/A)  Patient Location: PACU  Anesthesia Type:General  Level of Consciousness: lethargic and responds to stimulation  Airway & Oxygen Therapy: Patient Spontanous Breathing and Patient connected to nasal cannula oxygen  Post-op Assessment: Report given to RN  Post vital signs: Reviewed and stable  Last Vitals: There were no vitals filed for this visit.  Last Pain: There were no vitals filed for this visit.    Patients Stated Pain Goal: 3 ([id number])  Complications: No apparent anesthesia complications

## 2016-11-27 NOTE — H&P (Signed)
[address]       [place] [address]             [phone]               HPI: Tiffany Caldwell returns to further discuss possible navigational bronchoscopy  She is a 76 year old woman with past medical history significant for coronary artery disease and emergent coronary bypass grafting, remote tobacco abuse, COPD, hypertension, dyslipidemia, breast cancer in 1990, and diverticulosis. She also has osteoarthritis and recently had a hip replacement by Dr. Latanya Maudlin.   Dr. Cyndia Bent has followed her for a right upper lobe lung nodule since late 2015. She had a CT-guided biopsy in June 2016. There is no sign of cancer. Cultures grew MAI. She cannot tolerate azithromycin so she was not treated. By March 2017 the wall of the mass was thicker and a repeat CT-guided biopsy was done. It was nondiagnostic. Back in the fall she had another CT which showed the cavity had filled in and the mass might be slightly larger in size. She was referred for possible navigational bronchoscopy. She wanted to have that done at the time of her hip replacement. But ultimately decided to go ahead with the hip replacement first. She now returns.  She had an adverse reaction to Dilaudid in the early postoperative period with altered mental status. Other than that her recovery has been unremarkable. She denies any fevers or chills or night sweats. Her weight is stable. She does not have a productive cough. She is not having any shortness of breath.      Past Medical History:  Diagnosis Date  . Allergy   . Arthritis   . Breast cancer ([place])    left breast  . Dyspnea   . Emphysema lung ([place])   . GERD (gastroesophageal reflux disease)   . Hyperlipidemia   . Hypertension   . Irritable bowel syndrome (IBS)   . Lung disorder    [place]  . Pinched nerve    back and left leg           Current Outpatient Prescriptions  Medication Sig Dispense Refill  . acetaminophen (TYLENOL) 500 MG  tablet Take 1,000 mg by mouth 2 (two) times daily.     Marland Kitchen aspirin EC 81 MG tablet Take 81 mg by mouth daily.    . Calcium Carbonate-Vitamin D (CALCIUM 600 + D PO) Take 600 mg by mouth daily.      . cetirizine (ZYRTEC) 10 MG tablet Take 10 mg by mouth daily.    Marland Kitchen docusate sodium (COLACE) 250 MG capsule Take 500 mg by mouth at bedtime.     . fish oil-omega-3 fatty acids 1000 MG capsule Take 1 g by mouth 2 (two) times daily.     Marland Kitchen gabapentin (NEURONTIN) 300 MG capsule Take 300 mg by mouth 3 (three) times daily. And takes 2 capsules HS    . Glucosamine-Chondroit-Vit C-Mn (GLUCOSAMINE CHONDR 1500 COMPLX PO) Take 1 tablet by mouth 2 (two) times daily.    . hyoscyamine (LEVBID) 0.375 MG 12 hr tablet Take 0.375 mg by mouth every 12 (twelve) hours as needed (IBS).     . Multiple Vitamin (MULTIVITAMIN) tablet Take 1 tablet by mouth daily.      . simvastatin (ZOCOR) 20 MG tablet Take 20 mg by mouth at bedtime.      . valsartan-hydrochlorothiazide (DIOVAN-HCT) 80-12.5 MG per tablet Take 1 tablet by mouth daily.  No current facility-administered medications for this visit.     Physical Exam BP (!) 106/58   Pulse 75   Resp 20   Ht 5\' 6"  (1.676 m)   Wt 145 lb (65.8 kg)   LMP  (LMP Unknown)   SpO2 96% Comment: RA  BMI 23.44 kg/m  76 year old woman in no distress Alert and oriented 3 with no focal neurologic deficits No cervical or subclavicular adenopathy Cardiac regular rate and rhythm with no murmur Lungs clear with equal breath sounds bilaterally Abdomen soft nontender Extremities without clubbing cyanosis or edema  Diagnostic Tests: I reviewed her CT from September 2017.  Impression: Tiffany Caldwell is a 76 year old woman with a cavitary right lung mass. She has had 2 CT-guided needle biopsies. Cultures on 1 showed MAI, they were negative on the second biopsy. There was no evidence of carcinoma seen on either biopsy. The MAI was never treated due to an allergy  to azithromycin.  Over time the nodule has gotten slightly bigger and the cavity has filled in. This is consistent with untreated MAI, but the question persists as to whether this could be malignant. I discussed 3 options with her. First would be continued radiographic follow-up. Second would be navigational bronchoscopy to biopsy and culture the lesion again. She understands that even if this is negative, it does not definitively rule out the possibility of cancer, although that would be 3 negatives in a row making it less likely. Finally another option would be to do a right upper lobectomy to definitively diagnose and treat the lesion once and for all.  Of the 3 options she favors navigational bronchoscopy to rebiopsy the lesion.  I discussed the general nature of the procedure with her. She understands this is strictly diagnostic and not therapeutic. She understands that due to the long delay between her most recent scan in the procedure that she needs to have another CT done to reassess the nodule. She understands risks include those associated with general anesthesia. Procedure specific risks including failure to make a diagnosis, pneumothorax, and bleeding.  We will plan to do the procedure on an outpatient basis.  Plan: CT chest was super D protocol  Navigational bronchoscopy on Thursday, 10/23/2016  [name], MD [facility] [phone]

## 2016-11-27 NOTE — Anesthesia Postprocedure Evaluation (Addendum)
Anesthesia Post Note  Patient: Tiffany Caldwell  Procedure(s) Performed: Procedure(s) (LRB): VIDEO BRONCHOSCOPY WITH ENDOBRONCHIAL NAVIGATION (N/A)  Patient location during evaluation: PACU Anesthesia Type: General Level of consciousness: awake and alert Pain management: pain level controlled Vital Signs Assessment: post-procedure vital signs reviewed and stable Respiratory status: spontaneous breathing, nonlabored ventilation, respiratory function stable and patient connected to nasal cannula oxygen Cardiovascular status: blood pressure returned to baseline and stable Postop Assessment: no signs of nausea or vomiting Anesthetic complications: no       Last Vitals:  Vitals:   11/27/16 1605 11/27/16 1618  BP: 119/67 111/85  Pulse: 73 73  Resp: 14 12  Temp: 36.5 C 36.5 C    Last Pain: There were no vitals filed for this visit.               Effie Berkshire

## 2016-11-27 NOTE — Brief Op Note (Signed)
11/27/2016  3:30 PM  PATIENT:  Tiffany Caldwell  76 y.o. female  PRE-OPERATIVE DIAGNOSIS:  RLL NODULE  POST-OPERATIVE DIAGNOSIS:  RLL NODULE  PROCEDURE:  Procedure(s): VIDEO BRONCHOSCOPY WITH ENDOBRONCHIAL NAVIGATION (N/A) Brushings and biopsies of Right lower lobe mass  SURGEON:  Surgeon(s) and Role:    * [name], MD - Primary  ANESTHESIA:   general  EBL:  Total I/O In: 500 [I.V.:500] Out: -   BLOOD ADMINISTERED:none  DRAINS: none   LOCAL MEDICATIONS USED:  NONE  SPECIMEN:  Source of Specimen:  RLL mass  DISPOSITION OF SPECIMEN:  Path and micro  PLAN OF CARE: Discharge to home after PACU  PATIENT DISPOSITION:  PACU - hemodynamically stable.   Delay start of Pharmacological VTE agent (>24hrs) due to surgical blood loss or risk of bleeding: not applicable  FINDINGS- Brushings showed inflammation

## 2016-11-27 NOTE — Op Note (Signed)
NAMECLORINE, Tiffany Caldwell NO.:  [id number]  MEDICAL RECORD NO.:  [id number]  LOCATION:                                 FACILITY:  PHYSICIAN:  Revonda Standard. Roxan Hockey, M.D.DATE OF BIRTH:  07-01-1941  DATE OF PROCEDURE:  11/27/2016 DATE OF DISCHARGE:                              OPERATIVE REPORT   PREOPERATIVE DIAGNOSIS:  Right lower lobe mass.  POSTOPERATIVE DIAGNOSIS:  Right lower lobe mass.  PROCEDURE:  Electromagnetic navigational bronchoscopy with brushings and biopsies of right lower lobe mass.  SURGEON:  Revonda Standard. Roxan Hockey, M.D.  ASSISTANT:  None.  ANESTHESIA:  General.  FINDINGS:  Navigated within a centimeter of the center of the mass with good alignment, multiple brushings and biopsies taken.  Initial brushings showed inflammation and possibly some necrosis.  CLINICAL NOTE:  [name] is a 76 year old woman who has been followed with a right lower lobe lung nodule since 2015.  CT-guided biopsy showed no cancer, but cultures grew Mycobacterium avium intracellulare.  She was not treated.  On followup CT, the wall the mass was thicker and a repeat CT-guided biopsy was done.  It was nondiagnostic.  Last fall, she had a repeat CT which showed the nodule continued to be larger in size and was referred for consideration for navigational bronchoscopy.  The indications, risks, benefits, and alternatives were discussed in detail with the patient.  The limitations of the study, particularly the inability to definitively rule out cancer were also discussed with the patient.  She understood and accepted the risks and agreed to proceed.  OPERATIVE NOTE:  [name] was brought to the operating room on November 27, 2016.  She had induction of general anesthesia, was intubated.  Flexible fiberoptic bronchoscopy was performed via the endotracheal tube.  It revealed normal endobronchial anatomy with no endobronchial lesions.  There were minimal secretions.  The  locatable guide for navigation then was placed and registration was performed. The locatable guide then was navigated to the superior segmental bronchus of the right lower lobe and the appropriate subsegmental bronchus was cannulated.  The locatable guide was advanced within 9 mm of the center of the lesion.  Brushings were performed and sent for quick prep.  While awaiting those results, multiple biopsies were done. Both the brushings and biopsies were done with fluoroscopic visualization.  The biopsies were divided into 2 separate containers and one was sent for AFB and fungal cultures and one was sent for permanent pathology.  The locatable guide was repositioned to a different area on the mass and the biopsies were repeated.  The quick prep returned showing inflammation and possibly some necrosis.  There was no malignancy seen.  A total of 20 biopsies were performed. The locatable guide and sheath were removed.  Final inspection revealed no bleeding from the superior segmental bronchus.  The patient was extubated in the operating room and taken to the postanesthetic care unit in good condition.     Revonda Standard Roxan Hockey, M.D.     SCH/MEDQ  D:  11/27/2016  T:  11/27/2016  Job:  [id number]

## 2016-11-27 NOTE — Anesthesia Preprocedure Evaluation (Addendum)
Anesthesia Evaluation  Patient identified by MRN, date of birth, ID band Patient awake    Reviewed: Allergy & Precautions, NPO status , Patient's Chart, lab work & pertinent test results  Airway Mallampati: II  TM Distance: >3 FB Neck ROM: Limited    Dental  (+) Teeth Intact, Dental Advisory Given   Pulmonary shortness of breath and with exertion, COPD, former smoker,    breath sounds clear to auscultation       Cardiovascular hypertension, Pt. on medications + CAD and + CABG   Rhythm:Regular Rate:Normal     Neuro/Psych negative neurological ROS  negative psych ROS   GI/Hepatic Neg liver ROS, GERD  Controlled,  Endo/Other  negative endocrine ROS  Renal/GU negative Renal ROS  negative genitourinary   Musculoskeletal  (+) Arthritis , Osteoarthritis,    Abdominal   Peds negative pediatric ROS (+)  Hematology negative hematology ROS (+)   Anesthesia Other Findings   Reproductive/Obstetrics negative OB ROS                           Lab Results  Component Value Date   WBC 3.6 (L) 11/20/2016   HGB 13.3 11/20/2016   HCT 41.6 11/20/2016   MCV 98.3 11/20/2016   PLT 149 (L) 11/20/2016   Lab Results  Component Value Date   CREATININE 1.14 (H) 11/20/2016   BUN 24 (H) 11/20/2016   NA 140 11/20/2016   K 4.8 11/20/2016   CL 98 (L) 11/20/2016   CO2 30 11/20/2016   Lab Results  Component Value Date   INR 1.06 11/20/2016   INR 1.04 07/28/2016   INR 1.08 02/18/2016   EKG: normal sinus rhythm.  Anesthesia Physical Anesthesia Plan  ASA: III  Anesthesia Plan: General   Post-op Pain Management:    Induction: Intravenous  Airway Management Planned: Oral ETT  Additional Equipment:   Intra-op Plan:   Post-operative Plan: Extubation in OR  Informed Consent: I have reviewed the patients History and Physical, chart, labs and discussed the procedure including the risks, benefits and  alternatives for the proposed anesthesia with the patient or authorized representative who has indicated his/her understanding and acceptance.   Dental advisory given  Plan Discussed with: CRNA, Anesthesiologist and Surgeon  Anesthesia Plan Comments:        Anesthesia Quick Evaluation

## 2016-11-27 NOTE — Anesthesia Procedure Notes (Addendum)
Procedure Name: Intubation Date/Time: 11/27/2016 2:04 PM Performed by: Carney Living Pre-anesthesia Checklist: Patient identified, Emergency Drugs available, Suction available, Patient being monitored and Timeout performed Patient Re-evaluated:Patient Re-evaluated prior to inductionOxygen Delivery Method: Circle system utilized Preoxygenation: Pre-oxygenation with 100% oxygen Intubation Type: IV induction Ventilation: Mask ventilation without difficulty Laryngoscope Size: Mac and 4 Grade View: Grade III Tube type: Oral Tube size: 8.0 mm Number of attempts: 1 Airway Equipment and Method: Stylet Placement Confirmation: positive ETCO2 and breath sounds checked- equal and bilateral Secured at: 20 cm Tube secured with: Tape Dental Injury: Teeth and Oropharynx as per pre-operative assessment

## 2016-11-27 NOTE — Interval H&P Note (Signed)
History and Physical Interval Note:   Presents for bronchoscopy. No interval change in history or physical Imaging reviewed The nodule to be biopsied is in the [place] C. Roxan Hockey, MD [facility] [phone]  11/27/2016 1:18 PM  Tiffany Caldwell  has presented today for surgery, with the diagnosis of RUL NODULE  The various methods of treatment have been discussed with the patient and family. After consideration of risks, benefits and other options for treatment, the patient has consented to  Procedure(s): [place] (N/A) as a surgical intervention .  The patient's history has been reviewed, patient examined, no change in status, stable for surgery.  I have reviewed the patient's chart and labs.  Questions were answered to the patient's satisfaction.     Tiffany Caldwell

## 2016-11-27 NOTE — Discharge Instructions (Signed)
Do not drive or engage in heavy physical activity for 24 hours  You may resume normal activities tomorrow  You may cough up small amounts of blood over the next few days  You may use over the counter pain relievers (Tylenol) or cough medication as needed  Call [phone] if you develop chest pain, shortness of breath, fever > 101 F or cough up more than 2 tablespoons of blood  Our office will contact you to follow up with Dr. Cyndia Bent next week

## 2016-11-28 ENCOUNTER — Encounter (HOSPITAL_COMMUNITY): Payer: Self-pay | Admitting: Thoracic Surgery (Cardiothoracic Vascular Surgery)

## 2016-11-28 DIAGNOSIS — R848 Other abnormal findings in specimens from respiratory organs and thorax: Secondary | ICD-10-CM | POA: Diagnosis not present

## 2016-11-28 LAB — ACID FAST SMEAR (AFB): ACID FAST SMEAR - AFSCU2: NEGATIVE

## 2016-12-03 ENCOUNTER — Ambulatory Visit (INDEPENDENT_AMBULATORY_CARE_PROVIDER_SITE_OTHER): Payer: PPO | Admitting: Surgery

## 2016-12-03 ENCOUNTER — Encounter: Payer: Self-pay | Admitting: Surgery

## 2016-12-03 VITALS — BP 115/62 | HR 66 | Resp 20 | Ht 66.0 in | Wt 139.0 lb

## 2016-12-03 DIAGNOSIS — R911 Solitary pulmonary nodule: Secondary | ICD-10-CM | POA: Diagnosis not present

## 2016-12-03 DIAGNOSIS — Z853 Personal history of malignant neoplasm of breast: Secondary | ICD-10-CM | POA: Diagnosis not present

## 2016-12-03 NOTE — Progress Notes (Signed)
HPI:  The patient returns today for follow up after a recent ENB with brushings and biopsies of a persistent RLL cavitary lesion. It had been biopsied twice and showed no malignancy. The cultures from the initial biopsy did grow MAI but ID decided not to treat her since she was asymptomatic and was allergic to Macrolide antibiotics. A CT on 12/18/2015  showed increased wall thickness throughout the cavitary lung mass with new nodular peribronchovascular interstitial thickening extending from the mass to the right hilum with no change in the overall size of the lesion. She  continued to feel well with no cough, fever or chills and had normal appetite and no weight loss. We decided to biopsy this again with ENB which was performed by Dr. Roxan Hockey on 11/27/2016. The delay in doing this was because of a right THR done on 10/31 and the subsequent recovery.  The biopsies showed no malignancy, only benign lung with cell necrosis, inflammation and calcifications. The cytology was negative for malignancy. The stains showed no fungus or AFB and the cultures are being held for 6 weeks but negative so far. She still feels fine.  Current Outpatient Prescriptions  Medication Sig Dispense Refill  . acetaminophen (TYLENOL) 500 MG tablet Take 1,000 mg by mouth 3 (three) times daily.     Marland Kitchen aspirin EC 81 MG tablet Take 81 mg by mouth daily.    . Calcium Carbonate-Vitamin D (CALCIUM 600 + D PO) Take 1 tablet by mouth at bedtime.     . Carboxymethylcell-Hypromellose (GENTEAL OP) Place 1 drop into both eyes at bedtime.    . cetirizine (ZYRTEC) 10 MG tablet Take 10 mg by mouth daily.    Marland Kitchen docusate sodium (COLACE) 100 MG capsule Take 200 mg by mouth at bedtime.    . fish oil-omega-3 fatty acids 1000 MG capsule Take 1 g by mouth 2 (two) times daily.     Marland Kitchen gabapentin (NEURONTIN) 300 MG capsule Take 300 mg by mouth 3 (three) times daily.     . Glucosamine-Chondroit-Vit C-Mn (GLUCOSAMINE CHONDR 1500 COMPLX PO) Take 1  tablet by mouth 2 (two) times daily.    . hyoscyamine (LEVBID) 0.375 MG 12 hr tablet Take 0.375 mg by mouth every 12 (twelve) hours as needed for cramping (IBS).     . Multiple Vitamin (MULTIVITAMIN) tablet Take 1 tablet by mouth daily.      . simvastatin (ZOCOR) 20 MG tablet Take 20 mg by mouth at bedtime.      . valsartan-hydrochlorothiazide (DIOVAN-HCT) 80-12.5 MG per tablet Take 1 tablet by mouth daily.       No current facility-administered medications for this visit.      Physical Exam: BP 115/62   Pulse 66   Resp 20   Ht 5\' 6"  (1.676 m)   Wt 139 lb (63 kg)   LMP  (LMP Unknown)   SpO2 97% Comment: RA  BMI 22.44 kg/m  She looks well Lungs are clear  Diagnostic Tests:  None today  Impression:  This lesion has been biopsies three times and all have shown no malignancy. The only abnormality that has been identified has been MAI from cultures of the initial biopsy. This lesion has developed thicker walls but the overall size is stable. I think it is most likely inflammatory or infectious. There is nothing else to do at this time. I would not resect this unless it was malignancy due to her COPD and heart disease. I discussed the results with her  and answered her questions.  Plan:  I will see her back in six months with a CT chest.   Gaye Pollack, MD [facility] [phone]

## 2016-12-09 ENCOUNTER — Encounter (HOSPITAL_COMMUNITY): Payer: Self-pay | Admitting: Thoracic Surgery (Cardiothoracic Vascular Surgery)

## 2016-12-09 NOTE — Addendum Note (Signed)
Addendum  created 12/09/16 1008 by Effie Berkshire, MD   Anesthesia Event edited, Anesthesia Staff edited

## 2016-12-25 LAB — FUNGAL ORGANISM REFLEX

## 2016-12-25 LAB — FUNGUS CULTURE WITH STAIN

## 2016-12-25 LAB — FUNGUS CULTURE RESULT

## 2016-12-31 DIAGNOSIS — M5441 Lumbago with sciatica, right side: Secondary | ICD-10-CM | POA: Diagnosis not present

## 2017-01-05 DIAGNOSIS — M5416 Radiculopathy, lumbar region: Secondary | ICD-10-CM | POA: Diagnosis not present

## 2017-01-09 LAB — ACID FAST CULTURE WITH REFLEXED SENSITIVITIES (MYCOBACTERIA): Acid Fast Culture: NEGATIVE

## 2017-01-13 DIAGNOSIS — M5416 Radiculopathy, lumbar region: Secondary | ICD-10-CM | POA: Diagnosis not present

## 2017-02-03 DIAGNOSIS — M5416 Radiculopathy, lumbar region: Secondary | ICD-10-CM | POA: Diagnosis not present

## 2017-02-11 DIAGNOSIS — H02105 Unspecified ectropion of left lower eyelid: Secondary | ICD-10-CM | POA: Diagnosis not present

## 2017-02-11 DIAGNOSIS — Z961 Presence of intraocular lens: Secondary | ICD-10-CM | POA: Diagnosis not present

## 2017-02-11 DIAGNOSIS — H5213 Myopia, bilateral: Secondary | ICD-10-CM | POA: Diagnosis not present

## 2017-02-17 DIAGNOSIS — M5416 Radiculopathy, lumbar region: Secondary | ICD-10-CM | POA: Diagnosis not present

## 2017-02-23 DIAGNOSIS — I788 Other diseases of capillaries: Secondary | ICD-10-CM | POA: Diagnosis not present

## 2017-02-23 DIAGNOSIS — L821 Other seborrheic keratosis: Secondary | ICD-10-CM | POA: Diagnosis not present

## 2017-03-06 NOTE — Addendum Note (Signed)
Addendum  created 03/06/17 1037 by Effie Berkshire, MD   Sign clinical note

## 2017-03-11 DIAGNOSIS — M5416 Radiculopathy, lumbar region: Secondary | ICD-10-CM | POA: Diagnosis not present

## 2017-04-22 ENCOUNTER — Other Ambulatory Visit: Payer: Self-pay | Admitting: *Deleted

## 2017-04-22 DIAGNOSIS — R911 Solitary pulmonary nodule: Secondary | ICD-10-CM

## 2017-04-23 ENCOUNTER — Other Ambulatory Visit: Payer: Self-pay | Admitting: *Deleted

## 2017-04-23 DIAGNOSIS — R911 Solitary pulmonary nodule: Secondary | ICD-10-CM

## 2017-04-29 DIAGNOSIS — M4312 Spondylolisthesis, cervical region: Secondary | ICD-10-CM | POA: Diagnosis not present

## 2017-05-07 DIAGNOSIS — M25512 Pain in left shoulder: Secondary | ICD-10-CM | POA: Diagnosis not present

## 2017-05-16 DIAGNOSIS — M4312 Spondylolisthesis, cervical region: Secondary | ICD-10-CM | POA: Diagnosis not present

## 2017-05-18 DIAGNOSIS — Z1231 Encounter for screening mammogram for malignant neoplasm of breast: Secondary | ICD-10-CM | POA: Diagnosis not present

## 2017-05-18 DIAGNOSIS — Z853 Personal history of malignant neoplasm of breast: Secondary | ICD-10-CM | POA: Diagnosis not present

## 2017-05-27 ENCOUNTER — Ambulatory Visit
Admission: RE | Admit: 2017-05-27 | Discharge: 2017-05-27 | Disposition: A | Discharge status: Home / Self Care | Payer: PPO | Source: Ambulatory Visit | Attending: Surgery | Admitting: Surgery

## 2017-05-27 ENCOUNTER — Ambulatory Visit: Payer: PPO | Admitting: Surgery

## 2017-05-27 DIAGNOSIS — R911 Solitary pulmonary nodule: Secondary | ICD-10-CM

## 2017-06-02 DIAGNOSIS — M542 Cervicalgia: Secondary | ICD-10-CM | POA: Diagnosis not present

## 2017-06-04 DIAGNOSIS — E210 Primary hyperparathyroidism: Secondary | ICD-10-CM | POA: Diagnosis not present

## 2017-06-04 DIAGNOSIS — Z0000 Encounter for general adult medical examination without abnormal findings: Secondary | ICD-10-CM | POA: Diagnosis not present

## 2017-06-04 DIAGNOSIS — I10 Essential (primary) hypertension: Secondary | ICD-10-CM | POA: Diagnosis not present

## 2017-06-04 DIAGNOSIS — J449 Chronic obstructive pulmonary disease, unspecified: Secondary | ICD-10-CM | POA: Diagnosis not present

## 2017-06-04 DIAGNOSIS — R911 Solitary pulmonary nodule: Secondary | ICD-10-CM | POA: Diagnosis not present

## 2017-06-04 DIAGNOSIS — I25810 Atherosclerosis of coronary artery bypass graft(s) without angina pectoris: Secondary | ICD-10-CM | POA: Diagnosis not present

## 2017-06-04 DIAGNOSIS — Z1389 Encounter for screening for other disorder: Secondary | ICD-10-CM | POA: Diagnosis not present

## 2017-06-04 DIAGNOSIS — M5030 Other cervical disc degeneration, unspecified cervical region: Secondary | ICD-10-CM | POA: Diagnosis not present

## 2017-06-09 DIAGNOSIS — M542 Cervicalgia: Secondary | ICD-10-CM | POA: Diagnosis not present

## 2017-06-10 ENCOUNTER — Encounter: Payer: Self-pay | Admitting: Surgery

## 2017-06-10 ENCOUNTER — Ambulatory Visit (INDEPENDENT_AMBULATORY_CARE_PROVIDER_SITE_OTHER): Payer: PPO | Admitting: Surgery

## 2017-06-10 VITALS — BP 110/58 | HR 64 | Resp 20 | Ht 66.0 in | Wt 142.0 lb

## 2017-06-10 DIAGNOSIS — Z853 Personal history of malignant neoplasm of breast: Secondary | ICD-10-CM

## 2017-06-10 DIAGNOSIS — R918 Other nonspecific abnormal finding of lung field: Secondary | ICD-10-CM | POA: Diagnosis not present

## 2017-06-11 DIAGNOSIS — M542 Cervicalgia: Secondary | ICD-10-CM | POA: Diagnosis not present

## 2017-06-13 ENCOUNTER — Encounter: Payer: Self-pay | Admitting: Surgery

## 2017-06-13 NOTE — Progress Notes (Signed)
HPI:  The patient returns today for follow up of a persistent right lower lobe lung cavitary lesion.  It had been biopsied twice and showed no malignancy. The cultures from the initial biopsy did grow MAI but ID decided not to treat her since she was asymptomatic and was allergic to Macrolide antibiotics. A CT on 12/18/2015 showed increased wall thickness throughout the cavitary lung mass with new nodular peribronchovascular interstitial thickening extending from the mass to the right hilum with no change in the overall size of the lesion. She underwent ENB with brushings and biopsies by Dr. Roxan Hockey in 11/2016 and these were all negative for malignancy. The biopsies showed no malignancy, only benign lung with cell necrosis, inflammation and calcifications. The cytology was negative for malignancy. The stains showed no fungus or AFB and the cultures were negative. She continues to feel well with no cough, sputum, shortness of breath, fever or chills and has normal appetite and stable weight.  Current Outpatient Prescriptions  Medication Sig Dispense Refill  . acetaminophen (TYLENOL) 500 MG tablet Take 1,000 mg by mouth 3 (three) times daily.     Marland Kitchen aspirin EC 81 MG tablet Take 81 mg by mouth daily.    . Calcium Carbonate-Vitamin D (CALCIUM 600 + D PO) Take 1 tablet by mouth at bedtime.     . Carboxymethylcell-Hypromellose (GENTEAL OP) Place 1 drop into both eyes at bedtime.    . cetirizine (ZYRTEC) 10 MG tablet Take 10 mg by mouth daily.    Marland Kitchen docusate sodium (COLACE) 100 MG capsule Take 200 mg by mouth at bedtime.    . fish oil-omega-3 fatty acids 1000 MG capsule Take 1 g by mouth 2 (two) times daily.     Marland Kitchen gabapentin (NEURONTIN) 300 MG capsule Take 300 mg by mouth 3 (three) times daily.     . Glucosamine-Chondroit-Vit C-Mn (GLUCOSAMINE CHONDR 1500 COMPLX PO) Take 1 tablet by mouth 2 (two) times daily.    . hyoscyamine (LEVBID) 0.375 MG 12 hr tablet Take 0.375 mg by mouth every 12 (twelve)  hours as needed for cramping (IBS).     . Multiple Vitamin (MULTIVITAMIN) tablet Take 1 tablet by mouth daily.      . simvastatin (ZOCOR) 20 MG tablet Take 20 mg by mouth at bedtime.      . valsartan-hydrochlorothiazide (DIOVAN-HCT) 80-12.5 MG per tablet Take 1 tablet by mouth daily.       No current facility-administered medications for this visit.      Physical Exam: BP (!) 110/58   Pulse 64   Resp 20   Ht 5\' 6"  (1.676 m)   Wt 142 lb (64.4 kg)   LMP  (LMP Unknown)   SpO2 95% Comment: RA  BMI 22.92 kg/m  She looks well Lungs are clear  Diagnostic Tests:  CLINICAL DATA:  FOLLOWUP LUNG MASS.  EXAM: CT CHEST WITHOUT CONTRAST  TECHNIQUE: Multidetector CT imaging of the chest was performed following the standard protocol without IV contrast.  COMPARISON:  10/21/2016  FINDINGS: Cardiovascular: The heart size appears within normal limits. No pericardial effusion. Previous median sternotomy and CABG procedure. Aortic atherosclerosis noted.  Mediastinum/Nodes: No enlarged mediastinal or axillary lymph nodes. Thyroid gland, trachea, and esophagus demonstrate no significant findings.  Lungs/Pleura: Advanced changes of centrilobular and paraseptal emphysema identified. Thick walled cavitary structure within the superior segment of right lower lobe 4.1 x 3.5 cm, image 42 of series 4. Unchanged from previous exam. Adjacent solid nodule within the right lower lobe  is also unchanged measuring 1.2 cm. New subpleural nodule in the lateral right lower lobe measures 7 mm, image 33 of series 4. Nodular area of architectural distortion within the anterolateral right upper lobe is stable, image 29 of series 4.  Upper Abdomen: No acute abnormality.  Musculoskeletal: Degenerative disc disease is identified within the thoracic spine. No aggressive lytic or sclerotic bone lesions.  IMPRESSION: 1. Stable size of thick walled cavitary lesion in the right lower lobe.  Differential considerations include infectious and inflammatory etiologies, include atypical nontuberculous mycobacterium infection. Given the recent biopsy results malignancy is considered less favored. 2. New subpleural nodule within the right upper lobe measures 7 mm and is nonspecific. Non-contrast chest CT at 6-12 months is recommended. If the nodule is stable at time of repeat CT, then future CT at 18-24 months (from today's scan) is considered optional for low-risk patients, but is recommended for high-risk patients. This recommendation follows the consensus statement: Guidelines for Management of Incidental Pulmonary Nodules Detected on CT Images: From the Fleischner Society 2017; Radiology 2017; 284:228-243. 3. Aortic Atherosclerosis (ICD10-I70.0) and Emphysema (ICD10-J43.9).   Electronically Signed   By: Kerby Moors M.D.   On: 05/27/2017 14:39  Impression:  The previously seen cavitary lesion in the RLL is stable. There is a new 7 mm subpleural nodule in the RUL that I suspect is probably inflammatory or infectious given her history and the other abnormalities in her lungs but will require continued follow up.   Plan:  I will see her back in one year with a CT of the chest.  I spent 15 minutes performing this established patient evaluation and > 50% of this time was spent face to face counseling and coordinating the care of this patient's lung nodules.    Gaye Pollack, MD [facility] [phone]

## 2017-06-23 DIAGNOSIS — M542 Cervicalgia: Secondary | ICD-10-CM | POA: Diagnosis not present

## 2017-06-29 DIAGNOSIS — M79642 Pain in left hand: Secondary | ICD-10-CM | POA: Diagnosis not present

## 2017-06-29 DIAGNOSIS — M542 Cervicalgia: Secondary | ICD-10-CM | POA: Diagnosis not present

## 2017-06-29 DIAGNOSIS — M180 Bilateral primary osteoarthritis of first carpometacarpal joints: Secondary | ICD-10-CM | POA: Diagnosis not present

## 2017-06-29 DIAGNOSIS — M79641 Pain in right hand: Secondary | ICD-10-CM | POA: Diagnosis not present

## 2017-07-07 DIAGNOSIS — M542 Cervicalgia: Secondary | ICD-10-CM | POA: Diagnosis not present

## 2017-07-09 DIAGNOSIS — M542 Cervicalgia: Secondary | ICD-10-CM | POA: Diagnosis not present

## 2017-07-10 DIAGNOSIS — S82034A Nondisplaced transverse fracture of right patella, initial encounter for closed fracture: Secondary | ICD-10-CM | POA: Diagnosis not present

## 2017-07-17 DIAGNOSIS — S82034D Nondisplaced transverse fracture of right patella, subsequent encounter for closed fracture with routine healing: Secondary | ICD-10-CM | POA: Diagnosis not present

## 2017-08-03 DIAGNOSIS — S82034D Nondisplaced transverse fracture of right patella, subsequent encounter for closed fracture with routine healing: Secondary | ICD-10-CM | POA: Diagnosis not present

## 2017-08-11 DIAGNOSIS — M542 Cervicalgia: Secondary | ICD-10-CM | POA: Diagnosis not present

## 2017-08-13 DIAGNOSIS — M542 Cervicalgia: Secondary | ICD-10-CM | POA: Diagnosis not present

## 2017-08-20 DIAGNOSIS — M542 Cervicalgia: Secondary | ICD-10-CM | POA: Diagnosis not present

## 2017-08-25 DIAGNOSIS — J984 Other disorders of lung: Secondary | ICD-10-CM | POA: Insufficient documentation

## 2017-08-25 DIAGNOSIS — K219 Gastro-esophageal reflux disease without esophagitis: Secondary | ICD-10-CM | POA: Insufficient documentation

## 2017-08-25 DIAGNOSIS — K589 Irritable bowel syndrome without diarrhea: Secondary | ICD-10-CM | POA: Insufficient documentation

## 2017-08-25 DIAGNOSIS — J439 Emphysema, unspecified: Secondary | ICD-10-CM | POA: Insufficient documentation

## 2017-08-25 DIAGNOSIS — G589 Mononeuropathy, unspecified: Secondary | ICD-10-CM | POA: Insufficient documentation

## 2017-08-25 DIAGNOSIS — R0609 Other forms of dyspnea: Secondary | ICD-10-CM | POA: Insufficient documentation

## 2017-08-25 DIAGNOSIS — R0600 Dyspnea, unspecified: Secondary | ICD-10-CM | POA: Insufficient documentation

## 2017-08-25 DIAGNOSIS — R0602 Shortness of breath: Secondary | ICD-10-CM | POA: Insufficient documentation

## 2017-08-25 DIAGNOSIS — T7840XA Allergy, unspecified, initial encounter: Secondary | ICD-10-CM | POA: Insufficient documentation

## 2017-08-25 DIAGNOSIS — M542 Cervicalgia: Secondary | ICD-10-CM | POA: Diagnosis not present

## 2017-08-25 DIAGNOSIS — M1990 Unspecified osteoarthritis, unspecified site: Secondary | ICD-10-CM | POA: Insufficient documentation

## 2017-08-25 DIAGNOSIS — E785 Hyperlipidemia, unspecified: Secondary | ICD-10-CM | POA: Insufficient documentation

## 2017-08-25 DIAGNOSIS — M5137 Other intervertebral disc degeneration, lumbosacral region: Secondary | ICD-10-CM | POA: Insufficient documentation

## 2017-08-25 DIAGNOSIS — C50919 Malignant neoplasm of unspecified site of unspecified female breast: Secondary | ICD-10-CM | POA: Insufficient documentation

## 2017-08-25 DIAGNOSIS — G629 Polyneuropathy, unspecified: Secondary | ICD-10-CM | POA: Insufficient documentation

## 2017-09-01 DIAGNOSIS — M542 Cervicalgia: Secondary | ICD-10-CM | POA: Diagnosis not present

## 2017-09-02 DIAGNOSIS — M180 Bilateral primary osteoarthritis of first carpometacarpal joints: Secondary | ICD-10-CM | POA: Diagnosis not present

## 2017-09-02 DIAGNOSIS — Z96641 Presence of right artificial hip joint: Secondary | ICD-10-CM | POA: Diagnosis not present

## 2017-09-02 DIAGNOSIS — S82034D Nondisplaced transverse fracture of right patella, subsequent encounter for closed fracture with routine healing: Secondary | ICD-10-CM | POA: Diagnosis not present

## 2017-09-03 DIAGNOSIS — M542 Cervicalgia: Secondary | ICD-10-CM | POA: Diagnosis not present

## 2017-09-07 ENCOUNTER — Encounter: Payer: Self-pay | Admitting: Internal Medicine

## 2017-09-07 ENCOUNTER — Ambulatory Visit: Payer: PPO | Admitting: Internal Medicine

## 2017-09-07 VITALS — BP 134/66 | HR 66 | Ht 66.0 in | Wt 144.8 lb

## 2017-09-07 DIAGNOSIS — E785 Hyperlipidemia, unspecified: Secondary | ICD-10-CM

## 2017-09-07 DIAGNOSIS — I10 Essential (primary) hypertension: Secondary | ICD-10-CM

## 2017-09-07 DIAGNOSIS — I2510 Atherosclerotic heart disease of native coronary artery without angina pectoris: Secondary | ICD-10-CM | POA: Diagnosis not present

## 2017-09-07 MED ORDER — ATORVASTATIN CALCIUM 20 MG PO TABS
20.0000 mg | ORAL_TABLET | Freq: Every day | ORAL | 1 refills | Status: DC
Start: 2017-09-07 — End: 2018-03-09

## 2017-09-07 NOTE — Progress Notes (Signed)
Follow-up Outpatient Visit Date: 09/07/2017  Primary Care Provider: Lavone Orn, MD [place] [address] [place] [address]  Chief Complaint: Follow-up coronary artery disease  HPI:  Tiffany Caldwell is a 76 y.o. year-old female with history of coronary artery disease status post remote PCI and subsequent emergent CABG for iatrogenic LMCA/LAD dissection in 2001, hypertension, hyperlipidemia, and right lower lobe cavitary lung mass, who presents for follow-up of coronary artery disease. I last saw her in 07/2016 for preoperative risk assessment in anticipation of right total hip arthroplasty. She was doing well at that time from a heart standpoint.  Tiffany Caldwell has recovered well from her hip surgery. She tripped over the threshold going from her bathroom to bedroom a few months ago and fractured her right patella. She is gradually increasing her walking again. She has not had any chest pain. She notes stable exertional dyspnea if she "overexerts herself." She is able to climb at least 13 steps in her home without any difficulty. She denies edema, orthopnea, and PND. She has sporadic palpitations that she describes as a "skipped beat" without accompanying symptoms like chest pain or shortness of breath. She is tolerating her medications well and knows that her blood pressure at home has been well-controlled with systolic readings usually between 115-125 mmHg.  --------------------------------------------------------------------------------------------------  Cardiovascular History & Procedures: Cardiovascular Problems:  Coronary artery disease status post PCI and subsequent CABG (iatrogenic LMCA dissection)  Risk Factors:  Known coronary artery disease, hypertension, hyperlipidemia, and age  Cath/PCI:  LHC/PCI (12/16/99): LMCA normal. LAD with 80 and 90% mid vessel stenosis. LCx without significant disease. RCA with 30% proximal stenosis. Successful PCI to mid LAD with a NIR  Royale 3.0 x 15 mm bare-metal stent.  LHC/PCI (05/28/00): LMCA normal. LAD with 80-90% mid vessel in-stent restenosis. LCx normal. RCA with 30-40% proximal as well as ostial spasm. Plan angioplasty of previous stent complicated by proximal LAD dissection with placement of NIR Elite 3.5 x 12 mm bare-metal stent. Further propagation of the dissection to the LMCA occurred, prompting referral for urgent CABG.  CV Surgery:  Urgent CABG (05/28/00): LIMA to LAD, SVG to diagonal, SVG to OM.  EP Procedures and Devices:  None  Non-Invasive Evaluation(s):  TTE (11/08/14): Normal LV size with mild concentric LVH. LVEF 55-60% without regional wall motion abnormalities. Grade 1 diastolic dysfunction. Left atrium is severely dilated. RV size is normal with normal contraction. Mild TR. Otherwise no significant valvular abnormalities.  Recent CV Pertinent Labs: Lab Results  Component Value Date   INR 1.06 11/20/2016   K 4.8 11/20/2016   BUN 24 (H) 11/20/2016   CREATININE 1.14 (H) 11/20/2016    Past medical and surgical history were reviewed and updated in EPIC.  Current Meds  Medication Sig  . acetaminophen (TYLENOL) 500 MG tablet Take 1,000 mg by mouth 3 (three) times daily.   Marland Kitchen aspirin EC 81 MG tablet Take 81 mg by mouth daily.  . Calcium Carbonate-Vitamin D (CALCIUM 600 + D PO) Take 1 tablet by mouth at bedtime.   . Carboxymethylcell-Hypromellose (GENTEAL OP) Place 1 drop into both eyes at bedtime.  . cetirizine (ZYRTEC) 10 MG tablet Take 10 mg by mouth daily.  Marland Kitchen docusate sodium (COLACE) 100 MG capsule Take 200 mg by mouth at bedtime.  . fish oil-omega-3 fatty acids 1000 MG capsule Take 1 g by mouth 2 (two) times daily.   Marland Kitchen gabapentin (NEURONTIN) 300 MG capsule Take 300 mg by mouth 3 (three) times daily.   Marland Kitchen  Glucosamine-Chondroit-Vit C-Mn (GLUCOSAMINE CHONDR 1500 COMPLX PO) Take 1 tablet by mouth 2 (two) times daily.  . hyoscyamine (LEVBID) 0.375 MG 12 hr tablet Take 0.375 mg by mouth every 12  (twelve) hours as needed for cramping (IBS).   . Multiple Vitamin (MULTIVITAMIN) tablet Take 1 tablet by mouth daily.    . simvastatin (ZOCOR) 20 MG tablet Take 20 mg by mouth at bedtime.    . valsartan-hydrochlorothiazide (DIOVAN-HCT) 80-12.5 MG per tablet Take 1 tablet by mouth daily.      Allergies: Hydromorphone; Azithromycin; Ciprofloxacin; Hydromorphone hcl; Levofloxacin; and Lisinopril  Social History   Socioeconomic History  . Marital status: Married    Spouse name: Not on file  . Number of children: Not on file  . Years of education: Not on file  . Highest education level: Not on file  Social Needs  . Financial resource strain: Not on file  . Food insecurity - worry: Not on file  . Food insecurity - inability: Not on file  . Transportation needs - medical: Not on file  . Transportation needs - non-medical: Not on file  Occupational History  . Not on file  Tobacco Use  . Smoking status: Former Smoker    Years: 30.00    Types: Cigarettes    Last attempt to quit: 07/31/1989    Years since quitting: 28.1  . Smokeless tobacco: Never Used  Substance and Sexual Activity  . Alcohol use: Yes    Alcohol/week: 0.6 oz    Types: 1 Glasses of wine per week    Comment: occasional wine  . Drug use: No  . Sexual activity: Not on file  Other Topics Concern  . Not on file  Social History Narrative  . Not on file    Family History  Problem Relation Age of Onset  . Emphysema Father        deceased  . Heart disease Mother        deceased  . Colon cancer Paternal Grandmother   . Colon cancer Son   . Colon cancer Paternal Uncle     Review of Systems: Patient notes bilateral shoulder pain that is improving with physical therapy for her kyphosis. Otherwise, a 12-system review of systems was performed and was negative except as noted in the HPI.  --------------------------------------------------------------------------------------------------  Physical Exam: BP 140/82    Pulse 66   Ht 5\' 6"  (1.676 m)   Wt 144 lb 12.8 oz (65.7 kg)   LMP  (LMP Unknown)   BMI 23.37 kg/m   Repeat BP: 134/66  General:  Well-developed, well-nourished woman, seated comfortably in the exam room. HEENT: No conjunctival pallor or scleral icterus. Moist mucous membranes.  OP clear. Neck: Supple without lymphadenopathy, thyromegaly, JVD, or HJR. Lungs: Normal work of breathing. Clear to auscultation bilaterally without wheezes or crackles. Heart: Regular rate and rhythm with occasional extrasystoles. No murmurs, rubs, or gallops. Non-displaced PMI. Abd: Bowel sounds present. Soft, NT/ND without hepatosplenomegaly Ext: No lower extremity edema. Radial, PT, and DP pulses are 2+ bilaterally. Skin: Warm and dry without rash.  EKG:  Normal sinus rhythm with PACs, LVH and abnormal repolarization, and poor R-wave progression in V1 and V2. No significant change from prior tracings.  Lab Results  Component Value Date   WBC 3.6 (L) 11/20/2016   HGB 13.3 11/20/2016   HCT 41.6 11/20/2016   MCV 98.3 11/20/2016   PLT 149 (L) 11/20/2016    Lab Results  Component Value Date   NA 140 11/20/2016  K 4.8 11/20/2016   CL 98 (L) 11/20/2016   CO2 30 11/20/2016   BUN 24 (H) 11/20/2016   CREATININE 1.14 (H) 11/20/2016   GLUCOSE 111 (H) 11/20/2016   ALT 21 11/20/2016     Outside lipid panel (06/04/17): Total cholesterol 171, HDL 65, LDL 80, triglycerides 133  --------------------------------------------------------------------------------------------------  ASSESSMENT AND PLAN: Coronary artery disease without angina No symptoms to suggest worsening coronary insufficiency. We will continue secondary prevention with aspirin and statin therapy (see details below). Patient is not available due to low resting heart rate.  Hyperlipidemia Most recent LDL in August was 80 (goal less than 70). We have agreed to switch simvastatin to atorvastatin 20 mg daily. We will recheck a fasting lipid panel  and ALT in about 3 months.  Hypertension Blood pressure is upper normal to mildly elevated today. However, Tiffany Caldwell reports normal readings at home. We will continue her current dose of valsartan-HCTZ.  Follow-up: Return to clinic in 1 year.  [name], MD 09/07/2017 1:24 PM

## 2017-09-07 NOTE — Patient Instructions (Addendum)
Medication Instructions:  Stop simvastatin.  After you have been off simvastatin for 3 days, start atorvastatin (Lipitor) 20 mg daily.  Labwork: Your physician recommends that you return for a FASTING lipid profile/ALT in about 3 months.   Testing/Procedures: None   Follow-Up: Your physician wants you to follow-up in: 1 year with Dr End. (December 2019).  You will receive a reminder letter in the mail two months in advance. If you don't receive a letter, please call our office to schedule the follow-up appointment.   Any Other Special Instructions Will Be Listed Below (If Applicable).     If you need a refill on your cardiac medications before your next appointment, please call your pharmacy.

## 2017-09-08 DIAGNOSIS — M542 Cervicalgia: Secondary | ICD-10-CM | POA: Diagnosis not present

## 2017-09-17 DIAGNOSIS — M542 Cervicalgia: Secondary | ICD-10-CM | POA: Diagnosis not present

## 2017-09-21 DIAGNOSIS — S82034D Nondisplaced transverse fracture of right patella, subsequent encounter for closed fracture with routine healing: Secondary | ICD-10-CM | POA: Diagnosis not present

## 2017-09-22 DIAGNOSIS — M542 Cervicalgia: Secondary | ICD-10-CM | POA: Diagnosis not present

## 2017-09-23 DIAGNOSIS — M79641 Pain in right hand: Secondary | ICD-10-CM | POA: Diagnosis not present

## 2017-09-23 DIAGNOSIS — M79642 Pain in left hand: Secondary | ICD-10-CM | POA: Diagnosis not present

## 2017-09-24 ENCOUNTER — Other Ambulatory Visit: Payer: Self-pay | Admitting: Orthopedic Surgery

## 2017-09-25 ENCOUNTER — Telehealth: Payer: Self-pay | Admitting: *Deleted

## 2017-09-25 NOTE — Telephone Encounter (Signed)
   [facility] Pre-operative Risk Assessment    Request for surgical clearance:  1. What type of surgery is being performed? Suspension right thumb excision trapezium abductor pollicis longus transfer   2. When is this surgery scheduled? 11-12-2017   3. Are there any medications that need to be held prior to surgery and how long? N/A   4. Practice name and name of physician performing surgery? [facility] Clear[facility] Dr. Fredna Dow   5. What is your office phone and fax number? Phone: [phone]  Fax 336 [phone] Attn: Odessa Fleming  6. Anesthesia type (None, local, MAC, general) ? Axillary Block   Jalayna Josten M 09/25/2017, 4:05 PM  _________________________________________________________________   (provider comments below)

## 2017-09-30 NOTE — Telephone Encounter (Signed)
   Primary Cardiologist: [name], MD  Chart reviewed as part of pre-operative protocol coverage. Patient was contacted 09/30/2017 in reference to pre-operative risk assessment for pending surgery as outlined below.  Tiffany Caldwell was last seen on 09/07/2017 by Dr. Saunders Revel.  Since that day, Tiffany Caldwell has done well. She is able to complete 4 METS without any issue.  Therefore, based on ACC/AHA guidelines, the patient would be at acceptable risk for the planned procedure without further cardiovascular testing.   I will route this recommendation to the requesting party via Epic fax function and remove from pre-op pool.  Please call with questions.  Almyra Deforest, [place] 09/30/2017, 3:31 PM

## 2017-09-30 NOTE — Telephone Encounter (Signed)
Faxed to requesting provider.

## 2017-09-30 NOTE — Telephone Encounter (Signed)
Patient was just recently seen by Dr. Saunders Revel, she was stable at the time. I have called the patient and left her a message for her to call us back since there was no clear clearance given during last office visit. As long as there is no significant change, I think she can be cleared. She is on ASA but not plavix.   Hilbert Corrigan PA Pager: [phone]

## 2017-10-08 ENCOUNTER — Encounter: Payer: Self-pay | Admitting: Gastroenterology

## 2017-10-08 DIAGNOSIS — M542 Cervicalgia: Secondary | ICD-10-CM | POA: Diagnosis not present

## 2017-10-14 ENCOUNTER — Telehealth: Payer: Self-pay | Admitting: Gastroenterology

## 2017-10-14 NOTE — Telephone Encounter (Signed)
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 Declines to schedule another Colonoscopy at this time. Says she will watch for signs and symptoms and call us if she needs an appointment.

## 2017-10-15 DIAGNOSIS — M542 Cervicalgia: Secondary | ICD-10-CM | POA: Diagnosis not present

## 2017-10-22 DIAGNOSIS — B029 Zoster without complications: Secondary | ICD-10-CM | POA: Diagnosis not present

## 2017-11-11 ENCOUNTER — Other Ambulatory Visit: Payer: Self-pay | Admitting: Orthopedic Surgery

## 2017-11-12 ENCOUNTER — Encounter (HOSPITAL_BASED_OUTPATIENT_CLINIC_OR_DEPARTMENT_OTHER): Payer: Self-pay

## 2017-11-12 ENCOUNTER — Ambulatory Visit (HOSPITAL_BASED_OUTPATIENT_CLINIC_OR_DEPARTMENT_OTHER): Admit: 2017-11-12 | Payer: PPO | Admitting: Orthopedic Surgery

## 2017-11-12 SURGERY — CARPOMETACARPEL (CMC) SUSPENSION PLASTY
Anesthesia: Regional | Laterality: Right

## 2017-11-23 ENCOUNTER — Telehealth: Payer: Self-pay

## 2017-11-23 NOTE — Telephone Encounter (Signed)
   Primary Cardiologist: [name], MD  Chart reviewed as part of pre-operative protocol coverage. Patient was contacted 11/23/2017 in reference to pre-operative risk assessment for pending surgery as outlined below.  Tiffany Caldwell was last seen on 09/07/17 by Dr. Saunders Revel.  Since that day, TELA KOTECKI has done well. She denies new symptoms and can complete more than 4.0 METS.  Therefore, based on ACC/AHA guidelines, the patient would be at acceptable risk for the planned procedure without further cardiovascular testing.   I will route this recommendation to the requesting party via Epic fax function and remove from pre-op pool.  Faxed to [phone] as per intake note.  Please call with questions.  Tami Lin Gable [place], PA 11/23/2017, 1:46 PM

## 2017-11-23 NOTE — Telephone Encounter (Signed)
   Tiffany Caldwell Pre-operative Risk Assessment                        Please See 09-25-2017 Telephone Encounter Request for surgical clearance:  1. What type of surgery is being performed?    - Suspensionplasty RIGHT Thumb  2. When is this surgery scheduled?    - 12/17/2017   - Date changed from February  3. Are there any medications that need to be held prior to surgery and how long?   - Aspirin   - Please advise  4. Practice name and name of physician performing surgery?    - [facility] Dr. Daryll Caldwell  5. What is your office phone and fax number?   - Phone: [phone]   - Fax: [phone] (ATTN: Tiffany Brightly, RN)  6. Anesthesia type (None, local, MAC, general) ?    - General or Block   Tiffany Caldwell 11/23/2017, 11:12 AM  _________________________________________________________________   (provider comments below)

## 2017-11-26 DIAGNOSIS — M542 Cervicalgia: Secondary | ICD-10-CM | POA: Diagnosis not present

## 2017-11-30 DIAGNOSIS — M542 Cervicalgia: Secondary | ICD-10-CM | POA: Diagnosis not present

## 2017-12-07 DIAGNOSIS — M542 Cervicalgia: Secondary | ICD-10-CM | POA: Diagnosis not present

## 2017-12-08 ENCOUNTER — Other Ambulatory Visit: Payer: PPO

## 2017-12-08 DIAGNOSIS — I10 Essential (primary) hypertension: Secondary | ICD-10-CM | POA: Diagnosis not present

## 2017-12-08 DIAGNOSIS — E210 Primary hyperparathyroidism: Secondary | ICD-10-CM | POA: Diagnosis not present

## 2017-12-08 DIAGNOSIS — E785 Hyperlipidemia, unspecified: Secondary | ICD-10-CM | POA: Diagnosis not present

## 2017-12-08 DIAGNOSIS — I2510 Atherosclerotic heart disease of native coronary artery without angina pectoris: Secondary | ICD-10-CM

## 2017-12-08 DIAGNOSIS — M5136 Other intervertebral disc degeneration, lumbar region: Secondary | ICD-10-CM | POA: Diagnosis not present

## 2017-12-08 DIAGNOSIS — I25810 Atherosclerosis of coronary artery bypass graft(s) without angina pectoris: Secondary | ICD-10-CM | POA: Diagnosis not present

## 2017-12-08 LAB — LIPID PANEL
CHOL/HDL RATIO: 2.2 ratio (ref 0.0–4.4)
Cholesterol, Total: 155 mg/dL (ref 100–199)
HDL: 72 mg/dL (ref >39)
LDL Calculated: 67 mg/dL (ref 0–99)
TRIGLYCERIDES: 79 mg/dL (ref 0–149)
VLDL Cholesterol Cal: 16 mg/dL (ref 5–40)

## 2017-12-08 LAB — ALT: ALT: 19 IU/L (ref 0–32)

## 2017-12-10 DIAGNOSIS — M542 Cervicalgia: Secondary | ICD-10-CM | POA: Diagnosis not present

## 2017-12-11 ENCOUNTER — Encounter (HOSPITAL_BASED_OUTPATIENT_CLINIC_OR_DEPARTMENT_OTHER): Payer: Self-pay | Admitting: *Deleted

## 2017-12-11 ENCOUNTER — Other Ambulatory Visit: Payer: Self-pay

## 2017-12-15 DIAGNOSIS — M542 Cervicalgia: Secondary | ICD-10-CM | POA: Diagnosis not present

## 2017-12-17 ENCOUNTER — Encounter (HOSPITAL_BASED_OUTPATIENT_CLINIC_OR_DEPARTMENT_OTHER)
Admission: RE | Disposition: A | Discharge status: Home / Self Care | Payer: Self-pay | Source: Ambulatory Visit | Attending: Orthopedic Surgery

## 2017-12-17 ENCOUNTER — Ambulatory Visit (HOSPITAL_BASED_OUTPATIENT_CLINIC_OR_DEPARTMENT_OTHER)
Admission: RE | Admit: 2017-12-17 | Discharge: 2017-12-17 | Disposition: A | Discharge status: Home / Self Care | Payer: PPO | Source: Ambulatory Visit | Attending: Orthopedic Surgery | Admitting: Orthopedic Surgery

## 2017-12-17 ENCOUNTER — Ambulatory Visit (HOSPITAL_BASED_OUTPATIENT_CLINIC_OR_DEPARTMENT_OTHER): Payer: PPO | Admitting: Certified Registered"

## 2017-12-17 ENCOUNTER — Encounter (HOSPITAL_BASED_OUTPATIENT_CLINIC_OR_DEPARTMENT_OTHER): Payer: Self-pay | Admitting: Certified Registered"

## 2017-12-17 ENCOUNTER — Other Ambulatory Visit: Payer: Self-pay

## 2017-12-17 DIAGNOSIS — E1140 Type 2 diabetes mellitus with diabetic neuropathy, unspecified: Secondary | ICD-10-CM | POA: Diagnosis not present

## 2017-12-17 DIAGNOSIS — Z96641 Presence of right artificial hip joint: Secondary | ICD-10-CM | POA: Insufficient documentation

## 2017-12-17 DIAGNOSIS — Z853 Personal history of malignant neoplasm of breast: Secondary | ICD-10-CM | POA: Insufficient documentation

## 2017-12-17 DIAGNOSIS — Z87891 Personal history of nicotine dependence: Secondary | ICD-10-CM | POA: Insufficient documentation

## 2017-12-17 DIAGNOSIS — I2510 Atherosclerotic heart disease of native coronary artery without angina pectoris: Secondary | ICD-10-CM | POA: Insufficient documentation

## 2017-12-17 DIAGNOSIS — I10 Essential (primary) hypertension: Secondary | ICD-10-CM | POA: Insufficient documentation

## 2017-12-17 DIAGNOSIS — E785 Hyperlipidemia, unspecified: Secondary | ICD-10-CM | POA: Diagnosis not present

## 2017-12-17 DIAGNOSIS — M1811 Unilateral primary osteoarthritis of first carpometacarpal joint, right hand: Secondary | ICD-10-CM | POA: Diagnosis not present

## 2017-12-17 DIAGNOSIS — J439 Emphysema, unspecified: Secondary | ICD-10-CM | POA: Insufficient documentation

## 2017-12-17 DIAGNOSIS — Z9012 Acquired absence of left breast and nipple: Secondary | ICD-10-CM | POA: Diagnosis not present

## 2017-12-17 DIAGNOSIS — Z951 Presence of aortocoronary bypass graft: Secondary | ICD-10-CM | POA: Insufficient documentation

## 2017-12-17 DIAGNOSIS — K219 Gastro-esophageal reflux disease without esophagitis: Secondary | ICD-10-CM | POA: Diagnosis not present

## 2017-12-17 DIAGNOSIS — M180 Bilateral primary osteoarthritis of first carpometacarpal joints: Secondary | ICD-10-CM | POA: Insufficient documentation

## 2017-12-17 DIAGNOSIS — M79641 Pain in right hand: Secondary | ICD-10-CM | POA: Diagnosis present

## 2017-12-17 HISTORY — PX: TENDON TRANSFER: SHX6109

## 2017-12-17 HISTORY — PX: CARPOMETACARPEL SUSPENSION PLASTY: SHX5005

## 2017-12-17 HISTORY — DX: Atherosclerotic heart disease of native coronary artery without angina pectoris: I25.10

## 2017-12-17 SURGERY — CARPOMETACARPEL (CMC) SUSPENSION PLASTY
Anesthesia: Monitor Anesthesia Care | Site: Thumb | Laterality: Right

## 2017-12-17 MED ORDER — LIDOCAINE HCL (CARDIAC) 20 MG/ML IV SOLN
INTRAVENOUS | Status: DC | PRN
  Administered 2017-12-17: 30 mg via INTRAVENOUS

## 2017-12-17 MED ORDER — CEFAZOLIN SODIUM-DEXTROSE 2-4 GM/100ML-% IV SOLN
2.0000 g | INTRAVENOUS | Status: AC
  Administered 2017-12-17: 2 g via INTRAVENOUS

## 2017-12-17 MED ORDER — FENTANYL CITRATE (PF) 100 MCG/2ML IJ SOLN
50.0000 ug | INTRAMUSCULAR | Status: DC | PRN
  Administered 2017-12-17: 50 ug via INTRAVENOUS

## 2017-12-17 MED ORDER — PROPOFOL 500 MG/50ML IV EMUL
INTRAVENOUS | Status: DC | PRN
  Administered 2017-12-17: 25 ug/kg/min via INTRAVENOUS

## 2017-12-17 MED ORDER — OXYCODONE-ACETAMINOPHEN 5-325 MG PO TABS: 1.0000 | ORAL_TABLET | ORAL | 0 refills | Status: DC | PRN

## 2017-12-17 MED ORDER — MIDAZOLAM HCL 2 MG/2ML IJ SOLN: 1.0000 mg | INTRAMUSCULAR | Status: DC | PRN

## 2017-12-17 MED ORDER — ONDANSETRON HCL 4 MG/2ML IJ SOLN
INTRAMUSCULAR | Status: DC | PRN
  Administered 2017-12-17: 4 mg via INTRAVENOUS

## 2017-12-17 MED ORDER — ROPIVACAINE HCL 7.5 MG/ML IJ SOLN
INTRAMUSCULAR | Status: DC | PRN
  Administered 2017-12-17: 20 mL via PERINEURAL

## 2017-12-17 MED ORDER — CHLORHEXIDINE GLUCONATE 4 % EX LIQD: 60.0000 mL | Freq: Once | CUTANEOUS | Status: DC

## 2017-12-17 MED ORDER — SCOPOLAMINE 1 MG/3DAYS TD PT72: 1.0000 | MEDICATED_PATCH | Freq: Once | TRANSDERMAL | Status: DC | PRN

## 2017-12-17 MED ORDER — LACTATED RINGERS IV SOLN
INTRAVENOUS | Status: DC
  Administered 2017-12-17: 08:00:00 via INTRAVENOUS

## 2017-12-17 MED ORDER — FENTANYL CITRATE (PF) 100 MCG/2ML IJ SOLN
INTRAMUSCULAR | Status: AC
  Filled 2017-12-17: qty 2

## 2017-12-17 SURGICAL SUPPLY — 78 items
BAG DECANTER FOR FLEXI CONT (MISCELLANEOUS) IMPLANT
BIT DRILL 7/64X5 DISP (BIT) ×3 IMPLANT
BLADE MINI RND TIP GREEN BEAV (BLADE) ×6 IMPLANT
BLADE SURG 15 STRL LF DISP TIS (BLADE) ×1 IMPLANT
BLADE SURG 15 STRL SS (BLADE) ×2
BNDG COHESIVE 3X5 TAN STRL LF (GAUZE/BANDAGES/DRESSINGS) ×3 IMPLANT
BNDG ESMARK 4X9 LF (GAUZE/BANDAGES/DRESSINGS) ×3 IMPLANT
BNDG GAUZE ELAST 4 BULKY (GAUZE/BANDAGES/DRESSINGS) ×3 IMPLANT
CHLORAPREP W/TINT 26ML (MISCELLANEOUS) ×3 IMPLANT
CORD BIPOLAR FORCEPS 12FT (ELECTRODE) ×3 IMPLANT
COTTONBALL LRG STERILE PKG (GAUZE/BANDAGES/DRESSINGS) IMPLANT
COVER BACK TABLE 60X90IN (DRAPES) ×3 IMPLANT
COVER MAYO STAND STRL (DRAPES) ×3 IMPLANT
CUFF TOURNIQUET SINGLE 18IN (TOURNIQUET CUFF) IMPLANT
DECANTER SPIKE VIAL GLASS SM (MISCELLANEOUS) IMPLANT
DRAIN TLS ROUND 10FR (DRAIN) IMPLANT
DRAPE EXTREMITY T 121X128X90 (DRAPE) ×3 IMPLANT
DRAPE OEC MINIVIEW 54X84 (DRAPES) ×3 IMPLANT
DRAPE SURG 17X23 STRL (DRAPES) ×3 IMPLANT
DRSG PAD ABDOMINAL 8X10 ST (GAUZE/BANDAGES/DRESSINGS) IMPLANT
GAUZE SPONGE 4X4 12PLY STRL (GAUZE/BANDAGES/DRESSINGS) ×3 IMPLANT
GAUZE SPONGE 4X4 16PLY XRAY LF (GAUZE/BANDAGES/DRESSINGS) IMPLANT
GAUZE XEROFORM 1X8 LF (GAUZE/BANDAGES/DRESSINGS) ×3 IMPLANT
GLOVE BIOGEL PI IND STRL 7.0 (GLOVE) ×1 IMPLANT
GLOVE BIOGEL PI IND STRL 8.5 (GLOVE) ×1 IMPLANT
GLOVE BIOGEL PI INDICATOR 7.0 (GLOVE) ×2
GLOVE BIOGEL PI INDICATOR 8.5 (GLOVE) ×2
GLOVE ECLIPSE 6.5 STRL STRAW (GLOVE) ×3 IMPLANT
GLOVE ECLIPSE 7.0 STRL STRAW (GLOVE) ×3 IMPLANT
GLOVE SURG ORTHO 8.0 STRL STRW (GLOVE) ×3 IMPLANT
GOWN STRL REUS W/ TWL LRG LVL3 (GOWN DISPOSABLE) ×1 IMPLANT
GOWN STRL REUS W/TWL LRG LVL3 (GOWN DISPOSABLE) ×2
GOWN STRL REUS W/TWL XL LVL3 (GOWN DISPOSABLE) ×6 IMPLANT
K-WIRE .035X4 (WIRE) IMPLANT
LOOP VESSEL MAXI BLUE (MISCELLANEOUS) IMPLANT
NEEDLE HYPO 22GX1.5 SAFETY (NEEDLE) IMPLANT
NEEDLE KEITH (NEEDLE) IMPLANT
NEEDLE PRECISIONGLIDE 27X1.5 (NEEDLE) IMPLANT
NS IRRIG 1000ML POUR BTL (IV SOLUTION) ×3 IMPLANT
PACK BASIN DAY SURGERY FS (CUSTOM PROCEDURE TRAY) ×3 IMPLANT
PAD CAST 3X4 CTTN HI CHSV (CAST SUPPLIES) ×1 IMPLANT
PADDING CAST ABS 3INX4YD NS (CAST SUPPLIES)
PADDING CAST ABS 4INX4YD NS (CAST SUPPLIES) ×2
PADDING CAST ABS COTTON 3X4 (CAST SUPPLIES) IMPLANT
PADDING CAST ABS COTTON 4X4 ST (CAST SUPPLIES) ×1 IMPLANT
PADDING CAST COTTON 3X4 STRL (CAST SUPPLIES) ×2
SLEEVE SCD COMPRESS KNEE MED (MISCELLANEOUS) ×3 IMPLANT
SPLINT PLASTER CAST XFAST 3X15 (CAST SUPPLIES) ×10 IMPLANT
SPLINT PLASTER XTRA FASTSET 3X (CAST SUPPLIES) ×20
STOCKINETTE 4X48 STRL (DRAPES) ×3 IMPLANT
SUT CHROMIC 5 0 P 3 (SUTURE) IMPLANT
SUT ETHIBOND 3-0 V-5 (SUTURE) ×3 IMPLANT
SUT ETHILON 4 0 PS 2 18 (SUTURE) ×6 IMPLANT
SUT FIBERWIRE 2-0 18 17.9 3/8 (SUTURE)
SUT FIBERWIRE 4-0 18 DIAM BLUE (SUTURE) ×3
SUT FIBERWIRE 4-0 18 TAPR NDL (SUTURE)
SUT MERSILENE 2.0 SH NDLE (SUTURE) IMPLANT
SUT MERSILENE 3 0 FS 1 (SUTURE) IMPLANT
SUT MERSILENE 4 0 P 3 (SUTURE) IMPLANT
SUT PROLENE 2 0 SH DA (SUTURE) IMPLANT
SUT SILK 2 0 PERMA HAND 18 BK (SUTURE) IMPLANT
SUT SILK 4 0 PS 2 (SUTURE) IMPLANT
SUT STEEL 3 0 (SUTURE) ×3 IMPLANT
SUT VIC AB 3-0 PS1 18 (SUTURE)
SUT VIC AB 3-0 PS1 18XBRD (SUTURE) IMPLANT
SUT VIC AB 4-0 P-3 18XBRD (SUTURE) ×1 IMPLANT
SUT VIC AB 4-0 P2 18 (SUTURE) IMPLANT
SUT VIC AB 4-0 P3 18 (SUTURE) ×2
SUT VICRYL 4-0 PS2 18IN ABS (SUTURE) IMPLANT
SUTURE FIBERWR 2-0 18 17.9 3/8 (SUTURE) IMPLANT
SUTURE FIBERWR 4-0 18 DIA BLUE (SUTURE) ×1 IMPLANT
SUTURE FIBERWR 4-0 18 TAPR NDL (SUTURE) IMPLANT
SYR BULB 3OZ (MISCELLANEOUS) ×3 IMPLANT
SYR CONTROL 10ML LL (SYRINGE) IMPLANT
TOWEL OR 17X24 6PK STRL BLUE (TOWEL DISPOSABLE) ×6 IMPLANT
TOWEL OR NON WOVEN STRL DISP B (DISPOSABLE) ×3 IMPLANT
TUBE FEEDING ENTERAL 5FR 16IN (TUBING) IMPLANT
UNDERPAD 30X30 (UNDERPADS AND DIAPERS) ×3 IMPLANT

## 2017-12-17 NOTE — Anesthesia Procedure Notes (Signed)
Anesthesia Regional Block: Supraclavicular block   Pre-Anesthetic Checklist: ,, timeout performed, Correct Patient, Correct Site, Correct Laterality, Correct Procedure, Correct Position, site marked, Risks and benefits discussed,  Surgical consent,  Pre-op evaluation,  At surgeon's request and post-op pain management  Laterality: Right  Prep: chloraprep       Needles:  Injection technique: Single-shot  Needle Type: Echogenic Needle     Needle Length: 9cm  Needle Gauge: 21     Additional Needles:   Procedures:,,,, ultrasound used (permanent image in chart),,,,  Narrative:  Start time: 12/17/2017 7:55 AM End time: 12/17/2017 8:00 AM Injection made incrementally with aspirations every 5 mL.  Performed by: Personally  Anesthesiologist: Catalina Gravel, MD  Additional Notes: No pain on injection. No increased resistance to injection. Injection made in 5cc increments.  Good needle visualization.  Patient tolerated procedure well.

## 2017-12-17 NOTE — Op Note (Signed)
I assisted Surgeon(s) and Role:    * Daryll Brod, MD - Primary    * Leanora Cover, MD - Assisting on the Procedure(s): SUSPENSIONPLASTY RIGHT THUMB WITH EXCISION TRAPEZIUM ABDUCTOR POLLICIS LONGUS TRANSFER on 12/17/2017.  I provided assistance on this case as follows: retraction soft tissues, harvest of tendon, passing of tendon, closure wounds.  Electronically signed by: Tennis Must, MD Date: 12/17/2017 Time: 10:03 AM

## 2017-12-17 NOTE — Anesthesia Postprocedure Evaluation (Signed)
Anesthesia Post Note  Patient: Tiffany Caldwell  Procedure(s) Performed: SUSPENSIONPLASTY RIGHT THUMB WITH EXCISION TRAPEZIUM (Right Thumb) ABDUCTOR POLLICIS LONGUS TRANSFER (Right Thumb)     Patient location during evaluation: PACU Anesthesia Type: Regional and MAC Level of consciousness: awake and alert Pain management: pain level controlled Vital Signs Assessment: post-procedure vital signs reviewed and stable Respiratory status: spontaneous breathing, nonlabored ventilation, respiratory function stable and patient connected to nasal cannula oxygen Cardiovascular status: stable and blood pressure returned to baseline Postop Assessment: no apparent nausea or vomiting Anesthetic complications: no    Last Vitals:  Vitals:   12/17/17 1017 12/17/17 1052  BP:  (!) 144/66  Pulse:  69  Resp:  18  Temp:  36.4 C  SpO2: 94% 96%    Last Pain:  Vitals:   12/17/17 1052  TempSrc: Oral  PainSc: 0-No pain                 Catalina Gravel

## 2017-12-17 NOTE — H&P (Signed)
Tiffany Caldwell is an 77 y.o. female.   Chief Complaint: pain right thumbHPI: Tiffany Caldwell s a 77 yo female  complaining of both hands with pain at the basilar area of the thumbs bilaterally right greater than left. This been going on for a constant basis but increasing over the past 6 months on her right side. This a dull aching pain with a VAS score of 8/10 with any use especially gripping or pinching. She has been taking Tylenol using a paraffin wax bath she does have soft splints which she has been wearing. She continues to complain of increasing pain. . She has a history of arthritis. . She has a history of diabetes thyroid problems or gout. Family history is positive for arthritis and high blood pressure negative for thyroid problems arthritis and gout. She has had injections to each side. She states that the right side is particularly bothersome for her. We have with a VAS score 8/10. It is not constant. She states however that she will be sitting in her hand will begin to hurt. She localizes at the basilar joint of her thumb on her right side. Injections have entirely worn off.          Past Medical History:  Diagnosis Date  . Allergy   . Arthritis   . Breast cancer ([place])    left breast  . Coronary artery disease 2001   CABG  . DDD (degenerative disc disease), lumbosacral   . Dyspnea    if rushing or climmbing lots of stairs  . Emphysema lung ([facility])    emphysema  . GERD (gastroesophageal reflux disease)    11/20/16- no a current problem  . Hyperlipidemia   . Hypertension   . Irritable bowel syndrome (IBS)   . Lung disorder    leison  . Neuropathy   . Pinched nerve    back and left leg  . Pneumonia 2016    Past Surgical History:  Procedure Laterality Date  . BREAST FIBROADENOMA SURGERY  01/13/1980  . BREAST SURGERY  02/01/2004   tram/mastectomyfor recurrence  . COLONOSCOPY    . CORONARY ANGIOPLASTY  2001   had tear to two vessels- had to have open heart surgery  .  CORONARY ARTERY BYPASS GRAFT  2001  . EYE SURGERY Bilateral    Cataract  . KNEE ARTHROSCOPY  January 2011   right  . MASTECTOMY PARTIAL / LUMPECTOMY W/ AXILLARY LYMPHADENECTOMY  04/10/1989   Dr Annamaria Boots / left breast  . PARTIAL HYSTERECTOMY     vaginal  . TONSILLECTOMY AND ADENOIDECTOMY    . TOTAL HIP ARTHROPLASTY Right 08/05/2016   Procedure: TOTAL HIP ARTHROPLASTY ANTERIOR APPROACH;  Surgeon: [name], MD;  Location: [facility];  Service: Orthopedics;  Laterality: Right;  . UPPER GASTROINTESTINAL ENDOSCOPY    . VIDEO BRONCHOSCOPY WITH ENDOBRONCHIAL NAVIGATION N/A 11/27/2016   Procedure: VIDEO BRONCHOSCOPY WITH ENDOBRONCHIAL NAVIGATION;  Surgeon: [name], MD;  Location: [facility] OR;  Service: Thoracic;  Laterality: N/A;    Family History  Problem Relation Age of Onset  . Emphysema Father        deceased  . Heart disease Mother        deceased  . Colon cancer Paternal Grandmother   . Colon cancer Son   . Colon cancer Paternal Uncle    Social History:  reports that she quit smoking about 28 years ago. Her smoking use included cigarettes. She quit after 30.00 years of use. she has never used smokeless tobacco.  She reports that she drinks about 0.6 oz of alcohol per week. She reports that she does not use drugs.  Allergies:  Allergies  Allergen Reactions  . Hydromorphone Other (See Comments)    ALTERED MENTAL STATUS Altered mental status  . Azithromycin Rash  . Ciprofloxacin Nausea Only  . Hydromorphone Hcl Other (See Comments)    Altered mental status Altered mental status  . Levofloxacin Nausea Only  . Lisinopril Other (See Comments)    Fatigue  Fatigue    No medications prior to admission.    No results found for this or any previous visit (from the past 48 hour(s)).  No results found.   Pertinent items are noted in HPI.  Height 5\' 6"  (1.676 m), weight 65.3 kg (144 lb).  General appearance: alert, cooperative and appears stated age Head: Normocephalic,  without obvious abnormality Neck: no JVD Resp: clear to auscultation bilaterally Cardio: regular rate and rhythm, S1, S2 normal, no murmur, click, rub or gallop GI: soft, non-tender; bowel sounds normal; no masses,  no organomegaly Extremities: pain base right thumb Pulses: 2+ and symmetric Skin: Skin color, texture, turgor normal. No rashes or lesions Neurologic: Grossly normal Incision/Wound: na  Assessment/Plan Assessment:  Primary osteoarthritis of both first carpometacarpal joints        Plan: She would like to proceed to have this surgically corrected. Pre-peri-and postoperative course are discussed along with risks and complications. She is aware that there is no guarantee to the surgery the possibility of infection recurrence injury to arteries nerves tendons complete relief symptoms dystrophy. She is scheduled for suspension plasty with trapezium excision APL transfer right thumb as an outpatient under regional anesthesia.      Tiffany Caldwell R 12/17/2017, 5:49 AM

## 2017-12-17 NOTE — Transfer of Care (Signed)
Immediate Anesthesia Transfer of Care Note  Patient: Tiffany Caldwell  Procedure(s) Performed: SUSPENSIONPLASTY RIGHT THUMB WITH EXCISION TRAPEZIUM (Right Thumb) ABDUCTOR POLLICIS LONGUS TRANSFER (Right Thumb)  Patient Location: PACU  Anesthesia Type:MAC combined with regional for post-op pain  Level of Consciousness: awake, alert , oriented and patient cooperative  Airway & Oxygen Therapy: Patient Spontanous Breathing and Patient connected to face mask oxygen  Post-op Assessment: Report given to RN and Post -op Vital signs reviewed and stable  Post vital signs: Reviewed and stable  Last Vitals:  Vitals:   12/17/17 1006 12/17/17 1007  BP:    Pulse: 76 76  Resp:  18  Temp:    SpO2: 94% 95%    Last Pain:  Vitals:   12/17/17 0727  TempSrc: Oral         Complications: No apparent anesthesia complications

## 2017-12-17 NOTE — Discharge Instructions (Addendum)

## 2017-12-17 NOTE — Anesthesia Preprocedure Evaluation (Signed)
Anesthesia Evaluation  Patient identified by MRN, date of birth, ID band Patient awake    Reviewed: Allergy & Precautions, NPO status , Patient's Chart, lab work & pertinent test results  Airway Mallampati: II  TM Distance: >3 FB Neck ROM: Full    Dental  (+) Teeth Intact, Dental Advisory Given   Pulmonary shortness of breath and with exertion, COPD, former smoker,    Pulmonary exam normal breath sounds clear to auscultation       Cardiovascular hypertension, Pt. on medications + CAD and + CABG  Normal cardiovascular exam Rhythm:Regular Rate:Normal     Neuro/Psych negative neurological ROS     GI/Hepatic Neg liver ROS, GERD  ,  Endo/Other  negative endocrine ROS  Renal/GU negative Renal ROS     Musculoskeletal  (+) Arthritis , Osteoarthritis,  END STAGE OSTEOARTHRITIC DEGENERATION RIGHT THUMB CARPEMTACARPAL JOINT   Abdominal   Peds  Hematology negative hematology ROS (+)   Anesthesia Other Findings Day of surgery medications reviewed with the patient.  Reproductive/Obstetrics                            Anesthesia Physical Anesthesia Plan  ASA: III  Anesthesia Plan: Regional and MAC   Post-op Pain Management:    Induction: Intravenous  PONV Risk Score and Plan: 2 and Treatment may vary due to age or medical condition, Propofol infusion and Ondansetron  Airway Management Planned: Simple Face Mask  Additional Equipment:   Intra-op Plan:   Post-operative Plan:   Informed Consent: I have reviewed the patients History and Physical, chart, labs and discussed the procedure including the risks, benefits and alternatives for the proposed anesthesia with the patient or authorized representative who has indicated his/her understanding and acceptance.   Dental advisory given  Plan Discussed with:   Anesthesia Plan Comments: (Block plus MAC)        Anesthesia Quick Evaluation

## 2017-12-17 NOTE — Op Note (Signed)
Other Dictation: Dictation Number [id number]

## 2017-12-17 NOTE — Op Note (Signed)
NAMELINNET, BOTTARI            ACCOUNT NO.:  [id number]  MEDICAL RECORD NO.:  [id number]  LOCATION:                                 FACILITY:  PHYSICIAN:  Daryll Brod, M.D.            DATE OF BIRTH:  DATE OF PROCEDURE:  12/17/2017 DATE OF DISCHARGE:                              OPERATIVE REPORT   PREOPERATIVE DIAGNOSIS:  Carpometacarpal arthritis, right thumb.  POSTOPERATIVE DIAGNOSIS:  Carpometacarpal arthritis, right thumb.  OPERATION:  Excision trapezium with abductor pollicis longus tendon transfer, right thumb.  SURGEON:  Daryll Brod, M.D.  ASSISTANT:  Leanora Cover, MD.  ANESTHESIA:  Supraclavicular block with IV sedation.  PLACE OF SURGERY:  [facility].  ANESTHESIOLOGISTS:  Gifford Shave.  HISTORY:  The patient is a 77 year old female with a history of CMC arthritis, bilateral thumb.  This has not responded to conservative treatment.  She has elected to undergo surgical reconstruction of the [facility] joint.  Pre, peri, and postoperative course have been discussed along with risks and complications.  She is aware that there is no guarantee to the surgery; the possibility of infection; recurrence of injury to arteries, nerves, tendons; incomplete relief of symptoms; and dystrophy.  In the preoperative area, the patient is seen, the extremity marked by both patient and surgeon.  Antibiotic given.  DESCRIPTION OF PROCEDURE:  The patient was brought to the operating room after a supraclavicular block was carried out in the preoperative area under the direction of Dr. Gifford Shave.  She was prepped and draped in the supine position with the right arm free.  A 3-minute dry time was allowed.  Time-out taken, confirming the patient and procedure.  The limb was exsanguinated with an Esmarch bandage.  Tourniquet placed high and the arm was inflated to 250 mmHg.  A hockey-stick incision was made over the base of the thumb, carried to the first dorsal extensor compartment.  This was  carried down through subcutaneous tissue. Bleeders were electrocauterized with bipolar.  The radial sensory nerve was identified and protected.  The interval between the extensor pollicis brevis, abductor pollicis longus tendon was incised.  The radial artery was identified proximally.  An incision was then made in the periosteum of the trapezium, carried distally identifying the [facility] joint.  Then, carried proximally identifying the STT joint.  The periosteum was then elevated off from the trapezium dorsally palmarly. The interval between the trapezium trapezoid was entered using a Engineer, mining.  The trapezium was then isolated.  Rongeur was then used to remove it.  Unfortunately, it was fragile enough to break and was removed piecemeal.  The significant arthritic changes were obvious both proximally and distally on the trapezium.  The most dorsal segment of the abductor pollicis longus tendon was then isolated, left attached to the base of the first metacarpal.  With traction, it was identified the musculotendinous junction proximally.  An incision made. This was carried down through subcutaneous tissue.  The tendon was then harvested at the musculotendinous junction bypassing a Financial trader down the fascial sheath.  Through the first dorsal extensor compartment, a monofilament wire was then used as a cheese cutter to deliver  the section tendon proximally, where it was allowed to be transected at the musculotendinous junction.  The wound was irrigated and closed with interrupted 4-0 nylon sutures.  An isolation of the base of the thumb metacarpal was then made with sharp dissection.  The periosteum elevated and drill hole placed through the base of the first metacarpal in a dorsal to palmar direction distally to proximally.  This was with a 760 force drill bit.  The drill was then used to make a hole through the base of the second metacarpal in a proximal volar to  distal dorsal direction exiting the intermetacarpal space between the index and middle fingers.  Separate incision was then made.  The soft tissues dissected free from the egress of the drill bit.  This was then tagged.  The abductor pollicis tendon was then woven through the base of the thumb metacarpal in a dorsal to volar direction and through the index metacarpal and a volar to dorsal direction.  A hemostat was then used to dissect around the base of the second metacarpal beneath the muscles and tendons, and the abductor pollicis longus tendon exiting from the dorsal ulnar aspect of the second metacarpal was then brought into the defect by removal of the trapezium.  This was then woven around the egress of the abductor pollicis longus transfer at the base of the thumb metacarpal with traction on the thumb and sutured into position with multiple figure-of-eight 2-0 Ethibond sutures.  This firmly maintained the thumb in a displaced position away from the distal pole of the scaphoid.  This was confirmed with x-rays AP and lateral direction.  The wound was copiously irrigated with saline.  The capsule was closed with figure-of-eight 4-0 Vicryl sutures.  The subcutaneous tissue with interrupted 4-0 Vicryl and the skin incision in the second webspace and on the base of the thumb was then closed protecting the radial nerve with interrupted 4-0 nylon sutures.  A sterile compressive dorsal palmar splint forearm base were applied.  On deflation of the tourniquet, all fingers and thumb immediately pinked.  She was taken to the recovery room for observation in satisfactory condition.  She will be discharged to home to return to the [facility] in 1 week, on Percocet.          ______________________________ Daryll Brod, M.D.     GK/MEDQ  D:  12/17/2017  T:  12/17/2017  Job:  [id number]

## 2017-12-17 NOTE — Brief Op Note (Signed)
12/17/2017  10:03 AM  PATIENT:  Tiffany Caldwell  77 y.o. female  PRE-OPERATIVE DIAGNOSIS:  END STAGE OSTEOARTHRITIC DEGENERATION RIGHT THUMB CARPEMTACARPAL JOINT  POST-OPERATIVE DIAGNOSIS:  END STAGE OSTEOARTHRITIC DEGENERATION RIGHT THUMB CARPEMTACARPAL JOINT  PROCEDURE:  Procedure(s): SUSPENSIONPLASTY RIGHT THUMB WITH EXCISION TRAPEZIUM (Right) ABDUCTOR POLLICIS LONGUS TRANSFER (Right)  SURGEON:  Surgeon(s) and Role:    * Daryll Brod, MD - Primary    * Leanora Cover, MD - Assisting  PHYSICIAN ASSISTANT:   ASSISTANTS: K Leonel Mccollum,MD   ANESTHESIA:   regional and IV sedation  EBL: 1cc BLOOD ADMINISTERED:none  DRAINS: none   LOCAL MEDICATIONS USED:  NONE  SPECIMEN:  No Specimen  DISPOSITION OF SPECIMEN:  N/A  COUNTS:  YES  TOURNIQUET:   Total Tourniquet Time Documented: Upper Arm (Right) - 54 minutes Total: Upper Arm (Right) - 54 minutes   DICTATION: .Other Dictation: Dictation Number [id number]  PLAN OF CARE: Discharge to home after PACU  PATIENT DISPOSITION:  PACU - hemodynamically stable.

## 2017-12-17 NOTE — Anesthesia Procedure Notes (Signed)
Procedure Name: MAC Date/Time: 12/17/2017 9:10 AM Performed by: Signe Colt, CRNA Pre-anesthesia Checklist: Patient identified, Emergency Drugs available, Suction available, Patient being monitored and Timeout performed Patient Re-evaluated:Patient Re-evaluated prior to induction Oxygen Delivery Method: Simple face mask

## 2017-12-17 NOTE — Progress Notes (Signed)
Assisted Dr. Turk with right, ultrasound guided, supraclavicular block. Side rails up, monitors on throughout procedure. See vital signs in flow sheet. Tolerated Procedure well. 

## 2017-12-18 ENCOUNTER — Encounter (HOSPITAL_BASED_OUTPATIENT_CLINIC_OR_DEPARTMENT_OTHER): Payer: Self-pay | Admitting: Orthopedic Surgery

## 2017-12-23 DIAGNOSIS — M180 Bilateral primary osteoarthritis of first carpometacarpal joints: Secondary | ICD-10-CM | POA: Diagnosis not present

## 2017-12-23 DIAGNOSIS — M1811 Unilateral primary osteoarthritis of first carpometacarpal joint, right hand: Secondary | ICD-10-CM | POA: Diagnosis not present

## 2017-12-23 DIAGNOSIS — M79644 Pain in right finger(s): Secondary | ICD-10-CM | POA: Diagnosis not present

## 2018-01-13 DIAGNOSIS — M180 Bilateral primary osteoarthritis of first carpometacarpal joints: Secondary | ICD-10-CM | POA: Diagnosis not present

## 2018-01-19 DIAGNOSIS — M25641 Stiffness of right hand, not elsewhere classified: Secondary | ICD-10-CM | POA: Diagnosis not present

## 2018-01-19 DIAGNOSIS — M180 Bilateral primary osteoarthritis of first carpometacarpal joints: Secondary | ICD-10-CM | POA: Diagnosis not present

## 2018-01-19 DIAGNOSIS — M79644 Pain in right finger(s): Secondary | ICD-10-CM | POA: Diagnosis not present

## 2018-01-26 DIAGNOSIS — H11442 Conjunctival cysts, left eye: Secondary | ICD-10-CM | POA: Diagnosis not present

## 2018-01-28 DIAGNOSIS — M542 Cervicalgia: Secondary | ICD-10-CM | POA: Diagnosis not present

## 2018-02-02 DIAGNOSIS — D3102 Benign neoplasm of left conjunctiva: Secondary | ICD-10-CM | POA: Diagnosis not present

## 2018-02-03 DIAGNOSIS — M180 Bilateral primary osteoarthritis of first carpometacarpal joints: Secondary | ICD-10-CM | POA: Diagnosis not present

## 2018-02-09 DIAGNOSIS — R29898 Other symptoms and signs involving the musculoskeletal system: Secondary | ICD-10-CM | POA: Diagnosis not present

## 2018-02-11 DIAGNOSIS — M542 Cervicalgia: Secondary | ICD-10-CM | POA: Diagnosis not present

## 2018-02-17 DIAGNOSIS — M542 Cervicalgia: Secondary | ICD-10-CM | POA: Diagnosis not present

## 2018-02-22 DIAGNOSIS — M542 Cervicalgia: Secondary | ICD-10-CM | POA: Diagnosis not present

## 2018-02-25 DIAGNOSIS — M542 Cervicalgia: Secondary | ICD-10-CM | POA: Diagnosis not present

## 2018-03-02 DIAGNOSIS — M542 Cervicalgia: Secondary | ICD-10-CM | POA: Diagnosis not present

## 2018-03-03 DIAGNOSIS — M180 Bilateral primary osteoarthritis of first carpometacarpal joints: Secondary | ICD-10-CM | POA: Diagnosis not present

## 2018-03-04 DIAGNOSIS — M542 Cervicalgia: Secondary | ICD-10-CM | POA: Diagnosis not present

## 2018-03-09 ENCOUNTER — Other Ambulatory Visit: Payer: Self-pay | Admitting: Internal Medicine

## 2018-03-09 NOTE — Telephone Encounter (Signed)
Refill Request.  

## 2018-03-10 DIAGNOSIS — M542 Cervicalgia: Secondary | ICD-10-CM | POA: Diagnosis not present

## 2018-03-11 DIAGNOSIS — M542 Cervicalgia: Secondary | ICD-10-CM | POA: Diagnosis not present

## 2018-03-17 DIAGNOSIS — M542 Cervicalgia: Secondary | ICD-10-CM | POA: Diagnosis not present

## 2018-03-25 DIAGNOSIS — M542 Cervicalgia: Secondary | ICD-10-CM | POA: Diagnosis not present

## 2018-03-30 DIAGNOSIS — M542 Cervicalgia: Secondary | ICD-10-CM | POA: Diagnosis not present

## 2018-04-05 DIAGNOSIS — M25561 Pain in right knee: Secondary | ICD-10-CM | POA: Diagnosis not present

## 2018-04-05 DIAGNOSIS — M25562 Pain in left knee: Secondary | ICD-10-CM | POA: Diagnosis not present

## 2018-04-12 DIAGNOSIS — M1711 Unilateral primary osteoarthritis, right knee: Secondary | ICD-10-CM | POA: Diagnosis not present

## 2018-04-13 DIAGNOSIS — M542 Cervicalgia: Secondary | ICD-10-CM | POA: Diagnosis not present

## 2018-04-19 DIAGNOSIS — M542 Cervicalgia: Secondary | ICD-10-CM | POA: Diagnosis not present

## 2018-04-21 DIAGNOSIS — M542 Cervicalgia: Secondary | ICD-10-CM | POA: Diagnosis not present

## 2018-04-26 DIAGNOSIS — M1711 Unilateral primary osteoarthritis, right knee: Secondary | ICD-10-CM | POA: Diagnosis not present

## 2018-04-26 DIAGNOSIS — M25461 Effusion, right knee: Secondary | ICD-10-CM | POA: Diagnosis not present

## 2018-04-29 DIAGNOSIS — M542 Cervicalgia: Secondary | ICD-10-CM | POA: Diagnosis not present

## 2018-04-30 DIAGNOSIS — M1711 Unilateral primary osteoarthritis, right knee: Secondary | ICD-10-CM | POA: Diagnosis not present

## 2018-05-06 DIAGNOSIS — M542 Cervicalgia: Secondary | ICD-10-CM | POA: Diagnosis not present

## 2018-05-10 DIAGNOSIS — M1711 Unilateral primary osteoarthritis, right knee: Secondary | ICD-10-CM | POA: Diagnosis not present

## 2018-05-13 ENCOUNTER — Other Ambulatory Visit: Payer: Self-pay | Admitting: *Deleted

## 2018-05-13 DIAGNOSIS — R918 Other nonspecific abnormal finding of lung field: Secondary | ICD-10-CM

## 2018-05-13 DIAGNOSIS — M542 Cervicalgia: Secondary | ICD-10-CM | POA: Diagnosis not present

## 2018-05-17 DIAGNOSIS — M1711 Unilateral primary osteoarthritis, right knee: Secondary | ICD-10-CM | POA: Diagnosis not present

## 2018-05-20 DIAGNOSIS — M542 Cervicalgia: Secondary | ICD-10-CM | POA: Diagnosis not present

## 2018-05-25 DIAGNOSIS — Z1231 Encounter for screening mammogram for malignant neoplasm of breast: Secondary | ICD-10-CM | POA: Diagnosis not present

## 2018-05-25 DIAGNOSIS — Z853 Personal history of malignant neoplasm of breast: Secondary | ICD-10-CM | POA: Diagnosis not present

## 2018-06-02 DIAGNOSIS — M1711 Unilateral primary osteoarthritis, right knee: Secondary | ICD-10-CM | POA: Diagnosis not present

## 2018-06-09 ENCOUNTER — Ambulatory Visit
Admission: RE | Admit: 2018-06-09 | Discharge: 2018-06-09 | Disposition: A | Discharge status: Home / Self Care | Payer: PPO | Source: Ambulatory Visit | Attending: Surgery | Admitting: Surgery

## 2018-06-09 ENCOUNTER — Other Ambulatory Visit: Payer: Self-pay

## 2018-06-09 ENCOUNTER — Ambulatory Visit: Payer: PPO | Admitting: Surgery

## 2018-06-09 ENCOUNTER — Encounter: Payer: Self-pay | Admitting: Surgery

## 2018-06-09 VITALS — BP 128/78 | HR 80 | Resp 18 | Ht 66.0 in | Wt 144.6 lb

## 2018-06-09 DIAGNOSIS — R918 Other nonspecific abnormal finding of lung field: Secondary | ICD-10-CM

## 2018-06-10 ENCOUNTER — Encounter: Payer: Self-pay | Admitting: Surgery

## 2018-06-10 NOTE — Progress Notes (Signed)
HPI:  The patient returns today for follow up of a persistent right lower lobe lung cavitary lesion. It hadbeen biopsied twice and showed no malignancy. The cultures from the initial biopsy did grow MAI but ID decided not to treat her since she was asymptomatic and was allergic to Macrolide antibiotics. ACT on 12/18/2015 showed increased wall thickness throughout the cavitary lung mass with new nodular peribronchovascular interstitial thickening extending from the mass to the right hilum with no change in the overall size of the lesion. She underwent ENB with brushings and biopsies by Dr. Roxan Hockey in 11/2016 and these were all negative for malignancy. The biopsies showed no malignancy, only benign lung with cell necrosis, inflammation and calcifications. The cytology was negative for malignancy. The stains showed no fungus or AFB and the cultures were negative.  When I last saw her on 06/10/2017 a CT scan of the chest showed the cavitary lesion in the right lower lobe was stable.  There is a new 7 mm subpleural nodule in the right upper lobe that I thought was probably inflammatory infectious.  Given all the abnormalities in her lungs I thought that we should continue to follow her with CT scan in a year.  She continues to feel well with no cough, sputum, shortness of breath, fever or chills and has normal appetite and stable weight.   Current Outpatient Medications  Medication Sig Dispense Refill  . acetaminophen (TYLENOL) 500 MG tablet Take 1,000 mg by mouth 2 (two) times daily.     Marland Kitchen aspirin EC 81 MG tablet Take 81 mg by mouth daily.    Marland Kitchen atorvastatin (LIPITOR) 20 MG tablet TAKE 1 TABLET BY MOUTH DAILY 90 tablet 1  . Calcium Carbonate-Vitamin D (CALCIUM 600 + D PO) Take 1 tablet by mouth at bedtime.     . Carboxymethylcell-Hypromellose (GENTEAL OP) Place 1 drop into both eyes at bedtime.    . cetirizine (ZYRTEC) 10 MG tablet Take 10 mg by mouth daily.    . fish oil-omega-3 fatty acids  1000 MG capsule Take 1 g by mouth 2 (two) times daily.     Marland Kitchen gabapentin (NEURONTIN) 300 MG capsule Take 300 mg by mouth 4 (four) times daily.     . Glucosamine-Chondroit-Vit C-Mn (GLUCOSAMINE CHONDR 1500 COMPLX PO) Take 1 tablet by mouth 2 (two) times daily.    . hyoscyamine (LEVBID) 0.375 MG 12 hr tablet Take 0.375 mg by mouth every 12 (twelve) hours as needed for cramping (IBS).     . Multiple Vitamin (MULTIVITAMIN) tablet Take 1 tablet by mouth daily.      . valsartan-hydrochlorothiazide (DIOVAN-HCT) 80-12.5 MG per tablet Take 1 tablet by mouth daily.       No current facility-administered medications for this visit.      Physical Exam: BP 128/78 (BP Location: Left Arm, Patient Position: Sitting, Cuff Size: Normal)   Pulse 80   Resp 18   Ht 5\' 6"  (1.676 m)   Wt 144 lb 9.6 oz (65.6 kg)   LMP  (LMP Unknown)   SpO2 94% Comment: RA  BMI 23.34 kg/m  She looks well. There is no cervical or supraclavicular adenopathy. Lungs are clear. Cardiac exam shows regular rate and rhythm with normal heart sounds.  Diagnostic Tests:  CLINICAL DATA:  History of breast cancer. Follow-up pulmonary nodules/masses. Right lower lobe cavitary lung mass biopsy on 11/27/2016 was negative for malignancy.  EXAM: CT CHEST WITHOUT CONTRAST  TECHNIQUE: Multidetector CT imaging of the chest was  performed following the standard protocol without IV contrast.  COMPARISON:  05/27/2017 chest CT.  FINDINGS: Cardiovascular: Normal heart size. No significant pericardial effusion/thickening. Left anterior descending coronary atherosclerosis and coronary stent. Right coronary atherosclerosis. Post CABG changes. Atherosclerotic nonaneurysmal thoracic aorta. Stable top-normal caliber main pulmonary artery (3.2 cm diameter).  Mediastinum/Nodes: No discrete thyroid nodules. Unremarkable esophagus. Left axillary surgical clips are again noted. No axillary adenopathy. No pathologically enlarged mediastinal  or discrete hilar nodes on this noncontrast scan.  Lungs/Pleura: No pneumothorax. No pleural effusion. Moderate to severe centrilobular and paraseptal emphysema with mild diffuse bronchial wall thickening. No acute consolidative airspace disease. Irregular partially cavitary superior segment right lower lobe 3.8 x 3.6 cm lung mass (series 8/image 46) demonstrates new faint internal calcifications and is slightly decreased from 4.1 x 3.5 cm on 05/27/2017 chest CT. Biapical reticulonodular opacities are stable. Additional scattered subcentimeter solid pulmonary nodules in the right lung are stable to slightly decreased. For example a 0.8 cm right lower lobe nodule (series 8/image 60), previously 0.9 cm. A peripheral right upper lobe 0.5 cm nodule (series 8/image 35), previously 0.7 cm. No new significant pulmonary nodules. Stable scattered mild bronchiectasis with associated mucoid impaction in the right middle lobe and lingula.  Upper abdomen: Hypodense 1.0 cm splenic lesion (series 2/image 120), partially visualized, stable since 05/27/2017, most compatible with a benign lesion.  Musculoskeletal: No aggressive appearing focal osseous lesions. Stable postsurgical changes from left mastectomy. Intact sternotomy wires. Moderate thoracic spondylosis.  IMPRESSION: 1. No findings suspicious for metastatic disease in the chest. No new pulmonary nodularity. No thoracic adenopathy. 2. Irregular cavitary superior segment right lower lobe 3.8 cm lung mass is slightly decreased in size and demonstrates new faint internal calcifications, most compatible with chronic granulomatous infection. Scattered solid pulmonary nodules in the right lung are stable to slightly decreased. Stable mild bronchiectasis in the right middle lobe and lingula. 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 3. Moderate to severe emphysema with mild diffuse bronchial  wall thickening, suggesting COPD.  Aortic Atherosclerosis (ICD10-I70.0) and Emphysema (ICD10-J43.9).   Electronically Signed   By: Ilona Sorrel M.D.   On: 06/09/2018 14:44   Impression:  She continues to do well clinically.  Her CT scan today shows the irregular cavitary lesion in the right lower lobe superior segment is slightly decreased in size at 3.8 cm.  There are new faint internal calcifications most compatible with chronic granulomatous infection.  The scattered pulmonary nodules in the right lung are all stable to slightly decreased in size.  It is felt that these findings are most suggestive of chronic atypical mycobacterial infection although previous cultures were negative.  I think we should continue to follow this and repeat her CT scan of the chest in 1 year.  I reviewed the CT images with her and answered her questions.  Plan:  She will return to see me in 1 year with a CT of the chest.  I spent 15 minutes performing this established patient evaluation and > 50% of this time was spent face to face counseling and coordinating the care of this patient's multiple pulmonary nodules.    Gaye Pollack, MD [facility] [phone]

## 2018-06-16 ENCOUNTER — Encounter: Payer: PPO | Admitting: Surgery

## 2018-06-16 ENCOUNTER — Other Ambulatory Visit: Payer: PPO

## 2018-06-21 DIAGNOSIS — M19039 Primary osteoarthritis, unspecified wrist: Secondary | ICD-10-CM | POA: Diagnosis not present

## 2018-06-21 DIAGNOSIS — J449 Chronic obstructive pulmonary disease, unspecified: Secondary | ICD-10-CM | POA: Diagnosis not present

## 2018-06-21 DIAGNOSIS — Z853 Personal history of malignant neoplasm of breast: Secondary | ICD-10-CM | POA: Diagnosis not present

## 2018-06-21 DIAGNOSIS — I25810 Atherosclerosis of coronary artery bypass graft(s) without angina pectoris: Secondary | ICD-10-CM | POA: Diagnosis not present

## 2018-06-21 DIAGNOSIS — I10 Essential (primary) hypertension: Secondary | ICD-10-CM | POA: Diagnosis not present

## 2018-07-01 DIAGNOSIS — I25810 Atherosclerosis of coronary artery bypass graft(s) without angina pectoris: Secondary | ICD-10-CM | POA: Diagnosis not present

## 2018-07-01 DIAGNOSIS — I10 Essential (primary) hypertension: Secondary | ICD-10-CM | POA: Diagnosis not present

## 2018-07-01 DIAGNOSIS — E7800 Pure hypercholesterolemia, unspecified: Secondary | ICD-10-CM | POA: Diagnosis not present

## 2018-07-01 DIAGNOSIS — Z0000 Encounter for general adult medical examination without abnormal findings: Secondary | ICD-10-CM | POA: Diagnosis not present

## 2018-07-01 DIAGNOSIS — R251 Tremor, unspecified: Secondary | ICD-10-CM | POA: Diagnosis not present

## 2018-07-01 DIAGNOSIS — Z1389 Encounter for screening for other disorder: Secondary | ICD-10-CM | POA: Diagnosis not present

## 2018-07-01 DIAGNOSIS — M8580 Other specified disorders of bone density and structure, unspecified site: Secondary | ICD-10-CM | POA: Diagnosis not present

## 2018-07-01 DIAGNOSIS — J449 Chronic obstructive pulmonary disease, unspecified: Secondary | ICD-10-CM | POA: Diagnosis not present

## 2018-07-01 DIAGNOSIS — E210 Primary hyperparathyroidism: Secondary | ICD-10-CM | POA: Diagnosis not present

## 2018-07-01 DIAGNOSIS — Z23 Encounter for immunization: Secondary | ICD-10-CM | POA: Diagnosis not present

## 2018-07-05 DIAGNOSIS — H6121 Impacted cerumen, right ear: Secondary | ICD-10-CM | POA: Diagnosis not present

## 2018-07-22 DIAGNOSIS — H0012 Chalazion right lower eyelid: Secondary | ICD-10-CM | POA: Diagnosis not present

## 2018-08-02 DIAGNOSIS — H0012 Chalazion right lower eyelid: Secondary | ICD-10-CM | POA: Diagnosis not present

## 2018-08-24 ENCOUNTER — Other Ambulatory Visit: Payer: Self-pay | Admitting: Internal Medicine

## 2018-08-24 NOTE — Telephone Encounter (Signed)
Refill Request.  

## 2018-09-07 DIAGNOSIS — M19039 Primary osteoarthritis, unspecified wrist: Secondary | ICD-10-CM | POA: Diagnosis not present

## 2018-09-07 DIAGNOSIS — J449 Chronic obstructive pulmonary disease, unspecified: Secondary | ICD-10-CM | POA: Diagnosis not present

## 2018-09-07 DIAGNOSIS — I10 Essential (primary) hypertension: Secondary | ICD-10-CM | POA: Diagnosis not present

## 2018-09-07 DIAGNOSIS — Z853 Personal history of malignant neoplasm of breast: Secondary | ICD-10-CM | POA: Diagnosis not present

## 2018-09-07 DIAGNOSIS — I25810 Atherosclerosis of coronary artery bypass graft(s) without angina pectoris: Secondary | ICD-10-CM | POA: Diagnosis not present

## 2018-09-09 ENCOUNTER — Encounter: Payer: Self-pay | Admitting: Internal Medicine

## 2018-09-09 ENCOUNTER — Ambulatory Visit: Payer: PPO | Admitting: Internal Medicine

## 2018-09-09 VITALS — BP 116/62 | HR 81 | Ht 66.0 in | Wt 139.8 lb

## 2018-09-09 DIAGNOSIS — R0609 Other forms of dyspnea: Secondary | ICD-10-CM | POA: Diagnosis not present

## 2018-09-09 DIAGNOSIS — I491 Atrial premature depolarization: Secondary | ICD-10-CM | POA: Diagnosis not present

## 2018-09-09 DIAGNOSIS — I10 Essential (primary) hypertension: Secondary | ICD-10-CM

## 2018-09-09 DIAGNOSIS — E785 Hyperlipidemia, unspecified: Secondary | ICD-10-CM

## 2018-09-09 DIAGNOSIS — I2510 Atherosclerotic heart disease of native coronary artery without angina pectoris: Secondary | ICD-10-CM

## 2018-09-09 NOTE — Patient Instructions (Signed)
Medication Instructions:  none If you need a refill on your cardiac medications before your next appointment, please call your pharmacy.   Lab work: none If you have labs (blood work) drawn today and your tests are completely normal, you will receive your results only by: Marland Kitchen MyChart Message (if you have MyChart) OR . A paper copy in the mail If you have any lab test that is abnormal or we need to change your treatment, we will call you to review the results.  Testing/Procedures: none  Follow-Up: At [facility], you and your health needs are our priority.  As part of our continuing mission to provide you with exceptional heart care, we have created designated Provider Care Teams.  These Care Teams include your primary Cardiologist (physician) and Advanced Practice Providers (APPs -  Physician Assistants and Nurse Practitioners) who all work together to provide you with the care you need, when you need it. You will need a follow up appointment in 1 years.  Please call our office 2 months in advance to schedule this appointment.  You may see [name], MD or one of the following Advanced Practice Providers on your designated Care Team:   Murray Hodgkins, NP [name], PA-C . Marrianne Mood, PA-C  Any Other Special Instructions Will Be Listed Below (If Applicable).

## 2018-09-09 NOTE — Progress Notes (Signed)
Follow-up Outpatient Visit Date: 09/09/2018  Primary Care Provider: Lavone Orn, MD [place] [address] [place] [address]  Chief Complaint: Follow-up coronary artery disease  HPI:  Tiffany Caldwell is a 77 y.o. year-old female with history of coronary artery disease status post remote PCI and subsequent emergent CABGfor iatrogenicLMCA/LADdissection in 2001, hypertension, hyperlipidemia, and right lower lobe cavitary lung mass, who presents for follow-up of CAD.  I last saw her a year ago, at which time she was recovering from a right patella fracture.  Chronic exertional dyspnea was stable.  She also noted occasional brief palpitations.  Due to suboptimal LDL control at that time, we agreed to switch simvastatin to atorvastatin 20 mg daily.  Repeat LDL in 12/2017 was better at 67.  Today, Tiffany Caldwell reports she is feeling relatively well.  She notes continued exertional dyspnea when climbing 20-25 steps, which has been stable for years.  She denies chest pain, orthopnea, PND, edema, and lightheadedness.  She notes that her pulse feels irregular when she feels it at times, though she is unaware of palpitations.  She is tolerating atorvastatin well.  --------------------------------------------------------------------------------------------------  Cardiovascular History & Procedures: Cardiovascular Problems:  Coronary artery disease status post PCI and subsequent CABG (iatrogenic LMCA dissection)  Risk Factors:  Known coronary artery disease, hypertension, hyperlipidemia, and age  Cath/PCI:  LHC/PCI(12/16/99): LMCA normal. LAD with 80 and 90% mid vessel stenosis. LCx without significant disease. RCA with 30% proximal stenosis. Successful PCI to mid LAD with a NIR [place]3.0 x 15 mm bare-metal stent.  LHC/PCI (05/28/00): LMCA normal. LAD with 80-90% mid vessel in-stent restenosis. LCx normal. RCA with 30-40% proximal as well as ostial spasm. Plan angioplasty of previous  stent complicated by proximal LAD dissection with placement ofNIR Elite 3.5 x 12 mmbare-metal stent. Further propagation of the dissection to the LMCA occurred, prompting referral for urgent CABG.  CV Surgery:  Urgent CABG (05/28/00): LIMA to LAD, SVG to diagonal, SVG to OM.  EP Procedures and Devices:  None  Non-Invasive Evaluation(s):  TTE (11/08/14): Normal LV size with mild concentric LVH. LVEF 55-60% without regional wall motion abnormalities. Grade 1 diastolic dysfunction. Left atrium is severely dilated. RV size is normal with normal contraction. Mild TR. Otherwise no significant valvular abnormalities.  Recent CV Pertinent Labs: Lab Results  Component Value Date   CHOL 155 12/08/2017   HDL 72 12/08/2017   LDLCALC 67 12/08/2017   TRIG 79 12/08/2017   CHOLHDL 2.2 12/08/2017   INR 1.06 11/20/2016   K 4.8 11/20/2016   BUN 24 (H) 11/20/2016   CREATININE 1.14 (H) 11/20/2016    Past medical and surgical history were reviewed and updated in EPIC.  Current Meds  Medication Sig  . acetaminophen (TYLENOL) 500 MG tablet Take 1,000 mg by mouth 2 (two) times daily.   Marland Kitchen aspirin EC 81 MG tablet Take 81 mg by mouth daily.  Marland Kitchen atorvastatin (LIPITOR) 20 MG tablet TAKE 1 TABLET BY MOUTH DAILY  . Calcium Carbonate-Vitamin D (CALCIUM 600 + D PO) Take 1 tablet by mouth at bedtime.   . Carboxymethylcell-Hypromellose (GENTEAL OP) Place 1 drop into both eyes at bedtime.  . cetirizine (ZYRTEC) 10 MG tablet Take 10 mg by mouth daily.  . fish oil-omega-3 fatty acids 1000 MG capsule Take 1 g by mouth 2 (two) times daily.   Marland Kitchen gabapentin (NEURONTIN) 300 MG capsule Take 300 mg by mouth 4 (four) times daily.   . Glucosamine-Chondroit-Vit C-Mn (GLUCOSAMINE CHONDR 1500 COMPLX PO) Take 1  tablet by mouth 2 (two) times daily.  . hyoscyamine (LEVBID) 0.375 MG 12 hr tablet Take 0.375 mg by mouth every 12 (twelve) hours as needed for cramping (IBS).   . Multiple Vitamin (MULTIVITAMIN) tablet Take 1 tablet  by mouth daily.    . valsartan-hydrochlorothiazide (DIOVAN-HCT) 80-12.5 MG per tablet Take 1 tablet by mouth daily.      Allergies: Hydromorphone; Azithromycin; Ciprofloxacin; Hydromorphone hcl; Levofloxacin; and Lisinopril  Social History   Tobacco Use  . Smoking status: Former Smoker    Years: 30.00    Types: Cigarettes    Last attempt to quit: 07/31/1989    Years since quitting: 29.1  . Smokeless tobacco: Never Used  Substance Use Topics  . Alcohol use: Yes    Alcohol/week: 1.0 standard drinks    Types: 1 Glasses of wine per week    Comment: occasional wine  . Drug use: No    Family History  Problem Relation Age of Onset  . Emphysema Father        deceased  . Heart disease Mother        deceased  . Colon cancer Paternal Grandmother   . Colon cancer Son   . Colon cancer Paternal Uncle     Review of Systems: Ms. Rebuck was recently diagnosed with essential tremor.  Overall, symptoms are mild and medical therapy has been deferred.  Otherwise, A 12-system review of systems was performed and was negative except as noted in the HPI.  --------------------------------------------------------------------------------------------------  Physical Exam: BP 116/62   Pulse 81   Ht 5\' 6"  (1.676 m)   Wt 139 lb 12.8 oz (63.4 kg)   LMP  (LMP Unknown)   SpO2 97%   BMI 22.56 kg/m   General: NAD. HEENT: No conjunctival pallor or scleral icterus. Moist mucous membranes.  OP clear. Neck: Supple without lymphadenopathy, thyromegaly, JVD, or HJR. Lungs: Normal work of breathing. Clear to auscultation bilaterally without wheezes or crackles. Heart: Regular rate and rhythm without murmurs, rubs, or gallops. Non-displaced PMI. Abd: Bowel sounds present. Soft, NT/ND without hepatosplenomegaly Ext: No lower extremity edema. Skin: Warm and dry without rash.  EKG: Normal sinus rhythm with PACs and LVH with abnormal repolarization.  No significant change from prior tracing on  09/07/2017.  Lab Results  Component Value Date   WBC 3.6 (L) 11/20/2016   HGB 13.3 11/20/2016   HCT 41.6 11/20/2016   MCV 98.3 11/20/2016   PLT 149 (L) 11/20/2016    Lab Results  Component Value Date   NA 140 11/20/2016   K 4.8 11/20/2016   CL 98 (L) 11/20/2016   CO2 30 11/20/2016   BUN 24 (H) 11/20/2016   CREATININE 1.14 (H) 11/20/2016   GLUCOSE 111 (H) 11/20/2016   ALT 19 12/08/2017    Lab Results  Component Value Date   CHOL 155 12/08/2017   HDL 72 12/08/2017   LDLCALC 67 12/08/2017   TRIG 79 12/08/2017   CHOLHDL 2.2 12/08/2017    --------------------------------------------------------------------------------------------------  ASSESSMENT AND PLAN: Coronary artery disease without angina No chest pain or progressive shortness of breath to suggest worsening coronary insufficiency.  Continue current medications for secondary prevention.  LDL at goal.  Dyspnea on exertion Chronic and most likely due to underlying lung disease.  No further work-up at this time.  PACs Longstanding and asymptomatic.  No further work-up or intervention at this time.  Hypertension Blood pressure well controlled.  Continue valsartan-HCTZ.  Hyperlipidemia LDL at goal.  Continue atorvastatin 20 mg daily.  Follow-up: Return to clinic in [place] in 1 year.    [name], MD 09/10/2018 8:05 AM

## 2018-09-10 ENCOUNTER — Encounter: Payer: Self-pay | Admitting: Internal Medicine

## 2018-09-10 DIAGNOSIS — I491 Atrial premature depolarization: Secondary | ICD-10-CM | POA: Insufficient documentation

## 2018-10-09 IMAGING — CT CT CHEST SUPER D W/O CM
3 of 4 series · 17 of 30 positions shown, 19 images · non-contrast
Comparison: 06/25/2016

CLINICAL DATA: Right lung mass. History of left breast cancer with
reconstruction in 1222.

EXAM:
CT CHEST WITHOUT CONTRAST
TECHNIQUE: Multidetector CT imaging of the chest was performed using thin slice
collimation for electromagnetic bronchoscopy planning purposes,
without intravenous contrast.

[Series 3: chest w/o · axial · non-contrast · 0.57mm/px · z∈[-237,-2]mm · 8 of 122 slices shown, 10 images]
[im 14/122  mediastinal]
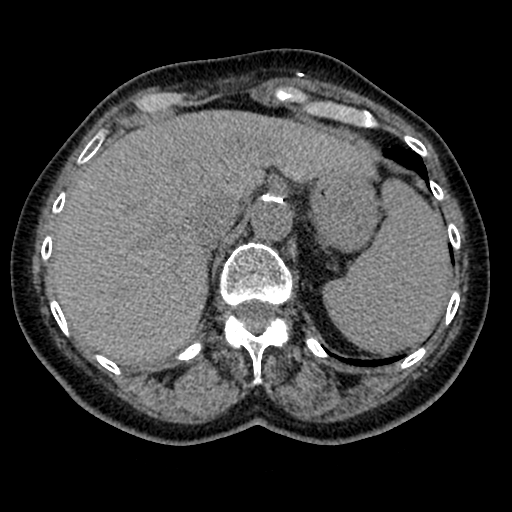
[im 14/122  lung]
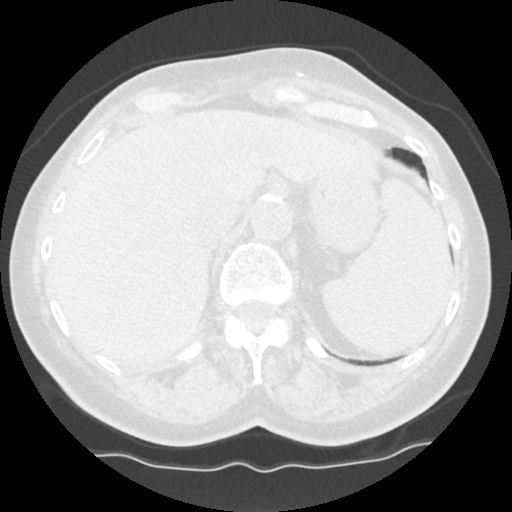
[im 27/122  lung]
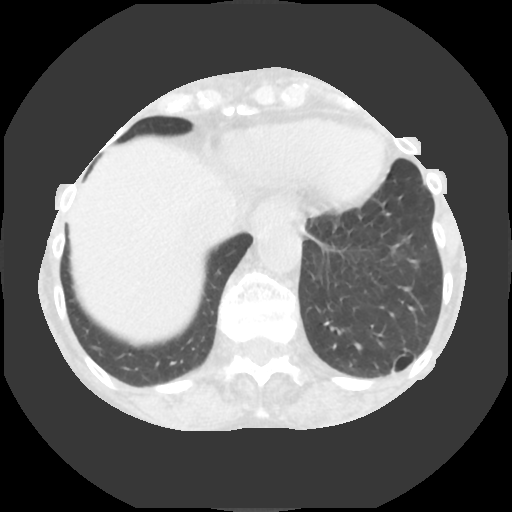
[im 41/122  lung]
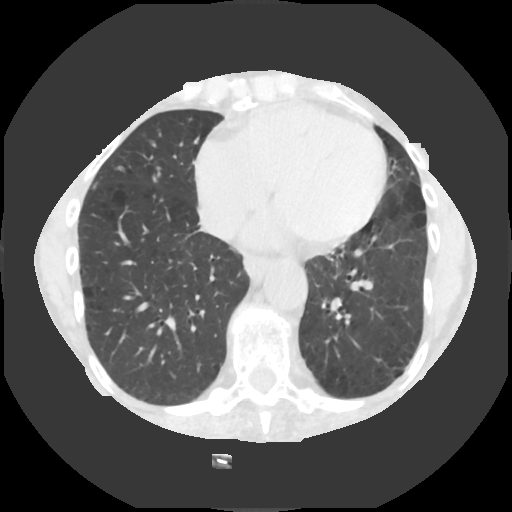
[im 54/122  lung]
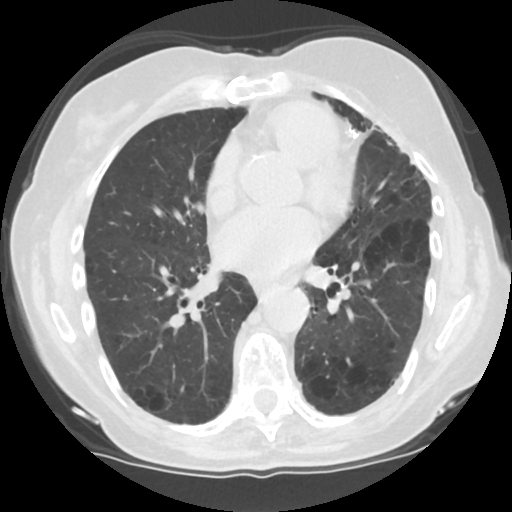
[im 68/122  mediastinal]
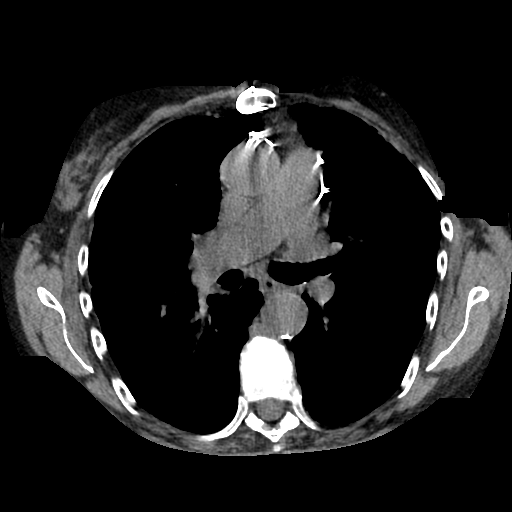
[im 68/122  lung]
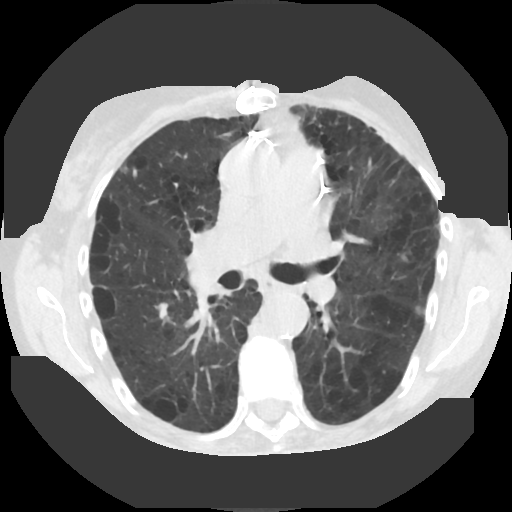
[im 81/122  lung]
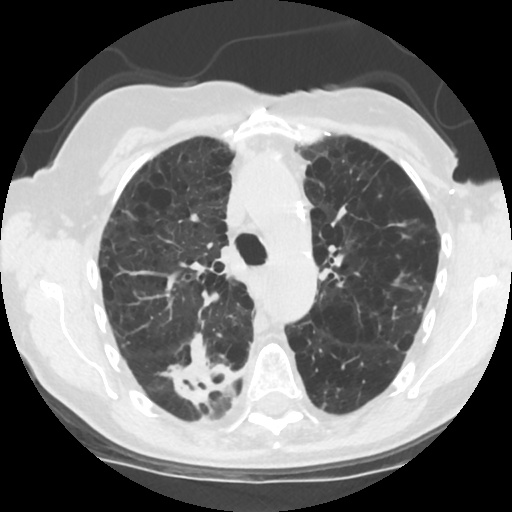
[im 95/122  lung]
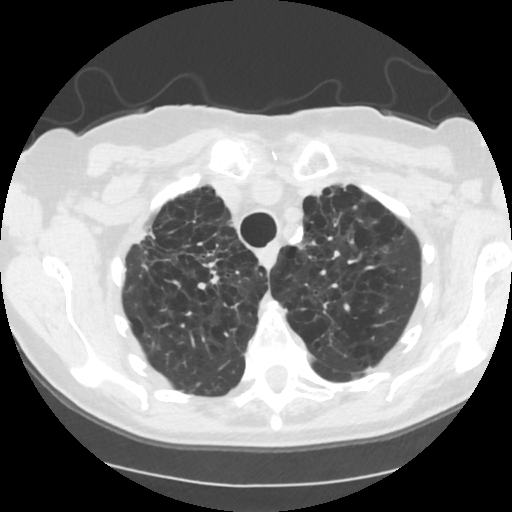
[im 108/122  lung]
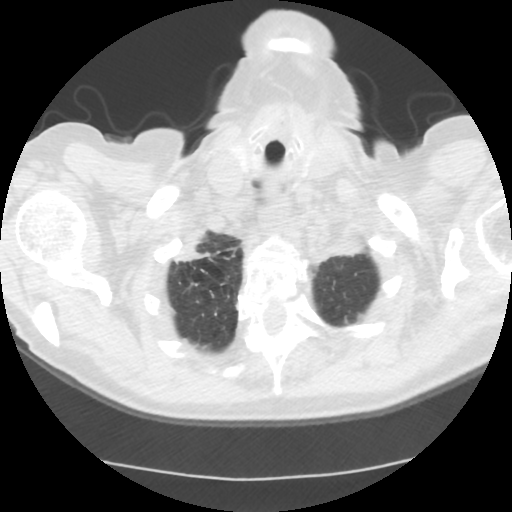

[Series 4: lung windows · axial · 0.57mm/px · z∈[-232,-7]mm · 7 of 122 slices shown]
[im 16/122  lung]
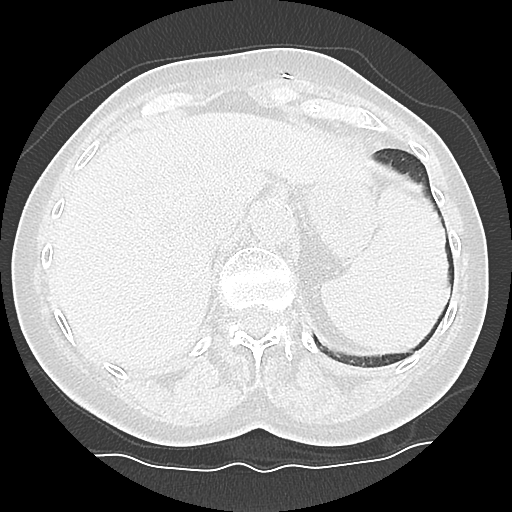
[im 31/122  lung]
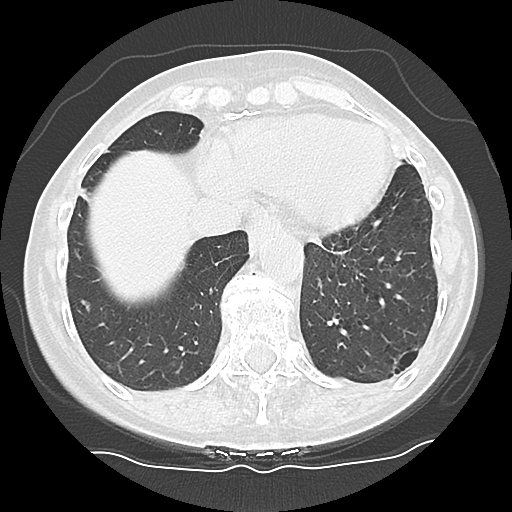
[im 46/122  lung]
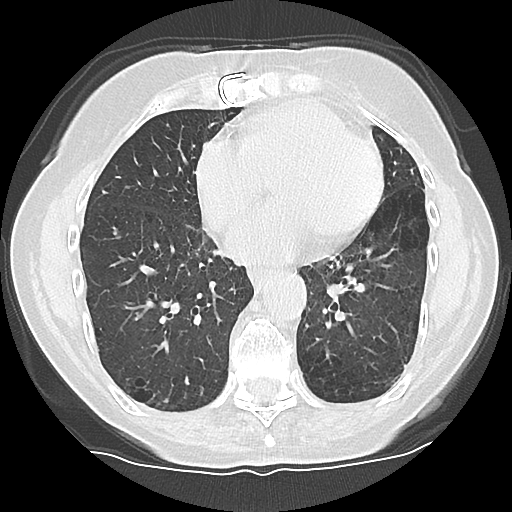
[im 61/122  lung]
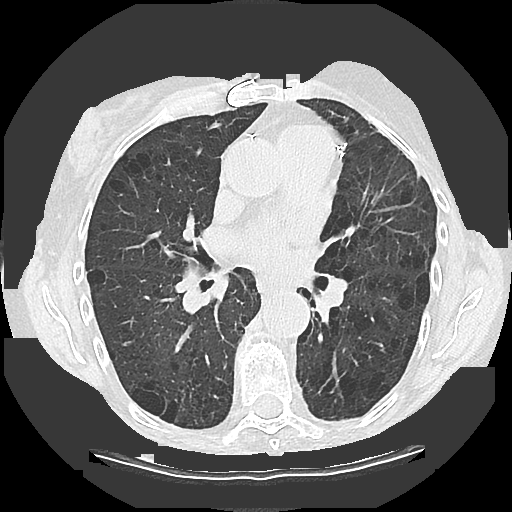
[im 76/122  lung]
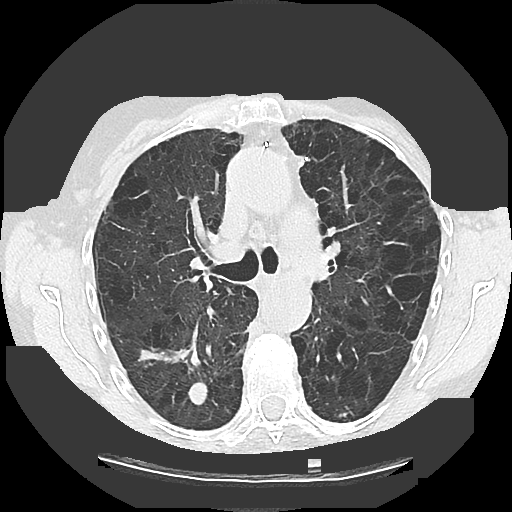
[im 91/122  lung]
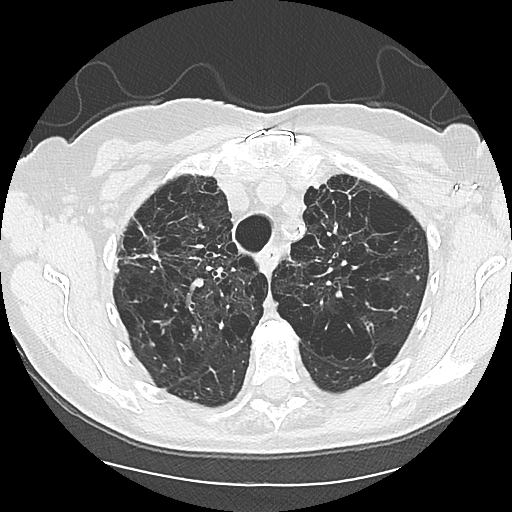
[im 106/122  lung]
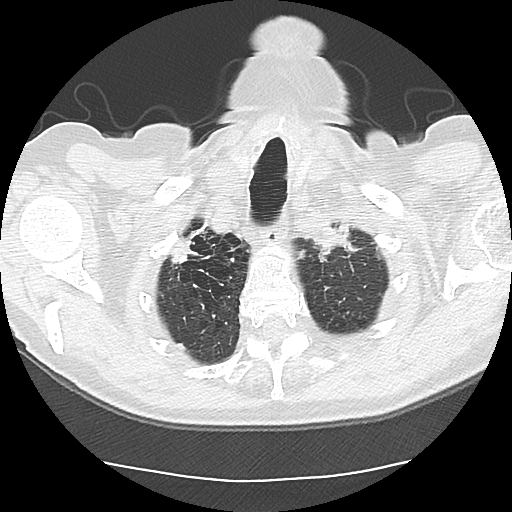

[Series 602: sagittal body · sagittal · 0.59mm/px · 2 of 117 slices shown]
[im 15/117  mediastinal]
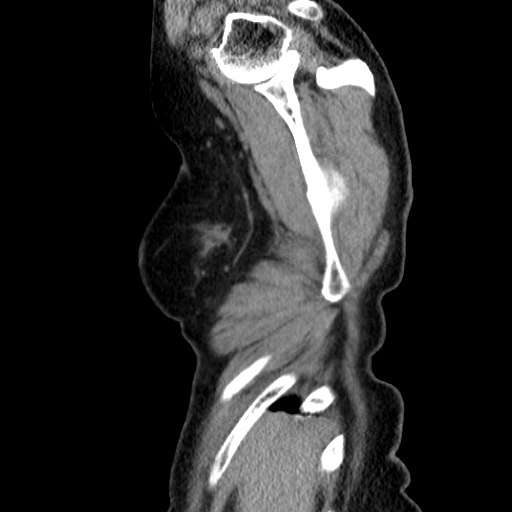
[im 30/117  mediastinal]
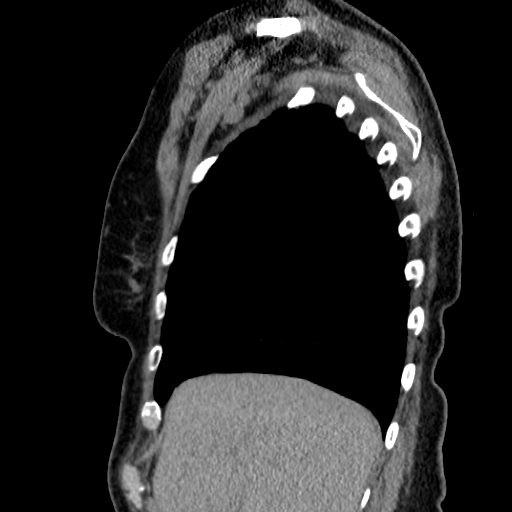

[17 of 30 positions shown; findings below may reference images not displayed]

FINDINGS: Cardiovascular: Heart is normal in size.  No pericardial effusion.

Coronary stent in the LAD. Postsurgical changes related to prior
CABG.

Atherosclerotic calcifications aortic arch.

Mediastinum/Nodes: Small mediastinal lymph nodes which do not meet
pathologic CT size criteria.

Visualized thyroid is unremarkable.

Lungs/Pleura: 4.1 x 3.6 x 2.3 cm thick-walled cavitary lesion in the
superior segment right lower lobe (series 4/ image 43), previously
4.4 x 3.6 x 2.3 cm without central lucency. Adjacent 12 mm posterior
nodular component (series 4/ image 47), unchanged. This may reflect
infection/ inflammation given past biopsy results, but neoplasm is
difficult to exclude.

Underlying moderate to severe centrilobular and paraseptal
emphysematous changes. Biapical pleural-parenchymal scarring.

No focal consolidation.

No pleural effusion or pneumothorax.

Upper Abdomen: Visualized upper abdomen is notable for vascular
calcifications.

Musculoskeletal: Degenerative changes the thoracic spine.
Exaggerated thoracic kyphosis.

Median sternotomy.
IMPRESSION: 4.1 x 3.6 x 2.3 cm thick-walled cavitary lesion in the right lower
lobe, grossly unchanged, possibly infectious/inflammatory, neoplasm
not excluded.

## 2018-10-19 ENCOUNTER — Encounter: Payer: Self-pay | Admitting: Plastic Surgery

## 2018-10-19 ENCOUNTER — Ambulatory Visit (INDEPENDENT_AMBULATORY_CARE_PROVIDER_SITE_OTHER): Payer: PPO | Admitting: Plastic Surgery

## 2018-10-19 DIAGNOSIS — L989 Disorder of the skin and subcutaneous tissue, unspecified: Secondary | ICD-10-CM | POA: Insufficient documentation

## 2018-10-19 NOTE — Progress Notes (Signed)
Patient ID: Tiffany Caldwell, female    DOB: 01/30/1941, 78 y.o.   MRN: [id number]   Chief Complaint  Patient presents with  . Advice Only    for spot removal on face    The patient is a 78-year-ol female here for evaluation of 2 changing skin lesions.  The patient has not had skin cancer in the past.  Her mother has had basal cell carcinoma.  There is no family history of melanoma.  The patient was referred by Dr. Dessie Coma.  She noticed a changing skin lesion on the left cheek has been getting larger.  It is 4 x 5 mm.  She knows its been there for the past several years.  She states that now it gets dry and scabby to the point of being able to take skin off of it.  It is slightly hypopigmented and slightly raised with some mild areas of redness.  Is difficult to tell what it is.  The lesion on her right cheek is raised flesh color in nature and 2 x 5 mm in size.  Nothing makes either of them better and they seem to be getting worse with time.   Review of Systems  Constitutional: Negative.  Negative for activity change.  HENT: Negative.   Eyes: Negative.   Respiratory: Negative.   Gastrointestinal: Negative.   Endocrine: Negative.   Genitourinary: Negative.   Musculoskeletal: Negative.   Skin: Negative for color change and wound.  Neurological: Negative.   Psychiatric/Behavioral: Negative.     Past Medical History:  Diagnosis Date  . Allergy   . Arthritis   . Breast cancer ([place])    left breast  . Coronary artery disease 2001   CABG  . DDD (degenerative disc disease), lumbosacral   . Dyspnea    if rushing or climmbing lots of stairs  . Emphysema lung ([facility])    emphysema  . GERD (gastroesophageal reflux disease)    11/20/16- no a current problem  . Hyperlipidemia   . Hypertension   . Irritable bowel syndrome (IBS)   . Lung disorder    leison  . Neuropathy   . Pinched nerve    back and left leg  . Pneumonia 2016    Past Surgical History:  Procedure Laterality  Date  . BREAST FIBROADENOMA SURGERY  01/13/1980  . BREAST SURGERY  02/01/2004   tram/mastectomyfor recurrence  . CARPOMETACARPEL SUSPENSION PLASTY Right 12/17/2017   Procedure: SUSPENSIONPLASTY RIGHT THUMB WITH EXCISION TRAPEZIUM;  Surgeon: Daryll Brod, MD;  Location: [place];  Service: Orthopedics;  Laterality: Right;  . COLONOSCOPY    . CORONARY ANGIOPLASTY  2001   had tear to two vessels- had to have open heart surgery  . CORONARY ARTERY BYPASS GRAFT  2001  . EYE SURGERY Bilateral    Cataract  . KNEE ARTHROSCOPY  January 2011   right  . MASTECTOMY PARTIAL / LUMPECTOMY W/ AXILLARY LYMPHADENECTOMY  04/10/1989   Dr Annamaria Boots / left breast  . PARTIAL HYSTERECTOMY     vaginal  . TENDON TRANSFER Right 12/17/2017   Procedure: ABDUCTOR POLLICIS LONGUS TRANSFER;  Surgeon: Daryll Brod, MD;  Location: [place];  Service: Orthopedics;  Laterality: Right;  . TONSILLECTOMY AND ADENOIDECTOMY    . TOTAL HIP ARTHROPLASTY Right 08/05/2016   Procedure: TOTAL HIP ARTHROPLASTY ANTERIOR APPROACH;  Surgeon: [name], MD;  Location: [facility];  Service: Orthopedics;  Laterality: Right;  . UPPER GASTROINTESTINAL ENDOSCOPY    . VIDEO  BRONCHOSCOPY WITH ENDOBRONCHIAL NAVIGATION N/A 11/27/2016   Procedure: VIDEO BRONCHOSCOPY WITH ENDOBRONCHIAL NAVIGATION;  Surgeon: [name], MD;  Location: [facility];  Service: Thoracic;  Laterality: N/A;      Current Outpatient Medications:  .  acetaminophen (TYLENOL) 500 MG tablet, Take 1,000 mg by mouth 2 (two) times daily. , Disp: , Rfl:  .  aspirin EC 81 MG tablet, Take 81 mg by mouth daily., Disp: , Rfl:  .  atorvastatin (LIPITOR) 20 MG tablet, TAKE 1 TABLET BY MOUTH DAILY, Disp: 90 tablet, Rfl: 0 .  Biotin 10000 MCG TABS, Take 1 tablet by mouth daily., Disp: , Rfl:  .  Calcium Carbonate-Vitamin D (CALCIUM 600 + D PO), Take 1 tablet by mouth at bedtime. , Disp: , Rfl:  .  Carboxymethylcell-Hypromellose (GENTEAL OP), Place 1 drop into  both eyes at bedtime., Disp: , Rfl:  .  cetirizine (ZYRTEC) 10 MG tablet, Take 10 mg by mouth daily., Disp: , Rfl:  .  fish oil-omega-3 fatty acids 1000 MG capsule, Take 1 g by mouth 2 (two) times daily. , Disp: , Rfl:  .  gabapentin (NEURONTIN) 300 MG capsule, Take 300 mg by mouth 4 (four) times daily. , Disp: , Rfl:  .  Glucosamine-Chondroit-Vit C-Mn (GLUCOSAMINE CHONDR 1500 COMPLX PO), Take 1 tablet by mouth 2 (two) times daily., Disp: , Rfl:  .  hyoscyamine (LEVBID) 0.375 MG 12 hr tablet, Take 0.375 mg by mouth every 12 (twelve) hours as needed for cramping (IBS). , Disp: , Rfl:  .  Multiple Vitamin (MULTIVITAMIN) tablet, Take 1 tablet by mouth daily.  , Disp: , Rfl:  .  propranolol (INDERAL) 20 MG tablet, Take 40 mg by mouth 2 (two) times daily., Disp: , Rfl:  .  valsartan-hydrochlorothiazide (DIOVAN-HCT) 80-12.5 MG per tablet, Take 1 tablet by mouth daily.  , Disp: , Rfl:    Objective:   Vitals:   10/19/18 1354  BP: 120/62  Pulse: 60  Temp: 97.6 F (36.4 C)  SpO2: 95%    Physical Exam Vitals signs and nursing note reviewed.  Constitutional:      Appearance: Normal appearance.  HENT:     Head: Normocephalic and atraumatic.  Cardiovascular:     Rate and Rhythm: Normal rate.  Pulmonary:     Effort: Pulmonary effort is normal.  Skin:    General: Skin is warm.  Neurological:     Mental Status: She is alert.  Psychiatric:        Mood and Affect: Mood normal.        Thought Content: Thought content normal.        Judgment: Judgment normal.     Assessment & Plan:  Changing skin lesion Recommend biopsy of both areas and definitive treatment based on pathology Pershing, DO

## 2018-10-20 DIAGNOSIS — M542 Cervicalgia: Secondary | ICD-10-CM | POA: Diagnosis not present

## 2018-10-28 DIAGNOSIS — M47892 Other spondylosis, cervical region: Secondary | ICD-10-CM | POA: Diagnosis not present

## 2018-11-01 DIAGNOSIS — M47892 Other spondylosis, cervical region: Secondary | ICD-10-CM | POA: Diagnosis not present

## 2018-11-03 DIAGNOSIS — M47892 Other spondylosis, cervical region: Secondary | ICD-10-CM | POA: Diagnosis not present

## 2018-11-16 DIAGNOSIS — M47892 Other spondylosis, cervical region: Secondary | ICD-10-CM | POA: Diagnosis not present

## 2018-11-18 DIAGNOSIS — M47892 Other spondylosis, cervical region: Secondary | ICD-10-CM | POA: Diagnosis not present

## 2018-11-23 DIAGNOSIS — M47892 Other spondylosis, cervical region: Secondary | ICD-10-CM | POA: Diagnosis not present

## 2018-11-25 ENCOUNTER — Other Ambulatory Visit: Payer: Self-pay

## 2018-11-25 MED ORDER — ATORVASTATIN CALCIUM 20 MG PO TABS: 20.0000 mg | ORAL_TABLET | Freq: Every day | ORAL | 0 refills | Status: DC

## 2018-11-30 DIAGNOSIS — M47892 Other spondylosis, cervical region: Secondary | ICD-10-CM | POA: Diagnosis not present

## 2018-12-01 DIAGNOSIS — M542 Cervicalgia: Secondary | ICD-10-CM | POA: Diagnosis not present

## 2018-12-07 DIAGNOSIS — M47892 Other spondylosis, cervical region: Secondary | ICD-10-CM | POA: Diagnosis not present

## 2018-12-09 DIAGNOSIS — M47892 Other spondylosis, cervical region: Secondary | ICD-10-CM | POA: Diagnosis not present

## 2018-12-14 DIAGNOSIS — M47892 Other spondylosis, cervical region: Secondary | ICD-10-CM | POA: Diagnosis not present

## 2018-12-16 DIAGNOSIS — M47892 Other spondylosis, cervical region: Secondary | ICD-10-CM | POA: Diagnosis not present

## 2018-12-28 ENCOUNTER — Ambulatory Visit: Payer: PPO | Admitting: Plastic Surgery

## 2019-01-31 DIAGNOSIS — Z853 Personal history of malignant neoplasm of breast: Secondary | ICD-10-CM | POA: Diagnosis not present

## 2019-01-31 DIAGNOSIS — M19039 Primary osteoarthritis, unspecified wrist: Secondary | ICD-10-CM | POA: Diagnosis not present

## 2019-01-31 DIAGNOSIS — I25810 Atherosclerosis of coronary artery bypass graft(s) without angina pectoris: Secondary | ICD-10-CM | POA: Diagnosis not present

## 2019-01-31 DIAGNOSIS — I10 Essential (primary) hypertension: Secondary | ICD-10-CM | POA: Diagnosis not present

## 2019-01-31 DIAGNOSIS — E7800 Pure hypercholesterolemia, unspecified: Secondary | ICD-10-CM | POA: Diagnosis not present

## 2019-01-31 DIAGNOSIS — J449 Chronic obstructive pulmonary disease, unspecified: Secondary | ICD-10-CM | POA: Diagnosis not present

## 2019-02-20 ENCOUNTER — Other Ambulatory Visit: Payer: Self-pay | Admitting: Internal Medicine

## 2019-03-16 ENCOUNTER — Institutional Professional Consult (permissible substitution): Payer: PPO | Admitting: Emergency Medicine

## 2019-03-17 ENCOUNTER — Encounter: Payer: Self-pay | Admitting: Emergency Medicine

## 2019-03-17 ENCOUNTER — Other Ambulatory Visit: Payer: Self-pay

## 2019-03-17 ENCOUNTER — Ambulatory Visit: Payer: PPO | Admitting: Emergency Medicine

## 2019-03-17 DIAGNOSIS — J449 Chronic obstructive pulmonary disease, unspecified: Secondary | ICD-10-CM

## 2019-03-17 DIAGNOSIS — A310 Pulmonary mycobacterial infection: Secondary | ICD-10-CM

## 2019-03-17 MED ORDER — STIOLTO RESPIMAT 2.5-2.5 MCG/ACT IN AERS: 2.0000 | INHALATION_SPRAY | Freq: Every day | RESPIRATORY_TRACT | 0 refills | Status: DC

## 2019-03-17 NOTE — Progress Notes (Signed)
Patient seen in the office today and instructed on use of Stiolto Respimat.  Patient expressed understanding and demonstrated technique.  

## 2019-03-17 NOTE — Progress Notes (Signed)
Subjective:    Patient ID: Tiffany Caldwell, female    DOB: March 11, 1941, 78 y.o.   MRN: [id number]  HPI Tiffany Caldwell is 78, has a history of tobacco use (30 pack years), coronary disease/CABG, breast cancer (treated surgically).  She has been seen in our office before by Dr. Elsworth Soho for COPD and pulmonary nodular disease with a right lower lobe cavitary nodule for which she has undergone [facility] biopsy.  Mycobacterium avium positive.  She is also seen Dr. Cyndia Bent with [facility], Dr. Megan Salon with infectious diseases.  She was clinically well, had an allergy to azithromycin and so [facility] therapy has been deferred to date.  Her most recent CT chest was 06/09/2018 which I reviewed, shows irregular cavitary right superior segmental mass 3.8 cm.  Some internal calcification.  Stable mild bronchiectasis especially in the right middle lobe, lingula.  Moderate to severe emphysema.  PFT 04/03/2015 reviewed, show moderately severe obstruction without a bronchodilator response, coexisting restriction on lung volumes, decreased diffusion capacity that does not fully correct when adjusted regular volume.  She is here today to re-establish care, evaluate progressive dyspnea over the last 6 months. She has experienced fatigue with rushing, climbing  2-3 flights stairs. She is able to do her shopping. No wheeze, no cough or chest tightness.  Allergies do bother her, have been stable on her current regimen.   Not currently on any bronchodilator therapy.   Review of Systems  Respiratory: Positive for shortness of breath.   Cardiovascular:       Irregular heartbeats  Musculoskeletal: Positive for arthralgias.   Past Medical History:  Diagnosis Date  . Allergy   . Arthritis   . Breast cancer ([place])    left breast  . Coronary artery disease 2001   CABG  . DDD (degenerative disc disease), lumbosacral   . Dyspnea    if rushing or climmbing lots of stairs  . Emphysema lung ([facility])    emphysema  . GERD (gastroesophageal reflux  disease)    11/20/16- no a current problem  . Hyperlipidemia   . Hypertension   . Irritable bowel syndrome (IBS)   . Lung disorder    leison  . Neuropathy   . Pinched nerve    back and left leg  . Pneumonia 2016     Family History  Problem Relation Age of Onset  . Emphysema Father        deceased  . Heart disease Mother        deceased  . Colon cancer Paternal Grandmother   . Colon cancer Son   . Colon cancer Paternal Uncle      Social History   Socioeconomic History  . Marital status: Married    Spouse name: Not on file  . Number of children: Not on file  . Years of education: Not on file  . Highest education level: Not on file  Occupational History  . Not on file  Social Needs  . Financial resource strain: Not on file  . Food insecurity    Worry: Not on file    Inability: Not on file  . Transportation needs    Medical: Not on file    Non-medical: Not on file  Tobacco Use  . Smoking status: Former Smoker    Years: 30.00    Types: Cigarettes    Quit date: 07/31/1989    Years since quitting: 29.6  . Smokeless tobacco: Never Used  Substance and Sexual Activity  . Alcohol use: Yes  Alcohol/week: 1.0 standard drinks    Types: 1 Glasses of wine per week    Comment: occasional wine  . Drug use: No  . Sexual activity: Not on file  Lifestyle  . Physical activity    Days per week: Not on file    Minutes per session: Not on file  . Stress: Not on file  Relationships  . Social Herbalist on phone: Not on file    Gets together: Not on file    Attends religious service: Not on file    Active member of club or organization: Not on file    Attends meetings of clubs or organizations: Not on file    Relationship status: Not on file  . Intimate partner violence    Fear of current or ex partner: Not on file    Emotionally abused: Not on file    Physically abused: Not on file    Forced sexual activity: Not on file  Other Topics Concern  . Not on file   Social History Narrative  . Not on file  She worked as a Barrister's clerk for many years.   Allergies  Allergen Reactions  . Hydromorphone Other (See Comments)    ALTERED MENTAL STATUS Altered mental status  . Azithromycin Rash  . Ciprofloxacin Nausea Only  . Hydromorphone Hcl Other (See Comments)    Altered mental status Altered mental status  . Levofloxacin Nausea Only  . Lisinopril Other (See Comments)    Fatigue  Fatigue     Outpatient Medications Prior to Visit  Medication Sig Dispense Refill  . acetaminophen (TYLENOL) 500 MG tablet Take 1,000 mg by mouth 2 (two) times daily.     Marland Kitchen aspirin EC 81 MG tablet Take 81 mg by mouth daily.    Marland Kitchen atorvastatin (LIPITOR) 20 MG tablet TAKE 1 TABLET BY MOUTH EVERY DAY 90 tablet 0  . Biotin 10000 MCG TABS Take 1 tablet by mouth daily.    . Calcium Carbonate-Vitamin D (CALCIUM 600 + D PO) Take 1 tablet by mouth at bedtime.     . Carboxymethylcell-Hypromellose (GENTEAL OP) Place 1 drop into both eyes at bedtime.    . fexofenadine (ALLEGRA) 180 MG tablet Take 180 mg by mouth daily.    . fish oil-omega-3 fatty acids 1000 MG capsule Take 1 g by mouth 2 (two) times daily.     Marland Kitchen gabapentin (NEURONTIN) 300 MG capsule Take 300 mg by mouth 4 (four) times daily.     . Glucosamine-Chondroit-Vit C-Mn (GLUCOSAMINE CHONDR 1500 COMPLX PO) Take 1 tablet by mouth 2 (two) times daily.    . hyoscyamine (LEVBID) 0.375 MG 12 hr tablet Take 0.375 mg by mouth every 12 (twelve) hours as needed for cramping (IBS).     . Multiple Vitamin (MULTIVITAMIN) tablet Take 1 tablet by mouth daily.      . valsartan-hydrochlorothiazide (DIOVAN-HCT) 80-12.5 MG per tablet Take 1 tablet by mouth daily.      . cetirizine (ZYRTEC) 10 MG tablet Take 10 mg by mouth daily.    . propranolol (INDERAL) 20 MG tablet Take 40 mg by mouth 2 (two) times daily.     No facility-administered medications prior to visit.         Objective:   Physical Exam Vitals:   03/17/19 1431  BP: 116/70   Pulse: 71  Temp: (!) 97.5 F (36.4 C)  TempSrc: Oral  SpO2: 94%  Weight: 142 lb 3.2 oz (64.5 kg)  Height: 5' 3.25" (  1.607 m)   Gen: Pleasant, well-nourished, in no distress,  normal affect, moderate kyphosis  ENT: No lesions,  mouth clear,  oropharynx clear, no postnasal drip  Neck: No JVD, very soft inspiratory and expiratory stridor  Lungs: No use of accessory muscles, distant, no crackles or wheezing on normal respiration, no wheeze on forced expiration  Cardiovascular: RRR, heart sounds normal, no murmur or gallops, no peripheral edema  Musculoskeletal: No deformities, no cyanosis or clubbing  Neuro: alert, awake, non focal  Skin: Warm, no lesions or rash      Assessment & Plan:  COPD (chronic obstructive pulmonary disease) Based on the prior PFT and her CT chest with emphysematous changes suspect that her progressive dyspnea is due to COPD.  We will plan to repeat her pulmonary function testing to assess the degree of change in her FEV1, but in the meantime I think we should start her on a therapeutic trial.  Start Stiolto to see if she gets benefit.  We will assess in about 1 month.  Pulmonary Mycobacterium avium infection Micronodular and macronodular disease on CT chest.  Necrotic tissue on biopsy, at least once positive for Mycobacterium avium.  Have been following clinically and with serial CT scans as arranged by Dr. Cyndia Bent,   Baltazar Apo, MD, PhD 03/17/2019, 3:03 PM [facility] [phone] or if no answer [phone]

## 2019-03-17 NOTE — Assessment & Plan Note (Signed)
Micronodular and macronodular disease on CT chest.  Necrotic tissue on biopsy, at least once positive for Mycobacterium avium.  Have been following clinically and with serial CT scans as arranged by Dr. Cyndia Bent,

## 2019-03-17 NOTE — Assessment & Plan Note (Signed)
Based on the prior PFT and her CT chest with emphysematous changes suspect that her progressive dyspnea is due to COPD.  We will plan to repeat her pulmonary function testing to assess the degree of change in her FEV1, but in the meantime I think we should start her on a therapeutic trial.  Start Stiolto to see if she gets benefit.  We will assess in about 1 month.

## 2019-03-17 NOTE — Patient Instructions (Signed)
We will do a trial of Stiolto 2 puffs once a day to see if this helps your breathing. If so then we will order it through your pharmacy.  Follow with Dr Lamonte Sakai or APP in 1 month to discuss You will need to have your CT chest repeated in September as per Dr Vivi Martens plans.

## 2019-04-04 DIAGNOSIS — M25512 Pain in left shoulder: Secondary | ICD-10-CM | POA: Diagnosis not present

## 2019-04-06 ENCOUNTER — Telehealth: Payer: Self-pay | Admitting: Primary Care

## 2019-04-06 NOTE — Telephone Encounter (Signed)
Called and spoke with pt who stated she used the first sample of Stiolto and after the first sample of the [employer], she began having a scratchy throat and due to this, pt was wanting to clear her throat all the time.  Due to this, pt has not started the second Stiolto sample that was given to her.   Pt wants to know what we recommended for her before she did begin using the second Stiolto inhaler sample. Dr. Lamonte Sakai, please advise on this for pt. Thank you!

## 2019-04-06 NOTE — Telephone Encounter (Signed)
Call returned to patient, made aware of RB recommendations. Voiced understanding. She states she will give it a rest and restart in a week and call back with an update then. Nothing further is needed at this time.

## 2019-04-06 NOTE — Telephone Encounter (Signed)
Unfortunately there is no way to use a spacer for stiolto. Have her take a break for 4-5 days and then try restarting the stiolto. If she has the same side effect then we will probably have to choose another inhaler.

## 2019-04-19 ENCOUNTER — Encounter: Payer: Self-pay | Admitting: General Surgery

## 2019-04-19 ENCOUNTER — Encounter: Payer: Self-pay | Admitting: Primary Care

## 2019-04-19 ENCOUNTER — Ambulatory Visit: Payer: PPO | Admitting: Primary Care

## 2019-04-19 ENCOUNTER — Other Ambulatory Visit: Payer: Self-pay

## 2019-04-19 VITALS — BP 130/70 | HR 93 | Temp 98.6°F | Ht 65.0 in | Wt 141.6 lb

## 2019-04-19 DIAGNOSIS — J449 Chronic obstructive pulmonary disease, unspecified: Secondary | ICD-10-CM | POA: Diagnosis not present

## 2019-04-19 DIAGNOSIS — R911 Solitary pulmonary nodule: Secondary | ICD-10-CM

## 2019-04-19 MED ORDER — STIOLTO RESPIMAT 2.5-2.5 MCG/ACT IN AERS: 2.0000 | INHALATION_SPRAY | Freq: Every day | RESPIRATORY_TRACT | 0 refills | Status: DC

## 2019-04-19 NOTE — Patient Instructions (Addendum)
Check with insurance and see if Anoro or Tiffany Caldwell is covered (LABA/LAMA class of medication) Continue Stiolto until we either get patient assistance for medication or we can change to another inhaler  Follow up: 3-4 month with Dr. Lamonte Sakai OR sooner if needed

## 2019-04-19 NOTE — Assessment & Plan Note (Addendum)
-   Improvement in dyspnea since starting Stiolto, however, this is not covered by insurance. Patient needs to be on LABA/LAMA so Advair/Breo not my first choice. Recommend patient check with her insurance plan to see if Anoro or Bevespi are covered  - Continue Stiolto until we either get patient assistance for medication or we can change to another inhaler  - FU 3-4 month with Dr. Lamonte Sakai

## 2019-04-19 NOTE — Progress Notes (Signed)
@Patient  ID: Tiffany Caldwell, female    DOB: 1941/08/21, 78 y.o.   MRN: [id number]  Chief Complaint  Patient presents with  . Follow-up    COPD - Stiolto worked, but Insurance underwriter will not cover it. Alternatives are Breo or Advair.    Referring provider: Lavone Orn, MD  HPI: 78 year old female, former smoker. PMH significant for COPD with emphysema, pulmonary mycobacterium avium infection, solitary pulmonary nodules, chronic rhinosinusitis, HTN, CAD, GERD, diverticulosis, IBS, neuropathy, breast cancer. Previous patient of Dr. Elsworth Soho who had not been seen since 2017. Recently seen for consult by Dr. Lamonte Sakai on 03/17/19. Started on trial [employer]. Needs repeat CT chest in September.   04/19/2019 Patient presents today for 1 month follow-up visit. She is doing well, no acute complaints. Feels her dyspnea has improved since starting Stiolto, states that she doesn't seem to have as much difficulty going up her steps. If she needs to make repeated trips up/down stairs she gets winded. She has been doing more housework for the last 4-6 weeks because her cleaner tested positive COVID. She did not have contact with her since testing positive. Denies fever, cough or wheezing.   Allergies  Allergen Reactions  . Hydromorphone Other (See Comments)    ALTERED MENTAL STATUS Altered mental status  . Azithromycin Rash  . Ciprofloxacin Nausea Only  . Hydromorphone Hcl Other (See Comments)    Altered mental status Altered mental status  . Levofloxacin Nausea Only  . Lisinopril Other (See Comments)    Fatigue  Fatigue    Immunization History  Administered Date(s) Administered  . Influenza,inj,Quad PF,6+ Mos 07/26/2015  . Influenza-Unspecified 08/15/2014  . Pneumococcal-Unspecified 06/15/2014  . Zoster Recombinat (Shingrix) 03/20/2018, 08/22/2018    Past Medical History:  Diagnosis Date  . Allergy   . Arthritis   . Breast cancer ([place])    left breast  . Coronary artery disease 2001   CABG   . DDD (degenerative disc disease), lumbosacral   . Dyspnea    if rushing or climmbing lots of stairs  . Emphysema lung ([facility])    emphysema  . GERD (gastroesophageal reflux disease)    11/20/16- no a current problem  . Hyperlipidemia   . Hypertension   . Irritable bowel syndrome (IBS)   . Lung disorder    leison  . Neuropathy   . Pinched nerve    back and left leg  . Pneumonia 2016    Tobacco History: Social History   Tobacco Use  Smoking Status Former Smoker  . Years: 30.00  . Types: Cigarettes  . Quit date: 07/31/1989  . Years since quitting: 29.7  Smokeless Tobacco Never Used   Counseling given: Not Answered   Outpatient Medications Prior to Visit  Medication Sig Dispense Refill  . acetaminophen (TYLENOL) 500 MG tablet Take 1,000 mg by mouth 2 (two) times daily.     Marland Kitchen aspirin EC 81 MG tablet Take 81 mg by mouth daily.    Marland Kitchen atorvastatin (LIPITOR) 20 MG tablet TAKE 1 TABLET BY MOUTH EVERY DAY 90 tablet 0  . Biotin 10000 MCG TABS Take 1 tablet by mouth daily.    . Calcium Carbonate-Vitamin D (CALCIUM 600 + D PO) Take 1 tablet by mouth at bedtime.     . Carboxymethylcell-Hypromellose (GENTEAL OP) Place 1 drop into both eyes at bedtime.    . fexofenadine (ALLEGRA) 180 MG tablet Take 180 mg by mouth daily.    . fish oil-omega-3 fatty acids 1000 MG capsule Take 1  g by mouth 2 (two) times daily.     Marland Kitchen gabapentin (NEURONTIN) 300 MG capsule Take 300 mg by mouth 4 (four) times daily.     . Glucosamine-Chondroit-Vit C-Mn (GLUCOSAMINE CHONDR 1500 COMPLX PO) Take 1 tablet by mouth 2 (two) times daily.    . hyoscyamine (LEVBID) 0.375 MG 12 hr tablet Take 0.375 mg by mouth every 12 (twelve) hours as needed for cramping (IBS).     . Multiple Vitamin (MULTIVITAMIN) tablet Take 1 tablet by mouth daily.      . valsartan-hydrochlorothiazide (DIOVAN-HCT) 80-12.5 MG per tablet Take 1 tablet by mouth daily.      . Tiotropium Bromide-Olodaterol (STIOLTO RESPIMAT) 2.5-2.5 MCG/ACT AERS Inhale 2  puffs into the lungs daily. (Patient not taking: Reported on 04/19/2019) 2 Inhaler 0   No facility-administered medications prior to visit.     Review of Systems  Review of Systems  Constitutional: Negative.   HENT: Negative.   Respiratory: Negative for cough, shortness of breath and wheezing.   Cardiovascular: Negative.     Physical Exam  BP 130/70 (BP Location: Left Arm, Patient Position: Sitting, Cuff Size: Normal)   Pulse 93   Temp 98.6 F (37 C)   Ht 5\' 5"  (1.651 m)   Wt 141 lb 9.6 oz (64.2 kg)   LMP  (LMP Unknown)   SpO2 95%   BMI 23.56 kg/m  Physical Exam Constitutional:      Appearance: Normal appearance.  HENT:     Right Ear: Tympanic membrane normal.     Left Ear: Tympanic membrane normal.     Mouth/Throat:     Mouth: Mucous membranes are moist.     Pharynx: Oropharynx is clear.  Cardiovascular:     Rate and Rhythm: Normal rate and regular rhythm.  Pulmonary:     Effort: Pulmonary effort is normal.     Breath sounds: Normal breath sounds.  Neurological:     Mental Status: She is alert.      Lab Results:  CBC    Component Value Date/Time   WBC 3.6 (L) 11/20/2016 1549   RBC 4.23 11/20/2016 1549   HGB 13.3 11/20/2016 1549   HCT 41.6 11/20/2016 1549   PLT 149 (L) 11/20/2016 1549   MCV 98.3 11/20/2016 1549   MCH 31.4 11/20/2016 1549   MCHC 32.0 11/20/2016 1549   RDW 13.3 11/20/2016 1549   LYMPHSABS 1.2 07/28/2016 1045   MONOABS 0.3 07/28/2016 1045   EOSABS 0.1 07/28/2016 1045   BASOSABS 0.0 07/28/2016 1045    BMET    Component Value Date/Time   NA 140 11/20/2016 1549   K 4.8 11/20/2016 1549   CL 98 (L) 11/20/2016 1549   CO2 30 11/20/2016 1549   GLUCOSE 111 (H) 11/20/2016 1549   BUN 24 (H) 11/20/2016 1549   CREATININE 1.14 (H) 11/20/2016 1549   CALCIUM 11.7 (H) 11/20/2016 1549   GFRNONAA 45 (L) 11/20/2016 1549   GFRAA 52 (L) 11/20/2016 1549    BNP No results found for: BNP  ProBNP No results found for: PROBNP  Imaging: No  results found.   Assessment & Plan:   COPD (chronic obstructive pulmonary disease) - Improvement in dyspnea since starting Stiolto, however, this is not covered by insurance. Patient needs to be on LABA/LAMA so Advair/Breo not my first choice. Recommend patient check with her insurance plan to see if Anoro or Bevespi are covered  - Continue Stiolto until we either get patient assistance for medication or we can change to  another inhaler  - FU 3-4 month with Dr. Lamonte Sakai  Solitary pulmonary nodule - CT chest 05/2017 showed stable thick walled cavitary structure within the superior segment of right lower lobe 4.1 x 3.5 cm. New subpleural nodule within the right upper lobe measures 7 mm and is nonspecific - Needs repeat CT chest in September with Dr. Cyndia Bent (results to be faxed to our office)   Martyn Ehrich, NP 04/19/2019

## 2019-04-19 NOTE — Assessment & Plan Note (Addendum)
-   CT chest 05/2017 showed stable thick walled cavitary structure within the superior segment of right lower lobe 4.1 x 3.5 cm. New subpleural nodule within the right upper lobe measures 7 mm and is nonspecific - Needs repeat CT chest in September with Dr. Cyndia Bent (results to be faxed to our office)

## 2019-04-20 ENCOUNTER — Telehealth: Payer: Self-pay | Admitting: Primary Care

## 2019-04-20 MED ORDER — UMECLIDINIUM-VILANTEROL 62.5-25 MCG/INH IN AEPB: 1.0000 | INHALATION_SPRAY | Freq: Every day | RESPIRATORY_TRACT | 6 refills | Status: DC

## 2019-04-20 NOTE — Telephone Encounter (Signed)
Sent RX to pharmacy. Have patient finish stiolto and then start Anoro 1 puff daily.

## 2019-04-20 NOTE — Telephone Encounter (Signed)
  Pt calling back to inform that Anoro is covered by her insurance. Would you like rx for Anoro sent to pharmacy?      Assessment & Plan:   COPD (chronic obstructive pulmonary disease) - Improvement in dyspnea since starting Stiolto, however, this is not covered by insurance. Patient needs to be on LABA/LAMA so Advair/Breo not my first choice. Recommend patient check with her insurance plan to see if Anoro or Bevespi are covered  - Continue Stiolto until we either get patient assistance for medication or we can change to another inhaler  - FU 3-4 month with Dr. Lamonte Sakai

## 2019-04-20 NOTE — Telephone Encounter (Signed)
Called and spoke with pt letting her know that Rx for Anoro was sent to pharmacy. Stated to her to finish up the stiolto and then begin on anoro and pt verbalized understanding. Nothing further needed.

## 2019-04-28 DIAGNOSIS — E7800 Pure hypercholesterolemia, unspecified: Secondary | ICD-10-CM | POA: Diagnosis not present

## 2019-04-28 DIAGNOSIS — I10 Essential (primary) hypertension: Secondary | ICD-10-CM | POA: Diagnosis not present

## 2019-04-28 DIAGNOSIS — M19039 Primary osteoarthritis, unspecified wrist: Secondary | ICD-10-CM | POA: Diagnosis not present

## 2019-04-28 DIAGNOSIS — Z853 Personal history of malignant neoplasm of breast: Secondary | ICD-10-CM | POA: Diagnosis not present

## 2019-04-28 DIAGNOSIS — J449 Chronic obstructive pulmonary disease, unspecified: Secondary | ICD-10-CM | POA: Diagnosis not present

## 2019-04-28 DIAGNOSIS — I25810 Atherosclerosis of coronary artery bypass graft(s) without angina pectoris: Secondary | ICD-10-CM | POA: Diagnosis not present

## 2019-05-04 DIAGNOSIS — M25512 Pain in left shoulder: Secondary | ICD-10-CM | POA: Diagnosis not present

## 2019-05-13 ENCOUNTER — Other Ambulatory Visit: Payer: Self-pay | Admitting: Surgery

## 2019-05-13 DIAGNOSIS — R911 Solitary pulmonary nodule: Secondary | ICD-10-CM

## 2019-05-15 ENCOUNTER — Other Ambulatory Visit: Payer: Self-pay | Admitting: Internal Medicine

## 2019-05-31 DIAGNOSIS — Z1231 Encounter for screening mammogram for malignant neoplasm of breast: Secondary | ICD-10-CM | POA: Diagnosis not present

## 2019-05-31 DIAGNOSIS — Z853 Personal history of malignant neoplasm of breast: Secondary | ICD-10-CM | POA: Diagnosis not present

## 2019-06-06 ENCOUNTER — Telehealth: Payer: Self-pay | Admitting: Emergency Medicine

## 2019-06-06 NOTE — Telephone Encounter (Signed)
Yes I think she should do the rinsing / gargling, see how her breathing does. Should be able to assess her improvement after 2 weeks. If no better at that time then we should revisit

## 2019-06-06 NOTE — Telephone Encounter (Signed)
Called spoke with patient who reported that she is beginning to experience the same symptoms with Anoro (has taken 45 days days' worth of Anoro) that she had with Stiolto >> scratchy throat and increased throat clearing.  (see 7.15.2020 phone note)  Asked patient if she has been rinsing her mouth after each use and she reports that she was unaware of this.  Discussed with patient in detail on brushing/rinsing/gargling after each use.  Patient will begin this tomorrow.  Patient also stated that she has noticed some dyspnea going up her flight of stairs x1 time within her home - this is new and typically would take "a few trips" before she would begin having some dyspnea.  Patient would like to know if she should continue the Anoro WITH brushing/rinsing/gargling and perhaps the Anoro will "kick in"?  Dr Lamonte Sakai please advise, thank you.

## 2019-06-06 NOTE — Telephone Encounter (Signed)
Spoke with patient. She verbalized understanding. Nothing further needed at time of call.  

## 2019-06-15 ENCOUNTER — Other Ambulatory Visit: Payer: PPO

## 2019-06-15 ENCOUNTER — Ambulatory Visit: Payer: PPO | Admitting: Surgery

## 2019-06-22 ENCOUNTER — Other Ambulatory Visit: Payer: Self-pay

## 2019-06-22 ENCOUNTER — Ambulatory Visit: Payer: PPO | Admitting: Surgery

## 2019-06-22 ENCOUNTER — Ambulatory Visit
Admission: RE | Admit: 2019-06-22 | Discharge: 2019-06-22 | Disposition: A | Discharge status: Home / Self Care | Payer: PPO | Source: Ambulatory Visit | Attending: Surgery | Admitting: Surgery

## 2019-06-22 ENCOUNTER — Encounter: Payer: Self-pay | Admitting: Surgery

## 2019-06-22 VITALS — BP 118/64 | HR 70 | Temp 97.7°F | Resp 16 | Ht 65.0 in | Wt 140.0 lb

## 2019-06-22 DIAGNOSIS — J438 Other emphysema: Secondary | ICD-10-CM | POA: Diagnosis not present

## 2019-06-22 DIAGNOSIS — R911 Solitary pulmonary nodule: Secondary | ICD-10-CM

## 2019-06-22 DIAGNOSIS — R918 Other nonspecific abnormal finding of lung field: Secondary | ICD-10-CM

## 2019-06-22 NOTE — Progress Notes (Signed)
HPI:  The patient returns today for follow up of a persistent right lower lobe lung cavitary lesion.It hadbeen biopsied twice and showed no malignancy. The cultures from the initial biopsy did grow MAI but ID decided not to treat her since she was asymptomatic and was allergic to Macrolide antibiotics. ACT on 12/18/2015 showed increased wall thickness throughout the cavitary lung mass with new nodular peribronchovascular interstitial thickening extending from the mass to the right hilum with no change in the overall size of the lesion. She underwent ENB with brushings and biopsies by Dr. Roxan Hockey in 11/2016 and these were all negative for malignancy.The biopsies showed no malignancy, only benign lung with cell necrosis, inflammation and calcifications. The cytology was negative for malignancy. The stains showed no fungus or AFB and the cultureswere negative.  When I saw her on 06/10/2017 a CT scan of the chest showed the cavitary lesion in the right lower lobe was stable.  There was a new 7 mm subpleural nodule in the right upper lobe that I thought was probably inflammatory or infectious.  Last saw 06/09/2018 and CT scan at that time showed the irregular cavitary lesion in the right lower lobe superior segment was slightly decreased in size to about 3.8 cm.  There are numerous faint internal calcifications most compatible with chronic granulomatous infection.  Scattered pulmonary nodules in the right lung overall stable to slightly decreased in size.  It was felt these findings are most suggestive of chronic atypical mycobacterial infection although her previous cultures were negative.  We decided to continue following this with a repeat scan in 1 year.  She continues to feel fairly well although she has had some shortness of breath with exertion that improved with some pulmonary medication changes.  She has been seeing Dr. Lamonte Sakai.  He denies any cough or sputum production.  She has had no fever or  chills.  Her weight has been stable.  Current Outpatient Medications  Medication Sig Dispense Refill   acetaminophen (TYLENOL) 500 MG tablet Take 1,000 mg by mouth 2 (two) times daily.      aspirin EC 81 MG tablet Take 81 mg by mouth daily.     atorvastatin (LIPITOR) 20 MG tablet TAKE 1 TABLET BY MOUTH EVERY DAY 90 tablet 0   Biotin 10000 MCG TABS Take 1 tablet by mouth daily.     Calcium Carbonate-Vitamin D (CALCIUM 600 + D PO) Take 1 tablet by mouth at bedtime.      Carboxymethylcell-Hypromellose (GENTEAL OP) Place 1 drop into both eyes at bedtime.     fexofenadine (ALLEGRA) 180 MG tablet Take 180 mg by mouth daily.     fish oil-omega-3 fatty acids 1000 MG capsule Take 1 g by mouth 2 (two) times daily.      gabapentin (NEURONTIN) 300 MG capsule Take 300 mg by mouth 4 (four) times daily.      Glucosamine-Chondroit-Vit C-Mn (GLUCOSAMINE CHONDR 1500 COMPLX PO) Take 1 tablet by mouth 2 (two) times daily.     hyoscyamine (LEVBID) 0.375 MG 12 hr tablet Take 0.375 mg by mouth every 12 (twelve) hours as needed for cramping (IBS).      Multiple Vitamin (MULTIVITAMIN) tablet Take 1 tablet by mouth daily.       umeclidinium-vilanterol (ANORO ELLIPTA) 62.5-25 MCG/INH AEPB Inhale 1 puff into the lungs daily. 14 each 6   valsartan-hydrochlorothiazide (DIOVAN-HCT) 80-12.5 MG per tablet Take 1 tablet by mouth daily.       No current facility-administered medications  for this visit.      Physical Exam: BP 118/64 (BP Location: Left Arm, Patient Position: Sitting, Cuff Size: Normal)    Pulse 70    Temp 97.7 F (36.5 C)    Resp 16    Ht 5\' 5"  (1.651 m)    Wt 140 lb (63.5 kg)    LMP  (LMP Unknown)    SpO2 95% Comment: RA   BMI 23.30 kg/m  She looks well. There is no cervical or supraclavicular adenopathy. Lungs are clear. Cardiac exam shows regular rate and rhythm with normal heart sounds.  Diagnostic Tests:  CLINICAL DATA:  Follow-up lung nodule. History of left breast cancer. Previous  right lower lobe cavitary lung mass biopsy 02/18/2016 and 11/27/2016 negative for malignancy.  EXAM: CT CHEST WITHOUT CONTRAST  TECHNIQUE: Multidetector CT imaging of the chest was performed following the standard protocol without IV contrast.  COMPARISON:  06/09/2018 and 05/27/2017, 06/25/2016  FINDINGS: Cardiovascular: Heart is normal size. Stent over the left anterior descending coronary artery. Mild calcified plaque over the thoracic aorta. Remaining vascular structures are unremarkable.  Mediastinum/Nodes: No significant mediastinal or hilar adenopathy. Remaining mediastinal structures are unremarkable.  Lungs/Pleura: Lungs are adequately inflated demonstrate moderate centrilobular emphysematous disease. No effusion or acute airspace disease. Biapical pleural thickening is unchanged. Stable 3.5 x 3.6 cm mass over the superior segment right lower lobe which has been biopsied multiple times in the past and shown to be benign. There are several scattered bilateral nodular opacities right worse than left without significant change from 2017. Few areas of distal mucous plugging.  Upper Abdomen: Mild calcified plaque over the abdominal aorta. No acute findings.  Musculoskeletal: Degenerative change of the spine. Median sternotomy wires are present.  IMPRESSION: 1.  No acute findings.  2. Stable biopsy-proven benign mass over the superior segment right lower lobe measuring 3.5 x 3.6 cm. Multiple other smaller bilateral scattered pulmonary nodules without significant change from 2017.  3. Aortic Atherosclerosis (ICD10-I70.0) and Emphysema (ICD10-J43.9). Stent over the left anterior descending coronary artery.   Electronically Signed   By: Marin Olp M.D.   On: 06/22/2019 15:57   Impression:  There has been no significant change in the mass in the superior segment of the right lower lobe which has been biopsied multiple times in the past and shown to  be benign. There are faint internal calcifications most compatible with chronic granulomatous infection. There are multiple other smaller bilateral scattered pulmonary nodules that are unchanged dating back to 2017.  I reviewed the CT images with her and answered her questions.  I have recommended that she have a follow-up CT scan in about 1 year.  She has been seeing Dr. Lamonte Sakai for her pulmonary disease and I think it is fine if he continues to follow this to consolidate her care.  She is in agreement with that.  Plan:  She will continue to follow-up with Dr. Lamonte Sakai and I will be happy to see her back if the need arises.  I spent 15 minutes performing this established patient evaluation and > 50% of this time was spent face to face counseling and coordinating the care of this patient's right lower lobe lung mass.    Gaye Pollack, MD [facility] [phone]

## 2019-07-04 ENCOUNTER — Other Ambulatory Visit: Payer: Self-pay

## 2019-07-04 ENCOUNTER — Encounter: Payer: Self-pay | Admitting: Primary Care

## 2019-07-04 ENCOUNTER — Telehealth: Payer: Self-pay | Admitting: Emergency Medicine

## 2019-07-04 ENCOUNTER — Ambulatory Visit: Payer: PPO | Admitting: Primary Care

## 2019-07-04 DIAGNOSIS — J449 Chronic obstructive pulmonary disease, unspecified: Secondary | ICD-10-CM | POA: Diagnosis not present

## 2019-07-04 DIAGNOSIS — Z853 Personal history of malignant neoplasm of breast: Secondary | ICD-10-CM | POA: Diagnosis not present

## 2019-07-04 DIAGNOSIS — J441 Chronic obstructive pulmonary disease with (acute) exacerbation: Secondary | ICD-10-CM

## 2019-07-04 DIAGNOSIS — M19039 Primary osteoarthritis, unspecified wrist: Secondary | ICD-10-CM | POA: Diagnosis not present

## 2019-07-04 DIAGNOSIS — I10 Essential (primary) hypertension: Secondary | ICD-10-CM | POA: Diagnosis not present

## 2019-07-04 DIAGNOSIS — E7800 Pure hypercholesterolemia, unspecified: Secondary | ICD-10-CM | POA: Diagnosis not present

## 2019-07-04 DIAGNOSIS — I25810 Atherosclerosis of coronary artery bypass graft(s) without angina pectoris: Secondary | ICD-10-CM | POA: Diagnosis not present

## 2019-07-04 MED ORDER — ALBUTEROL SULFATE HFA 108 (90 BASE) MCG/ACT IN AERS: 1.0000 | INHALATION_SPRAY | Freq: Four times a day (QID) | RESPIRATORY_TRACT | 3 refills | Status: DC | PRN

## 2019-07-04 MED ORDER — PREDNISONE 10 MG PO TABS: ORAL_TABLET | ORAL | 0 refills | Status: DC

## 2019-07-04 NOTE — Progress Notes (Signed)
Virtual Visit via Telephone Note  I connected with Tiffany Caldwell on 07/04/19 at  3:00 PM EDT by telephone and verified that I am speaking with the correct person using two identifiers.  Location: Patient: Home  Provider: Office   I discussed the limitations, risks, security and privacy concerns of performing an evaluation and management service by telephone and the availability of in person appointments. I also discussed with the patient that there may be a patient responsible charge related to this service. The patient expressed understanding and agreed to proceed.   History of Present Illness: 78 year old female, former smoker quit in [place] (30 pack year hx). PMH significant for COPD with emphysema, pulmonary mycobacterium avium infection, solitary pulmonary nodules, chronic rhinosinusitis, HTN, CAD, CABG, GERD, diverticulosis, IBS, neuropathy, breast cancer (treated surgically . Previous patient of Dr. Elsworth Soho who had not been seen since 2017. Recently seen for consult by Dr. Lamonte Sakai on 03/17/19. Started on trial [employer]. Needs repeat CT chest in September.   Previous [facility] pulmonary encounter: 04/19/2019 Patient presents today for 1 month follow-up visit. She is doing well, no acute complaints. Feels her dyspnea has improved since starting Stiolto, states that she doesn't seem to have as much difficulty going up her steps. If she needs to make repeated trips up/down stairs she gets winded. She has been doing more housework for the last 4-6 weeks because her cleaner tested positive COVID. She did not have contact with her since testing positive. Denies fever, cough or wheezing. Insurance covers [employer].   07/04/2019 Patient contacted today for acute televisit sob. Reports increased shortness of breath x 3 days, noticed symptoms when she was walking to her husbands hospital room. He was admitted for facial numbness, no signs of covid He is now home. She states that since changing from [employer] to  [employer] she has noticed less throat clearing/cough. She continues Anoro 1 puff daily as prescribed. She does not have a rescue inhaler. States that she has stayed fairly active but with covid she hasn't been able to exercise outside. No known sick contact. Denies fever, cough, wheezing, chest tightness, chest pain, leg swelling or weight changes.   Imaging: 06/22/19 CT chest wo contrast- No acute findings, stable biopsy proven benign mass right lower lobe measuring 3.5x3.6cm . Multiple other small bilateral scattered pulmonary nodules without significant change from 2017. Aortic atherosclerosis and emphysema.   PFTs 2016- FVC 2.17 (75%), FEV1 1.53 (71%), ratio 71, DLCO 47%  Observations/Objective:  - No significant shortness of breath, wheezing or cough noted during phone coversation  Assessment and Plan:  COPD exacerbation  - Increased dyspnea on exertion x 3 days; NO other associated symptoms - RX prednisone 20mg  x 5 days and albuterol hfa  - Monitor for fever, cough, wheezing, chest tightness or chest pain  COPD - Moderately severe obstruction on PFTs without BD response  - Moderate-severe emphysema on CT scan  - Continue Anoro 1 puff daily - Adding Albuterol rescue inhaler, take two puffs q4-6 hours prn sob/wheezing - Encourage patient to remain active - Needs repeat PFTs scheduled prior to follow-up   Pulmonary mycobacterium avium  - No active symptoms  - Most recent CT chest showed stable biopsy proven benign mass RLL - Previously followed by Dr. Cyndia Bent and Megan Salon- return as needed   Follow Up Instructions:  - FU in 1-2 months with Dr. Lamonte Sakai, or sooner if no improvement  I discussed the assessment and treatment plan with the patient. The patient was provided  an opportunity to ask questions and all were answered. The patient agreed with the plan and demonstrated an understanding of the instructions.   The patient was advised to call back or seek an in-person evaluation if  the symptoms worsen or if the condition fails to improve as anticipated.  I provided 22 minutes of non-face-to-face time during this encounter.   Martyn Ehrich, NP

## 2019-07-04 NOTE — Telephone Encounter (Signed)
Call returned to patient, she reports her husband was recently seen in the ED. She reports by the time she walked through the hospital to her Husbands room she is noticing more increased SOB. She reports it is worse than her normal SOB. She confirms that she is using anoro daily. She reports she use to could go up the stairs at home and not be SOB but now there is a significant difference. Tele-visit appt made. Nothing further needed at this time.

## 2019-07-04 NOTE — Patient Instructions (Addendum)
   Rx: Prednisone 20mg  x 5 days  Sending in Albuterol rescue inhaler- take 2 puffs every 4-6 hours for breakthrough shortness of breath/wheezing  Orders: PFTs re: sob  Recommendations: Continue Anoro 1 puff daily Monitor for increased shortness of breath, cough, wheezing, weight gain, leg swelling or fever  Follow-up: 1-2 months with [facility]  (PFTs same day or prior)

## 2019-07-08 DIAGNOSIS — I10 Essential (primary) hypertension: Secondary | ICD-10-CM | POA: Diagnosis not present

## 2019-07-08 DIAGNOSIS — M859 Disorder of bone density and structure, unspecified: Secondary | ICD-10-CM | POA: Diagnosis not present

## 2019-07-08 DIAGNOSIS — M5136 Other intervertebral disc degeneration, lumbar region: Secondary | ICD-10-CM | POA: Diagnosis not present

## 2019-07-08 DIAGNOSIS — Z1389 Encounter for screening for other disorder: Secondary | ICD-10-CM | POA: Diagnosis not present

## 2019-07-08 DIAGNOSIS — E210 Primary hyperparathyroidism: Secondary | ICD-10-CM | POA: Diagnosis not present

## 2019-07-08 DIAGNOSIS — Z0000 Encounter for general adult medical examination without abnormal findings: Secondary | ICD-10-CM | POA: Diagnosis not present

## 2019-07-08 DIAGNOSIS — Z23 Encounter for immunization: Secondary | ICD-10-CM | POA: Diagnosis not present

## 2019-07-08 DIAGNOSIS — I25810 Atherosclerosis of coronary artery bypass graft(s) without angina pectoris: Secondary | ICD-10-CM | POA: Diagnosis not present

## 2019-07-08 DIAGNOSIS — M40204 Unspecified kyphosis, thoracic region: Secondary | ICD-10-CM | POA: Diagnosis not present

## 2019-07-08 DIAGNOSIS — E7800 Pure hypercholesterolemia, unspecified: Secondary | ICD-10-CM | POA: Diagnosis not present

## 2019-07-21 ENCOUNTER — Emergency Department (HOSPITAL_COMMUNITY): Payer: PPO

## 2019-07-21 ENCOUNTER — Emergency Department (HOSPITAL_COMMUNITY)
Admission: EM | Admit: 2019-07-21 | Discharge: 2019-07-22 | Disposition: A | Discharge status: Home / Self Care | Payer: PPO | Attending: Emergency Medicine | Admitting: Emergency Medicine

## 2019-07-21 ENCOUNTER — Encounter (HOSPITAL_COMMUNITY): Payer: Self-pay | Admitting: Emergency Medicine

## 2019-07-21 DIAGNOSIS — R0789 Other chest pain: Secondary | ICD-10-CM | POA: Diagnosis not present

## 2019-07-21 DIAGNOSIS — Z87891 Personal history of nicotine dependence: Secondary | ICD-10-CM | POA: Insufficient documentation

## 2019-07-21 DIAGNOSIS — Z7982 Long term (current) use of aspirin: Secondary | ICD-10-CM | POA: Insufficient documentation

## 2019-07-21 DIAGNOSIS — R072 Precordial pain: Secondary | ICD-10-CM | POA: Diagnosis not present

## 2019-07-21 DIAGNOSIS — R0902 Hypoxemia: Secondary | ICD-10-CM | POA: Diagnosis not present

## 2019-07-21 DIAGNOSIS — I2510 Atherosclerotic heart disease of native coronary artery without angina pectoris: Secondary | ICD-10-CM | POA: Insufficient documentation

## 2019-07-21 DIAGNOSIS — Z951 Presence of aortocoronary bypass graft: Secondary | ICD-10-CM | POA: Diagnosis not present

## 2019-07-21 DIAGNOSIS — I10 Essential (primary) hypertension: Secondary | ICD-10-CM | POA: Insufficient documentation

## 2019-07-21 DIAGNOSIS — I491 Atrial premature depolarization: Secondary | ICD-10-CM | POA: Diagnosis not present

## 2019-07-21 DIAGNOSIS — Z853 Personal history of malignant neoplasm of breast: Secondary | ICD-10-CM | POA: Diagnosis not present

## 2019-07-21 DIAGNOSIS — J449 Chronic obstructive pulmonary disease, unspecified: Secondary | ICD-10-CM | POA: Diagnosis not present

## 2019-07-21 DIAGNOSIS — Z79899 Other long term (current) drug therapy: Secondary | ICD-10-CM | POA: Diagnosis not present

## 2019-07-21 DIAGNOSIS — R079 Chest pain, unspecified: Secondary | ICD-10-CM | POA: Diagnosis not present

## 2019-07-21 LAB — BASIC METABOLIC PANEL
Anion gap: 10 (ref 5–15)
BUN: 39 mg/dL — ABNORMAL HIGH (ref 8–23)
CO2: 24 mmol/L (ref 22–32)
Calcium: 10.5 mg/dL — ABNORMAL HIGH (ref 8.9–10.3)
Chloride: 104 mmol/L (ref 98–111)
Creatinine, Ser: 1.2 mg/dL — ABNORMAL HIGH (ref 0.44–1.00)
GFR calc Af Amer: 50 mL/min — ABNORMAL LOW (ref >60)
GFR calc non Af Amer: 43 mL/min — ABNORMAL LOW (ref >60)
Glucose, Bld: 99 mg/dL (ref 70–99)
Potassium: 4.3 mmol/L (ref 3.5–5.1)
Sodium: 138 mmol/L (ref 135–145)

## 2019-07-21 LAB — CBC
HCT: 41.3 % (ref 36.0–46.0)
Hemoglobin: 13.7 g/dL (ref 12.0–15.0)
MCH: 34.3 pg — ABNORMAL HIGH (ref 26.0–34.0)
MCHC: 33.2 g/dL (ref 30.0–36.0)
MCV: 103.5 fL — ABNORMAL HIGH (ref 80.0–100.0)
Platelets: 126 10*3/uL — ABNORMAL LOW (ref 150–400)
RBC: 3.99 MIL/uL (ref 3.87–5.11)
RDW: 12 % (ref 11.5–15.5)
WBC: 4.1 10*3/uL (ref 4.0–10.5)
nRBC: 0 % (ref 0.0–0.2)

## 2019-07-21 LAB — TROPONIN I (HIGH SENSITIVITY)
Troponin I (High Sensitivity): 15 ng/L (ref <18)
Troponin I (High Sensitivity): 15 ng/L (ref <18)

## 2019-07-21 MED ORDER — SODIUM CHLORIDE 0.9% FLUSH: 3.0000 mL | Freq: Once | INTRAVENOUS | Status: DC

## 2019-07-21 NOTE — ED Notes (Signed)
Per pt request husband updated outside and gave husband pts purse.

## 2019-07-21 NOTE — ED Triage Notes (Signed)
Pt arrives via [facility] for c/c cp that originally started 3 days ago pt was able to take GERD medication and it resolved. Pt states today the pain was worse so she called ems and took 324 asa. Pt was noted to be hypertensive in 200's at first bp came down to 160s with ems. Pt denies any sob, n/d.

## 2019-07-22 NOTE — ED Notes (Signed)
Patient verbalizes understanding of discharge instructions. Opportunity for questioning and answers were provided. Armband removed by staff, pt discharged from ED by wheelchair with husband to take home

## 2019-07-22 NOTE — Discharge Instructions (Signed)

## 2019-07-22 NOTE — ED Notes (Signed)
Esignature pad not working correctly. Unable to obtain signature

## 2019-07-22 NOTE — ED Provider Notes (Signed)
[name] Provider Note   CSN: [id number] Arrival date & time: 07/21/19  [place]     History   Chief Complaint Chief Complaint  Patient presents with  . Chest Pain    HPI Tiffany Caldwell is a 78 y.o. female.  HPI: A 78 year old patient with a history of hypertension and hypercholesterolemia presents for evaluation of chest pain. Initial onset of pain was more than 6 hours ago. The patient's chest pain is described as heaviness/pressure/tightness and is not worse with exertion. The patient's chest pain is middle- or left-sided, is not well-localized, is not sharp and does not radiate to the arms/jaw/neck. The patient does not complain of nausea and denies diaphoresis. The patient has a family history of coronary artery disease in a first-degree relative with onset less than age 74. The patient has no history of stroke, has no history of peripheral artery disease, has not smoked in the past 90 days, denies any history of treated diabetes and does not have an elevated BMI (>=30).   HPI Patient reports about 3 days ago she had chest pain that she thought was GERD.  That episode resolved.  It then came back tonight and she called ambulance.  She reported it was substernal in pressure but did not radiate.  She had no associated symptoms. Denies fever/vomiting.  No diaphoresis or shortness of breath.  She feels at baseline and no chest pain at this time.  This episode of chest pain was several hours ago. Past Medical History:  Diagnosis Date  . Allergy   . Arthritis   . Breast cancer ([place])    left breast  . Coronary artery disease 2001   CABG  . DDD (degenerative disc disease), lumbosacral   . Dyspnea    if rushing or climmbing lots of stairs  . Emphysema lung ([facility])    emphysema  . GERD (gastroesophageal reflux disease)    11/20/16- no a current problem  . Hyperlipidemia   . Hypertension   . Irritable bowel syndrome (IBS)   . Lung disorder    [place]  . Neuropathy   . Pinched nerve    back and left leg  . Pneumonia 2016    Patient Active Problem List   Diagnosis Date Noted  . Changing skin lesion 10/19/2018  . PAC (premature atrial contraction) 09/10/2018  . Pinched nerve   . Neuropathy   . Lung disorder   . Irritable bowel syndrome (IBS)   . Hyperlipidemia LDL goal <70   . GERD (gastroesophageal reflux disease)   . Emphysema lung ([place])   . Dyspnea   . DDD (degenerative disc disease), lumbosacral   . Breast cancer ([place])   . Arthritis   . Allergy   . Bibasilar crackles   . Fever of unknown origin   . Hypoxemia   . Somnolence   . Primary localized osteoarthritis of right hip 08/05/2016  . Primary osteoarthritis of right hip 08/05/2016  . Pulmonary Mycobacterium avium infection ([place]) 05/01/2015  . Coronary artery disease 05/01/2015  . Essential hypertension 05/01/2015  . Dyslipidemia 05/01/2015  . Solitary pulmonary nodule 12/14/2014  . COPD (chronic obstructive pulmonary disease) ([place]) 12/14/2014  . Pneumonia 10/06/2014  . History of breast cancer 07/25/2011  . Gallstones 10/31/2010  . CHRONIC RHINOSINUSITIS 09/24/2010  . EXTERNAL HEMORRHOIDS 09/23/2010  . DIVERTICULOSIS OF COLON 09/23/2010    Past Surgical History:  Procedure Laterality Date  . BREAST FIBROADENOMA SURGERY  01/13/1980  . BREAST SURGERY  02/01/2004  tram/mastectomyfor recurrence  . CARPOMETACARPEL SUSPENSION PLASTY Right 12/17/2017   Procedure: SUSPENSIONPLASTY RIGHT THUMB WITH EXCISION TRAPEZIUM;  Surgeon: Daryll Brod, MD;  Location: [facility];  Service: Orthopedics;  Laterality: Right;  . COLONOSCOPY    . CORONARY ANGIOPLASTY  2001   had tear to two vessels- had to have open heart surgery  . CORONARY ARTERY BYPASS GRAFT  2001  . EYE SURGERY Bilateral    Cataract  . KNEE ARTHROSCOPY  January 2011   right  . MASTECTOMY PARTIAL / LUMPECTOMY W/ AXILLARY LYMPHADENECTOMY  04/10/1989   Dr Annamaria Boots / left breast  . PARTIAL  HYSTERECTOMY     vaginal  . TENDON TRANSFER Right 12/17/2017   Procedure: ABDUCTOR POLLICIS LONGUS TRANSFER;  Surgeon: Daryll Brod, MD;  Location: [facility];  Service: Orthopedics;  Laterality: Right;  . TONSILLECTOMY AND ADENOIDECTOMY    . TOTAL HIP ARTHROPLASTY Right 08/05/2016   Procedure: TOTAL HIP ARTHROPLASTY ANTERIOR APPROACH;  Surgeon: [name], MD;  Location: [facility];  Service: Orthopedics;  Laterality: Right;  . UPPER GASTROINTESTINAL ENDOSCOPY    . VIDEO BRONCHOSCOPY WITH ENDOBRONCHIAL NAVIGATION N/A 11/27/2016   Procedure: VIDEO BRONCHOSCOPY WITH ENDOBRONCHIAL NAVIGATION;  Surgeon: [name], MD;  Location: [facility];  Service: Thoracic;  Laterality: N/A;     OB History   No obstetric history on file.      Home Medications    Prior to Admission medications   Medication Sig Start Date End Date Taking? Authorizing Provider  acetaminophen (TYLENOL) 500 MG tablet Take 1,000 mg by mouth 2 (two) times daily.     [provider]  albuterol (PROAIR HFA) 108 (90 Base) MCG/ACT inhaler Inhale 1-2 puffs into the lungs every 6 (six) hours as needed for wheezing or shortness of breath. 07/04/19   Martyn Ehrich, NP  aspirin EC 81 MG tablet Take 81 mg by mouth daily.    [provider]  atorvastatin (LIPITOR) 20 MG tablet TAKE 1 TABLET BY MOUTH EVERY DAY 05/16/19   End, Harrell Gave, MD  Biotin 10000 MCG TABS Take 1 tablet by mouth daily.    [provider]  Calcium Carbonate-Vitamin D (CALCIUM 600 + D PO) Take 1 tablet by mouth at bedtime.     [provider]  Carboxymethylcell-Hypromellose (GENTEAL OP) Place 1 drop into both eyes at bedtime.    [provider]  fexofenadine (ALLEGRA) 180 MG tablet Take 180 mg by mouth daily.    [provider]  fish oil-omega-3 fatty acids 1000 MG capsule Take 1 g by mouth 2 (two) times daily.     [provider]  gabapentin (NEURONTIN) 300 MG capsule Take 300 mg by  mouth 4 (four) times daily.     [provider]  Glucosamine-Chondroit-Vit C-Mn (GLUCOSAMINE CHONDR 1500 COMPLX PO) Take 1 tablet by mouth 2 (two) times daily.    [provider]  hyoscyamine (LEVBID) 0.375 MG 12 hr tablet Take 0.375 mg by mouth every 12 (twelve) hours as needed for cramping (IBS).     [provider]  Multiple Vitamin (MULTIVITAMIN) tablet Take 1 tablet by mouth daily.      [provider]  predniSONE (DELTASONE) 10 MG tablet Take 2 tabs x 5 days 07/04/19   Martyn Ehrich, NP  umeclidinium-vilanterol (ANORO ELLIPTA) 62.5-25 MCG/INH AEPB Inhale 1 puff into the lungs daily. 04/20/19   Martyn Ehrich, NP  valsartan-hydrochlorothiazide (DIOVAN-HCT) 80-12.5 MG per tablet Take 1 tablet by mouth daily.  [provider]    Family History Family History  Problem Relation Age of Onset  . Emphysema Father        deceased  . Heart disease Mother        deceased  . Colon cancer Paternal Grandmother   . Colon cancer Son   . Colon cancer Paternal Uncle     Social History Social History   Tobacco Use  . Smoking status: Former Smoker    Years: 30.00    Types: Cigarettes    Quit date: 07/31/1989    Years since quitting: 29.9  . Smokeless tobacco: Never Used  Substance Use Topics  . Alcohol use: Yes    Alcohol/week: 1.0 standard drinks    Types: 1 Glasses of wine per week    Comment: occasional wine  . Drug use: No     Allergies   Hydromorphone, Azithromycin, Ciprofloxacin, Hydromorphone hcl, Levofloxacin, and Lisinopril   Review of Systems Review of Systems  Constitutional: Negative for diaphoresis and fever.  Respiratory: Negative for shortness of breath.   Cardiovascular: Positive for chest pain. Negative for leg swelling.  Gastrointestinal: Negative for vomiting.  All other systems reviewed and are negative.    Physical Exam Updated Vital Signs BP (!) 143/75   Pulse 69   Temp 98 F (36.7 C) (Oral)    Resp 15   LMP  (LMP Unknown)   SpO2 97%   Physical Exam CONSTITUTIONAL: Well developed/well nourished HEAD: Normocephalic/atraumatic EYES: EOMI/PERRL ENMT: Mucous membranes moist NECK: supple no meningeal signs SPINE/BACK:entire spine nontender, kyphotic spine CV: S1/S2 noted, no murmurs/rubs/gallops noted LUNGS: Lungs are clear to auscultation bilaterally, no apparent distress ABDOMEN: soft, nontender, no rebound or guarding, bowel sounds noted throughout abdomen GU:no cva tenderness NEURO: Pt is awake/alert/appropriate, moves all extremitiesx4.  No facial droop.   EXTREMITIES: pulses normal/equal, full ROM, no lower extremity tenderness or tenderness SKIN: warm, color normal PSYCH: no abnormalities of mood noted, alert and oriented to situation   ED Treatments / Results  Labs (all labs ordered are listed, but only abnormal results are displayed) Labs Reviewed  BASIC METABOLIC PANEL - Abnormal; Notable for the following components:      Result Value   BUN 39 (*)    Creatinine, Ser 1.20 (*)    Calcium 10.5 (*)    GFR calc non Af Amer 43 (*)    GFR calc Af Amer 50 (*)    All other components within normal limits  CBC - Abnormal; Notable for the following components:   MCV 103.5 (*)    MCH 34.3 (*)    Platelets 126 (*)    All other components within normal limits  TROPONIN I (HIGH SENSITIVITY)  TROPONIN I (HIGH SENSITIVITY)    EKG EKG Interpretation  Date/Time:  Thursday July 21 2019 18:46:45 EDT Ventricular Rate:  68 PR Interval:  188 QRS Duration: 86 QT Interval:  392 QTC Calculation: 416 R Axis:   -3 Text Interpretation:  Normal sinus rhythm with sinus arrhythmia Moderate voltage criteria for LVH, may be normal variant ( R in aVL , Cornell product ) Septal infarct , age undetermined ST & T wave abnormality, consider lateral ischemia Abnormal ECG No significant change since last tracing october 2017 Confirmed by Merrily Pew [phone]) on 07/21/2019 6:52:44 PM    Radiology Dg Chest 2 View  Result Date: 07/21/2019 CLINICAL DATA:  [age]-year-old female with chest pain. EXAM: CHEST - 2 VIEW COMPARISON:  Chest CT dated 06/22/2019 FINDINGS: There is  background of emphysema. No focal consolidation, pleural effusion, or pneumothorax. Right lower lobe mass seen on the prior CT is not visualized on this radiograph. The cardiac silhouette is within normal limits. Median sternotomy wires, CABG vascular clips, and coronary artery stent noted. No acute osseous pathology. Osteopenia. IMPRESSION: 1. No acute cardiopulmonary process. 2. Emphysema. Electronically Signed   By: Anner Crete M.D.   On: 07/21/2019 19:45    Procedures Procedures  Medications Ordered in ED Medications  sodium chloride flush (NS) 0.9 % injection 3 mL (has no administration in time range)     Initial Impression / Assessment and Plan / ED Course  I have reviewed the triage vital signs and the nursing notes.  Pertinent labs & imaging results that were available during my care of the patient were reviewed by me and considered in my medical decision making (see chart for details).     HEAR Score: 6  Patient with distant history of CAD presents with chest pain.  She had a stent placed in the early 2000's then had an iatrogenic complication where she required CABG  She has done well since that time.  She reported episode of chest pain about 3 days ago that resolved.  Tonight's episode returned and she called 911.  She had no other associated symptoms and she is now chest pain-free.  She is very well-appearing.  There are no acute EKG changes on prehospital EKG or tonight's EKG when compared to prior  Patient has a hear score of 6 but troponins are reassuring.  I gave the patient the option of admission versus close follow-up as an outpatient.  She is already established with a local cardiologist and would like to follow-up with them.   Patient is very reasonable and I feel she will  return if there is any acute changes.  We discussed strict return precautions.  Final Clinical Impressions(s) / ED Diagnoses   Final diagnoses:  Precordial pain    ED Discharge Orders    None       Ripley Fraise, MD 07/22/19 [phone]

## 2019-07-25 DIAGNOSIS — R739 Hyperglycemia, unspecified: Secondary | ICD-10-CM | POA: Diagnosis not present

## 2019-07-25 DIAGNOSIS — N289 Disorder of kidney and ureter, unspecified: Secondary | ICD-10-CM | POA: Diagnosis not present

## 2019-07-26 ENCOUNTER — Encounter: Payer: Self-pay | Admitting: Physician Assistant

## 2019-07-26 ENCOUNTER — Other Ambulatory Visit: Payer: Self-pay

## 2019-07-26 ENCOUNTER — Ambulatory Visit (INDEPENDENT_AMBULATORY_CARE_PROVIDER_SITE_OTHER): Payer: PPO | Admitting: Physician Assistant

## 2019-07-26 VITALS — BP 118/60 | HR 77 | Temp 96.4°F | Ht 63.0 in | Wt 144.0 lb

## 2019-07-26 DIAGNOSIS — E785 Hyperlipidemia, unspecified: Secondary | ICD-10-CM | POA: Diagnosis not present

## 2019-07-26 DIAGNOSIS — I25119 Atherosclerotic heart disease of native coronary artery with unspecified angina pectoris: Secondary | ICD-10-CM | POA: Diagnosis not present

## 2019-07-26 DIAGNOSIS — I10 Essential (primary) hypertension: Secondary | ICD-10-CM | POA: Diagnosis not present

## 2019-07-26 DIAGNOSIS — I491 Atrial premature depolarization: Secondary | ICD-10-CM

## 2019-07-26 MED ORDER — ISOSORBIDE MONONITRATE ER 30 MG PO TB24: 30.0000 mg | ORAL_TABLET | Freq: Every day | ORAL | 3 refills | Status: DC

## 2019-07-26 NOTE — Progress Notes (Signed)
Cardiology Office Note    Date:  07/26/2019   ID:  Sharane, Deibel 03-Oct-1941, MRN [id number]  PCP:  Lavone Orn, MD  Cardiologist:  [name], MD  Electrophysiologist:  None   Chief Complaint: ED follow-up  History of Present Illness:   Tiffany Caldwell is a 78 y.o. female with history of CAD status post remote PCI with subsequent emergent CABG for iatrogenic left main/LAD dissection in 2001, right lower lobe cavitary lung mass, HTN, HLD, left-sided breast cancer, DDD, COPD, IBS, and GERD who presents for ED follow-up.  Prior left heart cath in 12/1999 showed left main normal, LAD with 80 and 90% mid vessel stenosis, LCx without significant disease, RCA with 30% proximal stenosis.  She underwent successful PCI/BMS to the mid LAD.  Repeat cath on 05/28/2000 showed left main normal, LAD with 80 to 90% mid vessel in-stent restenosis, LCx normal, RCA with 30 to 40% proximal stenosis as well as ostial spasm.  Plan was for angioplasty of the previously placed stent though procedure was complicated by proximal LAD dissection with placement of BMS.  Further propagation of the dissection to the left main occurred prompting referral for urgent CABG.  Patient underwent three-vessel CABG with LIMA to LAD, SVG to diagonal, and SVG to OM.  2D echo from 11/2014 showed an EF of 55 to 60% without regional wall motion abnormalities, grade 1 diastolic dysfunction, mild concentric LVH, severely dilated left atrium, normal RV size and systolic function, mild tricuspid regurgitation, otherwise no significant valvular abnormalities.  She has chronic exertional dyspnea as well as occasional palpitations.  She was last seen in the office in 09/2018 and was feeling relatively well.  She continued to note exertional dyspnea when climbing 20-25 steps which has been stable for years.  With transition from simvastatin to atorvastatin in 2018 her LDL improved as detailed below.  She was seen in the [facility],  ED on 07/21/2019 with chest pain described as heaviness/pressure/tightness that was nonexertional and without radiation.  Pain initially began 3 days prior to her presentation that she thought was GERD with spontaneous resolution.  However upon recurrence of pain she presented to the ED as above.  High-sensitivity troponin 15 with a delta of 15.  BMP showed mild AKI with a serum creatinine of 1.2 and BUN of 39.  Potassium 4.3.  Hemoglobin 13.7.  Chest x-ray showed emphysema with no acute cardiopulmonary process.  EKG showed sinus arrhythmia with poor R wave progression along the precordial leads, prior anterior infarct, and inferolateral T wave inversion which appeared to be slightly more pronounced when compared to prior study.  Patient declined admission and preferred to follow-up as outpatient.  She comes in doing well today.  Since her ED visit she has not had any further chest pressure.  She indicates her initial episode that occurred 3 days prior to ED visit lasted for approximately 15 minutes, occurred at rest, and spontaneously resolved.  Her episode that occurred leading up to her ED evaluation lasted for approximately 20 minutes followed by resolution.  Since then, she has been symptom-free.  Pain did not feel similar to her prior symptoms leading up to her cardiac cath as outlined above.  There was no associated shortness of breath, palpitations, diaphoresis, nausea, vomiting, dizziness, presyncope, or syncope.  She continues to live an active lifestyle without symptoms concerning for angina.  Currently symptom-free.   Labs: 07/2019 - Hgb 13.7, PLT 126, potassium 4.3, serum creatinine 1.2, BUN 39 12/2017 -  total cholesterol 155, triglyceride 79, HDL 72, LDL 67, ALT normal  Past Medical History:  Diagnosis Date  . Allergy   . Arthritis   . Breast cancer ([place])    left breast  . Coronary artery disease 2001   CABG  . DDD (degenerative disc disease), lumbosacral   . Dyspnea    if rushing or  climmbing lots of stairs  . Emphysema lung ([facility])    emphysema  . GERD (gastroesophageal reflux disease)    11/20/16- no a current problem  . Hyperlipidemia   . Hypertension   . Irritable bowel syndrome (IBS)   . Lung disorder    leison  . Neuropathy   . Pinched nerve    back and left leg  . Pneumonia 2016    Past Surgical History:  Procedure Laterality Date  . BREAST FIBROADENOMA SURGERY  01/13/1980  . BREAST SURGERY  02/01/2004   tram/mastectomyfor recurrence  . CARPOMETACARPEL SUSPENSION PLASTY Right 12/17/2017   Procedure: SUSPENSIONPLASTY RIGHT THUMB WITH EXCISION TRAPEZIUM;  Surgeon: Daryll Brod, MD;  Location: [facility];  Service: Orthopedics;  Laterality: Right;  . COLONOSCOPY    . CORONARY ANGIOPLASTY  2001   had tear to two vessels- had to have open heart surgery  . CORONARY ARTERY BYPASS GRAFT  2001  . EYE SURGERY Bilateral    Cataract  . KNEE ARTHROSCOPY  January 2011   right  . MASTECTOMY PARTIAL / LUMPECTOMY W/ AXILLARY LYMPHADENECTOMY  04/10/1989   Dr Annamaria Boots / left breast  . PARTIAL HYSTERECTOMY     vaginal  . TENDON TRANSFER Right 12/17/2017   Procedure: ABDUCTOR POLLICIS LONGUS TRANSFER;  Surgeon: Daryll Brod, MD;  Location: [place];  Service: Orthopedics;  Laterality: Right;  . TONSILLECTOMY AND ADENOIDECTOMY    . TOTAL HIP ARTHROPLASTY Right 08/05/2016   Procedure: TOTAL HIP ARTHROPLASTY ANTERIOR APPROACH;  Surgeon: [name], MD;  Location: [facility];  Service: Orthopedics;  Laterality: Right;  . UPPER GASTROINTESTINAL ENDOSCOPY    . VIDEO BRONCHOSCOPY WITH ENDOBRONCHIAL NAVIGATION N/A 11/27/2016   Procedure: VIDEO BRONCHOSCOPY WITH ENDOBRONCHIAL NAVIGATION;  Surgeon: [name], MD;  [facility];  Service: Thoracic;  Laterality: N/A;    Current Medications: Current Meds  Medication Sig  . acetaminophen (TYLENOL) 500 MG tablet Take 1,000 mg by mouth 2 (two) times daily.   Marland Kitchen albuterol (PROAIR HFA) 108 (90  Base) MCG/ACT inhaler Inhale 1-2 puffs into the lungs every 6 (six) hours as needed for wheezing or shortness of breath.  Marland Kitchen aspirin EC 81 MG tablet Take 81 mg by mouth daily.  Marland Kitchen atorvastatin (LIPITOR) 20 MG tablet TAKE 1 TABLET BY MOUTH EVERY DAY  . Biotin 10000 MCG TABS Take 1 tablet by mouth daily.  . Carboxymethylcell-Hypromellose (GENTEAL OP) Place 1 drop into both eyes at bedtime.  . fexofenadine (ALLEGRA) 180 MG tablet Take 180 mg by mouth daily.  . fish oil-omega-3 fatty acids 1000 MG capsule Take 1 g by mouth 2 (two) times daily.   Marland Kitchen gabapentin (NEURONTIN) 300 MG capsule Take 300 mg by mouth 4 (four) times daily.   . Glucosamine-Chondroit-Vit C-Mn (GLUCOSAMINE CHONDR 1500 COMPLX PO) Take 1 tablet by mouth 2 (two) times daily.  . hyoscyamine (LEVBID) 0.375 MG 12 hr tablet Take 0.375 mg by mouth every 12 (twelve) hours as needed for cramping (IBS).   . Multiple Vitamin (MULTIVITAMIN) tablet Take 1 tablet by mouth daily.    Marland Kitchen umeclidinium-vilanterol (ANORO ELLIPTA) 62.5-25 MCG/INH AEPB Inhale 1  puff into the lungs daily.  . valsartan-hydrochlorothiazide (DIOVAN-HCT) 80-12.5 MG per tablet Take 1 tablet by mouth daily.      Allergies:   Hydromorphone, Azithromycin, Ciprofloxacin, Hydromorphone hcl, Levofloxacin, and Lisinopril   Social History   Socioeconomic History  . Marital status: Married    Spouse name: Not on file  . Number of children: Not on file  . Years of education: Not on file  . Highest education level: Not on file  Occupational History  . Not on file  Social Needs  . Financial resource strain: Not on file  . Food insecurity    Worry: Not on file    Inability: Not on file  . Transportation needs    Medical: Not on file    Non-medical: Not on file  Tobacco Use  . Smoking status: Former Smoker    Years: 30.00    Types: Cigarettes    Quit date: 07/31/1989    Years since quitting: 30.0  . Smokeless tobacco: Never Used  Substance and Sexual Activity  . Alcohol  use: Yes    Alcohol/week: 1.0 standard drinks    Types: 1 Glasses of wine per week    Comment: occasional wine  . Drug use: No  . Sexual activity: Not on file  Lifestyle  . Physical activity    Days per week: Not on file    Minutes per session: Not on file  . Stress: Not on file  Relationships  . Social Herbalist on phone: Not on file    Gets together: Not on file    Attends religious service: Not on file    Active member of club or organization: Not on file    Attends meetings of clubs or organizations: Not on file    Relationship status: Not on file  Other Topics Concern  . Not on file  Social History Narrative  . Not on file     Family History:  The patient's family history includes Colon cancer in her paternal grandmother, paternal uncle, and son; Emphysema in her father; Heart disease in her mother.  ROS:   Review of Systems  Constitutional: Negative for chills, diaphoresis, fever, malaise/fatigue and weight loss.  HENT: Negative for congestion.   Eyes: Negative for discharge and redness.  Respiratory: Negative for cough, hemoptysis, sputum production, shortness of breath and wheezing.   Cardiovascular: Positive for chest pain. Negative for palpitations, orthopnea, claudication, leg swelling and PND.       Resolved chest pressure  Gastrointestinal: Negative for abdominal pain, blood in stool, heartburn, melena, nausea and vomiting.  Genitourinary: Negative for hematuria.  Musculoskeletal: Negative for falls and myalgias.  Skin: Negative for rash.  Neurological: Negative for dizziness, tingling, tremors, sensory change, speech change, focal weakness, loss of consciousness and weakness.  Endo/Heme/Allergies: Does not bruise/bleed easily.  Psychiatric/Behavioral: Negative for substance abuse. The patient is not nervous/anxious.   All other systems reviewed and are negative.    EKGs/Labs/Other Studies Reviewed:    Studies reviewed were summarized above.  The additional studies were reviewed today: as above  EKG:  EKG is ordered today.  The EKG ordered today demonstrates NSR, 77 bpm, occasional PACs, LVH with early repolarization abnormality, nonspecific lateral ST-T changes possibly slightly more pronounced when compared to prior  Recent Labs: 07/21/2019: BUN 39; Creatinine, Ser 1.20; Hemoglobin 13.7; Platelets 126; Potassium 4.3; Sodium 138  Recent Lipid Panel    Component Value Date/Time   CHOL 155 12/08/2017 1045   TRIG  79 12/08/2017 1045   HDL 72 12/08/2017 1045   CHOLHDL 2.2 12/08/2017 1045   LDLCALC 67 12/08/2017 1045    PHYSICAL EXAM:    VS:  BP 118/60 (BP Location: Left Arm, Patient Position: Sitting, Cuff Size: Normal)   Pulse 77   Temp (!) 96.4 F (35.8 C)   Ht 5\' 3"  (1.6 m)   Wt 144 lb (65.3 kg)   LMP  (LMP Unknown)   BMI 25.51 kg/m   BMI: Body mass index is 25.51 kg/m.  Physical Exam  Constitutional: She is oriented to person, place, and time. She appears well-developed and well-nourished.  HENT:  Head: Normocephalic and atraumatic.  Eyes: Right eye exhibits no discharge. Left eye exhibits no discharge.  Neck: Normal range of motion. No JVD present.  Cardiovascular: Normal rate, regular rhythm, S1 normal, S2 normal and normal heart sounds. Exam reveals no distant heart sounds, no friction rub, no midsystolic click and no opening snap.  No murmur heard. Pulses:      Posterior tibial pulses are 2+ on the right side and 2+ on the left side.  Pulmonary/Chest: Effort normal and breath sounds normal. No respiratory distress. She has no decreased breath sounds. She has no wheezes. She has no rales. She exhibits no tenderness.  Abdominal: Soft. She exhibits no distension. There is no abdominal tenderness.  Musculoskeletal:        General: No edema.  Neurological: She is alert and oriented to person, place, and time.  Skin: Skin is warm and dry. No cyanosis. Nails show no clubbing.  Psychiatric: She has a normal mood  and affect. Her speech is normal and behavior is normal. Judgment and thought content normal.    Wt Readings from Last 3 Encounters:  07/26/19 144 lb (65.3 kg)  06/22/19 140 lb (63.5 kg)  04/19/19 141 lb 9.6 oz (64.2 kg)     ASSESSMENT & PLAN:   1. CAD involving the native coronary arteries status post CABG with atypical angina: Patient seen in the ED earlier this month with chest pressure lasting 15 to 20 minutes with negative high-sensitivity troponin and delta troponin.  She has been chest pain-free since.  Recommended Lexiscan Myoview to evaluate for high risk ischemia which was declined by the patient.  Symptoms do not warrant direct progression to cardiac cath at this time, which patient agrees.  Patient prefers to medically manage initially and in this setting we will send in Imdur 30 mg daily.  She will continue aspirin and atorvastatin.  She will let us know if symptoms return and we will proceed with ischemic evaluation at that time at her request.  2. PACs: Longstanding and asymptomatic.  No further work-up at this time.  3. HTN: Blood pressure is well controlled.  Continue current regimen.    4. HLD: LDL of 67 from 12/2017.  Continue atorvastatin.  Disposition: F/u with Dr. Saunders Revel or an APP in 3 months, sooner if needed.   Medication Adjustments/Labs and Tests Ordered: Current medicines are reviewed at length with the patient today.  Concerns regarding medicines are outlined above. Medication changes, Labs and Tests ordered today are summarized above and listed in the Patient Instructions accessible in Encounters.   Signed, [name], PA-C 07/26/2019 2:59 PM     [address] [place], [place] [address] [phone]

## 2019-07-26 NOTE — Patient Instructions (Signed)
Medication Instructions:  1- START Imdur Take 1 tablet (30 mg total) by mouth daily. *If you need a refill on your cardiac medications before your next appointment, please call your pharmacy*  Lab Work: None ordered  If you have labs (blood work) drawn today and your tests are completely normal, you will receive your results only by: Marland Kitchen MyChart Message (if you have MyChart) OR . A paper copy in the mail If you have any lab test that is abnormal or we need to change your treatment, we will call you to review the results.  Testing/Procedures: None ordered   Follow-Up: At [facility], you and your health needs are our priority.  As part of our continuing mission to provide you with exceptional heart care, we have created designated Provider Care Teams.  These Care Teams include your primary Cardiologist (physician) and Advanced Practice Providers (APPs -  Physician Assistants and Nurse Practitioners) who all work together to provide you with the care you need, when you need it.  Your next appointment:   3 months  The format for your next appointment:   In Person  Provider:    You may see [name], MD or [name], PA-C.

## 2019-08-01 ENCOUNTER — Encounter: Payer: Self-pay | Admitting: Physician Assistant

## 2019-08-03 ENCOUNTER — Other Ambulatory Visit: Payer: Self-pay

## 2019-08-03 DIAGNOSIS — Z853 Personal history of malignant neoplasm of breast: Secondary | ICD-10-CM | POA: Diagnosis not present

## 2019-08-03 DIAGNOSIS — I10 Essential (primary) hypertension: Secondary | ICD-10-CM | POA: Insufficient documentation

## 2019-08-03 DIAGNOSIS — J449 Chronic obstructive pulmonary disease, unspecified: Secondary | ICD-10-CM | POA: Insufficient documentation

## 2019-08-03 DIAGNOSIS — Z87891 Personal history of nicotine dependence: Secondary | ICD-10-CM | POA: Insufficient documentation

## 2019-08-03 DIAGNOSIS — R29890 Loss of height: Secondary | ICD-10-CM | POA: Diagnosis not present

## 2019-08-03 DIAGNOSIS — M85852 Other specified disorders of bone density and structure, left thigh: Secondary | ICD-10-CM | POA: Diagnosis not present

## 2019-08-03 DIAGNOSIS — Z90710 Acquired absence of both cervix and uterus: Secondary | ICD-10-CM | POA: Diagnosis not present

## 2019-08-03 DIAGNOSIS — J439 Emphysema, unspecified: Secondary | ICD-10-CM | POA: Diagnosis not present

## 2019-08-03 DIAGNOSIS — I2510 Atherosclerotic heart disease of native coronary artery without angina pectoris: Secondary | ICD-10-CM | POA: Diagnosis not present

## 2019-08-03 DIAGNOSIS — Z96651 Presence of right artificial knee joint: Secondary | ICD-10-CM | POA: Diagnosis not present

## 2019-08-03 DIAGNOSIS — Z7982 Long term (current) use of aspirin: Secondary | ICD-10-CM | POA: Insufficient documentation

## 2019-08-03 DIAGNOSIS — R079 Chest pain, unspecified: Secondary | ICD-10-CM | POA: Insufficient documentation

## 2019-08-03 DIAGNOSIS — Z79899 Other long term (current) drug therapy: Secondary | ICD-10-CM | POA: Diagnosis not present

## 2019-08-03 DIAGNOSIS — Z96641 Presence of right artificial hip joint: Secondary | ICD-10-CM | POA: Insufficient documentation

## 2019-08-03 DIAGNOSIS — Z951 Presence of aortocoronary bypass graft: Secondary | ICD-10-CM | POA: Insufficient documentation

## 2019-08-03 NOTE — ED Triage Notes (Signed)
Patient ambulatory to triage with steady gait, without difficulty or distress noted; pt reports "funny sensation in chest" tonight; denies pain, denies accomp symptoms

## 2019-08-04 ENCOUNTER — Other Ambulatory Visit: Payer: Self-pay

## 2019-08-04 ENCOUNTER — Encounter: Payer: Self-pay | Admitting: Emergency Medicine

## 2019-08-04 ENCOUNTER — Emergency Department: Payer: PPO

## 2019-08-04 ENCOUNTER — Telehealth: Payer: Self-pay | Admitting: Emergency Medicine

## 2019-08-04 ENCOUNTER — Telehealth: Payer: Self-pay | Admitting: Physician Assistant

## 2019-08-04 ENCOUNTER — Emergency Department
Admission: EM | Admit: 2019-08-04 | Discharge: 2019-08-04 | Disposition: A | Discharge status: Home / Self Care | Payer: PPO | Attending: Emergency Medicine | Admitting: Emergency Medicine

## 2019-08-04 DIAGNOSIS — R079 Chest pain, unspecified: Secondary | ICD-10-CM

## 2019-08-04 DIAGNOSIS — R0602 Shortness of breath: Secondary | ICD-10-CM

## 2019-08-04 DIAGNOSIS — J449 Chronic obstructive pulmonary disease, unspecified: Secondary | ICD-10-CM | POA: Diagnosis not present

## 2019-08-04 LAB — COMPREHENSIVE METABOLIC PANEL
ALT: 25 U/L (ref 0–44)
AST: 34 U/L (ref 15–41)
Albumin: 3.9 g/dL (ref 3.5–5.0)
Alkaline Phosphatase: 88 U/L (ref 38–126)
Anion gap: 9 (ref 5–15)
BUN: 33 mg/dL — ABNORMAL HIGH (ref 8–23)
CO2: 30 mmol/L (ref 22–32)
Calcium: 10.5 mg/dL — ABNORMAL HIGH (ref 8.9–10.3)
Chloride: 103 mmol/L (ref 98–111)
Creatinine, Ser: 1.06 mg/dL — ABNORMAL HIGH (ref 0.44–1.00)
GFR calc Af Amer: 58 mL/min — ABNORMAL LOW (ref >60)
GFR calc non Af Amer: 50 mL/min — ABNORMAL LOW (ref >60)
Glucose, Bld: 103 mg/dL — ABNORMAL HIGH (ref 70–99)
Potassium: 3.8 mmol/L (ref 3.5–5.1)
Sodium: 142 mmol/L (ref 135–145)
Total Bilirubin: 0.5 mg/dL (ref 0.3–1.2)
Total Protein: 6.8 g/dL (ref 6.5–8.1)

## 2019-08-04 LAB — CBC WITH DIFFERENTIAL/PLATELET
Abs Immature Granulocytes: 0.01 10*3/uL (ref 0.00–0.07)
Basophils Absolute: 0 10*3/uL (ref 0.0–0.1)
Basophils Relative: 1 %
Eosinophils Absolute: 0.1 10*3/uL (ref 0.0–0.5)
Eosinophils Relative: 2 %
HCT: 37.8 % (ref 36.0–46.0)
Hemoglobin: 12.2 g/dL (ref 12.0–15.0)
Immature Granulocytes: 0 %
Lymphocytes Relative: 33 %
Lymphs Abs: 1.3 10*3/uL (ref 0.7–4.0)
MCH: 33.2 pg (ref 26.0–34.0)
MCHC: 32.3 g/dL (ref 30.0–36.0)
MCV: 103 fL — ABNORMAL HIGH (ref 80.0–100.0)
Monocytes Absolute: 0.5 10*3/uL (ref 0.1–1.0)
Monocytes Relative: 13 %
Neutro Abs: 2 10*3/uL (ref 1.7–7.7)
Neutrophils Relative %: 51 %
Platelets: 137 10*3/uL — ABNORMAL LOW (ref 150–400)
RBC: 3.67 MIL/uL — ABNORMAL LOW (ref 3.87–5.11)
RDW: 12 % (ref 11.5–15.5)
WBC: 4 10*3/uL (ref 4.0–10.5)
nRBC: 0 % (ref 0.0–0.2)

## 2019-08-04 LAB — TROPONIN I (HIGH SENSITIVITY)
Troponin I (High Sensitivity): 12 ng/L (ref <18)
Troponin I (High Sensitivity): 12 ng/L (ref <18)

## 2019-08-04 MED ORDER — ASPIRIN 81 MG PO CHEW
324.0000 mg | CHEWABLE_TABLET | Freq: Once | ORAL | Status: AC
  Administered 2019-08-04: 324 mg via ORAL
  Filled 2019-08-04: qty 4

## 2019-08-04 NOTE — Telephone Encounter (Signed)
[place]  Your caregiver has ordered a Stress Test with nuclear imaging. The purpose of this test is to evaluate the blood supply to your heart muscle. This procedure is referred to as a "Non-Invasive Stress Test." This is because other than having an IV started in your vein, nothing is inserted or "invades" your body. Cardiac stress tests are done to find areas of poor blood flow to the heart by determining the extent of coronary artery disease (CAD). Some patients exercise on a treadmill, which naturally increases the blood flow to your heart, while others who are  unable to walk on a treadmill due to physical limitations have a pharmacologic/chemical stress agent called Lexiscan . This medicine will mimic walking on a treadmill by temporarily increasing your coronary blood flow.   Please note: these test may take anywhere between 2-4 hours to complete  PLEASE REPORT TO [facility] AT THE FIRST DESK WILL DIRECT YOU WHERE TO GO  Date of Procedure:_____________________________________  Arrival Time for Procedure:______________________________  Returned call to patient to make her aware that [name], PA would like to proceed with myoview. Appt made and instructions reviewed.   Instructions regarding medication:   No medications to hold. 10/30 @ 7:30 AM arrival.   PLEASE NOTIFY THE OFFICE AT LEAST 24 HOURS IN ADVANCE IF YOU ARE UNABLE TO KEEP YOUR APPOINTMENT.  [phone] AND  PLEASE NOTIFY NUCLEAR MEDICINE AT [facility] AT LEAST 24 HOURS IN ADVANCE IF YOU ARE UNABLE TO KEEP YOUR APPOINTMENT. [phone]  How to prepare for your Myoview test:  1. Do not eat or drink after midnight 2. No caffeine for 24 hours prior to test 3. No smoking 24 hours prior to test. 4. Your medication may be taken with water.  If your doctor stopped a medication because of this test, do not take that medication. 5. Ladies, please do not wear dresses.  Skirts or pants are  appropriate. Please wear a short sleeve shirt. 6. No perfume, cologne or lotion. 7. Wear comfortable walking shoes. No heels!    Pt verbalized understanding and cooperative with POC. No further orders at this time.

## 2019-08-04 NOTE — Telephone Encounter (Signed)
Call returned to patient, she is wanting to r/s her PFT. She states she wanted to let Dr. Lamonte Sakai know that she will call back when she is ready to do the PFT. Patient reports she was seen in the ED today again for chest pain. I made her aware I would get this information sent to Dr. Lamonte Sakai as a FYI.   [facility] can we cancel her covid appt please. I have cancelled her PFT at her request.   Will route message to [facility] to cancel covid test. Once cancelled we will route to [facility] as FYI.

## 2019-08-04 NOTE — Telephone Encounter (Signed)
Patient was in the ED last night and wanted to let [name] PA know, please call to discuss

## 2019-08-04 NOTE — Telephone Encounter (Signed)
Please schedule patient for [facility].  Follow-up 1 week after stress test.

## 2019-08-04 NOTE — ED Provider Notes (Signed)
[facility] Emergency Department Provider Note  ____________________________________________  Time seen: Approximately 3:30 AM  I have reviewed the triage vital signs and the nursing notes.   HISTORY  Chief Complaint Chest Pain   HPI Tiffany Caldwell is a 78 y.o. female with history of remote breast cancer, CAD status post stenting and CABGs, COPD, hypertension, hyperlipidemia who presents for evaluation of chest pain.  Patient reports that she was sitting on her recliner watching TV at 10 PM when she developed a "funny feeling in her chest."  She denies having chest pain or pressure.  She reports that it was a funny sensation that she does not know how to describe.  She took 3 sublingual nitros every 5 minutes apart but that did not change the sensation.  She denies having shortness of breath, dizziness, diaphoresis, nausea or vomiting associated with this episode.  The whole episode lasted about an hour and he has now resolved.  Patient reports that she was seen  in [facility] 2 weeks ago for an episode of chest pressure which was different than this one.  She has not had a stress test or catheterization in several years.  She denies any cough or fever or known exposures to Covid.  Currently has no chest pain.  She denies any personal or family history of blood clots, recent travel immobilization, leg pain or swelling, hemoptysis, exogenous hormones.  Past Medical History:  Diagnosis Date  . Allergy   . Arthritis   . Breast cancer ([place])    left breast  . Coronary artery disease 2001   CABG  . DDD (degenerative disc disease), lumbosacral   . Dyspnea    if rushing or climmbing lots of stairs  . Emphysema lung (HCC)    emphysema  . GERD (gastroesophageal reflux disease)    11/20/16- no a current problem  . Hyperlipidemia   . Hypertension   . Irritable bowel syndrome (IBS)   . Lung disorder    [place]  . Neuropathy   . Pinched nerve    back and left leg   . Pneumonia 2016    Patient Active Problem List   Diagnosis Date Noted  . Changing skin lesion 10/19/2018  . PAC (premature atrial contraction) 09/10/2018  . Pinched nerve   . Neuropathy   . Lung disorder   . Irritable bowel syndrome (IBS)   . Hyperlipidemia LDL goal <70   . GERD (gastroesophageal reflux disease)   . Emphysema lung ([place])   . Dyspnea   . DDD (degenerative disc disease), lumbosacral   . Breast cancer ([place])   . Arthritis   . Allergy   . Bibasilar crackles   . Fever of unknown origin   . Hypoxemia   . Somnolence   . Primary localized osteoarthritis of right hip 08/05/2016  . Primary osteoarthritis of right hip 08/05/2016  . Pulmonary Mycobacterium avium infection ([place]) 05/01/2015  . Coronary artery disease 05/01/2015  . Essential hypertension 05/01/2015  . Dyslipidemia 05/01/2015  . Solitary pulmonary nodule 12/14/2014  . COPD (chronic obstructive pulmonary disease) ([place]) 12/14/2014  . Pneumonia 10/06/2014  . History of breast cancer 07/25/2011  . Gallstones 10/31/2010  . CHRONIC RHINOSINUSITIS 09/24/2010  . EXTERNAL HEMORRHOIDS 09/23/2010  . DIVERTICULOSIS OF COLON 09/23/2010    Past Surgical History:  Procedure Laterality Date  . BREAST FIBROADENOMA SURGERY  01/13/1980  . BREAST SURGERY  02/01/2004   tram/mastectomyfor recurrence  . CARPOMETACARPEL SUSPENSION PLASTY Right 12/17/2017   Procedure: SUSPENSIONPLASTY  RIGHT THUMB WITH EXCISION TRAPEZIUM;  Surgeon: Daryll Brod, MD;  Location: [place];  Service: Orthopedics;  Laterality: Right;  . COLONOSCOPY    . CORONARY ANGIOPLASTY  2001   had tear to two vessels- had to have open heart surgery  . CORONARY ARTERY BYPASS GRAFT  2001  . EYE SURGERY Bilateral    Cataract  . KNEE ARTHROSCOPY  January 2011   right  . MASTECTOMY PARTIAL / LUMPECTOMY W/ AXILLARY LYMPHADENECTOMY  04/10/1989   Dr Annamaria Boots / left breast  . PARTIAL HYSTERECTOMY     vaginal  . TENDON TRANSFER Right 12/17/2017    Procedure: ABDUCTOR POLLICIS LONGUS TRANSFER;  Surgeon: Daryll Brod, MD;  Location: [place];  Service: Orthopedics;  Laterality: Right;  . TONSILLECTOMY AND ADENOIDECTOMY    . TOTAL HIP ARTHROPLASTY Right 08/05/2016   Procedure: TOTAL HIP ARTHROPLASTY ANTERIOR APPROACH;  Surgeon: [name], MD;  Location: [place];  Service: Orthopedics;  Laterality: Right;  . UPPER GASTROINTESTINAL ENDOSCOPY    . VIDEO BRONCHOSCOPY WITH ENDOBRONCHIAL NAVIGATION N/A 11/27/2016   Procedure: VIDEO BRONCHOSCOPY WITH ENDOBRONCHIAL NAVIGATION;  Surgeon: [name], MD;  Location: [place];  Service: Thoracic;  Laterality: N/A;    Prior to Admission medications   Medication Sig Start Date End Date Taking? Authorizing Provider  acetaminophen (TYLENOL) 500 MG tablet Take 1,000 mg by mouth 2 (two) times daily.     [provider]  albuterol (PROAIR HFA) 108 (90 Base) MCG/ACT inhaler Inhale 1-2 puffs into the lungs every 6 (six) hours as needed for wheezing or shortness of breath. 07/04/19   Martyn Ehrich, NP  aspirin EC 81 MG tablet Take 81 mg by mouth daily.    [provider]  atorvastatin (LIPITOR) 20 MG tablet TAKE 1 TABLET BY MOUTH EVERY DAY 05/16/19   End, Harrell Gave, MD  Biotin 10000 MCG TABS Take 1 tablet by mouth daily.    [provider]  Calcium Carbonate-Vitamin D (CALCIUM 600 + D PO) Take 1 tablet by mouth at bedtime.     [provider]  Carboxymethylcell-Hypromellose (GENTEAL OP) Place 1 drop into both eyes at bedtime.    [provider]  fexofenadine (ALLEGRA) 180 MG tablet Take 180 mg by mouth daily.    [provider]  fish oil-omega-3 fatty acids 1000 MG capsule Take 1 g by mouth 2 (two) times daily.     [provider]  gabapentin (NEURONTIN) 300 MG capsule Take 300 mg by mouth 4 (four) times daily.     [provider]  Glucosamine-Chondroit-Vit C-Mn (GLUCOSAMINE CHONDR 1500 COMPLX PO) Take 1 tablet  by mouth 2 (two) times daily.    [provider]  hyoscyamine (LEVBID) 0.375 MG 12 hr tablet Take 0.375 mg by mouth every 12 (twelve) hours as needed for cramping (IBS).     [provider]  isosorbide mononitrate (IMDUR) 30 MG 24 hr tablet Take 1 tablet (30 mg total) by mouth daily. 07/26/19 10/24/19  Rise Mu, PA-C  Multiple Vitamin (MULTIVITAMIN) tablet Take 1 tablet by mouth daily.      [provider]  umeclidinium-vilanterol (ANORO ELLIPTA) 62.5-25 MCG/INH AEPB Inhale 1 puff into the lungs daily. 04/20/19   Martyn Ehrich, NP  valsartan-hydrochlorothiazide (DIOVAN-HCT) 80-12.5 MG per tablet Take 1 tablet by mouth daily.      [provider]    Allergies Hydromorphone, Azithromycin, Ciprofloxacin, Hydromorphone hcl, Levofloxacin, and Lisinopril  Family History  Problem Relation Age of  Onset  . Emphysema Father        deceased  . Heart disease Mother        deceased  . Colon cancer Paternal Grandmother   . Colon cancer Son   . Colon cancer Paternal Uncle     Social History Social History   Tobacco Use  . Smoking status: Former Smoker    Years: 30.00    Types: Cigarettes    Quit date: 07/31/1989    Years since quitting: 30.0  . Smokeless tobacco: Never Used  Substance Use Topics  . Alcohol use: Yes    Alcohol/week: 1.0 standard drinks    Types: 1 Glasses of wine per week    Comment: occasional wine  . Drug use: No    Review of Systems  Constitutional: Negative for fever. Eyes: Negative for visual changes. ENT: Negative for sore throat. Neck: No neck pain  Cardiovascular: Negative for chest pain. + funny feeling in her chest Respiratory: Negative for shortness of breath. Gastrointestinal: Negative for abdominal pain, vomiting or diarrhea. Genitourinary: Negative for dysuria. Musculoskeletal: Negative for back pain. Skin: Negative for rash. Neurological: Negative for headaches, weakness or numbness. Psych: No SI or HI   ____________________________________________   PHYSICAL EXAM:  VITAL SIGNS: ED Triage Vitals  Enc Vitals Group     BP 08/04/19 0003 (!) 151/74     Pulse Rate 08/04/19 0003 (!) 56     Resp 08/04/19 0003 17     Temp 08/04/19 0003 97.6 F (36.4 C)     Temp Source 08/04/19 0003 Oral     SpO2 08/04/19 0003 96 %     Weight 08/04/19 0000 143 lb 15.4 oz (65.3 kg)     Height 08/04/19 0000 5\' 3"  (1.6 m)     Head Circumference --      Peak Flow --      Pain Score 08/03/19 2359 0     Pain Loc --      Pain Edu? --      Excl. in [place]? --     Constitutional: Alert and oriented. Well appearing and in no apparent distress. HEENT:      Head: Normocephalic and atraumatic.         Eyes: Conjunctivae are normal. Sclera is non-icteric.       Mouth/Throat: Mucous membranes are moist.       Neck: Supple with no signs of meningismus. Cardiovascular: Regular rate and rhythm. No murmurs, gallops, or rubs. 2+ symmetrical distal pulses are present in all extremities. No JVD. Respiratory: Normal respiratory effort. Lungs are clear to auscultation bilaterally. No wheezes, crackles, or rhonchi.  Gastrointestinal: Soft, non tender, and non distended with positive bowel sounds. No rebound or guarding. Musculoskeletal: Nontender with normal range of motion in all extremities. No edema, cyanosis, or erythema of extremities. Neurologic: Normal speech and language. Face is symmetric. Moving all extremities. No gross focal neurologic deficits are appreciated. Skin: Skin is warm, dry and intact. No rash noted. Psychiatric: Mood and affect are normal. Speech and behavior are normal.  ____________________________________________   LABS (all labs ordered are listed, but only abnormal results are displayed)  Labs Reviewed  CBC WITH DIFFERENTIAL/PLATELET - Abnormal; Notable for the following components:      Result Value   RBC 3.67 (*)    MCV 103.0 (*)    Platelets 137 (*)    All other components within normal  limits  COMPREHENSIVE METABOLIC PANEL - Abnormal; Notable for the following components:  Glucose, Bld 103 (*)    BUN 33 (*)    Creatinine, Ser 1.06 (*)    Calcium 10.5 (*)    GFR calc non Af Amer 50 (*)    GFR calc Af Amer 58 (*)    All other components within normal limits  TROPONIN I (HIGH SENSITIVITY)  TROPONIN I (HIGH SENSITIVITY)   ____________________________________________  EKG  ED ECG REPORT I, Rudene Re, the attending physician, personally viewed and interpreted this ECG.  Normal sinus rhythm with rate of 63, LVH with repol abnormalities, no ST elevation.  Unchanged from prior. ____________________________________________  RADIOLOGY  I have personally reviewed the images performed during this visit and I agree with the Radiologist's read.   Interpretation by Radiologist:  Dg Chest 2 View  Result Date: 08/04/2019 CLINICAL DATA:  Chest palpitations. EXAM: CHEST - 2 VIEW COMPARISON:  07/21/2019 FINDINGS: The lungs are hyperinflated with diffuse interstitial prominence. No focal airspace consolidation or pulmonary edema. No pleural effusion or pneumothorax. Normal cardiomediastinal contours. Remote median sternotomy. IMPRESSION: 1. No active cardiopulmonary disease. 2. COPD Electronically Signed   By: Ulyses Jarred M.D.   On: 08/04/2019 01:58     ____________________________________________   PROCEDURES  Procedure(s) performed: None Procedures Critical Care performed:  None ____________________________________________   INITIAL IMPRESSION / ASSESSMENT AND PLAN / ED COURSE   78 y.o. female with history of remote breast cancer, CAD status post stenting and CABGs, COPD, hypertension, hyperlipidemia who presents for evaluation of funny feeling in her chest.  Obviously based on patient's history, the fact that this is her second visit to the emergency room within 2 weeks for chest pain, and no interventional studies over the last several years, I am concerned  about ACS.  EKG showing LVH with repol abnormalities but no acute ischemic changes. Troponin x 2 negative,. CXR negative for edema, PNA, PTX. Low suspicion for PE with resolved symptoms, no tachycardia, tachypnea, hypoxia, or SOB.  Patient given ASA. Recommended admission but patient declined and will follow up with her cardiologist tomorrow. Patient is afraid of being hospitalized during [place] pandemic especially since she has h/o COPD. She will contact her cardiologist tomorrow. He had recommended a stress test 2 weeks ago and she is now amenable to have it done as outpatient. I explained to her the risks of outpatient f/u including delayed testing, recurrent CP, massive heart attack, death. Patient understands theses risks. Recommended return to the ER if she has any further episodes of chest discomfort or pain.        As part of my medical decision making, I reviewed the following data within the [facility] notes reviewed and incorporated, Labs reviewed , EKG interpreted , Old EKG reviewed, Old chart reviewed, Radiograph reviewed  Notes from prior ED visits and [place] Controlled Substance Database   Patient was evaluated in Emergency Department today for the symptoms described in the history of present illness. Patient was evaluated in the context of the global COVID-19 pandemic, which necessitated consideration that the patient might be at risk for infection with the SARS-CoV-2 virus that causes COVID-19. Institutional protocols and algorithms that pertain to the evaluation of patients at risk for COVID-19 are in a state of rapid change based on information released by regulatory bodies including the CDC and federal and state organizations. These policies and algorithms were followed during the patient's care in the ED.   ____________________________________________   FINAL CLINICAL IMPRESSION(S) / ED DIAGNOSES   Final diagnoses:  Chest pain,  unspecified type       NEW MEDICATIONS STARTED DURING THIS VISIT:  ED Discharge Orders    None       Note:  This document was prepared using Dragon voice recognition software and may include unintentional dictation errors.    Rudene Re, MD 08/04/19 [phone]

## 2019-08-04 NOTE — Telephone Encounter (Signed)
COVID Test Cx.

## 2019-08-05 ENCOUNTER — Encounter
Admission: RE | Admit: 2019-08-05 | Discharge: 2019-08-05 | Disposition: A | Discharge status: Home / Self Care | Payer: PPO | Source: Ambulatory Visit | Attending: Physician Assistant | Admitting: Physician Assistant

## 2019-08-05 DIAGNOSIS — N289 Disorder of kidney and ureter, unspecified: Secondary | ICD-10-CM | POA: Diagnosis not present

## 2019-08-05 DIAGNOSIS — R0602 Shortness of breath: Secondary | ICD-10-CM | POA: Diagnosis not present

## 2019-08-05 LAB — NM MYOCAR MULTI W/SPECT W/WALL MOTION / EF
LV dias vol: 63 mL (ref 46–106)
LV sys vol: 21 mL
Peak HR: 85 {beats}/min
Percent HR: 60 %
Rest HR: 70 {beats}/min
SDS: 0
SRS: 1
SSS: 0
TID: 0.95

## 2019-08-05 MED ORDER — TECHNETIUM TC 99M TETROFOSMIN IV KIT
32.6600 | PACK | Freq: Once | INTRAVENOUS | Status: AC | PRN
  Administered 2019-08-05: 09:00:00 32.66 via INTRAVENOUS

## 2019-08-05 MED ORDER — REGADENOSON 0.4 MG/5ML IV SOLN
0.4000 mg | Freq: Once | INTRAVENOUS | Status: AC
  Administered 2019-08-05: 0.4 mg via INTRAVENOUS
  Filled 2019-08-05: qty 5

## 2019-08-05 MED ORDER — TECHNETIUM TC 99M TETROFOSMIN IV KIT
10.0000 | PACK | Freq: Once | INTRAVENOUS | Status: AC | PRN
  Administered 2019-08-05: 10.68 via INTRAVENOUS

## 2019-08-05 NOTE — Telephone Encounter (Signed)
Scheduled 11/6 End

## 2019-08-08 ENCOUNTER — Telehealth: Payer: Self-pay | Admitting: Physician Assistant

## 2019-08-08 NOTE — Telephone Encounter (Signed)
Pt c/o medication issue:  1. Name of Medication: Imdur  2. How are you currently taking this medication (dosage and times per day)? 30mg  once a day  3. Are you having a reaction (difficulty breathing--STAT)? no  4. What is your medication issue? Thinks it is affecting her BP States it dropped to 92/50. Stopped her Valsartan  And increased to 150/88. Please call to discuss Imdur.

## 2019-08-09 ENCOUNTER — Other Ambulatory Visit (HOSPITAL_COMMUNITY): Payer: PPO

## 2019-08-09 ENCOUNTER — Telehealth: Payer: Self-pay | Admitting: Physician Assistant

## 2019-08-09 NOTE — Telephone Encounter (Signed)
I spoke with the patient.  She states that she has been having intermittent "chest pressure." The first episode, she took an antiacid and this seemed to help after about 15 minutes she was able to lay down and sleep all night.  The second episode, she went to the [facility] ER for a workup, which was negative for a cardiac event.  She saw [name], PA on 10/20 and was advised to start imdur 30 mg once daily. The patient states she was told by Thurmond Butts to only start this if she had another "episode."  The patient had another "episode" on 10/29 and went to the ER at [facility]. She states she was not having chest pressure, but was having "chest fullness."  She states she had been told by Thurmond Butts to stop valsartan a few days after starting imdur as her BP dropped down in the 90's.  I advised I do not see documentation of this in her chart.  She called today mainly wanting to know about getting off of the imdur as she feels her symptoms are not cardiac related and wanted Ryan's thoughts on this.   She states her BP was also creeping up to the [id number] systolic off the valsartan/hctz, so she restarted this back on her own yesterday. Confirmed with the patient that she took imdur 30 mg once daily & valsartan/hctz 80/12.5 mg once daily yesterday morning at ~ 8:00 am. Her after BP was 120/74. She was not convinced this was a result of the valsartan and she did not think this had time to really get back in her sytem.  I advised her I would need to review with Thurmond Butts what he would recommend with her meds and if he felt she needed a daily PPI for the short term.  Per the patient, she is requesting a call back from [facility] only as she needed to ask him a few questions about her visit with Dr. Rockey Situ I advised Thurmond Butts is in clinic all day today and I am unsure if he would be able to call her back or if so, what time.  She is requesting a call back on her cell # [phone].  To [place], [place] to review.

## 2019-08-09 NOTE — Telephone Encounter (Signed)
-----   Message from Rise Mu, PA-C sent at 08/07/2019  8:21 AM EST ----- Stress test showed no significant ischemia with normal wall motion.  PACs/PVCs were noted incidentally.  CT images showed aortic atherosclerosis and coronary artery calcification in the LAD.  Overall, this was a low risk scan.   Most recent LDL from 2019 of 67. I would like for her to come to the [facility], when it is convenient for her and have a fasting lipid and liver function checked to ensure her LDL remains < 70 given coronary artery calcification noted.

## 2019-08-09 NOTE — Telephone Encounter (Signed)
Spoke with patient.  She had some generalized questions regarding the LAD calcification and EF reported on her Myoview.  No further chest pain.  She would like to discontinue isosorbide.  Advised her she can discontinue this medication without tapering.  She will continue Diovan HCT.  She has follow-up with Dr. Saunders Revel later this week.

## 2019-08-09 NOTE — Telephone Encounter (Signed)
Call attempted, no answer, unable to leave voicemail.  Please instruct patient she cannot discontinue isosorbide mononitrate and resume her previously dosed valsartan/HCTZ.  She should contact us or PCPs office if BP remains elevated.

## 2019-08-09 NOTE — Telephone Encounter (Signed)
Call attempted. No answer, NV>

## 2019-08-09 NOTE — Telephone Encounter (Signed)
Discussing case with [name], PA.  He reports that pt is okay to stop imdur. Typo noted. Can stop imdur and resume previous of valsartan hctz.  Will call patient back and let her know.   Called patient and spoke with her. She had further questions and preferred Ryan to call her back. Made Ryan aware.

## 2019-08-09 NOTE — Telephone Encounter (Signed)
Attempted to call patient once on cell phone and twice on home number after being asked by clinical staff. LMOVM to CB.

## 2019-08-10 MED ORDER — VALSARTAN-HYDROCHLOROTHIAZIDE 80-12.5 MG PO TABS: 1.0000 | ORAL_TABLET | Freq: Every day | ORAL | 3 refills | Status: DC

## 2019-08-12 ENCOUNTER — Ambulatory Visit (INDEPENDENT_AMBULATORY_CARE_PROVIDER_SITE_OTHER): Payer: PPO | Admitting: Internal Medicine

## 2019-08-12 ENCOUNTER — Encounter: Payer: Self-pay | Admitting: Internal Medicine

## 2019-08-12 ENCOUNTER — Other Ambulatory Visit: Payer: Self-pay

## 2019-08-12 ENCOUNTER — Ambulatory Visit: Payer: PPO | Admitting: Internal Medicine

## 2019-08-12 VITALS — BP 140/62 | HR 71 | Ht 63.0 in | Wt 142.5 lb

## 2019-08-12 DIAGNOSIS — I10 Essential (primary) hypertension: Secondary | ICD-10-CM

## 2019-08-12 DIAGNOSIS — E785 Hyperlipidemia, unspecified: Secondary | ICD-10-CM | POA: Diagnosis not present

## 2019-08-12 DIAGNOSIS — I25119 Atherosclerotic heart disease of native coronary artery with unspecified angina pectoris: Secondary | ICD-10-CM

## 2019-08-12 NOTE — Patient Instructions (Signed)
Medication Instructions:  Your physician recommends that you continue on your current medications as directed. Please refer to the Current Medication list given to you today.  *If you need a refill on your cardiac medications before your next appointment, please call your pharmacy*  Lab Work: none If you have labs (blood work) drawn today and your tests are completely normal, you will receive your results only by: Marland Kitchen MyChart Message (if you have MyChart) OR . A paper copy in the mail If you have any lab test that is abnormal or we need to change your treatment, we will call you to review the results.  Testing/Procedures: none  Follow-Up: At [facility], you and your health needs are our priority.  As part of our continuing mission to provide you with exceptional heart care, we have created designated Provider Care Teams.  These Care Teams include your primary Cardiologist (physician) and Advanced Practice Providers (APPs -  Physician Assistants and Nurse Practitioners) who all work together to provide you with the care you need, when you need it.  Your next appointment:   Keep appointment as scheduled.   The format for your next appointment:   In Person  Provider:    You may see [name], MD or one of the following Advanced Practice Providers on your designated Care Team:    Murray Hodgkins, NP  [name], PA-C  Marrianne Mood, PA-C

## 2019-08-12 NOTE — Progress Notes (Signed)
Follow-up Outpatient Visit Date: 08/12/2019  Primary Care Provider: Lavone Orn, MD [place] [address] [place] [address] [place] [address]  Chief Complaint: Follow-up chest pain  HPI:  Tiffany Caldwell is a 78 y.o. year-old female with history of coronary artery disease status post remote PCI and subsequent emergent CABGfor iatrogenicLMCA/LADdissection in 2001, hypertension, hyperlipidemia,andright lower lobe cavitary lung mass, who presents for follow-up of chest pain.  She was last seen in our office on 07/26/2019 by [name], PA, to the [facility] emergency department for chest pain.  She was feeling well at the time of her visit with [name].  She presented to the [facility] emergency department a week later with a "funny feeling in her chest" the following week.  Symptoms did not abate after sublingual nitroglycerin.  ED work-up was unrevealing.  Subsequent myocardial perfusion stress test was low risk without ischemia or scar.  Today, Tiffany Caldwell reports that she has not had any further episodes of chest pain.  She had been started on isosorbide mononitrate by [name] but did not tolerate this due to soft blood pressure.  She is back on valsartan-HCTZ alone without long-acting nitrate therapy.  Her chronic shortness of breath, which she attributes to COPD, is unchanged from her baseline.  She denies palpitations, lightheadedness, and edema.  Home blood pressures over the last few days have been 130-140/60-80.  --------------------------------------------------------------------------------------------------  Cardiovascular History & Procedures: Cardiovascular Problems:  Coronary artery disease status post PCI and subsequent CABG (iatrogenic LMCA dissection)  Risk Factors:  Known coronary artery disease, hypertension, hyperlipidemia, and age  Cath/PCI:  LHC/PCI(12/16/99): LMCA normal. LAD with 80 and 90% mid vessel stenosis. LCx without significant disease. RCA with 30% proximal  stenosis. Successful PCI to mid LAD with a NIR Royale3.0 x 15 mm bare-metal stent.  LHC/PCI (05/28/00): LMCA normal. LAD with 80-90% mid vessel in-stent restenosis. LCx normal. RCA with 30-40% proximal as well as ostial spasm. Plan angioplasty of previous stent complicated by proximal LAD dissection with placement ofNIR Elite 3.5 x 12 mmbare-metal stent. Further propagation of the dissection to the [facility] referral for urgent CABG.  CV Surgery:  Urgent CABG (05/28/00): LIMA to LAD, SVG to diagonal, SVG to OM.  EP Procedures and Devices:  None  Non-Invasive Evaluation(s):  Pharmacologic MPI (08/05/2019): Low risk study without ischemia or scar.  LVEF greater than 65%.  Aortic atherosclerosis and coronary artery calcification noted.  TTE (11/08/14): Normal LV size with mild concentric LVH. LVEF 55-60% without regional wall motion abnormalities. Grade 1 diastolic dysfunction. Left atrium is severely dilated. RV size is normal with normal contraction. Mild TR. Otherwise no significant valvular abnormalities.  Recent CV Pertinent Labs: Lab Results  Component Value Date   CHOL 155 12/08/2017   HDL 72 12/08/2017   LDLCALC 67 12/08/2017   TRIG 79 12/08/2017   CHOLHDL 2.2 12/08/2017   INR 1.06 11/20/2016   K 3.8 08/04/2019   BUN 33 (H) 08/04/2019   CREATININE 1.06 (H) 08/04/2019    Past medical and surgical history were reviewed and updated in EPIC.  Current Meds  Medication Sig  . acetaminophen (TYLENOL) 500 MG tablet Take 1,000 mg by mouth 2 (two) times daily.   Marland Kitchen albuterol (PROAIR HFA) 108 (90 Base) MCG/ACT inhaler Inhale 1-2 puffs into the lungs every 6 (six) hours as needed for wheezing or shortness of breath.  Marland Kitchen aspirin EC 81 MG tablet Take 81 mg by mouth daily.  Marland Kitchen atorvastatin (LIPITOR) 20 MG tablet TAKE 1 TABLET BY  MOUTH EVERY DAY  . Biotin 10000 MCG TABS Take 1 tablet by mouth daily.  . Calcium Carbonate-Vitamin D (CALCIUM 600 + D PO) Take 1 tablet by mouth  daily.   . Carboxymethylcell-Hypromellose (GENTEAL OP) Place 1 drop into both eyes at bedtime.  . fexofenadine (ALLEGRA) 180 MG tablet Take 180 mg by mouth daily.  . fish oil-omega-3 fatty acids 1000 MG capsule Take 1 g by mouth 2 (two) times daily.   Marland Kitchen gabapentin (NEURONTIN) 300 MG capsule Take 300 mg by mouth 4 (four) times daily.   . Glucosamine-Chondroit-Vit C-Mn (GLUCOSAMINE CHONDR 1500 COMPLX PO) Take 1 tablet by mouth 2 (two) times daily.  . hyoscyamine (LEVBID) 0.375 MG 12 hr tablet Take 0.375 mg by mouth every 12 (twelve) hours as needed for cramping (IBS).   . Multiple Vitamin (MULTIVITAMIN) tablet Take 1 tablet by mouth daily.    Marland Kitchen umeclidinium-vilanterol (ANORO ELLIPTA) 62.5-25 MCG/INH AEPB Inhale 1 puff into the lungs daily.  . valsartan-hydrochlorothiazide (DIOVAN-HCT) 80-12.5 MG tablet Take 1 tablet by mouth daily.    Allergies: Hydromorphone, Azithromycin, Ciprofloxacin, Hydromorphone hcl, Levofloxacin, and Lisinopril  Social History   Tobacco Use  . Smoking status: Former Smoker    Years: 30.00    Types: Cigarettes    Quit date: 07/31/1989    Years since quitting: 30.0  . Smokeless tobacco: Never Used  Substance Use Topics  . Alcohol use: Yes    Alcohol/week: 1.0 standard drinks    Types: 1 Glasses of wine per week    Comment: occasional wine  . Drug use: No    Family History  Problem Relation Age of Onset  . Emphysema Father        deceased  . Heart disease Mother        deceased  . Colon cancer Paternal Grandmother   . Colon cancer Son   . Colon cancer Paternal Uncle     Review of Systems: A 12-system review of systems was performed and was negative except as noted in the HPI.  --------------------------------------------------------------------------------------------------  Physical Exam: BP 140/62 (BP Location: Left Arm, Patient Position: Sitting, Cuff Size: Normal)   Pulse 71   Ht 5\' 3"  (1.6 m)   Wt 142 lb 8 oz (64.6 kg)   LMP  (LMP Unknown)    SpO2 98%   BMI 25.24 kg/m   General: NAD. HEENT: No conjunctival pallor or scleral icterus.  Facemask in place. Neck: Supple without lymphadenopathy, thyromegaly, JVD, or HJR. Lungs: Normal work of breathing. Clear to auscultation bilaterally without wheezes or crackles. Heart: Regular rate and rhythm without murmurs, rubs, or gallops. Non-displaced PMI. Abd: Bowel sounds present. Soft, NT/ND without hepatosplenomegaly Ext: No lower extremity edema. Radial, PT, and DP pulses are 2+ bilaterally. Skin: Warm and dry without rash.  EKG: Normal sinus rhythm with PACs, LVH, and abnormal repolarization.  No significant change since 08/04/2019.  Lab Results  Component Value Date   WBC 4.0 08/04/2019   HGB 12.2 08/04/2019   HCT 37.8 08/04/2019   MCV 103.0 (H) 08/04/2019   PLT 137 (L) 08/04/2019    Lab Results  Component Value Date   NA 142 08/04/2019   K 3.8 08/04/2019   CL 103 08/04/2019   CO2 30 08/04/2019   BUN 33 (H) 08/04/2019   CREATININE 1.06 (H) 08/04/2019   GLUCOSE 103 (H) 08/04/2019   ALT 25 08/04/2019    Lab Results  Component Value Date   CHOL 155 12/08/2017   HDL 72 12/08/2017  [place] 67 12/08/2017   TRIG 79 12/08/2017   CHOLHDL 2.2 12/08/2017    --------------------------------------------------------------------------------------------------  ASSESSMENT AND PLAN: Coronary artery disease with atypical angina: Ms. Housley has not had any further episodes of chest pain since her ED visit last month.  Subsequent myocardial perfusion stress test was low risk without ischemia or scar.  I suspect that her symptoms are noncardiac in nature.  We have agreed to defer additional testing and medication changes at this time.  Use of over-the-counter antacid could be considered in case GERD is playing a role.  If symptoms continue, we may need to consider coronary angiography in the past, though we both would like to avoid this if possible given history of iatrogenic  LAD/LMCA dissection in the setting of LAD intervention 19 years ago.  Hypertension: Blood pressure borderline today but better at home.  Readings were actually low in the setting of valsartan-HCTZ and concurrent isosorbide mononitrate use.  We will continue current dose of valsartan-HCTZ.  Hyperlipidemia: LDL at goal on most recent check.  Continue atorvastatin 20 mg daily.  Follow-up: Return to clinic in 2 to 3 months as previously scheduled.  [name], MD 08/12/2019 3:00 PM

## 2019-08-14 ENCOUNTER — Encounter: Payer: Self-pay | Admitting: Internal Medicine

## 2019-08-29 ENCOUNTER — Telehealth: Payer: Self-pay | Admitting: Internal Medicine

## 2019-08-29 ENCOUNTER — Other Ambulatory Visit: Payer: Self-pay

## 2019-08-29 MED ORDER — ATORVASTATIN CALCIUM 20 MG PO TABS: 20.0000 mg | ORAL_TABLET | Freq: Every day | ORAL | 0 refills | Status: DC

## 2019-08-29 NOTE — Telephone Encounter (Signed)
°*  STAT* If patient is at the pharmacy, call can be transferred to refill team.   1. Which medications need to be refilled? (please list name of each medication and dose if known)   Lipitor 20 mg po q g     2. Which pharmacy/location (including street and city if local pharmacy) is medication to be sent to? [facility] [address] [place]  3. Do they need a 30 day or 90 day supply?  [place]

## 2019-08-29 NOTE — Telephone Encounter (Signed)
atorvastatin (LIPITOR) 20 MG tablet 30 tablet 0 08/29/2019    Sig - Route: Take 1 tablet (20 mg total) by mouth daily. - Oral   Sent to pharmacy as: atorvastatin (LIPITOR) 20 MG tablet   E-Prescribing Status: Receipt confirmed by pharmacy (08/29/2019 1:21 PM EST)   Pharmacy  [id number] - [place], [place]

## 2019-08-31 DIAGNOSIS — M19039 Primary osteoarthritis, unspecified wrist: Secondary | ICD-10-CM | POA: Diagnosis not present

## 2019-08-31 DIAGNOSIS — J449 Chronic obstructive pulmonary disease, unspecified: Secondary | ICD-10-CM | POA: Diagnosis not present

## 2019-08-31 DIAGNOSIS — E7800 Pure hypercholesterolemia, unspecified: Secondary | ICD-10-CM | POA: Diagnosis not present

## 2019-08-31 DIAGNOSIS — I10 Essential (primary) hypertension: Secondary | ICD-10-CM | POA: Diagnosis not present

## 2019-08-31 DIAGNOSIS — Z853 Personal history of malignant neoplasm of breast: Secondary | ICD-10-CM | POA: Diagnosis not present

## 2019-08-31 DIAGNOSIS — I25810 Atherosclerosis of coronary artery bypass graft(s) without angina pectoris: Secondary | ICD-10-CM | POA: Diagnosis not present

## 2019-09-05 ENCOUNTER — Ambulatory Visit: Payer: PPO | Admitting: Emergency Medicine

## 2019-09-16 ENCOUNTER — Other Ambulatory Visit: Payer: Self-pay | Admitting: Internal Medicine

## 2019-09-16 DIAGNOSIS — J449 Chronic obstructive pulmonary disease, unspecified: Secondary | ICD-10-CM | POA: Diagnosis not present

## 2019-09-16 DIAGNOSIS — I25810 Atherosclerosis of coronary artery bypass graft(s) without angina pectoris: Secondary | ICD-10-CM | POA: Diagnosis not present

## 2019-09-16 DIAGNOSIS — E7800 Pure hypercholesterolemia, unspecified: Secondary | ICD-10-CM | POA: Diagnosis not present

## 2019-09-16 DIAGNOSIS — I10 Essential (primary) hypertension: Secondary | ICD-10-CM | POA: Diagnosis not present

## 2019-09-16 DIAGNOSIS — M19039 Primary osteoarthritis, unspecified wrist: Secondary | ICD-10-CM | POA: Diagnosis not present

## 2019-09-16 DIAGNOSIS — Z853 Personal history of malignant neoplasm of breast: Secondary | ICD-10-CM | POA: Diagnosis not present

## 2019-10-17 ENCOUNTER — Ambulatory Visit: Payer: PPO | Admitting: Emergency Medicine

## 2019-10-26 ENCOUNTER — Ambulatory Visit: Payer: PPO | Admitting: Internal Medicine

## 2019-11-07 ENCOUNTER — Other Ambulatory Visit: Payer: Self-pay | Admitting: Primary Care

## 2019-11-23 ENCOUNTER — Other Ambulatory Visit: Payer: Self-pay

## 2019-11-23 ENCOUNTER — Encounter: Payer: Self-pay | Admitting: Internal Medicine

## 2019-11-23 ENCOUNTER — Ambulatory Visit: Payer: PPO | Admitting: Internal Medicine

## 2019-11-23 VITALS — BP 130/80 | Ht 66.0 in | Wt 141.8 lb

## 2019-11-23 DIAGNOSIS — I2510 Atherosclerotic heart disease of native coronary artery without angina pectoris: Secondary | ICD-10-CM

## 2019-11-23 DIAGNOSIS — Z853 Personal history of malignant neoplasm of breast: Secondary | ICD-10-CM | POA: Diagnosis not present

## 2019-11-23 DIAGNOSIS — E7800 Pure hypercholesterolemia, unspecified: Secondary | ICD-10-CM | POA: Diagnosis not present

## 2019-11-23 DIAGNOSIS — I10 Essential (primary) hypertension: Secondary | ICD-10-CM

## 2019-11-23 DIAGNOSIS — E785 Hyperlipidemia, unspecified: Secondary | ICD-10-CM

## 2019-11-23 DIAGNOSIS — I25810 Atherosclerosis of coronary artery bypass graft(s) without angina pectoris: Secondary | ICD-10-CM | POA: Diagnosis not present

## 2019-11-23 DIAGNOSIS — M19039 Primary osteoarthritis, unspecified wrist: Secondary | ICD-10-CM | POA: Diagnosis not present

## 2019-11-23 DIAGNOSIS — J449 Chronic obstructive pulmonary disease, unspecified: Secondary | ICD-10-CM | POA: Diagnosis not present

## 2019-11-23 NOTE — Progress Notes (Signed)
Follow-up Outpatient Visit Date: 11/23/2019  Primary Care Provider: Lavone Orn, MD [place] [address] [place] [address]  Chief Complaint: Follow-up CAD  HPI:  Tiffany Caldwell is a 79 y.o. female with history of coronary artery disease status post remote PCI and subsequent emergent CABGfor iatrogenicLMCA/LADdissection in 2001, hypertension, hyperlipidemia,andright lower lobe cavitary lung mass, who presents for follow-up of pain.  I last saw her in 08/2019, at which time she denied further episodes of chest pain.  She had previously been intolerant of isosorbide mononitrate due to hypotension.  Chronic shortness of breath, which she attributed to COPD, had been stable.  We agreed to defer additional testing and intervention at that time.  Today, Tiffany Caldwell reports feeling well.  She received her COVID-19 vaccines over the last few weeks and noted significant fatigue and subjective fevers for 1-2 days after the second dose.  She is back to baseline and denies chest pain, palpitations, lightheadedness, and edema.  Chronic dyspnea on exertion is unchanged from baseline.  Blood pressure has been well-controlled, with home readings of 110-120/60-70.  --------------------------------------------------------------------------------------------------  Cardiovascular History & Procedures: Cardiovascular Problems:  Coronary artery disease status post PCI and subsequent CABG (iatrogenic LMCA dissection)  Risk Factors:  Known coronary artery disease, hypertension, hyperlipidemia, and age  Cath/PCI:  LHC/PCI(12/16/99): LMCA normal. LAD with 80 and 90% mid vessel stenosis. LCx without significant disease. RCA with 30% proximal stenosis. Successful PCI to mid LAD with a NIR Royale3.0 x 15 mm bare-metal stent.  LHC/PCI (05/28/00): LMCA normal. LAD with 80-90% mid vessel in-stent restenosis. LCx normal. RCA with 30-40% proximal as well as ostial spasm. Plan angioplasty of previous  stent complicated by proximal LAD dissection with placement ofNIR Elite 3.5 x 12 mmbare-metal stent. Further propagation of the dissection to the [facility] referral for urgent CABG.  CV Surgery:  Urgent CABG (05/28/00): LIMA to LAD, SVG to diagonal, SVG to OM.  EP Procedures and Devices:  None  Non-Invasive Evaluation(s):  Pharmacologic MPI (08/05/2019): Low risk study without ischemia or scar.  LVEF greater than 65%.  Aortic atherosclerosis and coronary artery calcification noted.  TTE (11/08/14): Normal LV size with mild concentric LVH. LVEF 55-60% without regional wall motion abnormalities. Grade 1 diastolic dysfunction. Left atrium is severely dilated. RV size is normal with normal contraction. Mild TR. Otherwise no significant valvular abnormalities.  Recent CV Pertinent Labs: Lab Results  Component Value Date   CHOL 155 12/08/2017   HDL 72 12/08/2017   LDLCALC 67 12/08/2017   TRIG 79 12/08/2017   CHOLHDL 2.2 12/08/2017   INR 1.06 11/20/2016   K 3.8 08/04/2019   BUN 33 (H) 08/04/2019   CREATININE 1.06 (H) 08/04/2019    Past medical and surgical history were reviewed and updated in EPIC.  Current Meds  Medication Sig  . acetaminophen (TYLENOL) 500 MG tablet Take 1,000 mg by mouth 2 (two) times daily.   Marland Kitchen albuterol (PROAIR HFA) 108 (90 Base) MCG/ACT inhaler Inhale 1-2 puffs into the lungs every 6 (six) hours as needed for wheezing or shortness of breath.  Jearl Klinefelter ELLIPTA 62.5-25 MCG/INH AEPB TAKE 1 PUFF BY MOUTH EVERY DAY  . aspirin EC 81 MG tablet Take 81 mg by mouth daily.  Marland Kitchen atorvastatin (LIPITOR) 20 MG tablet TAKE 1 TABLET BY MOUTH EVERY DAY  . Biotin 10000 MCG TABS Take 1 tablet by mouth daily.  . Calcium Carbonate-Vitamin D (CALCIUM 600 + D PO) Take 1 tablet by mouth daily.   . Carboxymethylcell-Hypromellose (GENTEAL  OP) Place 1 drop into both eyes at bedtime.  . fexofenadine (ALLEGRA) 180 MG tablet Take 180 mg by mouth daily.  . fish oil-omega-3  fatty acids 1000 MG capsule Take 1 g by mouth daily.   Marland Kitchen gabapentin (NEURONTIN) 300 MG capsule Take 300 mg by mouth 4 (four) times daily.   . Glucosamine-Chondroit-Vit C-Mn (GLUCOSAMINE CHONDR 1500 COMPLX PO) Take 1 tablet by mouth 2 (two) times daily.  . hyoscyamine (LEVBID) 0.375 MG 12 hr tablet Take 0.375 mg by mouth every 12 (twelve) hours as needed for cramping (IBS).   . Multiple Vitamin (MULTIVITAMIN) tablet Take 1 tablet by mouth daily.    . nitroGLYCERIN (NITROSTAT) 0.4 MG SL tablet Place 0.4 mg under the tongue every 5 (five) minutes as needed for chest pain.  . valsartan-hydrochlorothiazide (DIOVAN-HCT) 80-12.5 MG tablet Take 1 tablet by mouth daily.    Allergies: Hydromorphone, Azithromycin, Ciprofloxacin, Hydromorphone hcl, Levofloxacin, and Lisinopril  Social History   Tobacco Use  . Smoking status: Former Smoker    Years: 30.00    Types: Cigarettes    Quit date: 07/31/1989    Years since quitting: 30.3  . Smokeless tobacco: Never Used  Substance Use Topics  . Alcohol use: Yes    Alcohol/week: 1.0 standard drinks    Types: 1 Glasses of wine per week    Comment: occasional wine  . Drug use: No    Family History  Problem Relation Age of Onset  . Emphysema Father        deceased  . Heart disease Mother        deceased  . Colon cancer Paternal Grandmother   . Colon cancer Son   . Colon cancer Paternal Uncle     Review of Systems: A 12-system review of systems was performed and was negative except as noted in the HPI.  --------------------------------------------------------------------------------------------------  Physical Exam: BP 130/80 (BP Location: Left Arm, Patient Position: Sitting, Cuff Size: Normal)   Ht 5\' 6"  (1.676 m)   Wt 141 lb 12 oz (64.3 kg)   LMP  (LMP Unknown)   SpO2 94%   BMI 22.88 kg/m   General:  NAD. Neck: Supple without lymphadenopathy, thyromegaly, JVD, or HJR. Lungs: Normal work of breathing. Mildly diminished breath sounds  throughout without wheezes or crackles. Heart: Regular rate and rhythm without murmurs, rubs, or gallops. Abd: Bowel sounds present. Soft, NT/ND without hepatosplenomegaly Ext: No lower extremity edema.  EKG:  NSR with PAC's and LVH with abnormal repolarization.  No significant change from prior tracings.  Lab Results  Component Value Date   WBC 4.0 08/04/2019   HGB 12.2 08/04/2019   HCT 37.8 08/04/2019   MCV 103.0 (H) 08/04/2019   PLT 137 (L) 08/04/2019    Lab Results  Component Value Date   NA 142 08/04/2019   K 3.8 08/04/2019   CL 103 08/04/2019   CO2 30 08/04/2019   BUN 33 (H) 08/04/2019   CREATININE 1.06 (H) 08/04/2019   GLUCOSE 103 (H) 08/04/2019   ALT 25 08/04/2019    Lab Results  Component Value Date   CHOL 155 12/08/2017   HDL 72 12/08/2017   LDLCALC 67 12/08/2017   TRIG 79 12/08/2017   CHOLHDL 2.2 12/08/2017    --------------------------------------------------------------------------------------------------  ASSESSMENT AND PLAN: Coronary artery disease: Stable dyspnea on exertion without new/worsening symptoms to suggest progression of coronary artery disease.  We will defer further testing at this time and continue secondary prevention with aspirin and atorvastatin.  Hypertension: Borderline blood  pressure in the office today, though home readings are typically better.  No medication changes at this time.  Hyperlipidemia: LDL at goal.  Continue atorvastatin 20 mg daily.  Follow-up: Return to clinic in 6 months.  [name], MD 11/23/2019 4:24 PM

## 2019-11-23 NOTE — Patient Instructions (Signed)
Medication Instructions:  Your physician recommends that you continue on your current medications as directed. Please refer to the Current Medication list given to you today.  *If you need a refill on your cardiac medications before your next appointment, please call your pharmacy*  Lab Work: none If you have labs (blood work) drawn today and your tests are completely normal, you will receive your results only by: Marland Kitchen MyChart Message (if you have MyChart) OR . A paper copy in the mail If you have any lab test that is abnormal or we need to change your treatment, we will call you to review the results.  Testing/Procedures: none  Follow-Up: At [facility], you and your health needs are our priority.  As part of our continuing mission to provide you with exceptional heart care, we have created designated Provider Care Teams.  These Care Teams include your primary Cardiologist (physician) and Advanced Practice Providers (APPs -  Physician Assistants and Nurse Practitioners) who all work together to provide you with the care you need, when you need it.  Your next appointment:   6 months.  The format for your next appointment:   In Person  Provider:    You may see [name], MD or one of the following Advanced Practice Providers on your designated Care Team:    Murray Hodgkins, NP  [name], PA-C  Marrianne Mood, PA-C

## 2019-11-25 ENCOUNTER — Other Ambulatory Visit
Admission: RE | Admit: 2019-11-25 | Discharge: 2019-11-25 | Disposition: A | Discharge status: Home / Self Care | Payer: PPO | Source: Ambulatory Visit | Attending: Emergency Medicine | Admitting: Emergency Medicine

## 2019-11-25 ENCOUNTER — Encounter: Payer: Self-pay | Admitting: Internal Medicine

## 2019-11-25 ENCOUNTER — Other Ambulatory Visit: Payer: Self-pay

## 2019-11-25 DIAGNOSIS — Z01812 Encounter for preprocedural laboratory examination: Secondary | ICD-10-CM | POA: Diagnosis not present

## 2019-11-25 DIAGNOSIS — Z20822 Contact with and (suspected) exposure to covid-19: Secondary | ICD-10-CM | POA: Diagnosis not present

## 2019-11-26 LAB — SARS CORONAVIRUS 2 (TAT 6-24 HRS): SARS Coronavirus 2: NEGATIVE

## 2019-11-29 ENCOUNTER — Other Ambulatory Visit: Payer: Self-pay | Admitting: *Deleted

## 2019-11-29 ENCOUNTER — Other Ambulatory Visit: Payer: Self-pay

## 2019-11-29 ENCOUNTER — Ambulatory Visit (INDEPENDENT_AMBULATORY_CARE_PROVIDER_SITE_OTHER): Payer: PPO | Admitting: Emergency Medicine

## 2019-11-29 DIAGNOSIS — J449 Chronic obstructive pulmonary disease, unspecified: Secondary | ICD-10-CM

## 2019-11-29 LAB — PULMONARY FUNCTION TEST
DL/VA % pred: 79 %
DL/VA: 3.24 ml/min/mmHg/L
DLCO unc % pred: 55 %
DLCO unc: 10.38 ml/min/mmHg
FEF 25-75 Post: 1.28 L/sec
FEF 25-75 Pre: 0.97 L/sec
FEF2575-%Change-Post: 31 %
FEF2575-%Pred-Post: 90 %
FEF2575-%Pred-Pre: 69 %
FEV1-%Change-Post: 10 %
FEV1-%Pred-Post: 80 %
FEV1-%Pred-Pre: 72 %
FEV1-Post: 1.56 L
FEV1-Pre: 1.41 L
FEV1FVC-%Change-Post: 4 %
FEV1FVC-%Pred-Pre: 93 %
FEV6-%Change-Post: 9 %
FEV6-%Pred-Post: 85 %
FEV6-%Pred-Pre: 78 %
FEV6-Post: 2.11 L
FEV6-Pre: 1.94 L
FEV6FVC-%Change-Post: 0 %
FEV6FVC-%Pred-Post: 104 %
FEV6FVC-%Pred-Pre: 104 %
FVC-%Change-Post: 5 %
FVC-%Pred-Post: 82 %
FVC-%Pred-Pre: 78 %
FVC-Post: 2.15 L
FVC-Pre: 2.04 L
Post FEV1/FVC ratio: 73 %
Post FEV6/FVC ratio: 99 %
Pre FEV1/FVC ratio: 69 %
Pre FEV6/FVC Ratio: 98 %
RV % pred: 59 %
RV: 1.43 L
TLC % pred: 66 %
TLC: 3.34 L

## 2019-11-29 NOTE — Progress Notes (Signed)
Full PFT performed today. °

## 2019-12-13 ENCOUNTER — Other Ambulatory Visit: Payer: Self-pay

## 2019-12-13 ENCOUNTER — Encounter: Payer: Self-pay | Admitting: Emergency Medicine

## 2019-12-13 ENCOUNTER — Ambulatory Visit: Payer: PPO | Admitting: Emergency Medicine

## 2019-12-13 ENCOUNTER — Telehealth: Payer: Self-pay | Admitting: Emergency Medicine

## 2019-12-13 DIAGNOSIS — A310 Pulmonary mycobacterial infection: Secondary | ICD-10-CM

## 2019-12-13 DIAGNOSIS — J449 Chronic obstructive pulmonary disease, unspecified: Secondary | ICD-10-CM

## 2019-12-13 MED ORDER — ATORVASTATIN CALCIUM 20 MG PO TABS: 20.0000 mg | ORAL_TABLET | Freq: Every day | ORAL | 0 refills | Status: DC

## 2019-12-13 MED ORDER — ANORO ELLIPTA 62.5-25 MCG/INH IN AEPB: INHALATION_SPRAY | RESPIRATORY_TRACT | 6 refills | Status: DC

## 2019-12-13 NOTE — Patient Instructions (Addendum)
We will follow your CT chest intermittently, plan to talk about timing next visit Please continue your Anoro once daily. Rinse and gargle after using Keep albuterol available to use 2 puffs up to every 4 hours if needed for shortness of breath, chest tightness, wheezing.  Follow with Dr. Lamonte Sakai in September or sooner if you have any problems.

## 2019-12-13 NOTE — Assessment & Plan Note (Signed)
Based on needle biopsies.  We have deferred treatment.  Her most recent CT chest from September 2020 was stable.  Unclear that we need to do annual CTs at this point, we can discuss timing of next imaging based on her clinical status at next visit.

## 2019-12-13 NOTE — Telephone Encounter (Signed)
Spoke with pt and she would like to have Anoro refills sent to [facility]. Refill sent. Pt verbalized understanding. Nothing further needed.

## 2019-12-13 NOTE — Progress Notes (Signed)
   Subjective:    Patient ID: Tiffany Caldwell, female    DOB: July 25, 1941, 79 y.o.   MRN: [id number]  HPI  ROV 12/13/2019 --79 year old woman with moderately severe COPD.  Also history of CAD/CABG, breast cancer (surgery), pulmonary nodular disease that proved to be Mycobacterium avium, treatment was deferred.  Since I last saw her she has been tried on initially [place] and then changed to [employer]. Her most recent CT chest was 06/22/2019 reviewed by me, showed stable right lower lobe mass lesion 3.5 cm and scattered pulmonary nodules that are all smaller.  No progressive bronchiectasis.  She underwent pulmonary function testing 11/29/2019 which I have reviewed, shows mixed restriction and obstruction with a moderately decreased FEV1, restricted volumes and a decreased diffusion capacity. COVID vaccines completed early February.   Notes reviewed from Dr Frankey Poot   Review of Systems  Respiratory: Positive for shortness of breath.   Musculoskeletal: Positive for arthralgias.      Objective:   Physical Exam Vitals:   12/13/19 1201  BP: 132/64  Pulse: 68  Temp: 97.6 F (36.4 C)  TempSrc: Temporal  SpO2: 97%  Weight: 141 lb 9.6 oz (64.2 kg)  Height: 5\' 6"  (1.676 m)   Gen: Pleasant, well-nourished, in no distress,  normal affect, moderate kyphosis  ENT: No lesions,  mouth clear,  oropharynx clear, no postnasal drip  Neck: No JVD, very soft inspiratory and expiratory stridor  Lungs: No use of accessory muscles, distant, no crackles or wheezing on normal respiration, no wheeze on forced expiration  Cardiovascular: RRR, heart sounds normal, no murmur or gallops, no peripheral edema  Musculoskeletal: No deformities, no cyanosis or clubbing  Neuro: alert, awake, non focal  Skin: Warm, no lesions or rash      Assessment & Plan:  COPD (chronic obstructive pulmonary disease) Clinically improved since she began Anoro.  Also some mild improvement in her airflows by repeat  pulmonary function testing.  Plan to continue same.  She is using albuterol rarely, will keep it available  Pulmonary Mycobacterium avium infection Based on needle biopsies.  We have deferred treatment.  Her most recent CT chest from September 2020 was stable.  Unclear that we need to do annual CTs at this point, we can discuss timing of next imaging based on her clinical status at next visit.  Baltazar Apo, MD, PhD 12/13/2019, 12:17 PM [facility] [phone] or if no answer [phone]

## 2019-12-13 NOTE — Assessment & Plan Note (Signed)
Clinically improved since she began Anoro.  Also some mild improvement in her airflows by repeat pulmonary function testing.  Plan to continue same.  She is using albuterol rarely, will keep it available

## 2020-01-03 DIAGNOSIS — J449 Chronic obstructive pulmonary disease, unspecified: Secondary | ICD-10-CM | POA: Diagnosis not present

## 2020-01-03 DIAGNOSIS — I25810 Atherosclerosis of coronary artery bypass graft(s) without angina pectoris: Secondary | ICD-10-CM | POA: Diagnosis not present

## 2020-01-03 DIAGNOSIS — I10 Essential (primary) hypertension: Secondary | ICD-10-CM | POA: Diagnosis not present

## 2020-01-03 DIAGNOSIS — E7800 Pure hypercholesterolemia, unspecified: Secondary | ICD-10-CM | POA: Diagnosis not present

## 2020-01-03 DIAGNOSIS — M19039 Primary osteoarthritis, unspecified wrist: Secondary | ICD-10-CM | POA: Diagnosis not present

## 2020-01-03 DIAGNOSIS — Z853 Personal history of malignant neoplasm of breast: Secondary | ICD-10-CM | POA: Diagnosis not present

## 2020-01-09 DIAGNOSIS — J449 Chronic obstructive pulmonary disease, unspecified: Secondary | ICD-10-CM | POA: Diagnosis not present

## 2020-01-09 DIAGNOSIS — E7800 Pure hypercholesterolemia, unspecified: Secondary | ICD-10-CM | POA: Diagnosis not present

## 2020-01-09 DIAGNOSIS — Z853 Personal history of malignant neoplasm of breast: Secondary | ICD-10-CM | POA: Diagnosis not present

## 2020-01-09 DIAGNOSIS — M19039 Primary osteoarthritis, unspecified wrist: Secondary | ICD-10-CM | POA: Diagnosis not present

## 2020-01-09 DIAGNOSIS — I10 Essential (primary) hypertension: Secondary | ICD-10-CM | POA: Diagnosis not present

## 2020-01-09 DIAGNOSIS — I25810 Atherosclerosis of coronary artery bypass graft(s) without angina pectoris: Secondary | ICD-10-CM | POA: Diagnosis not present

## 2020-01-10 DIAGNOSIS — E210 Primary hyperparathyroidism: Secondary | ICD-10-CM | POA: Diagnosis not present

## 2020-01-10 DIAGNOSIS — L659 Nonscarring hair loss, unspecified: Secondary | ICD-10-CM | POA: Diagnosis not present

## 2020-01-10 DIAGNOSIS — I10 Essential (primary) hypertension: Secondary | ICD-10-CM | POA: Diagnosis not present

## 2020-01-10 DIAGNOSIS — I25810 Atherosclerosis of coronary artery bypass graft(s) without angina pectoris: Secondary | ICD-10-CM | POA: Diagnosis not present

## 2020-01-10 DIAGNOSIS — R7301 Impaired fasting glucose: Secondary | ICD-10-CM | POA: Diagnosis not present

## 2020-02-08 DIAGNOSIS — M19039 Primary osteoarthritis, unspecified wrist: Secondary | ICD-10-CM | POA: Diagnosis not present

## 2020-02-08 DIAGNOSIS — J449 Chronic obstructive pulmonary disease, unspecified: Secondary | ICD-10-CM | POA: Diagnosis not present

## 2020-02-08 DIAGNOSIS — Z853 Personal history of malignant neoplasm of breast: Secondary | ICD-10-CM | POA: Diagnosis not present

## 2020-02-08 DIAGNOSIS — I10 Essential (primary) hypertension: Secondary | ICD-10-CM | POA: Diagnosis not present

## 2020-02-08 DIAGNOSIS — E7800 Pure hypercholesterolemia, unspecified: Secondary | ICD-10-CM | POA: Diagnosis not present

## 2020-02-08 DIAGNOSIS — I25810 Atherosclerosis of coronary artery bypass graft(s) without angina pectoris: Secondary | ICD-10-CM | POA: Diagnosis not present

## 2020-03-19 ENCOUNTER — Other Ambulatory Visit: Payer: Self-pay | Admitting: Internal Medicine

## 2020-03-26 DIAGNOSIS — I10 Essential (primary) hypertension: Secondary | ICD-10-CM | POA: Diagnosis not present

## 2020-03-26 DIAGNOSIS — Z853 Personal history of malignant neoplasm of breast: Secondary | ICD-10-CM | POA: Diagnosis not present

## 2020-03-26 DIAGNOSIS — M19039 Primary osteoarthritis, unspecified wrist: Secondary | ICD-10-CM | POA: Diagnosis not present

## 2020-03-26 DIAGNOSIS — E7800 Pure hypercholesterolemia, unspecified: Secondary | ICD-10-CM | POA: Diagnosis not present

## 2020-03-26 DIAGNOSIS — I25810 Atherosclerosis of coronary artery bypass graft(s) without angina pectoris: Secondary | ICD-10-CM | POA: Diagnosis not present

## 2020-03-26 DIAGNOSIS — J449 Chronic obstructive pulmonary disease, unspecified: Secondary | ICD-10-CM | POA: Diagnosis not present

## 2020-04-11 DIAGNOSIS — E7800 Pure hypercholesterolemia, unspecified: Secondary | ICD-10-CM | POA: Diagnosis not present

## 2020-04-11 DIAGNOSIS — I25810 Atherosclerosis of coronary artery bypass graft(s) without angina pectoris: Secondary | ICD-10-CM | POA: Diagnosis not present

## 2020-04-11 DIAGNOSIS — J449 Chronic obstructive pulmonary disease, unspecified: Secondary | ICD-10-CM | POA: Diagnosis not present

## 2020-04-11 DIAGNOSIS — M19039 Primary osteoarthritis, unspecified wrist: Secondary | ICD-10-CM | POA: Diagnosis not present

## 2020-04-11 DIAGNOSIS — I10 Essential (primary) hypertension: Secondary | ICD-10-CM | POA: Diagnosis not present

## 2020-04-11 DIAGNOSIS — Z853 Personal history of malignant neoplasm of breast: Secondary | ICD-10-CM | POA: Diagnosis not present

## 2020-04-30 DIAGNOSIS — M79644 Pain in right finger(s): Secondary | ICD-10-CM | POA: Diagnosis not present

## 2020-04-30 DIAGNOSIS — M542 Cervicalgia: Secondary | ICD-10-CM | POA: Diagnosis not present

## 2020-04-30 DIAGNOSIS — M545 Low back pain: Secondary | ICD-10-CM | POA: Diagnosis not present

## 2020-05-15 DIAGNOSIS — M542 Cervicalgia: Secondary | ICD-10-CM | POA: Diagnosis not present

## 2020-05-23 ENCOUNTER — Ambulatory Visit: Payer: PPO | Admitting: Internal Medicine

## 2020-05-23 ENCOUNTER — Encounter: Payer: Self-pay | Admitting: Internal Medicine

## 2020-05-23 ENCOUNTER — Other Ambulatory Visit: Payer: Self-pay

## 2020-05-23 VITALS — BP 126/60 | HR 65 | Ht 65.5 in | Wt 138.0 lb

## 2020-05-23 DIAGNOSIS — I2510 Atherosclerotic heart disease of native coronary artery without angina pectoris: Secondary | ICD-10-CM

## 2020-05-23 DIAGNOSIS — E785 Hyperlipidemia, unspecified: Secondary | ICD-10-CM | POA: Diagnosis not present

## 2020-05-23 DIAGNOSIS — I10 Essential (primary) hypertension: Secondary | ICD-10-CM | POA: Diagnosis not present

## 2020-05-23 NOTE — Patient Instructions (Signed)
Medication Instructions:  Your physician recommends that you continue on your current medications as directed. Please refer to the Current Medication list given to you today.  *If you need a refill on your cardiac medications before your next appointment, please call your pharmacy*  Follow-Up: At [facility], you and your health needs are our priority.  As part of our continuing mission to provide you with exceptional heart care, we have created designated Provider Care Teams.  These Care Teams include your primary Cardiologist (physician) and Advanced Practice Providers (APPs -  Physician Assistants and Nurse Practitioners) who all work together to provide you with the care you need, when you need it.  We recommend signing up for the patient portal called "MyChart".  Sign up information is provided on this After Visit Summary.  MyChart is used to connect with patients for Virtual Visits (Telemedicine).  Patients are able to view lab/test results, encounter notes, upcoming appointments, etc.  Non-urgent messages can be sent to your provider as well.   To learn more about what you can do with MyChart, go to [email or web address].    Your next appointment:   12 month(s)  The format for your next appointment:   In Person  Provider:    You may see Christopher End, MD or one of the following Advanced Practice Providers on your designated Care Team:    Christopher Berge, NP  Ryan Dunn, PA-C  Jacquelyn Visser, PA-C 

## 2020-05-23 NOTE — Progress Notes (Signed)
Follow-up Outpatient Visit Date: 05/23/2020  Primary Care Provider: Lavone Orn, MD [place] [address] [place] [address]  Chief Complaint: Follow-up coronary artery disease  HPI:  Tiffany Caldwell is a 79 y.o. female with history of coronary artery disease status post remote PCI and subsequent emergent CABGfor iatrogenicLMCA/LADdissection in 2001, hypertension, hyperlipidemia,andright lower lobe cavitary lung mass, who presents for follow-up of coronary artery disease.  I last saw her in February, at which time she reported feeling well with stable chronic exertional dyspnea.  She did not have any further episodes of chest pain.  No further testing or intervention was recommended at that time.  Today, Tiffany Caldwell reports that she feels about the same as our last visit.  She has chronic exertional dyspnea that she attributes to her COPD.  It may actually be slightly better than at prior visits, which she attributes to addition of Anoro elliptica.  She has not had any further episodes of chest pain.  She denies palpitations, lightheadedness, and edema.  Home blood pressure has been well controlled with readings usually around 120-130/60-70.  --------------------------------------------------------------------------------------------------  Cardiovascular History & Procedures: Cardiovascular Problems:  Coronary artery disease status post PCI and subsequent CABG (iatrogenic LMCA dissection)  Risk Factors:  Known coronary artery disease, hypertension, hyperlipidemia, and age  Cath/PCI:  LHC/PCI(12/16/99): LMCA normal. LAD with 80 and 90% mid vessel stenosis. LCx without significant disease. RCA with 30% proximal stenosis. Successful PCI to mid LAD with a NIR [place]3.0 x 15 mm bare-metal stent.  LHC/PCI (05/28/00): LMCA normal. LAD with 80-90% mid vessel in-stent restenosis. LCx normal. RCA with 30-40% proximal as well as ostial spasm. Plan angioplasty of previous stent  complicated by proximal LAD dissection with placement ofNIR Elite 3.5 x 12 mmbare-metal stent. Further propagation of the dissection to the [facility] referral for urgent CABG.  CV Surgery:  Urgent CABG (05/28/00): LIMA to LAD, SVG to diagonal, SVG to OM.  EP Procedures and Devices:  None  Non-Invasive Evaluation(s):  Pharmacologic MPI (08/05/2019): Low risk study without ischemia or scar. LVEF greater than 65%. Aortic atherosclerosis and coronary artery calcification noted.  TTE (11/08/14): Normal LV size with mild concentric LVH. LVEF 55-60% without regional wall motion abnormalities. Grade 1 diastolic dysfunction. Left atrium is severely dilated. RV size is normal with normal contraction. Mild TR. Otherwise no significant valvular abnormalities.  Recent CV Pertinent Labs: Lab Results  Component Value Date   CHOL 155 12/08/2017   HDL 72 12/08/2017   LDLCALC 67 12/08/2017   TRIG 79 12/08/2017   CHOLHDL 2.2 12/08/2017   INR 1.06 11/20/2016   K 3.8 08/04/2019   BUN 33 (H) 08/04/2019   CREATININE 1.06 (H) 08/04/2019    Past medical and surgical history were reviewed and updated in EPIC.  Current Meds  Medication Sig  . acetaminophen (TYLENOL) 500 MG tablet Take 1,000 mg by mouth 2 (two) times daily.   Marland Kitchen albuterol (PROAIR HFA) 108 (90 Base) MCG/ACT inhaler Inhale 1-2 puffs into the lungs every 6 (six) hours as needed for wheezing or shortness of breath.  Marland Kitchen aspirin EC 81 MG tablet Take 81 mg by mouth daily.  Marland Kitchen atorvastatin (LIPITOR) 20 MG tablet Take 1 tablet (20 mg total) by mouth daily.  . Biotin 10000 MCG TABS Take 1 tablet by mouth daily.  . Calcium Carbonate-Vitamin D (CALCIUM 600 + D PO) Take 1 tablet by mouth daily.   . Carboxymethylcell-Hypromellose (GENTEAL OP) Place 1 drop into both eyes at bedtime.  . fexofenadine (  ALLEGRA) 180 MG tablet Take 180 mg by mouth daily.  . fish oil-omega-3 fatty acids 1000 MG capsule Take 1 g by mouth daily.   Marland Kitchen  gabapentin (NEURONTIN) 300 MG capsule Take 300 mg by mouth 4 (four) times daily.   . Glucosamine-Chondroit-Vit C-Mn (GLUCOSAMINE CHONDR 1500 COMPLX PO) Take 1 tablet by mouth 2 (two) times daily.  . hyoscyamine (LEVBID) 0.375 MG 12 hr tablet Take 0.375 mg by mouth every 12 (twelve) hours as needed for cramping (IBS).   . Multiple Vitamin (MULTIVITAMIN) tablet Take 1 tablet by mouth daily.    . nitroGLYCERIN (NITROSTAT) 0.4 MG SL tablet Place 0.4 mg under the tongue every 5 (five) minutes as needed for chest pain.  Marland Kitchen umeclidinium-vilanterol (ANORO ELLIPTA) 62.5-25 MCG/INH AEPB TAKE 1 PUFF BY MOUTH EVERY DAY  . valsartan-hydrochlorothiazide (DIOVAN-HCT) 80-12.5 MG tablet Take 1 tablet by mouth daily.    Allergies: Hydromorphone, Azithromycin, Ciprofloxacin, Hydromorphone hcl, Levofloxacin, and Lisinopril  Social History   Tobacco Use  . Smoking status: Former Smoker    Years: 30.00    Types: Cigarettes    Quit date: 07/31/1989    Years since quitting: 30.8  . Smokeless tobacco: Never Used  Vaping Use  . Vaping Use: Never used  Substance Use Topics  . Alcohol use: Yes    Alcohol/week: 1.0 standard drink    Types: 1 Glasses of wine per week    Comment: occasional wine  . Drug use: No    Family History  Problem Relation Age of Onset  . Emphysema Father        deceased  . Heart disease Mother        deceased  . Colon cancer Paternal Grandmother   . Colon cancer Son   . Colon cancer Paternal Uncle     Review of Systems: A 12-system review of systems was performed and was negative except as noted in the HPI.  --------------------------------------------------------------------------------------------------  Physical Exam: BP 126/60 (BP Location: Left Arm, Patient Position: Sitting, Cuff Size: Normal)   Pulse 65   Ht 5' 5.5" (1.664 m)   Wt 138 lb (62.6 kg)   LMP  (LMP Unknown)   BMI 22.62 kg/m   General: NAD. Neck: No JVD or HJR. Lungs: Mildly diminished breath sounds  throughout without wheezes or crackles. Heart: Regular rate and rhythm without murmurs, rubs, gallops. Abdomen: Soft, nontender, nondistended. Extremities: No lower extremity edema.  EKG: Normal sinus rhythm with LVH and abnormal repolarization.  No significant change from prior tracing on 11/23/2019.  Lab Results  Component Value Date   WBC 4.0 08/04/2019   HGB 12.2 08/04/2019   HCT 37.8 08/04/2019   MCV 103.0 (H) 08/04/2019   PLT 137 (L) 08/04/2019    Lab Results  Component Value Date   NA 142 08/04/2019   K 3.8 08/04/2019   CL 103 08/04/2019   CO2 30 08/04/2019   BUN 33 (H) 08/04/2019   CREATININE 1.06 (H) 08/04/2019   GLUCOSE 103 (H) 08/04/2019   ALT 25 08/04/2019    Lab Results  Component Value Date   CHOL 155 12/08/2017   HDL 72 12/08/2017   LDLCALC 67 12/08/2017   TRIG 79 12/08/2017   CHOLHDL 2.2 12/08/2017    --------------------------------------------------------------------------------------------------  ASSESSMENT AND PLAN: Coronary artery disease without angina: Tiffany Caldwell continues to do well without any recurrent chest pain.  Her chronic exertional dyspnea is most likely driven by underlying lung disease.  Continue current medications for secondary prevention.  Hypertension: Blood pressure  well controlled.  No medication changes today.  Hyperlipidemia: Continue atorvastatin 20 mg daily.  Ongoing lipid monitoring per Dr. Laurann Montana, with target LDL less than 70.  Follow-up: Return to clinic in 1 year.  [name], MD 05/23/2020 2:53 PM

## 2020-05-24 DIAGNOSIS — M542 Cervicalgia: Secondary | ICD-10-CM | POA: Diagnosis not present

## 2020-05-30 DIAGNOSIS — M542 Cervicalgia: Secondary | ICD-10-CM | POA: Diagnosis not present

## 2020-06-05 ENCOUNTER — Other Ambulatory Visit: Payer: Self-pay | Admitting: Internal Medicine

## 2020-06-05 DIAGNOSIS — I25810 Atherosclerosis of coronary artery bypass graft(s) without angina pectoris: Secondary | ICD-10-CM | POA: Diagnosis not present

## 2020-06-05 DIAGNOSIS — Z1231 Encounter for screening mammogram for malignant neoplasm of breast: Secondary | ICD-10-CM | POA: Diagnosis not present

## 2020-06-05 DIAGNOSIS — I10 Essential (primary) hypertension: Secondary | ICD-10-CM | POA: Diagnosis not present

## 2020-06-05 DIAGNOSIS — M19039 Primary osteoarthritis, unspecified wrist: Secondary | ICD-10-CM | POA: Diagnosis not present

## 2020-06-05 DIAGNOSIS — J449 Chronic obstructive pulmonary disease, unspecified: Secondary | ICD-10-CM | POA: Diagnosis not present

## 2020-06-05 DIAGNOSIS — E7800 Pure hypercholesterolemia, unspecified: Secondary | ICD-10-CM | POA: Diagnosis not present

## 2020-06-05 DIAGNOSIS — Z853 Personal history of malignant neoplasm of breast: Secondary | ICD-10-CM | POA: Diagnosis not present

## 2020-06-05 MED ORDER — ATORVASTATIN CALCIUM 20 MG PO TABS: 20.0000 mg | ORAL_TABLET | Freq: Every day | ORAL | 1 refills | Status: DC

## 2020-06-05 NOTE — Telephone Encounter (Signed)
° ° ° °*  STAT* If patient is at the pharmacy, call can be transferred to refill team.   1. Which medications need to be refilled? (please list name of each medication and dose if known)   atorvastatin (LIPITOR) 20 MG tablet     2. Which pharmacy/location (including street and city if local pharmacy) is medication to be sent to?[facility] - [place], [place] - [address]  3. Do they need a 30 day or 90 day supply? 90 days

## 2020-06-05 NOTE — Telephone Encounter (Signed)
Refill sent to pharmacy.   

## 2020-06-06 DIAGNOSIS — M542 Cervicalgia: Secondary | ICD-10-CM | POA: Diagnosis not present

## 2020-06-14 DIAGNOSIS — M542 Cervicalgia: Secondary | ICD-10-CM | POA: Diagnosis not present

## 2020-06-15 DIAGNOSIS — M545 Low back pain: Secondary | ICD-10-CM | POA: Diagnosis not present

## 2020-06-27 DIAGNOSIS — M542 Cervicalgia: Secondary | ICD-10-CM | POA: Diagnosis not present

## 2020-07-02 DIAGNOSIS — J449 Chronic obstructive pulmonary disease, unspecified: Secondary | ICD-10-CM | POA: Diagnosis not present

## 2020-07-02 DIAGNOSIS — I25810 Atherosclerosis of coronary artery bypass graft(s) without angina pectoris: Secondary | ICD-10-CM | POA: Diagnosis not present

## 2020-07-02 DIAGNOSIS — M19039 Primary osteoarthritis, unspecified wrist: Secondary | ICD-10-CM | POA: Diagnosis not present

## 2020-07-02 DIAGNOSIS — Z853 Personal history of malignant neoplasm of breast: Secondary | ICD-10-CM | POA: Diagnosis not present

## 2020-07-02 DIAGNOSIS — E7800 Pure hypercholesterolemia, unspecified: Secondary | ICD-10-CM | POA: Diagnosis not present

## 2020-07-02 DIAGNOSIS — I10 Essential (primary) hypertension: Secondary | ICD-10-CM | POA: Diagnosis not present

## 2020-07-11 ENCOUNTER — Telehealth: Payer: Self-pay | Admitting: Emergency Medicine

## 2020-07-11 ENCOUNTER — Other Ambulatory Visit: Payer: Self-pay | Admitting: Emergency Medicine

## 2020-07-11 DIAGNOSIS — M542 Cervicalgia: Secondary | ICD-10-CM | POA: Diagnosis not present

## 2020-07-11 MED ORDER — ANORO ELLIPTA 62.5-25 MCG/INH IN AEPB: INHALATION_SPRAY | RESPIRATORY_TRACT | 6 refills | Status: DC

## 2020-07-11 NOTE — Telephone Encounter (Signed)
Called and spoke with Roselyn Reef with [facility], advised the script is being sent at this time.  Nothing further needed.

## 2020-07-16 DIAGNOSIS — M25512 Pain in left shoulder: Secondary | ICD-10-CM | POA: Diagnosis not present

## 2020-07-17 DIAGNOSIS — Z0000 Encounter for general adult medical examination without abnormal findings: Secondary | ICD-10-CM | POA: Diagnosis not present

## 2020-07-17 DIAGNOSIS — R7301 Impaired fasting glucose: Secondary | ICD-10-CM | POA: Diagnosis not present

## 2020-07-17 DIAGNOSIS — Z23 Encounter for immunization: Secondary | ICD-10-CM | POA: Diagnosis not present

## 2020-07-17 DIAGNOSIS — I700 Atherosclerosis of aorta: Secondary | ICD-10-CM | POA: Diagnosis not present

## 2020-07-17 DIAGNOSIS — Z1389 Encounter for screening for other disorder: Secondary | ICD-10-CM | POA: Diagnosis not present

## 2020-07-17 DIAGNOSIS — I25810 Atherosclerosis of coronary artery bypass graft(s) without angina pectoris: Secondary | ICD-10-CM | POA: Diagnosis not present

## 2020-07-17 DIAGNOSIS — G250 Essential tremor: Secondary | ICD-10-CM | POA: Diagnosis not present

## 2020-07-17 DIAGNOSIS — I10 Essential (primary) hypertension: Secondary | ICD-10-CM | POA: Diagnosis not present

## 2020-07-17 DIAGNOSIS — Z7189 Other specified counseling: Secondary | ICD-10-CM | POA: Diagnosis not present

## 2020-07-17 DIAGNOSIS — E210 Primary hyperparathyroidism: Secondary | ICD-10-CM | POA: Diagnosis not present

## 2020-07-17 DIAGNOSIS — J449 Chronic obstructive pulmonary disease, unspecified: Secondary | ICD-10-CM | POA: Diagnosis not present

## 2020-07-17 DIAGNOSIS — E7800 Pure hypercholesterolemia, unspecified: Secondary | ICD-10-CM | POA: Diagnosis not present

## 2020-07-23 ENCOUNTER — Other Ambulatory Visit: Payer: Self-pay

## 2020-07-23 ENCOUNTER — Encounter: Payer: Self-pay | Admitting: Emergency Medicine

## 2020-07-23 ENCOUNTER — Ambulatory Visit: Payer: PPO | Admitting: Emergency Medicine

## 2020-07-23 VITALS — BP 142/76 | HR 71 | Temp 98.2°F | Ht 66.0 in | Wt 140.0 lb

## 2020-07-23 DIAGNOSIS — A310 Pulmonary mycobacterial infection: Secondary | ICD-10-CM

## 2020-07-23 DIAGNOSIS — R918 Other nonspecific abnormal finding of lung field: Secondary | ICD-10-CM | POA: Diagnosis not present

## 2020-07-23 DIAGNOSIS — J449 Chronic obstructive pulmonary disease, unspecified: Secondary | ICD-10-CM

## 2020-07-23 NOTE — Progress Notes (Signed)
   Subjective:    Patient ID: Izora Gala, female    DOB: 11-10-1940, 79 y.o.   MRN: [id number]  HPI  ROV 12/13/2019 --79 year old woman with moderately severe COPD.  Also history of CAD/CABG, breast cancer (surgery), pulmonary nodular disease that proved to be Mycobacterium avium, treatment was deferred.  Since I last saw her she has been tried on initially [place] and then changed to [employer]. Her most recent CT chest was 06/22/2019 reviewed by me, showed stable right lower lobe mass lesion 3.5 cm and scattered pulmonary nodules that are all smaller.  No progressive bronchiectasis.  She underwent pulmonary function testing 11/29/2019 which I have reviewed, shows mixed restriction and obstruction with a moderately decreased FEV1, restricted volumes and a decreased diffusion capacity. COVID vaccines completed early February.   ROV 07/23/20 --follow-up visit for 79 year old woman with COPD, history of breast cancer, CAD/CABG, micronodular disease that was proven to be Mycobacterium avium (untreated).  We have been managing her on Anoro.  She has albuterol which she uses very rarely. She is doing quite well, is able to to exert. No cough. She is about to move to assisted living   Review of Systems  Respiratory: Positive for shortness of breath.   Musculoskeletal: Positive for arthralgias.      Objective:   Physical Exam Vitals:   07/23/20 1634  BP: (!) 142/76  Pulse: 71  Temp: 98.2 F (36.8 C)  TempSrc: Temporal  SpO2: 94%  Weight: 140 lb (63.5 kg)  Height: 5\' 6"  (1.676 m)   Gen: Pleasant, well-nourished, in no distress,  normal affect, moderate kyphosis  ENT: No lesions,  mouth clear,  oropharynx clear, no postnasal drip  Neck: No JVD, very soft inspiratory and expiratory stridor  Lungs: No use of accessory muscles, distant, no crackles or wheezing on normal respiration, no wheeze on forced expiration  Cardiovascular: RRR, heart sounds normal, no murmur or gallops, no peripheral  edema  Musculoskeletal: No deformities, no cyanosis or clubbing  Neuro: alert, awake, non focal  Skin: Warm, no lesions or rash      Assessment & Plan:  COPD (chronic obstructive pulmonary disease) Significant disease, but actually doing quite well.  She is on Anoro and is tolerating well.  No exacerbations.  No functional limitation.  Very rare albuterol use.  Plan to continue same regimen  Pulmonary Mycobacterium avium infection She had multiple body mass lesion.  All cytologies were negative but she did grow out Mycobacterium avium.  This was never treated as she has an azithromycin allergy.  I will repeat her chest now to follow for interval stability.  Based on results we may decide to consider repeat biopsy, consider treatment of MAIC.  Baltazar Apo, MD, PhD 07/23/2020, 5:11 PM [facility] [phone] or if no answer [phone]

## 2020-07-23 NOTE — Patient Instructions (Signed)
We will repeat your CT scan of the chest to compare with your priors. Continue your Anoro 1 inhalation once daily. Keep your albuterol available to use 2 puffs if you need it for shortness of breath Follow Dr. Lamonte Sakai after your CT so that we can review the results together.

## 2020-07-23 NOTE — Assessment & Plan Note (Signed)
Significant disease, but actually doing quite well.  She is on Anoro and is tolerating well.  No exacerbations.  No functional limitation.  Very rare albuterol use.  Plan to continue same regimen

## 2020-07-23 NOTE — Assessment & Plan Note (Signed)
She had multiple body mass lesion.  All cytologies were negative but she did grow out Mycobacterium avium.  This was never treated as she has an azithromycin allergy.  I will repeat her chest now to follow for interval stability.  Based on results we may decide to consider repeat biopsy, consider treatment of MAIC.

## 2020-07-24 DIAGNOSIS — H524 Presbyopia: Secondary | ICD-10-CM | POA: Diagnosis not present

## 2020-07-24 DIAGNOSIS — Z961 Presence of intraocular lens: Secondary | ICD-10-CM | POA: Diagnosis not present

## 2020-07-26 ENCOUNTER — Ambulatory Visit (INDEPENDENT_AMBULATORY_CARE_PROVIDER_SITE_OTHER)
Admission: RE | Admit: 2020-07-26 | Discharge: 2020-07-26 | Disposition: A | Discharge status: Home / Self Care | Payer: PPO | Source: Ambulatory Visit | Attending: Emergency Medicine | Admitting: Emergency Medicine

## 2020-07-26 ENCOUNTER — Other Ambulatory Visit: Payer: Self-pay

## 2020-07-26 DIAGNOSIS — I700 Atherosclerosis of aorta: Secondary | ICD-10-CM | POA: Diagnosis not present

## 2020-07-26 DIAGNOSIS — J432 Centrilobular emphysema: Secondary | ICD-10-CM | POA: Diagnosis not present

## 2020-07-26 DIAGNOSIS — R918 Other nonspecific abnormal finding of lung field: Secondary | ICD-10-CM | POA: Diagnosis not present

## 2020-07-26 DIAGNOSIS — J984 Other disorders of lung: Secondary | ICD-10-CM | POA: Diagnosis not present

## 2020-07-31 DIAGNOSIS — I25810 Atherosclerosis of coronary artery bypass graft(s) without angina pectoris: Secondary | ICD-10-CM | POA: Diagnosis not present

## 2020-07-31 DIAGNOSIS — Z853 Personal history of malignant neoplasm of breast: Secondary | ICD-10-CM | POA: Diagnosis not present

## 2020-07-31 DIAGNOSIS — M19039 Primary osteoarthritis, unspecified wrist: Secondary | ICD-10-CM | POA: Diagnosis not present

## 2020-07-31 DIAGNOSIS — I10 Essential (primary) hypertension: Secondary | ICD-10-CM | POA: Diagnosis not present

## 2020-07-31 DIAGNOSIS — E7800 Pure hypercholesterolemia, unspecified: Secondary | ICD-10-CM | POA: Diagnosis not present

## 2020-07-31 DIAGNOSIS — J449 Chronic obstructive pulmonary disease, unspecified: Secondary | ICD-10-CM | POA: Diagnosis not present

## 2020-08-08 DIAGNOSIS — M542 Cervicalgia: Secondary | ICD-10-CM | POA: Diagnosis not present

## 2020-08-15 DIAGNOSIS — M47816 Spondylosis without myelopathy or radiculopathy, lumbar region: Secondary | ICD-10-CM | POA: Diagnosis not present

## 2020-08-27 DIAGNOSIS — M542 Cervicalgia: Secondary | ICD-10-CM | POA: Diagnosis not present

## 2020-08-28 ENCOUNTER — Other Ambulatory Visit: Payer: Self-pay

## 2020-08-28 ENCOUNTER — Encounter: Payer: Self-pay | Admitting: Emergency Medicine

## 2020-08-28 ENCOUNTER — Ambulatory Visit: Payer: PPO | Admitting: Emergency Medicine

## 2020-08-28 VITALS — BP 136/72 | HR 72 | Temp 98.2°F | Ht 65.0 in | Wt 139.4 lb

## 2020-08-28 DIAGNOSIS — R911 Solitary pulmonary nodule: Secondary | ICD-10-CM

## 2020-08-28 DIAGNOSIS — J984 Other disorders of lung: Secondary | ICD-10-CM | POA: Diagnosis not present

## 2020-08-28 DIAGNOSIS — A310 Pulmonary mycobacterial infection: Secondary | ICD-10-CM | POA: Diagnosis not present

## 2020-08-28 DIAGNOSIS — J449 Chronic obstructive pulmonary disease, unspecified: Secondary | ICD-10-CM | POA: Diagnosis not present

## 2020-08-28 NOTE — Assessment & Plan Note (Signed)
Continue your Anoro 1 inhalation once daily. Keep albuterol available in case you need it for shortness of breath, chest tightness, wheezing. Otherwise follow-up in 6 months

## 2020-08-28 NOTE — Addendum Note (Signed)
Addended by: Gavin Potters R on: 08/28/2020 03:55 PM   Modules accepted: Orders

## 2020-08-28 NOTE — Progress Notes (Signed)
Subjective:    Patient ID: Tiffany Caldwell, female    DOB: 11/15/1940, 79 y.o.   MRN: [id number]  HPI  ROV 12/13/2019 --79 year old woman with moderately severe COPD.  Also history of CAD/CABG, breast cancer (surgery), pulmonary nodular disease that proved to be Mycobacterium avium, treatment was deferred.  Since I last saw her she has been tried on initially [place] and then changed to [employer]. Her most recent CT chest was 06/22/2019 reviewed by me, showed stable right lower lobe mass lesion 3.5 cm and scattered pulmonary nodules that are all smaller.  No progressive bronchiectasis.  She underwent pulmonary function testing 11/29/2019 which I have reviewed, shows mixed restriction and obstruction with a moderately decreased FEV1, restricted volumes and a decreased diffusion capacity. COVID vaccines completed early February.   ROV 07/23/20 --follow-up visit for 79 year old woman with COPD, history of breast cancer, CAD/CABG, micronodular disease that was proven to be Mycobacterium avium (untreated).  We have been managing her on Anoro.  She has albuterol which she uses very rarely. She is doing quite well, is able to to exert. No cough. She is about to move to assisted living  [place] 08/28/20 --this is a follow-up visit for Tiffany Caldwell.  She is 79 with moderate severe COPD, also history of CAD/CABG, breast cancer, Mycobacterium avium.  She was not treated for this.  She has an abnormal CT scan of the chest with a right lower lobe masslike lesion 3.5 cm and scattered pulmonary nodular disease, bronchiectasis.  We repeated her CT 07/27/2020 which I have reviewed, shows that the right lower lobe mass lesion is stable to mildly decreased in size with a new focal area of cavitary change in its medial aspect.  There is a stable 6 mm right lower lobe nodule   Review of Systems  Respiratory: Positive for shortness of breath.   Musculoskeletal: Positive for arthralgias.      Objective:   Physical  Exam Vitals:   08/28/20 1501  BP: 136/72  Pulse: 72  Temp: 98.2 F (36.8 C)  SpO2: 97%  Weight: 139 lb 6.4 oz (63.2 kg)  Height: _0  (1.651 m)   Gen: Pleasant, well-nourished, in no distress,  normal affect, moderate kyphosis  ENT: No lesions,  mouth clear,  oropharynx clear, no postnasal drip  Neck: No JVD, very soft inspiratory and expiratory stridor  Lungs: No use of accessory muscles, distant, no crackles or wheezing on normal respiration, no wheeze on forced expiration  Cardiovascular: RRR, heart sounds normal, no murmur or gallops, no peripheral edema  Musculoskeletal: No deformities, no cyanosis or clubbing  Neuro: alert, awake, non focal  Skin: Warm, no lesions or rash      Assessment & Plan:  COPD (chronic obstructive pulmonary disease) Continue your Anoro 1 inhalation once daily. Keep albuterol available in case you need it for shortness of breath, chest tightness, wheezing. Otherwise follow-up in 6 months  Pulmonary Mycobacterium avium infection Irregularly shaped masslike scar at her right base.  Biopsied by bronchoscopy and negative in 2018.  She did grow out AFB, Mycobacterium avium.  Never treated.  There is now a cavitary area in the opacity but it is no bigger.  Question whether this may be smoldering MAIC.  She is asymptomatic and I do not think we need to consider treatment based on this change.  If she has increased coughing, increased symptoms then we would repeat her CT sooner, probably obtain sputum or BAL for repeat cultures and sensitivities.  Baltazar Apo,  MD, PhD 08/28/2020, 3:33 PM [facility] [phone] or if no answer [phone]

## 2020-08-28 NOTE — Patient Instructions (Signed)
Your CT scan of your chest from October 2021 is overall stable.  There is a small amount of cavitary change in your known right lower lobe opacity/scar. We will plan to repeat your CT scan of the chest in 07/2021. Continue your Anoro 1 inhalation once daily. Keep albuterol available in case you need it for shortness of breath, chest tightness, wheezing. Please let us know if your breathing, cough change in any way.  If so then we will decide to see you sooner and repeat your CT scan of the chest sooner. Otherwise follow-up in 6 months

## 2020-08-28 NOTE — Assessment & Plan Note (Signed)
Irregularly shaped masslike scar at her right base.  Biopsied by bronchoscopy and negative in 2018.  She did grow out AFB, Mycobacterium avium.  Never treated.  There is now a cavitary area in the opacity but it is no bigger.  Question whether this may be smoldering MAIC.  She is asymptomatic and I do not think we need to consider treatment based on this change.  If she has increased coughing, increased symptoms then we would repeat her CT sooner, probably obtain sputum or BAL for repeat cultures and sensitivities.

## 2020-09-03 DIAGNOSIS — E7800 Pure hypercholesterolemia, unspecified: Secondary | ICD-10-CM | POA: Diagnosis not present

## 2020-09-03 DIAGNOSIS — I10 Essential (primary) hypertension: Secondary | ICD-10-CM | POA: Diagnosis not present

## 2020-09-03 DIAGNOSIS — M19039 Primary osteoarthritis, unspecified wrist: Secondary | ICD-10-CM | POA: Diagnosis not present

## 2020-09-03 DIAGNOSIS — M8580 Other specified disorders of bone density and structure, unspecified site: Secondary | ICD-10-CM | POA: Diagnosis not present

## 2020-09-03 DIAGNOSIS — J449 Chronic obstructive pulmonary disease, unspecified: Secondary | ICD-10-CM | POA: Diagnosis not present

## 2020-09-03 DIAGNOSIS — I25810 Atherosclerosis of coronary artery bypass graft(s) without angina pectoris: Secondary | ICD-10-CM | POA: Diagnosis not present

## 2020-09-03 DIAGNOSIS — Z853 Personal history of malignant neoplasm of breast: Secondary | ICD-10-CM | POA: Diagnosis not present

## 2020-09-03 DIAGNOSIS — K219 Gastro-esophageal reflux disease without esophagitis: Secondary | ICD-10-CM | POA: Diagnosis not present

## 2020-09-05 DIAGNOSIS — M542 Cervicalgia: Secondary | ICD-10-CM | POA: Diagnosis not present

## 2020-09-19 DIAGNOSIS — M47816 Spondylosis without myelopathy or radiculopathy, lumbar region: Secondary | ICD-10-CM | POA: Diagnosis not present

## 2020-09-21 DIAGNOSIS — H5711 Ocular pain, right eye: Secondary | ICD-10-CM | POA: Diagnosis not present

## 2020-10-02 DIAGNOSIS — Z853 Personal history of malignant neoplasm of breast: Secondary | ICD-10-CM | POA: Diagnosis not present

## 2020-10-02 DIAGNOSIS — I25810 Atherosclerosis of coronary artery bypass graft(s) without angina pectoris: Secondary | ICD-10-CM | POA: Diagnosis not present

## 2020-10-02 DIAGNOSIS — I10 Essential (primary) hypertension: Secondary | ICD-10-CM | POA: Diagnosis not present

## 2020-10-02 DIAGNOSIS — M8580 Other specified disorders of bone density and structure, unspecified site: Secondary | ICD-10-CM | POA: Diagnosis not present

## 2020-10-02 DIAGNOSIS — J449 Chronic obstructive pulmonary disease, unspecified: Secondary | ICD-10-CM | POA: Diagnosis not present

## 2020-10-02 DIAGNOSIS — E7800 Pure hypercholesterolemia, unspecified: Secondary | ICD-10-CM | POA: Diagnosis not present

## 2020-10-02 DIAGNOSIS — K219 Gastro-esophageal reflux disease without esophagitis: Secondary | ICD-10-CM | POA: Diagnosis not present

## 2020-10-02 DIAGNOSIS — M19039 Primary osteoarthritis, unspecified wrist: Secondary | ICD-10-CM | POA: Diagnosis not present

## 2020-10-03 DIAGNOSIS — H0100A Unspecified blepharitis right eye, upper and lower eyelids: Secondary | ICD-10-CM | POA: Diagnosis not present

## 2020-10-03 DIAGNOSIS — Z961 Presence of intraocular lens: Secondary | ICD-10-CM | POA: Diagnosis not present

## 2020-10-03 DIAGNOSIS — H0100B Unspecified blepharitis left eye, upper and lower eyelids: Secondary | ICD-10-CM | POA: Diagnosis not present

## 2020-10-03 DIAGNOSIS — H5711 Ocular pain, right eye: Secondary | ICD-10-CM | POA: Diagnosis not present

## 2020-10-05 ENCOUNTER — Other Ambulatory Visit: Payer: Self-pay | Admitting: Internal Medicine

## 2020-11-02 DIAGNOSIS — Z853 Personal history of malignant neoplasm of breast: Secondary | ICD-10-CM | POA: Diagnosis not present

## 2020-11-02 DIAGNOSIS — M19041 Primary osteoarthritis, right hand: Secondary | ICD-10-CM | POA: Diagnosis not present

## 2020-11-02 DIAGNOSIS — K219 Gastro-esophageal reflux disease without esophagitis: Secondary | ICD-10-CM | POA: Diagnosis not present

## 2020-11-02 DIAGNOSIS — M19039 Primary osteoarthritis, unspecified wrist: Secondary | ICD-10-CM | POA: Diagnosis not present

## 2020-11-02 DIAGNOSIS — I10 Essential (primary) hypertension: Secondary | ICD-10-CM | POA: Diagnosis not present

## 2020-11-02 DIAGNOSIS — M8580 Other specified disorders of bone density and structure, unspecified site: Secondary | ICD-10-CM | POA: Diagnosis not present

## 2020-11-02 DIAGNOSIS — E7800 Pure hypercholesterolemia, unspecified: Secondary | ICD-10-CM | POA: Diagnosis not present

## 2020-11-02 DIAGNOSIS — J449 Chronic obstructive pulmonary disease, unspecified: Secondary | ICD-10-CM | POA: Diagnosis not present

## 2020-11-02 DIAGNOSIS — I25810 Atherosclerosis of coronary artery bypass graft(s) without angina pectoris: Secondary | ICD-10-CM | POA: Diagnosis not present

## 2020-11-16 DIAGNOSIS — M1812 Unilateral primary osteoarthritis of first carpometacarpal joint, left hand: Secondary | ICD-10-CM | POA: Diagnosis not present

## 2020-12-03 DIAGNOSIS — Z853 Personal history of malignant neoplasm of breast: Secondary | ICD-10-CM | POA: Diagnosis not present

## 2020-12-03 DIAGNOSIS — M8580 Other specified disorders of bone density and structure, unspecified site: Secondary | ICD-10-CM | POA: Diagnosis not present

## 2020-12-03 DIAGNOSIS — M19039 Primary osteoarthritis, unspecified wrist: Secondary | ICD-10-CM | POA: Diagnosis not present

## 2020-12-03 DIAGNOSIS — I25810 Atherosclerosis of coronary artery bypass graft(s) without angina pectoris: Secondary | ICD-10-CM | POA: Diagnosis not present

## 2020-12-03 DIAGNOSIS — J449 Chronic obstructive pulmonary disease, unspecified: Secondary | ICD-10-CM | POA: Diagnosis not present

## 2020-12-03 DIAGNOSIS — K219 Gastro-esophageal reflux disease without esophagitis: Secondary | ICD-10-CM | POA: Diagnosis not present

## 2020-12-03 DIAGNOSIS — I10 Essential (primary) hypertension: Secondary | ICD-10-CM | POA: Diagnosis not present

## 2020-12-03 DIAGNOSIS — E7800 Pure hypercholesterolemia, unspecified: Secondary | ICD-10-CM | POA: Diagnosis not present

## 2020-12-07 DIAGNOSIS — H5711 Ocular pain, right eye: Secondary | ICD-10-CM | POA: Diagnosis not present

## 2020-12-07 DIAGNOSIS — H16101 Unspecified superficial keratitis, right eye: Secondary | ICD-10-CM | POA: Diagnosis not present

## 2020-12-14 DIAGNOSIS — H5711 Ocular pain, right eye: Secondary | ICD-10-CM | POA: Diagnosis not present

## 2020-12-28 DIAGNOSIS — M4316 Spondylolisthesis, lumbar region: Secondary | ICD-10-CM | POA: Diagnosis not present

## 2020-12-31 DIAGNOSIS — M79662 Pain in left lower leg: Secondary | ICD-10-CM | POA: Diagnosis not present

## 2021-01-03 DIAGNOSIS — E7800 Pure hypercholesterolemia, unspecified: Secondary | ICD-10-CM | POA: Diagnosis not present

## 2021-01-03 DIAGNOSIS — Z853 Personal history of malignant neoplasm of breast: Secondary | ICD-10-CM | POA: Diagnosis not present

## 2021-01-03 DIAGNOSIS — I25810 Atherosclerosis of coronary artery bypass graft(s) without angina pectoris: Secondary | ICD-10-CM | POA: Diagnosis not present

## 2021-01-03 DIAGNOSIS — I10 Essential (primary) hypertension: Secondary | ICD-10-CM | POA: Diagnosis not present

## 2021-01-03 DIAGNOSIS — K219 Gastro-esophageal reflux disease without esophagitis: Secondary | ICD-10-CM | POA: Diagnosis not present

## 2021-01-03 DIAGNOSIS — M19039 Primary osteoarthritis, unspecified wrist: Secondary | ICD-10-CM | POA: Diagnosis not present

## 2021-01-03 DIAGNOSIS — M8580 Other specified disorders of bone density and structure, unspecified site: Secondary | ICD-10-CM | POA: Diagnosis not present

## 2021-01-03 DIAGNOSIS — J449 Chronic obstructive pulmonary disease, unspecified: Secondary | ICD-10-CM | POA: Diagnosis not present

## 2021-01-11 DIAGNOSIS — M5136 Other intervertebral disc degeneration, lumbar region: Secondary | ICD-10-CM | POA: Diagnosis not present

## 2021-01-11 DIAGNOSIS — I10 Essential (primary) hypertension: Secondary | ICD-10-CM | POA: Diagnosis not present

## 2021-01-11 DIAGNOSIS — I700 Atherosclerosis of aorta: Secondary | ICD-10-CM | POA: Diagnosis not present

## 2021-01-11 DIAGNOSIS — E210 Primary hyperparathyroidism: Secondary | ICD-10-CM | POA: Diagnosis not present

## 2021-01-11 DIAGNOSIS — G250 Essential tremor: Secondary | ICD-10-CM | POA: Diagnosis not present

## 2021-01-11 DIAGNOSIS — J449 Chronic obstructive pulmonary disease, unspecified: Secondary | ICD-10-CM | POA: Diagnosis not present

## 2021-01-15 DIAGNOSIS — K219 Gastro-esophageal reflux disease without esophagitis: Secondary | ICD-10-CM | POA: Diagnosis not present

## 2021-01-15 DIAGNOSIS — I25810 Atherosclerosis of coronary artery bypass graft(s) without angina pectoris: Secondary | ICD-10-CM | POA: Diagnosis not present

## 2021-01-15 DIAGNOSIS — M8580 Other specified disorders of bone density and structure, unspecified site: Secondary | ICD-10-CM | POA: Diagnosis not present

## 2021-01-15 DIAGNOSIS — E7800 Pure hypercholesterolemia, unspecified: Secondary | ICD-10-CM | POA: Diagnosis not present

## 2021-01-15 DIAGNOSIS — Z853 Personal history of malignant neoplasm of breast: Secondary | ICD-10-CM | POA: Diagnosis not present

## 2021-01-15 DIAGNOSIS — J449 Chronic obstructive pulmonary disease, unspecified: Secondary | ICD-10-CM | POA: Diagnosis not present

## 2021-01-15 DIAGNOSIS — M19039 Primary osteoarthritis, unspecified wrist: Secondary | ICD-10-CM | POA: Diagnosis not present

## 2021-01-15 DIAGNOSIS — I10 Essential (primary) hypertension: Secondary | ICD-10-CM | POA: Diagnosis not present

## 2021-01-24 ENCOUNTER — Other Ambulatory Visit: Payer: Self-pay | Admitting: Emergency Medicine

## 2021-01-24 NOTE — Telephone Encounter (Signed)
Pt will need appointment for further refills.

## 2021-02-05 DIAGNOSIS — K219 Gastro-esophageal reflux disease without esophagitis: Secondary | ICD-10-CM | POA: Diagnosis not present

## 2021-02-05 DIAGNOSIS — M8580 Other specified disorders of bone density and structure, unspecified site: Secondary | ICD-10-CM | POA: Diagnosis not present

## 2021-02-05 DIAGNOSIS — I10 Essential (primary) hypertension: Secondary | ICD-10-CM | POA: Diagnosis not present

## 2021-02-05 DIAGNOSIS — J449 Chronic obstructive pulmonary disease, unspecified: Secondary | ICD-10-CM | POA: Diagnosis not present

## 2021-02-05 DIAGNOSIS — I25810 Atherosclerosis of coronary artery bypass graft(s) without angina pectoris: Secondary | ICD-10-CM | POA: Diagnosis not present

## 2021-02-05 DIAGNOSIS — M19039 Primary osteoarthritis, unspecified wrist: Secondary | ICD-10-CM | POA: Diagnosis not present

## 2021-02-05 DIAGNOSIS — E7800 Pure hypercholesterolemia, unspecified: Secondary | ICD-10-CM | POA: Diagnosis not present

## 2021-02-11 DIAGNOSIS — M79645 Pain in left finger(s): Secondary | ICD-10-CM | POA: Diagnosis not present

## 2021-02-11 DIAGNOSIS — M545 Low back pain, unspecified: Secondary | ICD-10-CM | POA: Diagnosis not present

## 2021-02-19 DIAGNOSIS — M545 Low back pain, unspecified: Secondary | ICD-10-CM | POA: Diagnosis not present

## 2021-02-26 DIAGNOSIS — M79644 Pain in right finger(s): Secondary | ICD-10-CM | POA: Diagnosis not present

## 2021-03-11 DIAGNOSIS — M5416 Radiculopathy, lumbar region: Secondary | ICD-10-CM | POA: Diagnosis not present

## 2021-03-16 ENCOUNTER — Other Ambulatory Visit: Payer: Self-pay | Admitting: Internal Medicine

## 2021-03-19 DIAGNOSIS — L649 Androgenic alopecia, unspecified: Secondary | ICD-10-CM | POA: Diagnosis not present

## 2021-03-28 DIAGNOSIS — M5416 Radiculopathy, lumbar region: Secondary | ICD-10-CM | POA: Diagnosis not present

## 2021-04-02 DIAGNOSIS — J449 Chronic obstructive pulmonary disease, unspecified: Secondary | ICD-10-CM | POA: Diagnosis not present

## 2021-04-02 DIAGNOSIS — E7800 Pure hypercholesterolemia, unspecified: Secondary | ICD-10-CM | POA: Diagnosis not present

## 2021-04-02 DIAGNOSIS — M8580 Other specified disorders of bone density and structure, unspecified site: Secondary | ICD-10-CM | POA: Diagnosis not present

## 2021-04-02 DIAGNOSIS — I10 Essential (primary) hypertension: Secondary | ICD-10-CM | POA: Diagnosis not present

## 2021-04-02 DIAGNOSIS — I25810 Atherosclerosis of coronary artery bypass graft(s) without angina pectoris: Secondary | ICD-10-CM | POA: Diagnosis not present

## 2021-04-02 DIAGNOSIS — Z853 Personal history of malignant neoplasm of breast: Secondary | ICD-10-CM | POA: Diagnosis not present

## 2021-04-02 DIAGNOSIS — K219 Gastro-esophageal reflux disease without esophagitis: Secondary | ICD-10-CM | POA: Diagnosis not present

## 2021-04-02 DIAGNOSIS — M19039 Primary osteoarthritis, unspecified wrist: Secondary | ICD-10-CM | POA: Diagnosis not present

## 2021-04-22 DIAGNOSIS — M5416 Radiculopathy, lumbar region: Secondary | ICD-10-CM | POA: Diagnosis not present

## 2021-05-02 DIAGNOSIS — I25810 Atherosclerosis of coronary artery bypass graft(s) without angina pectoris: Secondary | ICD-10-CM | POA: Diagnosis not present

## 2021-05-02 DIAGNOSIS — J449 Chronic obstructive pulmonary disease, unspecified: Secondary | ICD-10-CM | POA: Diagnosis not present

## 2021-05-02 DIAGNOSIS — Z853 Personal history of malignant neoplasm of breast: Secondary | ICD-10-CM | POA: Diagnosis not present

## 2021-05-02 DIAGNOSIS — M8580 Other specified disorders of bone density and structure, unspecified site: Secondary | ICD-10-CM | POA: Diagnosis not present

## 2021-05-02 DIAGNOSIS — K219 Gastro-esophageal reflux disease without esophagitis: Secondary | ICD-10-CM | POA: Diagnosis not present

## 2021-05-02 DIAGNOSIS — M19039 Primary osteoarthritis, unspecified wrist: Secondary | ICD-10-CM | POA: Diagnosis not present

## 2021-05-02 DIAGNOSIS — E7800 Pure hypercholesterolemia, unspecified: Secondary | ICD-10-CM | POA: Diagnosis not present

## 2021-05-02 DIAGNOSIS — I10 Essential (primary) hypertension: Secondary | ICD-10-CM | POA: Diagnosis not present

## 2021-05-06 DIAGNOSIS — M25552 Pain in left hip: Secondary | ICD-10-CM | POA: Diagnosis not present

## 2021-05-24 DIAGNOSIS — Z853 Personal history of malignant neoplasm of breast: Secondary | ICD-10-CM | POA: Diagnosis not present

## 2021-05-24 DIAGNOSIS — I25810 Atherosclerosis of coronary artery bypass graft(s) without angina pectoris: Secondary | ICD-10-CM | POA: Diagnosis not present

## 2021-05-24 DIAGNOSIS — I10 Essential (primary) hypertension: Secondary | ICD-10-CM | POA: Diagnosis not present

## 2021-05-24 DIAGNOSIS — J449 Chronic obstructive pulmonary disease, unspecified: Secondary | ICD-10-CM | POA: Diagnosis not present

## 2021-05-24 DIAGNOSIS — M19039 Primary osteoarthritis, unspecified wrist: Secondary | ICD-10-CM | POA: Diagnosis not present

## 2021-05-24 DIAGNOSIS — M8580 Other specified disorders of bone density and structure, unspecified site: Secondary | ICD-10-CM | POA: Diagnosis not present

## 2021-05-24 DIAGNOSIS — K219 Gastro-esophageal reflux disease without esophagitis: Secondary | ICD-10-CM | POA: Diagnosis not present

## 2021-05-24 DIAGNOSIS — E7800 Pure hypercholesterolemia, unspecified: Secondary | ICD-10-CM | POA: Diagnosis not present

## 2021-06-05 ENCOUNTER — Other Ambulatory Visit: Payer: Self-pay

## 2021-06-05 ENCOUNTER — Encounter: Payer: Self-pay | Admitting: Internal Medicine

## 2021-06-05 ENCOUNTER — Ambulatory Visit: Payer: PPO | Admitting: Internal Medicine

## 2021-06-05 VITALS — BP 156/60 | HR 69 | Ht 66.0 in | Wt 140.0 lb

## 2021-06-05 DIAGNOSIS — I493 Ventricular premature depolarization: Secondary | ICD-10-CM

## 2021-06-05 DIAGNOSIS — R0602 Shortness of breath: Secondary | ICD-10-CM

## 2021-06-05 DIAGNOSIS — I10 Essential (primary) hypertension: Secondary | ICD-10-CM | POA: Diagnosis not present

## 2021-06-05 DIAGNOSIS — I2510 Atherosclerotic heart disease of native coronary artery without angina pectoris: Secondary | ICD-10-CM

## 2021-06-05 DIAGNOSIS — I491 Atrial premature depolarization: Secondary | ICD-10-CM | POA: Diagnosis not present

## 2021-06-05 DIAGNOSIS — E785 Hyperlipidemia, unspecified: Secondary | ICD-10-CM | POA: Diagnosis not present

## 2021-06-05 DIAGNOSIS — R0600 Dyspnea, unspecified: Secondary | ICD-10-CM

## 2021-06-05 MED ORDER — AMLODIPINE BESYLATE 2.5 MG PO TABS: 2.5000 mg | ORAL_TABLET | Freq: Every evening | ORAL | 1 refills | Status: DC

## 2021-06-05 NOTE — Patient Instructions (Signed)
Medication Instructions:   Your physician has recommended you make the following change in your medication:   START Amlodipine 2.5 mg - one tablet every EVENING  *If you need a refill on your cardiac medications before your next appointment, please call your pharmacy*   Lab Work:  TODAY: BMET, Magnesium   If you have labs (blood work) drawn today and your tests are completely normal, you will receive your results only by: [place] (if you have MyChart) OR A paper copy in the mail If you have any lab test that is abnormal or we need to change your treatment, we will call you to review the results.   Testing/Procedures:  Your physician has requested that you have an echocardiogram. Echocardiography is a painless test that uses sound waves to create images of your heart. It provides your doctor with information about the size and shape of your heart and how well your heart's chambers and valves are working. This procedure takes approximately one hour. There are no restrictions for this procedure.   Follow-Up: At [facility], you and your health needs are our priority.  As part of our continuing mission to provide you with exceptional heart care, we have created designated Provider Care Teams.  These Care Teams include your primary Cardiologist (physician) and Advanced Practice Providers (APPs -  Physician Assistants and Nurse Practitioners) who all work together to provide you with the care you need, when you need it.  We recommend signing up for the patient portal called "MyChart".  Sign up information is provided on this After Visit Summary.  MyChart is used to connect with patients for Virtual Visits (Telemedicine).  Patients are able to view lab/test results, encounter notes, upcoming appointments, etc.  Non-urgent messages can be sent to your provider as well.   To learn more about what you can do with MyChart, go to [email or web address].    Your next appointment:   6  week(s)  The format for your next appointment:   In Person  Provider:   You may see [name], MD or one of the following Advanced Practice Providers on your designated Care Team:   Murray Hodgkins, NP [name], PA-C Marrianne Mood, PA-C [place], [place]

## 2021-06-05 NOTE — Progress Notes (Signed)
Follow-up Outpatient Visit Date: 06/05/2021  Primary Care Provider: Lavone Orn, MD [place] [address] [place] [address]  Chief Complaint: Follow-up coronary artery disease and shortness of breath  HPI:  Ms. Tiffany Caldwell is a 80 y.o. female with history of coronary artery disease status post remote PCI and subsequent emergent CABG for iatrogenic LMCA/LAD dissection in 2001, hypertension, hyperlipidemia, and right lower lobe cavitary lung mass, who presents for follow-up of coronary artery disease.  I last saw her in 05/2020, at which time her chronic exertional dyspnea was stable or slightly improved from prior visits.  She has continued to follow with Dr. Lamonte Sakai for management of her COPD and MAI infection.  We did not make any medication changes or pursue additional testing.  Today, Tiffany Caldwell reports that she has continued exertional dyspnea that might be slightly worse than at prior visits.  She has not had any chest pain, palpitations, lightheadedness, or edema.  She notes that her blood pressure is sometimes mildly elevated, particularly in the afternoons when her systolic readings are in the 150s.  She remains compliant with her medications.  --------------------------------------------------------------------------------------------------   Cardiovascular History & Procedures: Cardiovascular Problems: Coronary artery disease status post PCI and subsequent CABG (iatrogenic LMCA dissection)   Risk Factors: Known coronary artery disease, hypertension, hyperlipidemia, and age   Cath/PCI: LHC/PCI (12/16/99): LMCA normal. LAD with 80 and 90% mid vessel stenosis. LCx without significant disease. RCA with 30% proximal stenosis. Successful PCI to mid LAD with a NIR Royale 3.0 x 15 mm bare-metal stent. LHC/PCI (05/28/00): LMCA normal. LAD with 80-90% mid vessel in-stent restenosis. LCx normal. RCA with 30-40% proximal as well as ostial spasm. Plan angioplasty of previous stent  complicated by proximal LAD dissection with placement of NIR Elite 3.5 x 12 mm bare-metal stent. Further propagation of the dissection to the LMCA occurred, prompting referral for urgent CABG.   CV Surgery: Urgent CABG (05/28/00): LIMA to LAD, SVG to diagonal, SVG to OM.   EP Procedures and Devices: None   Non-Invasive Evaluation(s): Pharmacologic MPI (08/05/2019): Low risk study without ischemia or scar.  LVEF greater than 65%.  Aortic atherosclerosis and coronary artery calcification noted. TTE (11/08/14): Normal LV size with mild concentric LVH. LVEF 55-60% without regional wall motion abnormalities. Grade 1 diastolic dysfunction. Left atrium is severely dilated. RV size is normal with normal contraction. Mild TR. Otherwise no significant valvular abnormalities.  Recent CV Pertinent Labs: Lab Results  Component Value Date   CHOL 155 12/08/2017   HDL 72 12/08/2017   LDLCALC 67 12/08/2017   TRIG 79 12/08/2017   CHOLHDL 2.2 12/08/2017   INR 1.06 11/20/2016   K 3.8 08/04/2019   BUN 33 (H) 08/04/2019   CREATININE 1.06 (H) 08/04/2019    Past medical and surgical history were reviewed and updated in EPIC.  Current Meds  Medication Sig   acetaminophen (TYLENOL) 500 MG tablet Take 1,000 mg by mouth 2 (two) times daily.    albuterol (PROAIR HFA) 108 (90 Base) MCG/ACT inhaler Inhale 1-2 puffs into the lungs every 6 (six) hours as needed for wheezing or shortness of breath.   ANORO ELLIPTA 62.5-25 MCG/INH AEPB TAKE 1 PUFF BY MOUTH EVERY DAY   aspirin EC 81 MG tablet Take 81 mg by mouth daily.   atorvastatin (LIPITOR) 20 MG tablet TAKE ONE TABLET BY MOUTH ONCE DAILY   Biotin 10000 MCG TABS Take 1 tablet by mouth daily.   Calcium Carbonate-Vitamin D (CALCIUM 600 + D PO) Take  1 tablet by mouth daily.    Carboxymethylcell-Hypromellose (GENTEAL OP) Place 1 drop into both eyes at bedtime.   fexofenadine (ALLEGRA) 180 MG tablet Take 180 mg by mouth daily.   fish oil-omega-3 fatty acids 1000 MG  capsule Take 1 g by mouth daily.    gabapentin (NEURONTIN) 300 MG capsule Take 300 mg by mouth 4 (four) times daily.    Glucosamine-Chondroit-Vit C-Mn (GLUCOSAMINE CHONDR 1500 COMPLX PO) Take 1 tablet by mouth 2 (two) times daily.   hyoscyamine (LEVBID) 0.375 MG 12 hr tablet Take 0.375 mg by mouth every 12 (twelve) hours as needed for cramping (IBS).    Multiple Vitamin (MULTIVITAMIN) tablet Take 1 tablet by mouth daily.     nitroGLYCERIN (NITROSTAT) 0.4 MG SL tablet Place 0.4 mg under the tongue every 5 (five) minutes as needed for chest pain.   valsartan-hydrochlorothiazide (DIOVAN-HCT) 80-12.5 MG tablet Take 1 tablet by mouth daily.    Allergies: Hydromorphone, Azithromycin, Ciprofloxacin, Hydromorphone hcl, Levofloxacin, and Lisinopril  Social History   Tobacco Use   Smoking status: Former    Years: 30.00    Types: Cigarettes    Quit date: 07/31/1989    Years since quitting: 31.8   Smokeless tobacco: Never  Vaping Use   Vaping Use: Never used  Substance Use Topics   Alcohol use: Yes    Alcohol/week: 1.0 standard drink    Types: 1 Glasses of wine per week    Comment: occasional wine   Drug use: No    Family History  Problem Relation Age of Onset   Emphysema Father        deceased   Heart disease Mother        deceased   Colon cancer Paternal Grandmother    Colon cancer Son    Colon cancer Paternal Uncle     Review of Systems: A 12-system review of systems was performed and was negative except as noted in the HPI.  --------------------------------------------------------------------------------------------------  Physical Exam: BP (!) 156/60 (BP Location: Left Arm, Patient Position: Sitting, Cuff Size: Normal)   Pulse 69   Ht '5\' 6"'$  (1.676 m)   Wt 140 lb (63.5 kg)   LMP  (LMP Unknown)   SpO2 93%   BMI 22.60 kg/m   General:  NAD. Neck: No JVD or HJR. Lungs: Mildly diminished breath sounds throughout without wheezes or crackles. Heart: Regular rate and rhythm  with occasional extrasystoles.  No murmurs. Abdomen: Soft, nontender, nondistended. Extremities: No lower extremity edema.  EKG: Normal sinus rhythm with PACs and PVCs as well as LVH.  PACs and PVCs are new since 05/23/2020.  Otherwise, there has been no significant interval change.  Lab Results  Component Value Date   WBC 4.0 08/04/2019   HGB 12.2 08/04/2019   HCT 37.8 08/04/2019   MCV 103.0 (H) 08/04/2019   PLT 137 (L) 08/04/2019    Lab Results  Component Value Date   NA 142 08/04/2019   K 3.8 08/04/2019   CL 103 08/04/2019   CO2 30 08/04/2019   BUN 33 (H) 08/04/2019   CREATININE 1.06 (H) 08/04/2019   GLUCOSE 103 (H) 08/04/2019   ALT 25 08/04/2019    Lab Results  Component Value Date   CHOL 155 12/08/2017   HDL 72 12/08/2017   LDLCALC 67 12/08/2017   TRIG 79 12/08/2017   CHOLHDL 2.2 12/08/2017    --------------------------------------------------------------------------------------------------  ASSESSMENT AND PLAN: Coronary artery disease without angina: Tiffany Caldwell has not had any chest pain.  Query if  her shortness of breath is an anginal equivalent though I suspect it is more likely related to her underlying lung disease.  We will continue current medications for secondary prevention and obtain an echocardiogram to exclude new cardiomyopathy.  Shortness of breath: Longstanding but may be a little bit worse compared to years past.  EKG today with LVH and supraventricular and ventricular ectopy.  I recommended that we obtain an echocardiogram to exclude developing cardiomyopathy.  PACs and PVCs: Asymptomatic but incidentally noted on EKG today.  I will check a BMP and magnesium level to ensure appropriate electrolytes.  We will also obtain an echocardiogram to ensure new structural abnormality has not developed.  Hypertension: Blood pressure mildly elevated today.  We have agreed to add amlodipine 2.5 mg nightly.  Continue current dose of  valsartan-HCTZ.  Hyperlipidemia: Lipids well controlled on last check in our system.  More recent labs have been obtained by Dr. Laurann Montana (not available for review today).  Continue atorvastatin 20 mg daily with target LDL less than 70.  Follow-up: Return to clinic in 6 weeks.  [name], MD 06/05/2021 3:51 PM

## 2021-06-06 ENCOUNTER — Encounter: Payer: Self-pay | Admitting: Internal Medicine

## 2021-06-06 DIAGNOSIS — I493 Ventricular premature depolarization: Secondary | ICD-10-CM | POA: Insufficient documentation

## 2021-06-06 LAB — MAGNESIUM: Magnesium: 2.1 mg/dL (ref 1.6–2.3)

## 2021-06-06 LAB — BASIC METABOLIC PANEL
BUN/Creatinine Ratio: 26 (ref 12–28)
BUN: 29 mg/dL — ABNORMAL HIGH (ref 8–27)
CO2: 27 mmol/L (ref 20–29)
Calcium: 10.6 mg/dL — ABNORMAL HIGH (ref 8.7–10.3)
Chloride: 101 mmol/L (ref 96–106)
Creatinine, Ser: 1.13 mg/dL — ABNORMAL HIGH (ref 0.57–1.00)
Glucose: 95 mg/dL (ref 65–99)
Potassium: 4.6 mmol/L (ref 3.5–5.2)
Sodium: 140 mmol/L (ref 134–144)
eGFR: 49 mL/min/{1.73_m2} — ABNORMAL LOW (ref >59)

## 2021-06-13 ENCOUNTER — Telehealth: Payer: Self-pay | Admitting: Internal Medicine

## 2021-06-13 NOTE — Telephone Encounter (Signed)
Pt c/o swelling: STAT is pt has developed SOB within 24 hours  If swelling, where is the swelling located? Both feet & ankles  How much weight have you gained and in what time span? 4-5lbs since 9/1  Have you gained 3 pounds in a day or 5 pounds in a week? yes  Do you have a log of your daily weights (if so, list)? 132lbs,135lbs-138lbs  Are you currently taking a fluid pill? no  Are you currently SOB? no  Have you traveled recently? no

## 2021-06-14 NOTE — Telephone Encounter (Signed)
Spoke with pt.  Pt was started on Amlodipine 2.5 mg daily at last ov 06/05/21.  Pt reports soon after starting Amlodipine, "I noticed my weight increasing."  Wt gain of 5 lbs since 06/06/21.  Pt c/o swelling in both feet and ankles. Denies any problems with swelling previously.   Baseline weight per pt is 133 lb. This AM weight is 137 lb. Pt denies shortness of breath or any other associated symptoms.  Pt does confirm she was taking Amlodipine in the evening. Pt stopped taking Amlodipine on her own yesterday.  Pt reports SBP has been running 120s - 130s except one day it was 145.  Notified pt I will make Dr. Saunders Revel aware and will call her with recc.

## 2021-06-14 NOTE — Telephone Encounter (Signed)
Spoke with pt.  Notified pt of Dr. Darnelle Bos recc.  Pt voiced understanding.  Pt will stop Amlodipine.  Will monitor BP, weight, and swelling and will let us know next week how she responded.  Pt has no further questions at this time.

## 2021-06-14 NOTE — Telephone Encounter (Signed)
Please have Tiffany Caldwell stop amlodipine and continue monitoring her BP, weight, and swelling.  She should update [place] on her response next week.  Tiffany Bush, MD [facility]

## 2021-06-14 NOTE — Telephone Encounter (Signed)
Patient calling to check status of call back .  Patient notes she recently started norvasc  and believes she is having a reaction to it.

## 2021-06-17 NOTE — Telephone Encounter (Signed)
Patient calling with update  States BP is high in evenings 09/11 10:30p 156/77 09/12 morning 131/64  After medication 3 hours later  126/71 Please call to discuss

## 2021-06-17 NOTE — Telephone Encounter (Signed)
I called and spoke with the patient. She advised since stopping amlodipine on 06/13/21, her lower extremity edema is much improved and her weight is down from 139 lbs >> 136 lbs today. The patient reports feeling more sleepy when sitting down to read/ watch TV, which is new for her. She was having extreme fatigue on amlodipine, but this has improved.  No other associated symptoms are reported  BP's have been: 06/15/21 0900- 142/69 (before meds) 1300- 124/68 2200- 149/75  06/16/21 AM (before meds)- 138/73 PM- 156/77  06/17/21 AM (before meds)- 131/64 1300- 126/71   The patient is aware I will forward to Dr. Saunders Revel to review and provide further recommendations as her PM readings are elevated- this was of concern to her as well.  She is aware we will call back once Dr. Saunders Revel has reviewed to provide any new recommendations.   The patient voices understanding and is agreeable.

## 2021-06-18 DIAGNOSIS — Z1231 Encounter for screening mammogram for malignant neoplasm of breast: Secondary | ICD-10-CM | POA: Diagnosis not present

## 2021-06-18 NOTE — Telephone Encounter (Signed)
Thank you for the update.  Lets continue to hold amlodipine and monitor blood pressure.  We will follow-up as scheduled next month.  [name], MD [facility]

## 2021-06-18 NOTE — Telephone Encounter (Signed)
Spoke with pt.  Made aware of Dr. Darnelle Bos recc.  Pt voiced understanding.  Pt will continue to hold Amlodipine and monitor blood pressure.  Pt will follow up as scheduled 07/19/21.  Pt has no further questions at this time.

## 2021-06-21 ENCOUNTER — Other Ambulatory Visit: Payer: Self-pay | Admitting: Internal Medicine

## 2021-06-24 DIAGNOSIS — I25810 Atherosclerosis of coronary artery bypass graft(s) without angina pectoris: Secondary | ICD-10-CM | POA: Diagnosis not present

## 2021-06-24 DIAGNOSIS — I10 Essential (primary) hypertension: Secondary | ICD-10-CM | POA: Diagnosis not present

## 2021-06-24 DIAGNOSIS — K219 Gastro-esophageal reflux disease without esophagitis: Secondary | ICD-10-CM | POA: Diagnosis not present

## 2021-06-24 DIAGNOSIS — E7800 Pure hypercholesterolemia, unspecified: Secondary | ICD-10-CM | POA: Diagnosis not present

## 2021-06-24 DIAGNOSIS — M8580 Other specified disorders of bone density and structure, unspecified site: Secondary | ICD-10-CM | POA: Diagnosis not present

## 2021-06-24 DIAGNOSIS — M19039 Primary osteoarthritis, unspecified wrist: Secondary | ICD-10-CM | POA: Diagnosis not present

## 2021-06-24 DIAGNOSIS — J449 Chronic obstructive pulmonary disease, unspecified: Secondary | ICD-10-CM | POA: Diagnosis not present

## 2021-07-01 ENCOUNTER — Telehealth: Payer: Self-pay | Admitting: Internal Medicine

## 2021-07-01 NOTE — Telephone Encounter (Signed)
Lets have Tiffany Caldwell continue to monitor her blood pressure at home.  While some readings are mildly-moderately elevated in the evenings, I think it is okay to continue watching them.  She should continue to minimize her sodium intake.  If this blood pressure trend continues, we could consider splitting her Diovan-HCT combination pill into HCTZ 12.5 mg every morning and valsartan 80 mg every afternoon for more consistent blood pressure control when we follow-up in the office next month.  Tiffany Bush, MD [facility]

## 2021-07-01 NOTE — Telephone Encounter (Signed)
Patient calling to update with some BP readings 09/25 - morning - 110/60 09/24 - night - 154/71 09/22 - night - 159/79  States it is usually high at night - has some fatigue Please call to discuss

## 2021-07-01 NOTE — Telephone Encounter (Signed)
Reviewed recommendations from provider and discussed plan and to keep upcoming follow up appointment. She was agreeable with this plan and had no further questions at this time.

## 2021-07-01 NOTE — Telephone Encounter (Signed)
Spoke with patient and she is concerned regarding her elevated blood pressures at night. She reports ranges from 149-159's over 70-83's. In the mornings they range 110-114 range with no concerns. Advised that she can send some readings via My Chart but that I would send over to Dr. Saunders Revel to review her concerns. She does have upcoming appointment on 07/19/2021 and requested that she monitor and keep a log for her to review at that appointment. Reviewed that we would give her a call back with any recommendations he may have. She verbalized understanding with no further questions at this time.   Current Cardiology medications as follows:  valsartan-hydrochlorothiazide (DIOVAN-HCT) 80-12.5 MG tablet Take 1 tablet by mouth daily.   aspirin EC 81 MG tablet Take 81 mg by mouth daily.  atorvastatin (LIPITOR) 20 MG tablet TAKE ONE TABLET BY MOUTH ONCE DAILY

## 2021-07-04 ENCOUNTER — Other Ambulatory Visit: Payer: PPO

## 2021-07-09 ENCOUNTER — Other Ambulatory Visit: Payer: Self-pay

## 2021-07-09 ENCOUNTER — Ambulatory Visit (INDEPENDENT_AMBULATORY_CARE_PROVIDER_SITE_OTHER): Payer: PPO

## 2021-07-09 DIAGNOSIS — R0602 Shortness of breath: Secondary | ICD-10-CM

## 2021-07-09 LAB — ECHOCARDIOGRAM COMPLETE
AR max vel: 1.99 cm2
AV Area VTI: 1.86 cm2
AV Area mean vel: 1.82 cm2
AV Mean grad: 6 mmHg
AV Peak grad: 10.5 mmHg
Ao pk vel: 1.62 m/s
Area-P 1/2: 3.17 cm2
Calc EF: 73.5 %
P 1/2 time: 590 msec
S' Lateral: 2.5 cm
Single Plane A2C EF: 77.3 %
Single Plane A4C EF: 67.9 %

## 2021-07-19 ENCOUNTER — Encounter: Payer: Self-pay | Admitting: Internal Medicine

## 2021-07-19 ENCOUNTER — Other Ambulatory Visit: Payer: Self-pay

## 2021-07-19 ENCOUNTER — Ambulatory Visit: Payer: PPO | Admitting: Internal Medicine

## 2021-07-19 VITALS — BP 140/70 | HR 72 | Ht 65.0 in | Wt 139.0 lb

## 2021-07-19 DIAGNOSIS — Z79899 Other long term (current) drug therapy: Secondary | ICD-10-CM

## 2021-07-19 DIAGNOSIS — I10 Essential (primary) hypertension: Secondary | ICD-10-CM

## 2021-07-19 DIAGNOSIS — I2510 Atherosclerotic heart disease of native coronary artery without angina pectoris: Secondary | ICD-10-CM | POA: Diagnosis not present

## 2021-07-19 DIAGNOSIS — R0609 Other forms of dyspnea: Secondary | ICD-10-CM

## 2021-07-19 MED ORDER — VALSARTAN-HYDROCHLOROTHIAZIDE 160-12.5 MG PO TABS: 1.0000 | ORAL_TABLET | Freq: Every day | ORAL | 3 refills | Status: DC

## 2021-07-19 NOTE — Progress Notes (Signed)
Follow-up Outpatient Visit Date: 07/19/2021  Primary Care Provider: Lavone Orn, MD [place] [address] [place] [address]  Chief Complaint: Follow-up hypertension and dyspnea on exertion  HPI:  Tiffany Caldwell is a 80 y.o. female with history of coronary artery disease status post remote PCI and subsequent emergent CABG for iatrogenic LMCA/LAD dissection in 2001, hypertension, hyperlipidemia, and right lower lobe cavitary lung mass, who presents for follow-up of coronary artery disease and hypertension.  I last saw her in late August, at which time she reported slight bursting of chronic exertional dyspnea.  She also reported intermittent elevated blood pressure readings.  We agreed to obtain an echocardiogram, which showed preserved LVEF.  We also started amlodipine 2.5 mg daily.  She reported "extreme fatigue" on amlodipine, prompting her to stop the medicine after only a few days.  Today, [name] reports that she is feeling better after discontinuation of amlodipine.  Her "extreme fatigue" has resolved as has her leg swelling.  She denies chest pain, shortness of breath, palpitations, or lightheadedness.  Home blood pressures are somewhat labile but average in the 299-371 systolic range.  She remains on her current regimen of amlodipine-HCTZ.  She has been trying to minimize her sodium intake.  --------------------------------------------------------------------------------------------------  Cardiovascular History & Procedures: Cardiovascular Problems: Coronary artery disease status post PCI and subsequent CABG (iatrogenic LMCA dissection)   Risk Factors: Known coronary artery disease, hypertension, hyperlipidemia, and age   Cath/PCI: LHC/PCI (12/16/99): LMCA normal. LAD with 80 and 90% mid vessel stenosis. LCx without significant disease. RCA with 30% proximal stenosis. Successful PCI to mid LAD with a NIR Royale 3.0 x 15 mm bare-metal stent. LHC/PCI (05/28/00): LMCA  normal. LAD with 80-90% mid vessel in-stent restenosis. LCx normal. RCA with 30-40% proximal as well as ostial spasm. Plan angioplasty of previous stent complicated by proximal LAD dissection with placement of NIR Elite 3.5 x 12 mm bare-metal stent. Further propagation of the dissection to the LMCA occurred, prompting referral for urgent CABG.   CV Surgery: Urgent CABG (05/28/00): LIMA to LAD, SVG to diagonal, SVG to OM.   EP Procedures and Devices: None   Non-Invasive Evaluation(s): TTE (07/09/2021): Normal LV size with borderline LVH.  LVEF 65-70% with normal wall motion and grade 1 diastolic dysfunction.  Normal RV size and function.  Normal PA pressure.  Mild left atrial enlargement.  Trivial mitral regurgitation.  Mild aortic and tricuspid regurgitation.  Normal CVP. Pharmacologic MPI (08/05/2019): Low risk study without ischemia or scar.  LVEF greater than 65%.  Aortic atherosclerosis and coronary artery calcification noted. TTE (11/08/14): Normal LV size with mild concentric LVH. LVEF 55-60% without regional wall motion abnormalities. Grade 1 diastolic dysfunction. Left atrium is severely dilated. RV size is normal with normal contraction. Mild TR. Otherwise no significant valvular abnormalities.  Recent CV Pertinent Labs: Lab Results  Component Value Date   CHOL 155 12/08/2017   HDL 72 12/08/2017   LDLCALC 67 12/08/2017   TRIG 79 12/08/2017   CHOLHDL 2.2 12/08/2017   INR 1.06 11/20/2016   K 4.6 06/05/2021   MG 2.1 06/05/2021   BUN 29 (H) 06/05/2021   CREATININE 1.13 (H) 06/05/2021    Past medical and surgical history were reviewed and updated in EPIC.  Current Meds  Medication Sig   acetaminophen (TYLENOL) 500 MG tablet Take 1,000 mg by mouth 2 (two) times daily.    albuterol (PROAIR HFA) 108 (90 Base) MCG/ACT inhaler Inhale 1-2 puffs into the lungs every 6 (six)  hours as needed for wheezing or shortness of breath.   ANORO ELLIPTA 62.5-25 MCG/INH AEPB TAKE 1 PUFF BY MOUTH EVERY  DAY   aspirin EC 81 MG tablet Take 81 mg by mouth daily.   atorvastatin (LIPITOR) 20 MG tablet TAKE ONE TABLET BY MOUTH ONCE DAILY   Biotin 10000 MCG TABS Take 1 tablet by mouth daily.   Calcium Carbonate-Vitamin D (CALCIUM 600 + D PO) Take 1 tablet by mouth daily.    Carboxymethylcell-Hypromellose (GENTEAL OP) Place 1 drop into both eyes at bedtime.   fexofenadine (ALLEGRA) 180 MG tablet Take 180 mg by mouth daily.   fish oil-omega-3 fatty acids 1000 MG capsule Take 1 g by mouth daily.    gabapentin (NEURONTIN) 300 MG capsule Take 300 mg by mouth 4 (four) times daily.    Glucosamine-Chondroit-Vit C-Mn (GLUCOSAMINE CHONDR 1500 COMPLX PO) Take 1 tablet by mouth 2 (two) times daily.   hyoscyamine (LEVBID) 0.375 MG 12 hr tablet Take 0.375 mg by mouth every 12 (twelve) hours as needed for cramping (IBS).    Multiple Vitamin (MULTIVITAMIN) tablet Take 1 tablet by mouth daily.     nitroGLYCERIN (NITROSTAT) 0.4 MG SL tablet Place 0.4 mg under the tongue every 5 (five) minutes as needed for chest pain.   valsartan-hydrochlorothiazide (DIOVAN-HCT) 80-12.5 MG tablet Take 1 tablet by mouth daily.    Allergies: Hydromorphone, Azithromycin, Ciprofloxacin, Hydromorphone hcl, Levofloxacin, and Lisinopril  Social History   Tobacco Use   Smoking status: Former    Years: 30.00    Types: Cigarettes    Quit date: 07/31/1989    Years since quitting: 31.9   Smokeless tobacco: Never  Vaping Use   Vaping Use: Never used  Substance Use Topics   Alcohol use: Yes    Alcohol/week: 1.0 standard drink    Types: 1 Glasses of wine per week    Comment: occasional wine   Drug use: No    Family History  Problem Relation Age of Onset   Emphysema Father        deceased   Heart disease Mother        deceased   Colon cancer Paternal Grandmother    Colon cancer Son    Colon cancer Paternal Uncle     Review of Systems: A 12-system review of systems was performed and was negative except as noted in the  HPI.  --------------------------------------------------------------------------------------------------  Physical Exam: BP 140/70 (BP Location: Left Arm, Patient Position: Sitting, Cuff Size: Large)   Pulse 72   Ht 5\' 5"  (1.651 m)   Wt 139 lb (63 kg)   LMP  (LMP Unknown)   SpO2 94%   BMI 23.13 kg/m   General:  NAD. Neck: No JVD or HJR. Lungs: Clear to auscultation bilaterally without wheezes or crackles. Heart: Regular rate and rhythm without murmurs, rubs, or gallops. Abdomen: Soft, nontender, nondistended. Extremities: No lower extremity edema.   Lab Results  Component Value Date   WBC 4.0 08/04/2019   HGB 12.2 08/04/2019   HCT 37.8 08/04/2019   MCV 103.0 (H) 08/04/2019   PLT 137 (L) 08/04/2019    Lab Results  Component Value Date   NA 140 06/05/2021   K 4.6 06/05/2021   CL 101 06/05/2021   CO2 27 06/05/2021   BUN 29 (H) 06/05/2021   CREATININE 1.13 (H) 06/05/2021   GLUCOSE 95 06/05/2021   ALT 25 08/04/2019    Lab Results  Component Value Date   CHOL 155 12/08/2017   HDL 72  12/08/2017   LDLCALC 67 12/08/2017   TRIG 79 12/08/2017   CHOLHDL 2.2 12/08/2017    --------------------------------------------------------------------------------------------------  ASSESSMENT AND PLAN: Dyspnea on exertion: Chronic dyspnea is back to baseline.  Recent echo without significant structural abnormalities.  Continue to work on blood pressure control.  No additional testing recommended at this time.  Hypertension: Blood pressure mildly elevated today as well as frequently at home.  Given that Ms. Okubo did not tolerate addition of amlodipine, we have agreed to increase her valsartan-HCTZ to 160-12.5 mg daily.  We will plan to repeat at the next in about 2 weeks and have Ms. Kohlmeyer monitor her blood pressure closely at home, as she has been doing.  We will follow-up in 3 months, sooner if blood pressure control does not improve at home.  Coronary artery disease: No  chest pain reported.  Continue current medications for secondary prevention.  Ongoing lipid follow-up per Dr. Laurann Montana (goal LDL < 70, TG < 150).  Follow-up: Return to clinic in 3 months.  [name], MD 07/19/2021 4:16 PM

## 2021-07-19 NOTE — Patient Instructions (Addendum)
Medication Instructions:  - Your physician has recommended you make the following change in your medication:   1) INCREASE valsartan-hctz to 160-12.5 mg - take 1 tablet by mouth once daily   *If you need a refill on your cardiac medications before your next appointment, please call your pharmacy*   Lab Work: - Your physician recommends that you return for lab work in: 2 weeks- BMP  If you have labs (blood work) drawn today and your tests are completely normal, you will receive your results only by: [place] (if you have MyChart) OR A paper copy in the mail If you have any lab test that is abnormal or we need to change your treatment, we will call you to review the results.   Testing/Procedures: - none ordered   Follow-Up: At [facility], you and your health needs are our priority.  As part of our continuing mission to provide you with exceptional heart care, we have created designated Provider Care Teams.  These Care Teams include your primary Cardiologist (physician) and Advanced Practice Providers (APPs -  Physician Assistants and Nurse Practitioners) who all work together to provide you with the care you need, when you need it.  We recommend signing up for the patient portal called "MyChart".  Sign up information is provided on this After Visit Summary.  MyChart is used to connect with patients for Virtual Visits (Telemedicine).  Patients are able to view lab/test results, encounter notes, upcoming appointments, etc.  Non-urgent messages can be sent to your provider as well.   To learn more about what you can do with MyChart, go to [email or web address].    Your next appointment:   3 month(s)  The format for your next appointment:   In Person  Provider:   You may see [name], MD or one of the following Advanced Practice Providers on your designated Care Team:   Murray Hodgkins, NP [name], PA-C Marrianne Mood, PA-C Cadence Kathlen Mody, [place]   Other  Instructions  1) Monitor your blood pressure at home about 1-2 hours after you take your medications.  - please call the office if the numbers are running too high or they drop too low

## 2021-07-20 ENCOUNTER — Encounter: Payer: Self-pay | Admitting: Internal Medicine

## 2021-07-23 DIAGNOSIS — J449 Chronic obstructive pulmonary disease, unspecified: Secondary | ICD-10-CM | POA: Diagnosis not present

## 2021-07-23 DIAGNOSIS — M5136 Other intervertebral disc degeneration, lumbar region: Secondary | ICD-10-CM | POA: Diagnosis not present

## 2021-07-23 DIAGNOSIS — Z23 Encounter for immunization: Secondary | ICD-10-CM | POA: Diagnosis not present

## 2021-07-23 DIAGNOSIS — E210 Primary hyperparathyroidism: Secondary | ICD-10-CM | POA: Diagnosis not present

## 2021-07-23 DIAGNOSIS — Z1389 Encounter for screening for other disorder: Secondary | ICD-10-CM | POA: Diagnosis not present

## 2021-07-23 DIAGNOSIS — E7800 Pure hypercholesterolemia, unspecified: Secondary | ICD-10-CM | POA: Diagnosis not present

## 2021-07-23 DIAGNOSIS — I10 Essential (primary) hypertension: Secondary | ICD-10-CM | POA: Diagnosis not present

## 2021-07-23 DIAGNOSIS — H9191 Unspecified hearing loss, right ear: Secondary | ICD-10-CM | POA: Diagnosis not present

## 2021-07-23 DIAGNOSIS — K219 Gastro-esophageal reflux disease without esophagitis: Secondary | ICD-10-CM | POA: Diagnosis not present

## 2021-07-23 DIAGNOSIS — I700 Atherosclerosis of aorta: Secondary | ICD-10-CM | POA: Diagnosis not present

## 2021-07-23 DIAGNOSIS — R200 Anesthesia of skin: Secondary | ICD-10-CM | POA: Diagnosis not present

## 2021-07-23 DIAGNOSIS — Z0000 Encounter for general adult medical examination without abnormal findings: Secondary | ICD-10-CM | POA: Diagnosis not present

## 2021-07-23 DIAGNOSIS — I25810 Atherosclerosis of coronary artery bypass graft(s) without angina pectoris: Secondary | ICD-10-CM | POA: Diagnosis not present

## 2021-07-24 DIAGNOSIS — I10 Essential (primary) hypertension: Secondary | ICD-10-CM | POA: Diagnosis not present

## 2021-07-24 DIAGNOSIS — E7800 Pure hypercholesterolemia, unspecified: Secondary | ICD-10-CM | POA: Diagnosis not present

## 2021-07-24 DIAGNOSIS — M8580 Other specified disorders of bone density and structure, unspecified site: Secondary | ICD-10-CM | POA: Diagnosis not present

## 2021-07-24 DIAGNOSIS — M19039 Primary osteoarthritis, unspecified wrist: Secondary | ICD-10-CM | POA: Diagnosis not present

## 2021-07-24 DIAGNOSIS — K219 Gastro-esophageal reflux disease without esophagitis: Secondary | ICD-10-CM | POA: Diagnosis not present

## 2021-07-24 DIAGNOSIS — J449 Chronic obstructive pulmonary disease, unspecified: Secondary | ICD-10-CM | POA: Diagnosis not present

## 2021-07-24 DIAGNOSIS — I25810 Atherosclerosis of coronary artery bypass graft(s) without angina pectoris: Secondary | ICD-10-CM | POA: Diagnosis not present

## 2021-07-24 DIAGNOSIS — Z853 Personal history of malignant neoplasm of breast: Secondary | ICD-10-CM | POA: Diagnosis not present

## 2021-07-25 DIAGNOSIS — H6121 Impacted cerumen, right ear: Secondary | ICD-10-CM | POA: Diagnosis not present

## 2021-07-30 DIAGNOSIS — H52203 Unspecified astigmatism, bilateral: Secondary | ICD-10-CM | POA: Diagnosis not present

## 2021-07-30 DIAGNOSIS — Z961 Presence of intraocular lens: Secondary | ICD-10-CM | POA: Diagnosis not present

## 2021-07-31 ENCOUNTER — Telehealth: Payer: Self-pay | Admitting: Internal Medicine

## 2021-07-31 NOTE — Telephone Encounter (Signed)
Patient calling  Wants to update and discuss valsartan changes with nurse  Please call to discuss

## 2021-07-31 NOTE — Telephone Encounter (Signed)
Pt calling to give update since Valsartan-HCTZ was incr to 160-12.5 mg qd on 07/19/21.  Pt usually takes medication at 10 AM but reports the last two days below 10/25 and 10/26 were taken at 8 AM. Pt confirms checking BP ~2 hours after taking medication.  Pt does not report any symptoms, but feels her blood pressure is still "all over the place" even with incr dose.  Pt also reports that the evening BP readings are while pt is sitting and reading at home.   10/21 - 10 PM  153/73 66 10/22 - 10 AM 135/72  63             10 PM 151/82  67 10/23 - 1 PM   130/66  61             10 PM 146/73  68 10/24 (forgot to check in AM)             10 PM 179/87  73 10/25  9 AM 106/65  91            11 AM 98/62  76            10 PM 156/75  72 10/26  11 AM 133/69  63

## 2021-08-01 ENCOUNTER — Ambulatory Visit (INDEPENDENT_AMBULATORY_CARE_PROVIDER_SITE_OTHER)
Admission: RE | Admit: 2021-08-01 | Discharge: 2021-08-01 | Disposition: A | Discharge status: Home / Self Care | Payer: PPO | Source: Ambulatory Visit | Attending: Emergency Medicine | Admitting: Emergency Medicine

## 2021-08-01 ENCOUNTER — Other Ambulatory Visit: Payer: Self-pay

## 2021-08-01 DIAGNOSIS — R918 Other nonspecific abnormal finding of lung field: Secondary | ICD-10-CM | POA: Diagnosis not present

## 2021-08-01 DIAGNOSIS — R911 Solitary pulmonary nodule: Secondary | ICD-10-CM

## 2021-08-01 DIAGNOSIS — J439 Emphysema, unspecified: Secondary | ICD-10-CM | POA: Diagnosis not present

## 2021-08-01 DIAGNOSIS — I700 Atherosclerosis of aorta: Secondary | ICD-10-CM | POA: Diagnosis not present

## 2021-08-04 NOTE — Telephone Encounter (Signed)
I am not sure why Tiffany Caldwell's BP readings are so variable.  Makes me wonder about the reliability of her BP cuff.  I recommend continuing current medications and having her bring her cuff with her to her next office visit with [place].  [name], MD [facility]

## 2021-08-05 NOTE — Telephone Encounter (Signed)
Spoke with pt. Notified of Dr. Darnelle Bos recc.  Pt voiced understanding.  She actually has a lab appointment in our office tomorrow 08/06/21.  Pt will bring her cuff and I verified with [place], CMA who will be in the lab.  She will check pt's BP with personal cuff and then manual to verify accuracy.  Pt appreciative and has no further questions at this time.

## 2021-08-06 ENCOUNTER — Other Ambulatory Visit: Payer: Self-pay

## 2021-08-06 ENCOUNTER — Telehealth: Payer: Self-pay

## 2021-08-06 ENCOUNTER — Other Ambulatory Visit (INDEPENDENT_AMBULATORY_CARE_PROVIDER_SITE_OTHER): Payer: PPO

## 2021-08-06 DIAGNOSIS — I10 Essential (primary) hypertension: Secondary | ICD-10-CM

## 2021-08-06 DIAGNOSIS — Z79899 Other long term (current) drug therapy: Secondary | ICD-10-CM | POA: Diagnosis not present

## 2021-08-06 NOTE — Telephone Encounter (Signed)
MD aware

## 2021-08-06 NOTE — Telephone Encounter (Signed)
Patient came in today for lab work   She wanted to compare her BP cuff with what we get.  I checked her BP and got 144/78 -- Manual She then checked her BP and got 142/70 with her machine.   Patient wanted me to make Jinny Blossom, RN and Dr. Saunders Revel aware.

## 2021-08-07 LAB — BASIC METABOLIC PANEL
BUN/Creatinine Ratio: 25 (ref 12–28)
BUN: 29 mg/dL — ABNORMAL HIGH (ref 8–27)
CO2: 26 mmol/L (ref 20–29)
Calcium: 10.6 mg/dL — ABNORMAL HIGH (ref 8.7–10.3)
Chloride: 103 mmol/L (ref 96–106)
Creatinine, Ser: 1.16 mg/dL — ABNORMAL HIGH (ref 0.57–1.00)
Glucose: 89 mg/dL (ref 70–99)
Potassium: 4.8 mmol/L (ref 3.5–5.2)
Sodium: 141 mmol/L (ref 134–144)
eGFR: 47 mL/min/{1.73_m2} — ABNORMAL LOW (ref >59)

## 2021-08-07 NOTE — Telephone Encounter (Signed)
Noted thank you

## 2021-08-08 NOTE — Telephone Encounter (Signed)
Patient calling with BP update requesting to be called back before 130pm today

## 2021-08-08 NOTE — Telephone Encounter (Signed)
Spoke with pt.  Notified of Dr. Darnelle Bos recc below.  Pt voiced understanding of Dr. Darnelle Bos recc, however, states she has decided she will see her PCP regarding her BP concerns.  Advised pt that if she wishes to proceed with recc below to let us know.  Pt has no further questions or needs at this time.

## 2021-08-08 NOTE — Telephone Encounter (Signed)
Called pt back.  Pt expresses concern with her PM blood pressures listed below.  Pt did have home BP cuff checked in office 08/06/21 along with our manual cuff.  Verified home cuff is accurate.  I verified with pt that she denies any symptoms associated with blood pressures reported.  Advised pt that no changes made at this time and to follow up as scheduled.  Pt states she is still concerned and would like feedback if anything can be done to help bring PM blood pressure readings down.  Notified pt I will make Dr. Saunders Revel aware of her concern.

## 2021-08-08 NOTE — Telephone Encounter (Signed)
It is normal to see some variability in blood pressure throughout the day.  Accuracy of the patient's blood pressure cuff during visit to her visit to our office earlier this week noted.  I recommend that Tiffany Caldwell continue her current medications and  monitor her pressure once a day at a time when she is resting comfortably and has taken her morning medications.  If blood pressures remain consistently elevated, it might be worthwhile to refer her to the hypertension clinic in [facility] for further evaluation versus considering 24-hour ambulatory blood pressure monitor to objectively assess her blood pressure trend throughout the day.  [name], MD [facility]

## 2021-08-09 DIAGNOSIS — H16211 Exposure keratoconjunctivitis, right eye: Secondary | ICD-10-CM | POA: Diagnosis not present

## 2021-08-16 ENCOUNTER — Ambulatory Visit: Payer: PPO | Admitting: Emergency Medicine

## 2021-08-18 ENCOUNTER — Other Ambulatory Visit: Payer: Self-pay | Admitting: Emergency Medicine

## 2021-09-04 DIAGNOSIS — I10 Essential (primary) hypertension: Secondary | ICD-10-CM | POA: Diagnosis not present

## 2021-09-04 DIAGNOSIS — M19039 Primary osteoarthritis, unspecified wrist: Secondary | ICD-10-CM | POA: Diagnosis not present

## 2021-09-04 DIAGNOSIS — E7800 Pure hypercholesterolemia, unspecified: Secondary | ICD-10-CM | POA: Diagnosis not present

## 2021-09-04 DIAGNOSIS — J449 Chronic obstructive pulmonary disease, unspecified: Secondary | ICD-10-CM | POA: Diagnosis not present

## 2021-09-04 DIAGNOSIS — I25810 Atherosclerosis of coronary artery bypass graft(s) without angina pectoris: Secondary | ICD-10-CM | POA: Diagnosis not present

## 2021-09-04 DIAGNOSIS — M8580 Other specified disorders of bone density and structure, unspecified site: Secondary | ICD-10-CM | POA: Diagnosis not present

## 2021-09-04 DIAGNOSIS — K219 Gastro-esophageal reflux disease without esophagitis: Secondary | ICD-10-CM | POA: Diagnosis not present

## 2021-09-10 DIAGNOSIS — D0439 Carcinoma in situ of skin of other parts of face: Secondary | ICD-10-CM | POA: Diagnosis not present

## 2021-09-10 DIAGNOSIS — L570 Actinic keratosis: Secondary | ICD-10-CM | POA: Diagnosis not present

## 2021-09-10 DIAGNOSIS — D485 Neoplasm of uncertain behavior of skin: Secondary | ICD-10-CM | POA: Diagnosis not present

## 2021-09-11 DIAGNOSIS — M79644 Pain in right finger(s): Secondary | ICD-10-CM | POA: Diagnosis not present

## 2021-09-11 DIAGNOSIS — M79645 Pain in left finger(s): Secondary | ICD-10-CM | POA: Diagnosis not present

## 2021-09-18 DIAGNOSIS — I10 Essential (primary) hypertension: Secondary | ICD-10-CM | POA: Diagnosis not present

## 2021-09-18 DIAGNOSIS — E7800 Pure hypercholesterolemia, unspecified: Secondary | ICD-10-CM | POA: Diagnosis not present

## 2021-09-18 DIAGNOSIS — Z853 Personal history of malignant neoplasm of breast: Secondary | ICD-10-CM | POA: Diagnosis not present

## 2021-09-18 DIAGNOSIS — M19039 Primary osteoarthritis, unspecified wrist: Secondary | ICD-10-CM | POA: Diagnosis not present

## 2021-09-18 DIAGNOSIS — I25810 Atherosclerosis of coronary artery bypass graft(s) without angina pectoris: Secondary | ICD-10-CM | POA: Diagnosis not present

## 2021-09-18 DIAGNOSIS — K219 Gastro-esophageal reflux disease without esophagitis: Secondary | ICD-10-CM | POA: Diagnosis not present

## 2021-09-18 DIAGNOSIS — M8580 Other specified disorders of bone density and structure, unspecified site: Secondary | ICD-10-CM | POA: Diagnosis not present

## 2021-09-18 DIAGNOSIS — J449 Chronic obstructive pulmonary disease, unspecified: Secondary | ICD-10-CM | POA: Diagnosis not present

## 2021-10-02 ENCOUNTER — Other Ambulatory Visit: Payer: Self-pay

## 2021-10-02 ENCOUNTER — Encounter: Payer: Self-pay | Admitting: Emergency Medicine

## 2021-10-02 ENCOUNTER — Ambulatory Visit: Payer: PPO | Admitting: Emergency Medicine

## 2021-10-02 VITALS — BP 124/60 | HR 70 | Temp 97.7°F | Ht 65.0 in | Wt 136.4 lb

## 2021-10-02 DIAGNOSIS — J984 Other disorders of lung: Secondary | ICD-10-CM

## 2021-10-02 DIAGNOSIS — J449 Chronic obstructive pulmonary disease, unspecified: Secondary | ICD-10-CM

## 2021-10-02 DIAGNOSIS — R911 Solitary pulmonary nodule: Secondary | ICD-10-CM | POA: Diagnosis not present

## 2021-10-02 DIAGNOSIS — A310 Pulmonary mycobacterial infection: Secondary | ICD-10-CM

## 2021-10-02 DIAGNOSIS — R918 Other nonspecific abnormal finding of lung field: Secondary | ICD-10-CM | POA: Diagnosis not present

## 2021-10-02 NOTE — Patient Instructions (Addendum)
We reviewed your CT scan of the chest today.  This remains stable We will plan to repeat in October 2023 Try taking your albuterol 2 puffs in the morning when you get up to see if this helps with your fatigue and breathing.  Keep it available to use at other times of your shortness of breath, chest tightness, wheezing. Continue your Anoro 1 inhalation once daily. If your breathing and fatigue do not improve we could consider changing your Anoro to an alternative. Follow with Dr Lamonte Sakai in 6 months or sooner if you have any problems

## 2021-10-02 NOTE — Progress Notes (Signed)
° °  Subjective:    Patient ID: Tiffany Caldwell, female    DOB: Feb 15, 1941, 80 y.o.   MRN: [id number]  HPI  ROV 08/28/20 --this is a follow-up visit for Tiffany Caldwell.  She is 80 with moderate severe COPD, also history of CAD/CABG, breast cancer, Mycobacterium avium.  She was not treated for this.  She has an abnormal CT scan of the chest with a right lower lobe masslike lesion 3.5 cm and scattered pulmonary nodular disease, bronchiectasis.  We repeated her CT 07/27/2020 which I have reviewed, shows that the right lower lobe mass lesion is stable to mildly decreased in size with a new focal area of cavitary change in its medial aspect.  There is a stable 6 mm right lower lobe nodule  ROV 10/02/21 --80 year old woman with moderate least severe COPD, CAD/CABG, breast cancer, Mycobacterium avium (untreated).  We have been following an abnormal CT scan of the chest that was negative on bronchoscopy 2018.  That is when she was diagnosed with Mycobacterium.  This has some cavitation.  We repeated her CT chest 08/01/2021 She has been having some fatigue after she gets up to get ready in the am. She gets tired also with vacuuming. She gets some SOB with stairs. She uses Anoro reliably. She rarely every uses albuterol. She has been having her BP meds adjusted recently - still seeing some high values.   CT chest 08/01/2021 reviewed by me, shows stable 3 x 3.3 cm right lower lobe mass unchanged from 2020.  No adenopathy.  Stable unchanged scattered pulmonary nodular disease   Review of Systems  Respiratory:  Positive for shortness of breath.   Musculoskeletal:  Positive for arthralgias.     Objective:   Physical Exam Vitals:   10/02/21 1509  BP: 124/60  Pulse: 70  Temp: 97.7 F (36.5 C)  TempSrc: Oral  SpO2: 95%  Weight: 136 lb 6.4 oz (61.9 kg)  Height: 5\' 5"  (1.651 m)   Gen: Pleasant, well-nourished, in no distress,  normal affect, moderate kyphosis  ENT: No lesions,  mouth clear,  oropharynx  clear, no postnasal drip  Neck: No JVD, very soft inspiratory and expiratory stridor  Lungs: No use of accessory muscles, distant, no crackles or wheezing on normal respiration, no wheeze on forced expiration  Cardiovascular: RRR, heart sounds normal, no murmur or gallops, no peripheral edema  Musculoskeletal: No deformities, no cyanosis or clubbing  Neuro: alert, awake, non focal  Skin: Warm, no lesions or rash      Assessment & Plan:  COPD (chronic obstructive pulmonary disease) (HCC) Try taking your albuterol 2 puffs in the morning when you get up to see if this helps with your fatigue and breathing.  Keep it available to use at other times of your shortness of breath, chest tightness, wheezing. Continue your Anoro 1 inhalation once daily. If your breathing and fatigue do not improve we could consider changing your Anoro to an alternative. Follow with Dr Lamonte Sakai in 6 months or sooner if you have any problems  Pulmonary Mycobacterium avium infection CT chest overall stable.  She is never been treated for Mycobacterium.  Plan to continue clinical surveillance, repeat her CT chest in 1 year.  Baltazar Apo, MD, PhD 10/02/2021, 3:45 PM [facility] [phone] or if no answer [phone]

## 2021-10-02 NOTE — Assessment & Plan Note (Signed)
CT chest overall stable.  She is never been treated for Mycobacterium.  Plan to continue clinical surveillance, repeat her CT chest in 1 year.

## 2021-10-02 NOTE — Addendum Note (Signed)
Addended by: Lorretta Harp on: 10/02/2021 03:50 PM   Modules accepted: Orders

## 2021-10-02 NOTE — Assessment & Plan Note (Signed)
Try taking your albuterol 2 puffs in the morning when you get up to see if this helps with your fatigue and breathing.  Keep it available to use at other times of your shortness of breath, chest tightness, wheezing. Continue your Anoro 1 inhalation once daily. If your breathing and fatigue do not improve we could consider changing your Anoro to an alternative. Follow with Dr Lamonte Sakai in 6 months or sooner if you have any problems

## 2021-10-08 DIAGNOSIS — I10 Essential (primary) hypertension: Secondary | ICD-10-CM | POA: Diagnosis not present

## 2021-10-22 DIAGNOSIS — I10 Essential (primary) hypertension: Secondary | ICD-10-CM | POA: Diagnosis not present

## 2021-10-22 DIAGNOSIS — M8580 Other specified disorders of bone density and structure, unspecified site: Secondary | ICD-10-CM | POA: Diagnosis not present

## 2021-10-22 DIAGNOSIS — J449 Chronic obstructive pulmonary disease, unspecified: Secondary | ICD-10-CM | POA: Diagnosis not present

## 2021-10-22 DIAGNOSIS — K219 Gastro-esophageal reflux disease without esophagitis: Secondary | ICD-10-CM | POA: Diagnosis not present

## 2021-10-22 DIAGNOSIS — E7800 Pure hypercholesterolemia, unspecified: Secondary | ICD-10-CM | POA: Diagnosis not present

## 2021-10-22 DIAGNOSIS — M19039 Primary osteoarthritis, unspecified wrist: Secondary | ICD-10-CM | POA: Diagnosis not present

## 2021-10-22 DIAGNOSIS — I25810 Atherosclerosis of coronary artery bypass graft(s) without angina pectoris: Secondary | ICD-10-CM | POA: Diagnosis not present

## 2021-10-22 DIAGNOSIS — Z853 Personal history of malignant neoplasm of breast: Secondary | ICD-10-CM | POA: Diagnosis not present

## 2021-10-24 ENCOUNTER — Ambulatory Visit: Payer: PPO | Admitting: Internal Medicine

## 2021-10-24 ENCOUNTER — Other Ambulatory Visit: Payer: Self-pay

## 2021-10-24 ENCOUNTER — Encounter: Payer: Self-pay | Admitting: Internal Medicine

## 2021-10-24 VITALS — BP 114/72 | HR 76 | Ht 65.5 in | Wt 135.0 lb

## 2021-10-24 DIAGNOSIS — R0989 Other specified symptoms and signs involving the circulatory and respiratory systems: Secondary | ICD-10-CM

## 2021-10-24 DIAGNOSIS — I2510 Atherosclerotic heart disease of native coronary artery without angina pectoris: Secondary | ICD-10-CM

## 2021-10-24 NOTE — Patient Instructions (Signed)
Medication Instructions:  - Your physician recommends that you continue on your current medications as directed. Please refer to the Current Medication list given to you today.  *If you need a refill on your cardiac medications before your next appointment, please call your pharmacy*   Lab Work: - none ordered  If you have labs (blood work) drawn today and your tests are completely normal, you will receive your results only by: [place] (if you have MyChart) OR A paper copy in the mail If you have any lab test that is abnormal or we need to change your treatment, we will call you to review the results.   Testing/Procedures: - none ordered   Follow-Up: At [facility], you and your health needs are our priority.  As part of our continuing mission to provide you with exceptional heart care, we have created designated Provider Care Teams.  These Care Teams include your primary Cardiologist (physician) and Advanced Practice Providers (APPs -  Physician Assistants and Nurse Practitioners) who all work together to provide you with the care you need, when you need it.  We recommend signing up for the patient portal called "MyChart".  Sign up information is provided on this After Visit Summary.  MyChart is used to connect with patients for Virtual Visits (Telemedicine).  Patients are able to view lab/test results, encounter notes, upcoming appointments, etc.  Non-urgent messages can be sent to your provider as well.   To learn more about what you can do with MyChart, go to [email or web address].    Your next appointment:   6 month(s)  The format for your next appointment:   In Person  Provider:   You may see [name], MD or one of the following Advanced Practice Providers on your designated Care Team:   Murray Hodgkins, NP [name], PA-C Cadence Kathlen Mody, [place]    Other Instructions N/a

## 2021-10-24 NOTE — Progress Notes (Signed)
Follow-up Outpatient Visit Date: 10/24/2021  Primary Care Provider: Lavone Orn, MD [place] [address] [place] [address]  Chief Complaint: Follow-up coronary artery disease and hypertension  HPI:  Tiffany Caldwell is a 81 y.o. female with history of coronary artery disease status post remote PCI and subsequent emergent CABG for iatrogenic LMCA/LAD dissection in 2001, hypertension, hyperlipidemia, and right lower lobe cavitary lung mass/MAI, who presents for follow-up of coronary artery disease.  I last saw her in October, at which time Tiffany Caldwell was feeling better after stopping amlodipine due to "extreme fatigue" and leg swelling.  She was without chest pain and shortness of breath.  Blood pressures were somewhat labile but mildly elevated.  We agreed to increase valsartan-HCTZ to 160-12.5 mg daily.  This was subsequently changed to separate pills containing hydrochlorothiazide and telmisartan by Dr. Laurann Montana.  Today, Tiffany Caldwell reports that she has been feeling fairly well.  She still notes labile blood pressures at home, sometimes up to 562 mmHg systolic in the evenings.  She notes 1 episode of hypotension about 6 weeks ago accompanied by weakness/fatigue.  Her blood pressure was around 80/50 at the time.  She wonders if not drinking as much fluid may have contributed to this.  She denies chest pain, palpitations, lightheadedness, and edema.  Chronic exertional dyspnea is stable.  --------------------------------------------------------------------------------------------------  Cardiovascular History & Procedures: Cardiovascular Problems: Coronary artery disease status post PCI and subsequent CABG (iatrogenic LMCA dissection)   Risk Factors: Known coronary artery disease, hypertension, hyperlipidemia, and age   Cath/PCI: LHC/PCI (12/16/99): LMCA normal. LAD with 80 and 90% mid vessel stenosis. LCx without significant disease. RCA with 30% proximal stenosis. Successful PCI to  mid LAD with a NIR [place] 3.0 x 15 mm bare-metal stent. LHC/PCI (05/28/00): LMCA normal. LAD with 80-90% mid vessel in-stent restenosis. LCx normal. RCA with 30-40% proximal as well as ostial spasm. Plan angioplasty of previous stent complicated by proximal LAD dissection with placement of NIR Elite 3.5 x 12 mm bare-metal stent. Further propagation of the dissection to the LMCA occurred, prompting referral for urgent CABG.   CV Surgery: Urgent CABG (05/28/00): LIMA to LAD, SVG to diagonal, SVG to OM.   EP Procedures and Devices: None   Non-Invasive Evaluation(s): TTE (07/09/2021): Normal LV size with borderline LVH.  LVEF 65-70% with normal wall motion and grade 1 diastolic dysfunction.  Normal RV size and function.  Normal PA pressure.  Mild left atrial enlargement.  Trivial mitral regurgitation.  Mild aortic and tricuspid regurgitation.  Normal CVP. Pharmacologic MPI (08/05/2019): Low risk study without ischemia or scar.  LVEF greater than 65%.  Aortic atherosclerosis and coronary artery calcification noted. TTE (11/08/14): Normal LV size with mild concentric LVH. LVEF 55-60% without regional wall motion abnormalities. Grade 1 diastolic dysfunction. Left atrium is severely dilated. RV size is normal with normal contraction. Mild TR. Otherwise no significant valvular abnormalities.  Recent CV Pertinent Labs: Lab Results  Component Value Date   CHOL 155 12/08/2017   HDL 72 12/08/2017   LDLCALC 67 12/08/2017   TRIG 79 12/08/2017   CHOLHDL 2.2 12/08/2017   INR 1.06 11/20/2016   K 4.8 08/06/2021   MG 2.1 06/05/2021   BUN 29 (H) 08/06/2021   CREATININE 1.16 (H) 08/06/2021    Past medical and surgical history were reviewed and updated in EPIC.  Current Meds  Medication Sig   acetaminophen (TYLENOL) 500 MG tablet Take 1,000 mg by mouth 2 (two) times daily.    albuterol (  PROAIR HFA) 108 (90 Base) MCG/ACT inhaler Inhale 1-2 puffs into the lungs every 6 (six) hours as needed for wheezing or  shortness of breath.   ANORO ELLIPTA 62.5-25 MCG/ACT AEPB TAKE 1 PUFF BY MOUTH EVERY DAY   aspirin EC 81 MG tablet Take 81 mg by mouth daily.   atorvastatin (LIPITOR) 20 MG tablet TAKE ONE TABLET BY MOUTH ONCE DAILY   Biotin 10000 MCG TABS Take 1 tablet by mouth daily.   Calcium Carbonate-Vitamin D (CALCIUM 600 + D PO) Take 1 tablet by mouth daily.    fexofenadine (ALLEGRA) 180 MG tablet Take 180 mg by mouth daily.   fish oil-omega-3 fatty acids 1000 MG capsule Take 1 g by mouth daily.    gabapentin (NEURONTIN) 300 MG capsule Take 300 mg by mouth 4 (four) times daily.    Glucosamine-Chondroit-Vit C-Mn (GLUCOSAMINE CHONDR 1500 COMPLX PO) Take 1 tablet by mouth 2 (two) times daily.   hyoscyamine (LEVBID) 0.375 MG 12 hr tablet Take 0.375 mg by mouth every 12 (twelve) hours as needed for cramping (IBS).    Multiple Vitamin (MULTIVITAMIN) tablet Take 1 tablet by mouth daily.     nitroGLYCERIN (NITROSTAT) 0.4 MG SL tablet Place 0.4 mg under the tongue every 5 (five) minutes as needed for chest pain.    Allergies: Amlodipine, Hydromorphone, Azithromycin, Ciprofloxacin, Hydromorphone hcl, Levofloxacin, and Lisinopril  Social History   Tobacco Use   Smoking status: Former    Years: 30.00    Types: Cigarettes    Quit date: 07/31/1989    Years since quitting: 32.2   Smokeless tobacco: Never  Vaping Use   Vaping Use: Never used  Substance Use Topics   Alcohol use: Yes    Alcohol/week: 1.0 standard drink    Types: 1 Glasses of wine per week    Comment: occasional wine   Drug use: No    Family History  Problem Relation Age of Onset   Emphysema Father        deceased   Heart disease Mother        deceased   Colon cancer Paternal Grandmother    Colon cancer Son    Colon cancer Paternal Uncle     Review of Systems: A 12-system review of systems was performed and was negative except as noted in the  HPI.  --------------------------------------------------------------------------------------------------  Physical Exam: BP 114/72 (BP Location: Left Arm, Patient Position: Sitting, Cuff Size: Normal)    Pulse 76    Ht 5' 5.5" (1.664 m)    Wt 135 lb (61.2 kg)    LMP  (LMP Unknown)    SpO2 96%    BMI 22.12 kg/m   General:  NAD. Neck: No JVD or HJR. Lungs: Clear to auscultation bilaterally without wheezes or crackles. Heart: Regular rate and rhythm without murmurs, rubs, or gallops. Abdomen: Soft, nontender, nondistended. Extremities: No lower extremity edema.   Lab Results  Component Value Date   WBC 4.0 08/04/2019   HGB 12.2 08/04/2019   HCT 37.8 08/04/2019   MCV 103.0 (H) 08/04/2019   PLT 137 (L) 08/04/2019    Lab Results  Component Value Date   NA 141 08/06/2021   K 4.8 08/06/2021   CL 103 08/06/2021   CO2 26 08/06/2021   BUN 29 (H) 08/06/2021   CREATININE 1.16 (H) 08/06/2021   GLUCOSE 89 08/06/2021   ALT 25 08/04/2019    Lab Results  Component Value Date   CHOL 155 12/08/2017   HDL 72 12/08/2017  LDLCALC 67 12/08/2017   TRIG 79 12/08/2017   CHOLHDL 2.2 12/08/2017    --------------------------------------------------------------------------------------------------  ASSESSMENT AND PLAN: Labile hypertension: Blood pressure normal today.  Home readings are still somewhat labile but seem to be improved with medication changes including transition to separate HCTZ and telmisartan pills.  We will defer medication changes today.  Coronary artery disease: No angina reported.  Continue current medications for secondary prevention.  Ongoing management of hyperlipidemia per Dr. Laurann Montana.  Follow-up: Return to clinic in 6 months.  [name], MD 10/24/2021 2:45 PM

## 2021-10-29 DIAGNOSIS — C44329 Squamous cell carcinoma of skin of other parts of face: Secondary | ICD-10-CM | POA: Diagnosis not present

## 2021-10-29 DIAGNOSIS — Z85828 Personal history of other malignant neoplasm of skin: Secondary | ICD-10-CM | POA: Diagnosis not present

## 2021-11-27 DIAGNOSIS — E7800 Pure hypercholesterolemia, unspecified: Secondary | ICD-10-CM | POA: Diagnosis not present

## 2021-11-27 DIAGNOSIS — I10 Essential (primary) hypertension: Secondary | ICD-10-CM | POA: Diagnosis not present

## 2021-11-27 DIAGNOSIS — J449 Chronic obstructive pulmonary disease, unspecified: Secondary | ICD-10-CM | POA: Diagnosis not present

## 2021-12-12 ENCOUNTER — Other Ambulatory Visit: Payer: Self-pay | Admitting: Internal Medicine

## 2021-12-18 ENCOUNTER — Other Ambulatory Visit: Payer: Self-pay | Admitting: Internal Medicine

## 2021-12-19 DIAGNOSIS — K219 Gastro-esophageal reflux disease without esophagitis: Secondary | ICD-10-CM | POA: Diagnosis not present

## 2021-12-19 DIAGNOSIS — I10 Essential (primary) hypertension: Secondary | ICD-10-CM | POA: Diagnosis not present

## 2021-12-19 DIAGNOSIS — E7800 Pure hypercholesterolemia, unspecified: Secondary | ICD-10-CM | POA: Diagnosis not present

## 2021-12-23 ENCOUNTER — Other Ambulatory Visit: Payer: Self-pay | Admitting: Internal Medicine

## 2022-01-17 DIAGNOSIS — J449 Chronic obstructive pulmonary disease, unspecified: Secondary | ICD-10-CM | POA: Diagnosis not present

## 2022-01-17 DIAGNOSIS — E7800 Pure hypercholesterolemia, unspecified: Secondary | ICD-10-CM | POA: Diagnosis not present

## 2022-01-17 DIAGNOSIS — N1831 Chronic kidney disease, stage 3a: Secondary | ICD-10-CM | POA: Diagnosis not present

## 2022-01-17 DIAGNOSIS — I10 Essential (primary) hypertension: Secondary | ICD-10-CM | POA: Diagnosis not present

## 2022-01-21 DIAGNOSIS — I700 Atherosclerosis of aorta: Secondary | ICD-10-CM | POA: Diagnosis not present

## 2022-01-21 DIAGNOSIS — N1831 Chronic kidney disease, stage 3a: Secondary | ICD-10-CM | POA: Diagnosis not present

## 2022-01-21 DIAGNOSIS — E210 Primary hyperparathyroidism: Secondary | ICD-10-CM | POA: Diagnosis not present

## 2022-01-21 DIAGNOSIS — I129 Hypertensive chronic kidney disease with stage 1 through stage 4 chronic kidney disease, or unspecified chronic kidney disease: Secondary | ICD-10-CM | POA: Diagnosis not present

## 2022-01-22 ENCOUNTER — Other Ambulatory Visit: Payer: Self-pay | Admitting: Internal Medicine

## 2022-01-22 DIAGNOSIS — N1831 Chronic kidney disease, stage 3a: Secondary | ICD-10-CM

## 2022-01-23 ENCOUNTER — Ambulatory Visit
Admission: RE | Admit: 2022-01-23 | Discharge: 2022-01-23 | Disposition: A | Discharge status: Home / Self Care | Payer: PPO | Source: Ambulatory Visit | Attending: Internal Medicine | Admitting: Internal Medicine

## 2022-01-23 DIAGNOSIS — N261 Atrophy of kidney (terminal): Secondary | ICD-10-CM | POA: Diagnosis not present

## 2022-01-23 DIAGNOSIS — N189 Chronic kidney disease, unspecified: Secondary | ICD-10-CM | POA: Diagnosis not present

## 2022-01-23 DIAGNOSIS — N1831 Chronic kidney disease, stage 3a: Secondary | ICD-10-CM

## 2022-01-30 DIAGNOSIS — E875 Hyperkalemia: Secondary | ICD-10-CM | POA: Diagnosis not present

## 2022-02-05 DIAGNOSIS — M546 Pain in thoracic spine: Secondary | ICD-10-CM | POA: Diagnosis not present

## 2022-02-13 DIAGNOSIS — I10 Essential (primary) hypertension: Secondary | ICD-10-CM | POA: Diagnosis not present

## 2022-02-13 DIAGNOSIS — K219 Gastro-esophageal reflux disease without esophagitis: Secondary | ICD-10-CM | POA: Diagnosis not present

## 2022-02-13 DIAGNOSIS — E7800 Pure hypercholesterolemia, unspecified: Secondary | ICD-10-CM | POA: Diagnosis not present

## 2022-02-13 DIAGNOSIS — M8580 Other specified disorders of bone density and structure, unspecified site: Secondary | ICD-10-CM | POA: Diagnosis not present

## 2022-02-13 DIAGNOSIS — J449 Chronic obstructive pulmonary disease, unspecified: Secondary | ICD-10-CM | POA: Diagnosis not present

## 2022-02-28 DIAGNOSIS — N1831 Chronic kidney disease, stage 3a: Secondary | ICD-10-CM | POA: Diagnosis not present

## 2022-02-28 DIAGNOSIS — I129 Hypertensive chronic kidney disease with stage 1 through stage 4 chronic kidney disease, or unspecified chronic kidney disease: Secondary | ICD-10-CM | POA: Diagnosis not present

## 2022-03-17 DIAGNOSIS — E210 Primary hyperparathyroidism: Secondary | ICD-10-CM | POA: Diagnosis not present

## 2022-03-17 DIAGNOSIS — N1831 Chronic kidney disease, stage 3a: Secondary | ICD-10-CM | POA: Diagnosis not present

## 2022-03-19 DIAGNOSIS — K219 Gastro-esophageal reflux disease without esophagitis: Secondary | ICD-10-CM | POA: Diagnosis not present

## 2022-03-19 DIAGNOSIS — M8580 Other specified disorders of bone density and structure, unspecified site: Secondary | ICD-10-CM | POA: Diagnosis not present

## 2022-03-19 DIAGNOSIS — I25810 Atherosclerosis of coronary artery bypass graft(s) without angina pectoris: Secondary | ICD-10-CM | POA: Diagnosis not present

## 2022-03-19 DIAGNOSIS — E7800 Pure hypercholesterolemia, unspecified: Secondary | ICD-10-CM | POA: Diagnosis not present

## 2022-03-19 DIAGNOSIS — J449 Chronic obstructive pulmonary disease, unspecified: Secondary | ICD-10-CM | POA: Diagnosis not present

## 2022-03-19 DIAGNOSIS — I10 Essential (primary) hypertension: Secondary | ICD-10-CM | POA: Diagnosis not present

## 2022-03-20 DIAGNOSIS — M8580 Other specified disorders of bone density and structure, unspecified site: Secondary | ICD-10-CM | POA: Diagnosis not present

## 2022-03-20 DIAGNOSIS — N183 Chronic kidney disease, stage 3 unspecified: Secondary | ICD-10-CM | POA: Diagnosis not present

## 2022-03-20 DIAGNOSIS — E210 Primary hyperparathyroidism: Secondary | ICD-10-CM | POA: Diagnosis not present

## 2022-03-20 DIAGNOSIS — Z853 Personal history of malignant neoplasm of breast: Secondary | ICD-10-CM | POA: Diagnosis not present

## 2022-03-20 DIAGNOSIS — I10 Essential (primary) hypertension: Secondary | ICD-10-CM | POA: Diagnosis not present

## 2022-03-20 DIAGNOSIS — I2510 Atherosclerotic heart disease of native coronary artery without angina pectoris: Secondary | ICD-10-CM | POA: Diagnosis not present

## 2022-03-20 DIAGNOSIS — A312 Disseminated mycobacterium avium-intracellulare complex (DMAC): Secondary | ICD-10-CM | POA: Diagnosis not present

## 2022-03-20 DIAGNOSIS — Z8781 Personal history of (healed) traumatic fracture: Secondary | ICD-10-CM | POA: Diagnosis not present

## 2022-03-27 ENCOUNTER — Other Ambulatory Visit (HOSPITAL_COMMUNITY): Payer: Self-pay | Admitting: Internal Medicine

## 2022-03-27 ENCOUNTER — Other Ambulatory Visit: Payer: Self-pay | Admitting: Internal Medicine

## 2022-03-27 DIAGNOSIS — E213 Hyperparathyroidism, unspecified: Secondary | ICD-10-CM

## 2022-04-13 ENCOUNTER — Other Ambulatory Visit: Payer: Self-pay | Admitting: Emergency Medicine

## 2022-04-15 DIAGNOSIS — E7800 Pure hypercholesterolemia, unspecified: Secondary | ICD-10-CM | POA: Diagnosis not present

## 2022-04-15 DIAGNOSIS — K219 Gastro-esophageal reflux disease without esophagitis: Secondary | ICD-10-CM | POA: Diagnosis not present

## 2022-04-15 DIAGNOSIS — J449 Chronic obstructive pulmonary disease, unspecified: Secondary | ICD-10-CM | POA: Diagnosis not present

## 2022-04-15 DIAGNOSIS — I10 Essential (primary) hypertension: Secondary | ICD-10-CM | POA: Diagnosis not present

## 2022-04-22 ENCOUNTER — Encounter (HOSPITAL_COMMUNITY)
Admission: RE | Admit: 2022-04-22 | Discharge: 2022-04-22 | Disposition: A | Discharge status: Home / Self Care | Payer: PPO | Source: Ambulatory Visit | Attending: Internal Medicine | Admitting: Internal Medicine

## 2022-04-22 DIAGNOSIS — E213 Hyperparathyroidism, unspecified: Secondary | ICD-10-CM | POA: Insufficient documentation

## 2022-04-22 MED ORDER — TECHNETIUM TC 99M SESTAMIBI GENERIC - CARDIOLITE
25.7000 | Freq: Once | INTRAVENOUS | Status: AC | PRN
  Administered 2022-04-22: 25.7 via INTRAVENOUS

## 2022-05-08 NOTE — Progress Notes (Signed)
Cardiology Office Note    Date:  05/09/2022   ID:  Yael, Coppess 05-10-1941, MRN [id number]  PCP:  Lavone Orn, MD  Cardiologist:  [name], MD  Electrophysiologist:  None   Chief Complaint: Follow-up  History of Present Illness:   Tiffany Caldwell is a 81 y.o. female with history of  CAD status post remote PCI with subsequent emergent 3-vessel CABG for iatrogenic left main/LAD dissection in 2001, right lower lobe cavitary lung mass, labile HTN, HLD, left-sided breast cancer, DDD, COPD, IBS, and GERD who presents for follow-up of CAD and labile hypertension.   Prior left heart cath in 12/1999 showed left main normal, LAD with 80 and 90% mid vessel stenosis, LCx without significant disease, RCA with 30% proximal stenosis.  She underwent successful PCI/BMS to the mid LAD.  Repeat cath on 05/28/2000 showed left main normal, LAD with 80 to 90% mid vessel in-stent restenosis, LCx normal, RCA with 30 to 40% proximal stenosis as well as ostial spasm.  Plan was for angioplasty of the previously placed stent though procedure was complicated by proximal LAD dissection with placement of BMS.  Further propagation of the dissection to the left main occurred prompting referral for urgent CABG.  Patient underwent three-vessel CABG with LIMA to LAD, SVG to diagonal, and SVG to OM.  Echo from 11/2014 showed an EF of 55 to 60% without regional wall motion abnormalities, grade 1 diastolic dysfunction, mild concentric LVH, severely dilated left atrium, normal RV size and systolic function, mild tricuspid regurgitation, otherwise no significant valvular abnormalities.  She underwent Lexiscan MPI in 07/2019 after being evaluated in the ED and ruling out with study showing no significant ischemia and was overall low risk.  She underwent echo in 07/2021 due to a slight progression of chronic exertional dyspnea which demonstrated an EF of 65 to 70%, no regional wall motion abnormalities, grade 1 diastolic  dysfunction, normal RV systolic function and ventricular cavity size, normal PASP, mildly dilated left atrium, trivial mitral regurgitation, mild aortic insufficiency, and an estimated right atrial pressure of 3 mmHg.  She was taken off amlodipine in 2022 with noted improvement in fatigue and lower extremity swelling.  She was most recently seen in the office in 10/2021, and continued to note labile blood pressure readings at home, sometimes up to 532 mmHg systolic in the evenings.  She reported 1 episode of hypotension several weeks prior with her blood pressure being around 80/50 at that time.  Her chronic exertional dyspnea was stable.  Medication changes were deferred.   She comes in doing well from a cardiac perspective, and is without symptoms of angina or decompensation.  She reports well-controlled blood pressures at home, though is not checking them as frequently as she previously did.  No significant episodes of hypertension or hypotension with HCTZ in the morning and telmisartan in the evening.  No falls or symptoms concerning for bleeding.  No orthopnea or lower extremity swelling.  No dizziness, presyncope, or syncope.  She does note some fatigue and shoulder soreness if she is sitting or ambulating for prolonged timeframes.  Overall, she feels like she is doing well.   Labs independently reviewed: 08/2021 - BUN 29, serum creatinine 1.16, potassium 4.8 05/2021 - magnesium 2.1 07/2019 - albumin 3.9, AST/ALT normal 12/2017 - TC 155, TG 79, HDL 72, LDL 67  Past Medical History:  Diagnosis Date   Allergy    Arthritis    Breast cancer ([place])    left breast  Coronary artery disease 2001   CABG   DDD (degenerative disc disease), lumbosacral    Dyspnea    if rushing or climmbing lots of stairs   Emphysema lung (HCC)    emphysema   GERD (gastroesophageal reflux disease)    11/20/16- no a current problem   Hyperlipidemia    Hypertension    Irritable bowel syndrome (IBS)    Lung disorder     leison   Neuropathy    Pinched nerve    back and left leg   Pneumonia 2016    Past Surgical History:  Procedure Laterality Date   BREAST FIBROADENOMA SURGERY  01/13/1980   BREAST SURGERY  02/01/2004   tram/mastectomyfor recurrence   CARPOMETACARPEL SUSPENSION PLASTY Right 12/17/2017   Procedure: SUSPENSIONPLASTY RIGHT THUMB WITH EXCISION TRAPEZIUM;  Surgeon: Daryll Brod, MD;  Location: [facility];  Service: Orthopedics;  Laterality: Right;   COLONOSCOPY     CORONARY ANGIOPLASTY  2001   had tear to two vessels- had to have open heart surgery   CORONARY ARTERY BYPASS GRAFT  2001   EYE SURGERY Bilateral    Cataract   KNEE ARTHROSCOPY  January 2011   right   MASTECTOMY PARTIAL / LUMPECTOMY W/ AXILLARY LYMPHADENECTOMY  04/10/1989   Dr Annamaria Boots / left breast   PARTIAL HYSTERECTOMY     vaginal   TENDON TRANSFER Right 12/17/2017   Procedure: ABDUCTOR POLLICIS LONGUS TRANSFER;  Surgeon: Daryll Brod, MD;  Location: [facility];  Service: Orthopedics;  Laterality: Right;   TONSILLECTOMY AND ADENOIDECTOMY     TOTAL HIP ARTHROPLASTY Right 08/05/2016   Procedure: TOTAL HIP ARTHROPLASTY ANTERIOR APPROACH;  Surgeon: [name], MD;  Location: [facility];  Service: Orthopedics;  Laterality: Right;   UPPER GASTROINTESTINAL ENDOSCOPY     VIDEO BRONCHOSCOPY WITH ENDOBRONCHIAL NAVIGATION N/A 11/27/2016   Procedure: VIDEO BRONCHOSCOPY WITH ENDOBRONCHIAL NAVIGATION;  Surgeon: [name], MD;  Location: [facility];  Service: Thoracic;  Laterality: N/A;    Current Medications: Current Meds  Medication Sig   acetaminophen (TYLENOL) 500 MG tablet Take 1,000 mg by mouth 2 (two) times daily.    albuterol (PROAIR HFA) 108 (90 Base) MCG/ACT inhaler Inhale 1-2 puffs into the lungs every 6 (six) hours as needed for wheezing or shortness of breath.   ANORO ELLIPTA 62.5-25 MCG/ACT AEPB INHALE ONE PUFF BY MOUTH DAILY   aspirin EC 81 MG tablet Take 81 mg by mouth daily.    atorvastatin (LIPITOR) 20 MG tablet TAKE ONE TABLET BY MOUTH DAILY   Biotin 10000 MCG TABS Take 1 tablet by mouth daily.   Calcium Carbonate-Vitamin D (CALCIUM 600 + D PO) Take 1 tablet by mouth daily.    fexofenadine (ALLEGRA) 180 MG tablet Take 180 mg by mouth daily.   fish oil-omega-3 fatty acids 1000 MG capsule Take 1 g by mouth daily.    gabapentin (NEURONTIN) 300 MG capsule Take 300 mg by mouth 4 (four) times daily.    Glucosamine-Chondroit-Vit C-Mn (GLUCOSAMINE CHONDR 1500 COMPLX PO) Take 1 tablet by mouth 2 (two) times daily.   hydrochlorothiazide (HYDRODIURIL) 12.5 MG tablet Take 12.5 mg by mouth every morning.   hyoscyamine (LEVBID) 0.375 MG 12 hr tablet Take 0.375 mg by mouth every 12 (twelve) hours as needed for cramping (IBS).    Multiple Vitamin (MULTIVITAMIN) tablet Take 1 tablet by mouth daily.     nitroGLYCERIN (NITROSTAT) 0.4 MG SL tablet Place 0.4 mg under the tongue every 5 (five) minutes as needed for  chest pain.   telmisartan (MICARDIS) 40 MG tablet Take 40 mg by mouth daily.    Allergies:   Amlodipine, Hydromorphone, Azithromycin, Ciprofloxacin, Hydromorphone hcl, Levofloxacin, and Lisinopril   Social History   Socioeconomic History   Marital status: Married    Spouse name: Not on file   Number of children: Not on file   Years of education: Not on file   Highest education level: Not on file  Occupational History   Not on file  Tobacco Use   Smoking status: Former    Years: 30.00    Types: Cigarettes    Quit date: 07/31/1989    Years since quitting: 32.7   Smokeless tobacco: Never  Vaping Use   Vaping Use: Never used  Substance and Sexual Activity   Alcohol use: Yes    Alcohol/week: 1.0 standard drink of alcohol    Types: 1 Glasses of wine per week    Comment: occasional wine   Drug use: No   Sexual activity: Not on file  Other Topics Concern   Not on file  Social History Narrative   Not on file   Social Determinants of Health   Financial  Resource Strain: Not on file  Food Insecurity: Not on file  Transportation Needs: Not on file  Physical Activity: Not on file  Stress: Not on file  Social Connections: Not on file     Family History:  The patient's family history includes Colon cancer in her paternal grandmother, paternal uncle, and son; Emphysema in her father; Heart disease in her mother.  ROS:   12-point review of systems is negative unless otherwise noted in HPI.   EKGs/Labs/Other Studies Reviewed:    Studies reviewed were summarized above. The additional studies were reviewed today:  2D echo 07/09/2021: 1. Left ventricular ejection fraction, by estimation, is 65 to 70%. The  left ventricle has normal function. The left ventricle has no regional  wall motion abnormalities. Left ventricular diastolic parameters are  consistent with Grade I diastolic  dysfunction (impaired relaxation).   2. Right ventricular systolic function is normal. The right ventricular  size is normal. There is normal pulmonary artery systolic pressure.   3. Left atrial size was mildly dilated.   4. The mitral valve is normal in structure. Trivial mitral valve  regurgitation. No evidence of mitral stenosis.   5. The aortic valve is tricuspid. Aortic valve regurgitation is mild. No  aortic stenosis is present.   6. The inferior vena cava is normal in size with greater than 50%  respiratory variability, suggesting right atrial pressure of 3 mmHg.   Comparison(s): LVEF 55-60%. __________  Carlton Adam MPI 08/05/2019: Pharmacological myocardial perfusion imaging study with no significant  ischemia Normal wall motion, EF estimated at 83% No EKG changes concerning for ischemia at peak stress or in recovery. Resting EKG with T wave ABN in V3 to V6, II, III, AVF.  APCs and PVCs noted CT attenuation corrected images with aortic atherosclerosis, coronary calcification in the LAD Low risk scan __________  2D echo 11/08/2014: - Left ventricle:  The cavity size was normal. There was mild    concentric hypertrophy. Systolic function was normal. The    estimated ejection fraction was in the range of 55% to 60%. Wall    motion was normal; there were no regional wall motion    abnormalities. Doppler parameters are consistent with abnormal    left ventricular relaxation (grade 1 diastolic dysfunction). The    E/e&' ratio is between 8-15,  suggesting indeterminate LV filling    pressure.  - Aortic valve: Sclerosis without stenosis. Regurgitation pressure    half-time: 574 ms.  - Mitral valve: Mildly thickened leaflets . There was trivial    regurgitation.  - Left atrium: Massively dilated at 133 ml/m2.  - Atrial septum: A septal defect cannot be excluded.  - Tricuspid valve: There was mild regurgitation.  - Pulmonary arteries: PA peak pressure: 40 mm Hg (S).  - Inferior vena cava: The vessel was normal in size. The    respirophasic diameter changes were in the normal range (>= 50%),    consistent with normal central venous pressure.   Impressions:   - LVEF 55-60%, mild LVH, diastolic dysfunction, indeterminate LV    filling pressure, aortic sclerosis with trivial AI, mild TR, RVSP    40 mmHg, massive left atrial enlargment.   EKG:  EKG is ordered today.  The EKG ordered today demonstrates NSR, 70 bpm, LVH, poor R wave progression in the precordial leads, nonspecific lateral ST-T changes consistent with prior tracing  Recent Labs: 06/05/2021: Magnesium 2.1 08/06/2021: BUN 29; Creatinine, Ser 1.16; Potassium 4.8; Sodium 141  Recent Lipid Panel    Component Value Date/Time   CHOL 155 12/08/2017 1045   TRIG 79 12/08/2017 1045   HDL 72 12/08/2017 1045   CHOLHDL 2.2 12/08/2017 1045   LDLCALC 67 12/08/2017 1045    PHYSICAL EXAM:    VS:  BP (!) 148/70 (BP Location: Left Arm, Patient Position: Sitting, Cuff Size: Normal)   Pulse 70   Ht '5\' 5"'$  (1.651 m)   Wt 135 lb 6 oz (61.4 kg)   LMP  (LMP Unknown)   SpO2 94%   BMI 22.53  kg/m   BMI: Body mass index is 22.53 kg/m.  Physical Exam Constitutional:      Appearance: She is well-developed.  HENT:     Head: Normocephalic and atraumatic.  Eyes:     General:        Right eye: No discharge.        Left eye: No discharge.  Neck:     Comments: Kyphotic Cardiovascular:     Rate and Rhythm: Normal rate and regular rhythm.     Pulses:          Dorsalis pedis pulses are 2+ on the right side and 2+ on the left side.       Posterior tibial pulses are 2+ on the right side and 2+ on the left side.     Heart sounds: Normal heart sounds, S1 normal and S2 normal. Heart sounds not distant. No midsystolic click and no opening snap. No murmur heard.    No friction rub.  Pulmonary:     Effort: Pulmonary effort is normal. No respiratory distress.     Breath sounds: Normal breath sounds. No decreased breath sounds, wheezing or rales.  Chest:     Chest wall: No tenderness.  Abdominal:     General: There is no distension.  Musculoskeletal:     Cervical back: Normal range of motion.     Right lower leg: No edema.     Left lower leg: No edema.  Skin:    General: Skin is warm and dry.     Nails: There is no clubbing.  Neurological:     Mental Status: She is alert and oriented to person, place, and time.  Psychiatric:        Speech: Speech normal.        Behavior:  Behavior normal.        Thought Content: Thought content normal.        Judgment: Judgment normal.     Wt Readings from Last 3 Encounters:  05/09/22 135 lb 6 oz (61.4 kg)  10/24/21 135 lb (61.2 kg)  10/02/21 136 lb 6.4 oz (61.9 kg)     ASSESSMENT & PLAN:   CAD status post CABG without angina: She is doing well without symptoms concerning for angina.  Continue secondary prevention and aggressive risk factor modification including aspirin, atorvastatin, and telmisartan.  No indication for further ischemic testing at this time.  Labile hypertension: Blood pressure has remained well controlled at home  without significant elevations or episodes of hypotension.  She remains on HCTZ in the morning and telmisartan in the evening.  Upon PCP retirement, she would like to transition her hypertension management over to our office.  HLD: LDL 67.  She remains on atorvastatin 20 mg daily.  She will be transitioning her lipid management to our office upon the retirement of her primary care physician.    Disposition: F/u with Dr. Saunders Revel or an APP in 6 months.   Medication Adjustments/Labs and Tests Ordered: Current medicines are reviewed at length with the patient today.  Concerns regarding medicines are outlined above. Medication changes, Labs and Tests ordered today are summarized above and listed in the Patient Instructions accessible in Encounters.   Signed, [name], PA-C 05/09/2022 2:41 PM     [address] [place], [place] [address] [phone]

## 2022-05-09 ENCOUNTER — Encounter: Payer: Self-pay | Admitting: Physician Assistant

## 2022-05-09 ENCOUNTER — Ambulatory Visit (INDEPENDENT_AMBULATORY_CARE_PROVIDER_SITE_OTHER): Payer: PPO | Admitting: Physician Assistant

## 2022-05-09 VITALS — BP 148/70 | HR 70 | Ht 65.0 in | Wt 135.4 lb

## 2022-05-09 DIAGNOSIS — E785 Hyperlipidemia, unspecified: Secondary | ICD-10-CM | POA: Diagnosis not present

## 2022-05-09 DIAGNOSIS — I2510 Atherosclerotic heart disease of native coronary artery without angina pectoris: Secondary | ICD-10-CM | POA: Diagnosis not present

## 2022-05-09 DIAGNOSIS — Z951 Presence of aortocoronary bypass graft: Secondary | ICD-10-CM | POA: Diagnosis not present

## 2022-05-09 DIAGNOSIS — R0989 Other specified symptoms and signs involving the circulatory and respiratory systems: Secondary | ICD-10-CM | POA: Diagnosis not present

## 2022-05-09 NOTE — Patient Instructions (Signed)
Medication Instructions:  No changes at this time.   *If you need a refill on your cardiac medications before your next appointment, please call your pharmacy*   Lab Work: None  If you have labs (blood work) drawn today and your tests are completely normal, you will receive your results only by: MyChart Message (if you have MyChart) OR A paper copy in the mail If you have any lab test that is abnormal or we need to change your treatment, we will call you to review the results.   Testing/Procedures: None   Follow-Up: At [facility], you and your health needs are our priority.  As part of our continuing mission to provide you with exceptional heart care, we have created designated Provider Care Teams.  These Care Teams include your primary Cardiologist (physician) and Advanced Practice Providers (APPs -  Physician Assistants and Nurse Practitioners) who all work together to provide you with the care you need, when you need it.   Your next appointment:   6 month(s)  The format for your next appointment:   In Person  Provider:   Christopher End, MD or Ryan Dunn, PA-C        Important Information About Sugar       

## 2022-05-16 DIAGNOSIS — E7800 Pure hypercholesterolemia, unspecified: Secondary | ICD-10-CM | POA: Diagnosis not present

## 2022-05-16 DIAGNOSIS — I10 Essential (primary) hypertension: Secondary | ICD-10-CM | POA: Diagnosis not present

## 2022-05-16 DIAGNOSIS — J449 Chronic obstructive pulmonary disease, unspecified: Secondary | ICD-10-CM | POA: Diagnosis not present

## 2022-05-16 DIAGNOSIS — K219 Gastro-esophageal reflux disease without esophagitis: Secondary | ICD-10-CM | POA: Diagnosis not present

## 2022-05-21 DIAGNOSIS — A310 Pulmonary mycobacterial infection: Secondary | ICD-10-CM | POA: Diagnosis not present

## 2022-05-21 DIAGNOSIS — I10 Essential (primary) hypertension: Secondary | ICD-10-CM | POA: Diagnosis not present

## 2022-05-21 DIAGNOSIS — M40204 Unspecified kyphosis, thoracic region: Secondary | ICD-10-CM | POA: Diagnosis not present

## 2022-05-21 DIAGNOSIS — E210 Primary hyperparathyroidism: Secondary | ICD-10-CM | POA: Diagnosis not present

## 2022-05-21 DIAGNOSIS — N1831 Chronic kidney disease, stage 3a: Secondary | ICD-10-CM | POA: Diagnosis not present

## 2022-06-04 DIAGNOSIS — D485 Neoplasm of uncertain behavior of skin: Secondary | ICD-10-CM | POA: Diagnosis not present

## 2022-06-10 ENCOUNTER — Other Ambulatory Visit: Payer: Self-pay | Admitting: Internal Medicine

## 2022-06-13 ENCOUNTER — Telehealth: Payer: Self-pay | Admitting: Emergency Medicine

## 2022-06-13 DIAGNOSIS — K219 Gastro-esophageal reflux disease without esophagitis: Secondary | ICD-10-CM | POA: Diagnosis not present

## 2022-06-13 DIAGNOSIS — E7800 Pure hypercholesterolemia, unspecified: Secondary | ICD-10-CM | POA: Diagnosis not present

## 2022-06-13 DIAGNOSIS — I10 Essential (primary) hypertension: Secondary | ICD-10-CM | POA: Diagnosis not present

## 2022-06-13 DIAGNOSIS — I25810 Atherosclerosis of coronary artery bypass graft(s) without angina pectoris: Secondary | ICD-10-CM | POA: Diagnosis not present

## 2022-06-13 DIAGNOSIS — N1831 Chronic kidney disease, stage 3a: Secondary | ICD-10-CM | POA: Diagnosis not present

## 2022-06-13 MED ORDER — ALBUTEROL SULFATE HFA 108 (90 BASE) MCG/ACT IN AERS: 1.0000 | INHALATION_SPRAY | Freq: Four times a day (QID) | RESPIRATORY_TRACT | 3 refills | Status: DC | PRN

## 2022-06-13 NOTE — Telephone Encounter (Signed)
Proair refill sent to [facility]. Nothing further at this time.

## 2022-06-24 DIAGNOSIS — M8589 Other specified disorders of bone density and structure, multiple sites: Secondary | ICD-10-CM | POA: Diagnosis not present

## 2022-06-24 DIAGNOSIS — Z780 Asymptomatic menopausal state: Secondary | ICD-10-CM | POA: Diagnosis not present

## 2022-06-24 DIAGNOSIS — Z1231 Encounter for screening mammogram for malignant neoplasm of breast: Secondary | ICD-10-CM | POA: Diagnosis not present

## 2022-07-07 ENCOUNTER — Other Ambulatory Visit: Payer: Self-pay | Admitting: Plastic Surgery

## 2022-07-07 DIAGNOSIS — C44329 Squamous cell carcinoma of skin of other parts of face: Secondary | ICD-10-CM | POA: Diagnosis not present

## 2022-07-07 DIAGNOSIS — D485 Neoplasm of uncertain behavior of skin: Secondary | ICD-10-CM | POA: Diagnosis not present

## 2022-07-08 ENCOUNTER — Ambulatory Visit
Admission: RE | Admit: 2022-07-08 | Discharge: 2022-07-08 | Disposition: A | Discharge status: Home / Self Care | Payer: PPO | Source: Ambulatory Visit | Attending: Emergency Medicine | Admitting: Emergency Medicine

## 2022-07-08 DIAGNOSIS — Z951 Presence of aortocoronary bypass graft: Secondary | ICD-10-CM | POA: Diagnosis not present

## 2022-07-08 DIAGNOSIS — Z923 Personal history of irradiation: Secondary | ICD-10-CM | POA: Diagnosis not present

## 2022-07-08 DIAGNOSIS — J439 Emphysema, unspecified: Secondary | ICD-10-CM | POA: Diagnosis not present

## 2022-07-08 DIAGNOSIS — R911 Solitary pulmonary nodule: Secondary | ICD-10-CM | POA: Insufficient documentation

## 2022-07-08 DIAGNOSIS — Z9010 Acquired absence of unspecified breast and nipple: Secondary | ICD-10-CM | POA: Insufficient documentation

## 2022-07-08 DIAGNOSIS — R918 Other nonspecific abnormal finding of lung field: Secondary | ICD-10-CM | POA: Diagnosis not present

## 2022-07-08 DIAGNOSIS — I517 Cardiomegaly: Secondary | ICD-10-CM | POA: Diagnosis not present

## 2022-07-08 DIAGNOSIS — Z955 Presence of coronary angioplasty implant and graft: Secondary | ICD-10-CM | POA: Diagnosis not present

## 2022-07-08 DIAGNOSIS — Z853 Personal history of malignant neoplasm of breast: Secondary | ICD-10-CM | POA: Insufficient documentation

## 2022-07-08 DIAGNOSIS — J479 Bronchiectasis, uncomplicated: Secondary | ICD-10-CM | POA: Diagnosis not present

## 2022-07-08 DIAGNOSIS — I700 Atherosclerosis of aorta: Secondary | ICD-10-CM | POA: Insufficient documentation

## 2022-07-08 DIAGNOSIS — I2510 Atherosclerotic heart disease of native coronary artery without angina pectoris: Secondary | ICD-10-CM | POA: Insufficient documentation

## 2022-07-08 DIAGNOSIS — J984 Other disorders of lung: Secondary | ICD-10-CM | POA: Insufficient documentation

## 2022-07-08 DIAGNOSIS — J449 Chronic obstructive pulmonary disease, unspecified: Secondary | ICD-10-CM

## 2022-07-08 DIAGNOSIS — Z87891 Personal history of nicotine dependence: Secondary | ICD-10-CM | POA: Diagnosis not present

## 2022-07-08 DIAGNOSIS — J432 Centrilobular emphysema: Secondary | ICD-10-CM | POA: Insufficient documentation

## 2022-07-15 ENCOUNTER — Ambulatory Visit: Payer: PPO | Admitting: Emergency Medicine

## 2022-07-15 ENCOUNTER — Encounter: Payer: Self-pay | Admitting: Emergency Medicine

## 2022-07-15 DIAGNOSIS — A310 Pulmonary mycobacterial infection: Secondary | ICD-10-CM

## 2022-07-15 DIAGNOSIS — R0609 Other forms of dyspnea: Secondary | ICD-10-CM | POA: Diagnosis not present

## 2022-07-15 DIAGNOSIS — J449 Chronic obstructive pulmonary disease, unspecified: Secondary | ICD-10-CM

## 2022-07-15 LAB — CBC WITH DIFFERENTIAL/PLATELET
Basophils Absolute: 0 10*3/uL (ref 0.0–0.1)
Basophils Relative: 0.4 % (ref 0.0–3.0)
Eosinophils Absolute: 0 10*3/uL (ref 0.0–0.7)
Eosinophils Relative: 0.5 % (ref 0.0–5.0)
HCT: 39.7 % (ref 36.0–46.0)
Hemoglobin: 13.4 g/dL (ref 12.0–15.0)
Lymphocytes Relative: 26.9 % (ref 12.0–46.0)
Lymphs Abs: 1.2 10*3/uL (ref 0.7–4.0)
MCHC: 33.6 g/dL (ref 30.0–36.0)
MCV: 99.4 fl (ref 78.0–100.0)
Monocytes Absolute: 0.5 10*3/uL (ref 0.1–1.0)
Monocytes Relative: 11.1 % (ref 3.0–12.0)
Neutro Abs: 2.7 10*3/uL (ref 1.4–7.7)
Neutrophils Relative %: 61.1 % (ref 43.0–77.0)
Platelets: 134 10*3/uL — ABNORMAL LOW (ref 150.0–400.0)
RBC: 4 Mil/uL (ref 3.87–5.11)
RDW: 13.4 % (ref 11.5–15.5)
WBC: 4.4 10*3/uL (ref 4.0–10.5)

## 2022-07-15 LAB — TSH: TSH: 2.33 u[IU]/mL (ref 0.35–5.50)

## 2022-07-15 NOTE — Assessment & Plan Note (Signed)
CT chest is stable.  Unclear that her fatigue represents clinically relevant Mycobacterium avium but this is possible.  Depending on work-up, evolving symptoms we may need to revisit antibiotic therapy.  Her CT chest would not necessarily suggest active disease.

## 2022-07-15 NOTE — Progress Notes (Signed)
   Subjective:    Patient ID: Tiffany Caldwell, female    DOB: 05/09/1941, 82 y.o.   MRN: [id number]  HPI  ROV 07/15/2022 -59 year old woman with a history of moderately severe COPD, CAD/CABG, breast cancer and untreated Mycobacterium avium.  I have followed her for an abnormal CT scan of the chest with a calcified right lower lobe masslike lesion 3.5 cm and some scattered pulmonary nodular disease, bronchiectatic change.  Today she describes exertional fatigue, has to stop to rest after any exertion. She is not overtly SOB. No fevers, chills, sweats. No cough. She is on Anoro once daily.  She has albuterol available to use if needed  CT chest 07/08/2022 reviewed by me, shows no mediastinal or hilar adenopathy, stable microlobulated partially calcified right lower lobe mass 3.3 x 3.4 cm, unchanged.  There is diffuse bronchial wall thickening evidence for inflammatory change with some cylindrical bronchiectasis and mucoid impaction   Review of Systems  Respiratory:  Positive for shortness of breath.   Musculoskeletal:  Positive for arthralgias.      Objective:   Physical Exam Vitals:   07/15/22 1339  BP: 128/74  Pulse: 65  Temp: 97.8 F (36.6 C)  TempSrc: Oral  SpO2: 95%  Weight: 134 lb 3.2 oz (60.9 kg)  Height: '5\' 2"'$  (1.575 m)    Gen: Pleasant, well-nourished, in no distress,  normal affect, moderate kyphosis  ENT: No lesions,  mouth clear,  oropharynx clear, no postnasal drip  Neck: No JVD, very soft inspiratory and expiratory stridor  Lungs: No use of accessory muscles, distant, no crackles or wheezing on normal respiration, no wheeze on forced expiration  Cardiovascular: RRR, heart sounds normal, no murmur or gallops, no peripheral edema  Musculoskeletal: No deformities, no cyanosis or clubbing  Neuro: alert, awake, non focal  Skin: Warm, no lesions or rash      Assessment & Plan:  Dyspnea on exertion Tiffany Caldwell is dealing with exertional fatigue.  Difficult to say  whether this is explicitly dyspnea.  She has not tried to use albuterol to see if it makes any difference.  We will perform a walking oximetry to rule out occult desaturation.  Check a CBC and a TSH.  Question whether she may also need an a.m. cortisol at some point.  She is changing PCP, Dr. Laurann Montana is retiring.  I will notify her if any lab abnormalities.  I do not think this reflects her mycobacterial disease given the stable CT chest, absence of any cough, sweats, fevers.  I suppose is possible that it could be manifesting itself only as fatigue.  We have to weigh the pros and cons of possible treatment.  She has wanted to avoid 3 drug therapy in the past.  COPD (chronic obstructive pulmonary disease) (HCC) Continue Anoro and albuterol as needed  Pulmonary Mycobacterium avium infection CT chest is stable.  Unclear that her fatigue represents clinically relevant Mycobacterium avium but this is possible.  Depending on work-up, evolving symptoms we may need to revisit antibiotic therapy.  Her CT chest would not necessarily suggest active disease.    Baltazar Apo, MD, PhD 07/15/2022, 1:55 PM [facility] [phone] or if no answer [phone]

## 2022-07-15 NOTE — Patient Instructions (Signed)
We will perform walking oximetry today. Lab work: CBC, TSH We reviewed your CT scan of the chest.  This is stable.  Good news. Continue Anoro once daily. Continue albuterol 2 puffs if needed for shortness of breath, chest tightness, wheezing. Follow with Dr Lamonte Sakai in 6 months or sooner if you have any problems

## 2022-07-15 NOTE — Assessment & Plan Note (Signed)
Tiffany Caldwell is dealing with exertional fatigue.  Difficult to say whether this is explicitly dyspnea.  She has not tried to use albuterol to see if it makes any difference.  We will perform a walking oximetry to rule out occult desaturation.  Check a CBC and a TSH.  Question whether she may also need an a.m. cortisol at some point.  She is changing PCP, Dr. Laurann Montana is retiring.  I will notify her if any lab abnormalities.  I do not think this reflects her mycobacterial disease given the stable CT chest, absence of any cough, sweats, fevers.  I suppose is possible that it could be manifesting itself only as fatigue.  We have to weigh the pros and cons of possible treatment.  She has wanted to avoid 3 drug therapy in the past.

## 2022-07-15 NOTE — Assessment & Plan Note (Signed)
Continue Anoro and albuterol as needed

## 2022-07-16 ENCOUNTER — Other Ambulatory Visit: Payer: Self-pay | Admitting: Plastic Surgery

## 2022-07-16 DIAGNOSIS — C44319 Basal cell carcinoma of skin of other parts of face: Secondary | ICD-10-CM | POA: Diagnosis not present

## 2022-07-21 DIAGNOSIS — M5451 Vertebrogenic low back pain: Secondary | ICD-10-CM | POA: Diagnosis not present

## 2022-07-25 ENCOUNTER — Other Ambulatory Visit: Payer: Self-pay | Admitting: Plastic Surgery

## 2022-07-25 DIAGNOSIS — C4492 Squamous cell carcinoma of skin, unspecified: Secondary | ICD-10-CM | POA: Diagnosis not present

## 2022-07-25 DIAGNOSIS — C44329 Squamous cell carcinoma of skin of other parts of face: Secondary | ICD-10-CM | POA: Diagnosis not present

## 2022-08-04 DIAGNOSIS — G250 Essential tremor: Secondary | ICD-10-CM | POA: Diagnosis not present

## 2022-08-04 DIAGNOSIS — M5136 Other intervertebral disc degeneration, lumbar region: Secondary | ICD-10-CM | POA: Diagnosis not present

## 2022-08-04 DIAGNOSIS — K580 Irritable bowel syndrome with diarrhea: Secondary | ICD-10-CM | POA: Diagnosis not present

## 2022-08-04 DIAGNOSIS — I700 Atherosclerosis of aorta: Secondary | ICD-10-CM | POA: Diagnosis not present

## 2022-08-04 DIAGNOSIS — M5030 Other cervical disc degeneration, unspecified cervical region: Secondary | ICD-10-CM | POA: Diagnosis not present

## 2022-08-04 DIAGNOSIS — E7800 Pure hypercholesterolemia, unspecified: Secondary | ICD-10-CM | POA: Diagnosis not present

## 2022-08-04 DIAGNOSIS — I10 Essential (primary) hypertension: Secondary | ICD-10-CM | POA: Diagnosis not present

## 2022-08-04 DIAGNOSIS — E210 Primary hyperparathyroidism: Secondary | ICD-10-CM | POA: Diagnosis not present

## 2022-08-04 DIAGNOSIS — Z1331 Encounter for screening for depression: Secondary | ICD-10-CM | POA: Diagnosis not present

## 2022-08-04 DIAGNOSIS — J449 Chronic obstructive pulmonary disease, unspecified: Secondary | ICD-10-CM | POA: Diagnosis not present

## 2022-08-04 DIAGNOSIS — M8580 Other specified disorders of bone density and structure, unspecified site: Secondary | ICD-10-CM | POA: Diagnosis not present

## 2022-08-04 DIAGNOSIS — D696 Thrombocytopenia, unspecified: Secondary | ICD-10-CM | POA: Diagnosis not present

## 2022-08-04 DIAGNOSIS — I25810 Atherosclerosis of coronary artery bypass graft(s) without angina pectoris: Secondary | ICD-10-CM | POA: Diagnosis not present

## 2022-08-04 DIAGNOSIS — Z0000 Encounter for general adult medical examination without abnormal findings: Secondary | ICD-10-CM | POA: Diagnosis not present

## 2022-08-05 DIAGNOSIS — H52203 Unspecified astigmatism, bilateral: Secondary | ICD-10-CM | POA: Diagnosis not present

## 2022-08-05 DIAGNOSIS — Z961 Presence of intraocular lens: Secondary | ICD-10-CM | POA: Diagnosis not present

## 2022-08-08 ENCOUNTER — Other Ambulatory Visit: Payer: Self-pay | Admitting: Plastic Surgery

## 2022-08-08 DIAGNOSIS — C4491 Basal cell carcinoma of skin, unspecified: Secondary | ICD-10-CM | POA: Diagnosis not present

## 2022-08-08 DIAGNOSIS — C44319 Basal cell carcinoma of skin of other parts of face: Secondary | ICD-10-CM | POA: Diagnosis not present

## 2022-08-11 ENCOUNTER — Ambulatory Visit (HOSPITAL_BASED_OUTPATIENT_CLINIC_OR_DEPARTMENT_OTHER)
Admission: RE | Admit: 2022-08-11 | Discharge: 2022-08-11 | Disposition: A | Discharge status: Home / Self Care | Payer: PPO | Source: Ambulatory Visit | Attending: Physician Assistant | Admitting: Physician Assistant

## 2022-08-11 ENCOUNTER — Encounter (HOSPITAL_BASED_OUTPATIENT_CLINIC_OR_DEPARTMENT_OTHER): Payer: Self-pay

## 2022-08-11 ENCOUNTER — Other Ambulatory Visit: Payer: Self-pay | Admitting: Physician Assistant

## 2022-08-11 ENCOUNTER — Emergency Department (HOSPITAL_BASED_OUTPATIENT_CLINIC_OR_DEPARTMENT_OTHER)
Admission: EM | Admit: 2022-08-11 | Discharge: 2022-08-11 | Disposition: A | Discharge status: Home / Self Care | Payer: PPO | Attending: Emergency Medicine | Admitting: Emergency Medicine

## 2022-08-11 ENCOUNTER — Other Ambulatory Visit: Payer: Self-pay

## 2022-08-11 DIAGNOSIS — M7989 Other specified soft tissue disorders: Secondary | ICD-10-CM

## 2022-08-11 DIAGNOSIS — Z5321 Procedure and treatment not carried out due to patient leaving prior to being seen by health care provider: Secondary | ICD-10-CM | POA: Diagnosis not present

## 2022-08-11 DIAGNOSIS — R2242 Localized swelling, mass and lump, left lower limb: Secondary | ICD-10-CM | POA: Insufficient documentation

## 2022-08-11 NOTE — ED Triage Notes (Signed)
Pt sent from PCP for LLE swelling and redness. Pt wants to r/o DVT.

## 2022-08-13 DIAGNOSIS — I25810 Atherosclerosis of coronary artery bypass graft(s) without angina pectoris: Secondary | ICD-10-CM | POA: Diagnosis not present

## 2022-08-13 DIAGNOSIS — J449 Chronic obstructive pulmonary disease, unspecified: Secondary | ICD-10-CM | POA: Diagnosis not present

## 2022-08-13 DIAGNOSIS — I10 Essential (primary) hypertension: Secondary | ICD-10-CM | POA: Diagnosis not present

## 2022-08-13 DIAGNOSIS — E7800 Pure hypercholesterolemia, unspecified: Secondary | ICD-10-CM | POA: Diagnosis not present

## 2022-08-13 DIAGNOSIS — K219 Gastro-esophageal reflux disease without esophagitis: Secondary | ICD-10-CM | POA: Diagnosis not present

## 2022-08-18 DIAGNOSIS — M47816 Spondylosis without myelopathy or radiculopathy, lumbar region: Secondary | ICD-10-CM | POA: Diagnosis not present

## 2022-08-19 DIAGNOSIS — I10 Essential (primary) hypertension: Secondary | ICD-10-CM | POA: Diagnosis not present

## 2022-08-19 DIAGNOSIS — M8580 Other specified disorders of bone density and structure, unspecified site: Secondary | ICD-10-CM | POA: Diagnosis not present

## 2022-08-19 DIAGNOSIS — N1831 Chronic kidney disease, stage 3a: Secondary | ICD-10-CM | POA: Diagnosis not present

## 2022-08-19 DIAGNOSIS — E210 Primary hyperparathyroidism: Secondary | ICD-10-CM | POA: Diagnosis not present

## 2022-08-19 DIAGNOSIS — G250 Essential tremor: Secondary | ICD-10-CM | POA: Diagnosis not present

## 2022-09-10 DIAGNOSIS — M47816 Spondylosis without myelopathy or radiculopathy, lumbar region: Secondary | ICD-10-CM | POA: Diagnosis not present

## 2022-09-11 DIAGNOSIS — J449 Chronic obstructive pulmonary disease, unspecified: Secondary | ICD-10-CM | POA: Diagnosis not present

## 2022-09-11 DIAGNOSIS — I10 Essential (primary) hypertension: Secondary | ICD-10-CM | POA: Diagnosis not present

## 2022-09-11 DIAGNOSIS — K219 Gastro-esophageal reflux disease without esophagitis: Secondary | ICD-10-CM | POA: Diagnosis not present

## 2022-09-11 DIAGNOSIS — E7800 Pure hypercholesterolemia, unspecified: Secondary | ICD-10-CM | POA: Diagnosis not present

## 2022-10-27 DIAGNOSIS — M47816 Spondylosis without myelopathy or radiculopathy, lumbar region: Secondary | ICD-10-CM | POA: Diagnosis not present

## 2022-10-27 DIAGNOSIS — M545 Low back pain, unspecified: Secondary | ICD-10-CM | POA: Diagnosis not present

## 2022-11-03 DIAGNOSIS — M47816 Spondylosis without myelopathy or radiculopathy, lumbar region: Secondary | ICD-10-CM | POA: Diagnosis not present

## 2022-11-05 DIAGNOSIS — Z85828 Personal history of other malignant neoplasm of skin: Secondary | ICD-10-CM | POA: Diagnosis not present

## 2022-11-05 DIAGNOSIS — C44519 Basal cell carcinoma of skin of other part of trunk: Secondary | ICD-10-CM | POA: Diagnosis not present

## 2022-11-05 DIAGNOSIS — L812 Freckles: Secondary | ICD-10-CM | POA: Diagnosis not present

## 2022-11-05 DIAGNOSIS — L821 Other seborrheic keratosis: Secondary | ICD-10-CM | POA: Diagnosis not present

## 2022-11-05 DIAGNOSIS — L853 Xerosis cutis: Secondary | ICD-10-CM | POA: Diagnosis not present

## 2022-11-05 DIAGNOSIS — L570 Actinic keratosis: Secondary | ICD-10-CM | POA: Diagnosis not present

## 2022-11-05 DIAGNOSIS — D485 Neoplasm of uncertain behavior of skin: Secondary | ICD-10-CM | POA: Diagnosis not present

## 2022-11-05 DIAGNOSIS — D225 Melanocytic nevi of trunk: Secondary | ICD-10-CM | POA: Diagnosis not present

## 2022-12-01 ENCOUNTER — Other Ambulatory Visit: Payer: Self-pay | Admitting: Physician Assistant

## 2022-12-01 NOTE — Telephone Encounter (Signed)
Can you please reach out to patient to schedule her 6 month follow up.

## 2022-12-17 ENCOUNTER — Telehealth: Payer: Self-pay | Admitting: Internal Medicine

## 2022-12-17 MED ORDER — ATORVASTATIN CALCIUM 20 MG PO TABS: 20.0000 mg | ORAL_TABLET | Freq: Every day | ORAL | 0 refills | Status: DC

## 2022-12-17 NOTE — Telephone Encounter (Signed)
Requested Prescriptions   Signed Prescriptions Disp Refills   atorvastatin (LIPITOR) 20 MG tablet 90 tablet 0    Sig: Take 1 tablet (20 mg total) by mouth daily.    Authorizing Provider: END, CHRISTOPHER    Ordering User: Raelene Bott, Israel Wunder L

## 2022-12-17 NOTE — Telephone Encounter (Signed)
*  STAT* If patient is at the pharmacy, call can be transferred to refill team.   1. Which medications need to be refilled? (please list name of each medication and dose if known) atorvastatin (LIPITOR) 20 MG tablet   2. Which pharmacy/location (including street and city if local pharmacy) is medication to be sent to? [facility] - [place], [place] - [place] [address]   3. Do they need a 30 day or 90 day supply? [facility]

## 2022-12-29 ENCOUNTER — Other Ambulatory Visit: Payer: Self-pay | Admitting: Emergency Medicine

## 2023-01-20 NOTE — Progress Notes (Deleted)
Cardiology Office Note    Date:  01/20/2023   ID:  Tiffany Caldwell [birth year], MRN [id number]  PCP:  Kirby Funk, MD  Cardiologist:  Yvonne Kendall, MD  Electrophysiologist:  None   Chief Complaint: Follow-up  History of Present Illness:   Tiffany Caldwell is a 82 y.o. female with history of CAD status post remote PCI with subsequent emergent 3-vessel CABG for iatrogenic left main/LAD dissection in 2001, right lower lobe cavitary lung mass, labile HTN, HLD, left-sided breast cancer, DDD, COPD, IBS, and GERD who presents for follow-up of CAD and labile hypertension.   Prior left heart cath in 12/1999 showed left main normal, LAD with 80 and 90% mid vessel stenosis, LCx without significant disease, RCA with 30% proximal stenosis.  She underwent successful PCI/BMS to the mid LAD.  Repeat cath on 05/28/2000 showed left main normal, LAD with 80 to 90% mid vessel in-stent restenosis, LCx normal, RCA with 30 to 40% proximal stenosis as well as ostial spasm.  Plan was for angioplasty of the previously placed stent though procedure was complicated by proximal LAD dissection with placement of BMS.  Further propagation of the dissection to the left main occurred prompting referral for urgent CABG.  Patient underwent three-vessel CABG with LIMA to LAD, SVG to diagonal, and SVG to OM.  Echo from 11/2014 showed an EF of 55 to 60% without regional wall motion abnormalities, grade 1 diastolic dysfunction, mild concentric LVH, severely dilated left atrium, normal RV size and systolic function, mild tricuspid regurgitation, otherwise no significant valvular abnormalities.  She underwent Lexiscan MPI in 07/2019 after being evaluated in the ED and ruling out with study showing no significant ischemia and was overall low risk.  She underwent echo in 07/2021 due to a slight progression of chronic exertional dyspnea which demonstrated an EF of 65 to 70%, no regional wall motion abnormalities, grade 1 diastolic  dysfunction, normal RV systolic function and ventricular cavity size, normal PASP, mildly dilated left atrium, trivial mitral regurgitation, mild aortic insufficiency, and an estimated right atrial pressure of 3 mmHg.  She was taken off amlodipine in 2022 with noted improvement in fatigue and lower extremity swelling.  She was last seen in the office in 05/2022 and remained without symptoms of angina or cardiac decompensation.  She reported well-controlled blood pressures at home.  She was continued on HCTZ and telmisartan.  ***   Labs independently reviewed: 07/2022 - Hgb 13.4, PLT 134, TSH normal 08/2021 - BUN 29, serum creatinine 1.16, potassium 4.8 05/2021 - magnesium 2.1 07/2019 - albumin 3.9, AST/ALT normal 12/2017 - TC 155, TG 79, HDL 72, LDL 67  Past Medical History:  Diagnosis Date   Allergy    Arthritis    Breast cancer (HCC)    left breast   Coronary artery disease 2001   CABG   DDD (degenerative disc disease), lumbosacral    Dyspnea    if rushing or climmbing lots of stairs   Emphysema lung (HCC)    emphysema   GERD (gastroesophageal reflux disease)    11/20/16- no a current problem   Hyperlipidemia    Hypertension    Irritable bowel syndrome (IBS)    Lung disorder    leison   Neuropathy    Pinched nerve    back and left leg   Pneumonia 2016    Past Surgical History:  Procedure Laterality Date   BREAST FIBROADENOMA SURGERY  01/13/1980   BREAST SURGERY  02/01/2004   tram/mastectomyfor recurrence  CARPOMETACARPEL SUSPENSION PLASTY Right 12/17/2017   Procedure: SUSPENSIONPLASTY RIGHT THUMB WITH EXCISION TRAPEZIUM;  Surgeon: Cindee Salt, MD;  Location: [facility];  Service: Orthopedics;  Laterality: Right;   COLONOSCOPY     CORONARY ANGIOPLASTY  2001   had tear to two vessels- had to have open heart surgery   CORONARY ARTERY BYPASS GRAFT  2001   EYE SURGERY Bilateral    Cataract   KNEE ARTHROSCOPY  January 2011   right   MASTECTOMY PARTIAL /  LUMPECTOMY W/ AXILLARY LYMPHADENECTOMY  04/10/1989   Dr Maple Hudson / left breast   PARTIAL HYSTERECTOMY     vaginal   TENDON TRANSFER Right 12/17/2017   Procedure: ABDUCTOR POLLICIS LONGUS TRANSFER;  Surgeon: Cindee Salt, MD;  Location: [facility];  Service: Orthopedics;  Laterality: Right;   TONSILLECTOMY AND ADENOIDECTOMY     TOTAL HIP ARTHROPLASTY Right 08/05/2016   Procedure: TOTAL HIP ARTHROPLASTY ANTERIOR APPROACH;  Surgeon: Marcene Corning, MD;  Location: [facility] OR;  Service: Orthopedics;  Laterality: Right;   UPPER GASTROINTESTINAL ENDOSCOPY     VIDEO BRONCHOSCOPY WITH ENDOBRONCHIAL NAVIGATION N/A 11/27/2016   Procedure: VIDEO BRONCHOSCOPY WITH ENDOBRONCHIAL NAVIGATION;  Surgeon: Loreli Slot, MD;  Location: [facility] OR;  Service: Thoracic;  Laterality: N/A;    Current Medications: No outpatient medications have been marked as taking for the 01/21/23 encounter (Appointment) with Sondra Barges, PA-C.    Allergies:   Amlodipine, Hydromorphone, Azithromycin, Ciprofloxacin, Hydromorphone hcl, Levofloxacin, and Lisinopril   Social History   Socioeconomic History   Marital status: Married    Spouse name: Not on file   Number of children: Not on file   Years of education: Not on file   Highest education level: Not on file  Occupational History   Not on file  Tobacco Use   Smoking status: Former    Years: 30    Types: Cigarettes    Quit date: 07/31/1989    Years since quitting: 33.4   Smokeless tobacco: Never  Vaping Use   Vaping Use: Never used  Substance and Sexual Activity   Alcohol use: Yes    Alcohol/week: 1.0 standard drink of alcohol    Types: 1 Glasses of wine per week    Comment: occasional wine   Drug use: No   Sexual activity: Not on file  Other Topics Concern   Not on file  Social History Narrative   Not on file   Social Determinants of Health   Financial Resource Strain: Not on file  Food Insecurity: Not on file  Transportation Needs: Not on file   Physical Activity: Not on file  Stress: Not on file  Social Connections: Not on file     Family History:  The patient's family history includes Colon cancer in her paternal grandmother, paternal uncle, and son; Emphysema in her father; Heart disease in her mother.  ROS:   12-point review of systems is negative unless otherwise noted in the HPI.   EKGs/Labs/Other Studies Reviewed:    Studies reviewed were summarized above. The additional studies were reviewed today:  2D echo 07/09/2021: 1. Left ventricular ejection fraction, by estimation, is 65 to 70%. The  left ventricle has normal function. The left ventricle has no regional  wall motion abnormalities. Left ventricular diastolic parameters are  consistent with Grade I diastolic  dysfunction (impaired relaxation).   2. Right ventricular systolic function is normal. The right ventricular  size is normal. There is normal pulmonary artery systolic pressure.  3. Left atrial size was mildly dilated.   4. The mitral valve is normal in structure. Trivial mitral valve  regurgitation. No evidence of mitral stenosis.   5. The aortic valve is tricuspid. Aortic valve regurgitation is mild. No  aortic stenosis is present.   6. The inferior vena cava is normal in size with greater than 50%  respiratory variability, suggesting right atrial pressure of 3 mmHg.   Comparison(s): LVEF 55-60%. __________   Eugenie Birks MPI 08/05/2019: Pharmacological myocardial perfusion imaging study with no significant  ischemia Normal wall motion, EF estimated at 83% No EKG changes concerning for ischemia at peak stress or in recovery. Resting EKG with T wave ABN in V3 to V6, II, III, AVF.  APCs and PVCs noted CT attenuation corrected images with aortic atherosclerosis, coronary calcification in the LAD Low risk scan __________   2D echo 11/08/2014: - Left ventricle: The cavity size was normal. There was mild    concentric hypertrophy. Systolic function  was normal. The    estimated ejection fraction was in the range of 55% to 60%. Wall    motion was normal; there were no regional wall motion    abnormalities. Doppler parameters are consistent with abnormal    left ventricular relaxation (grade 1 diastolic dysfunction). The    E/e&' ratio is between 8-15, suggesting indeterminate LV filling    pressure.  - Aortic valve: Sclerosis without stenosis. Regurgitation pressure    half-time: 574 ms.  - Mitral valve: Mildly thickened leaflets . There was trivial    regurgitation.  - Left atrium: Massively dilated at 133 ml/m2.  - Atrial septum: A septal defect cannot be excluded.  - Tricuspid valve: There was mild regurgitation.  - Pulmonary arteries: PA peak pressure: 40 mm Hg (S).  - Inferior vena cava: The vessel was normal in size. The    respirophasic diameter changes were in the normal range (>= 50%),    consistent with normal central venous pressure.   Impressions:   - LVEF 55-60%, mild LVH, diastolic dysfunction, indeterminate LV    filling pressure, aortic sclerosis with trivial AI, mild TR, RVSP    40 mmHg, massive left atrial enlargment.   EKG:  EKG is ordered today.  The EKG ordered today demonstrates ***  Recent Labs: 07/15/2022: Hemoglobin 13.4; Platelets 134.0; TSH 2.33  Recent Lipid Panel    Component Value Date/Time   CHOL 155 12/08/2017 1045   TRIG 79 12/08/2017 1045   HDL 72 12/08/2017 1045   CHOLHDL 2.2 12/08/2017 1045   LDLCALC 67 12/08/2017 1045    PHYSICAL EXAM:    VS:  LMP  (LMP Unknown)   BMI: There is no height or weight on file to calculate BMI.  Physical Exam  Wt Readings from Last 3 Encounters:  07/15/22 134 lb 3.2 oz (60.9 kg)  05/09/22 135 lb 6 oz (61.4 kg)  10/24/21 135 lb (61.2 kg)     ASSESSMENT & PLAN:   CAD status post CABG without***angina:  Labile hypertension: Blood pressure  HLD: LDL 67 in 12/2017.   {Are you ordering a CV Procedure (e.g. stress test, cath, DCCV, TEE, etc)?    Press F2        :[id number]}     Disposition: F/u with Dr. Okey Dupre or an APP in ***.   Medication Adjustments/Labs and Tests Ordered: Current medicines are reviewed at length with the patient today.  Concerns regarding medicines are outlined above. Medication changes, Labs and Tests ordered today are summarized above  and listed in the Patient Instructions accessible in Encounters.   Signed, Eula Listen, PA-C 01/20/2023 1:54 PM     [facility] -  [address] [place], [place] [address] [phone]

## 2023-01-21 ENCOUNTER — Ambulatory Visit: Payer: PPO | Admitting: Physician Assistant

## 2023-01-21 DIAGNOSIS — R0989 Other specified symptoms and signs involving the circulatory and respiratory systems: Secondary | ICD-10-CM

## 2023-01-21 DIAGNOSIS — Z951 Presence of aortocoronary bypass graft: Secondary | ICD-10-CM

## 2023-01-21 DIAGNOSIS — I2510 Atherosclerotic heart disease of native coronary artery without angina pectoris: Secondary | ICD-10-CM

## 2023-01-21 DIAGNOSIS — E785 Hyperlipidemia, unspecified: Secondary | ICD-10-CM

## 2023-02-13 NOTE — Progress Notes (Unsigned)
Cardiology Office Note    Date:  02/16/2023   ID:  Tiffany Caldwell, Tiffany Caldwell [birth year], MRN [id number]  PCP:  Kirby Funk, MD  Cardiologist:  Yvonne Kendall, MD  Electrophysiologist:  None   Chief Complaint: Follow-up  History of Present Illness:   Tiffany Caldwell is a 82 y.o. female with history of CAD status post remote PCI with subsequent emergent 3-vessel CABG for iatrogenic left main/LAD dissection in 2001, right lower lobe cavitary lung mass, labile HTN, HLD, left-sided breast cancer, DDD, COPD, IBS, and GERD who presents for follow-up of CAD and labile hypertension.   Prior left heart cath in 12/1999 showed left main normal, LAD with 80 and 90% mid vessel stenosis, LCx without significant disease, RCA with 30% proximal stenosis.  She underwent successful PCI/BMS to the mid LAD.  Repeat cath on 05/28/2000 showed left main normal, LAD with 80 to 90% mid vessel in-stent restenosis, LCx normal, RCA with 30 to 40% proximal stenosis as well as ostial spasm.  Plan was for angioplasty of the previously placed stent though procedure was complicated by proximal LAD dissection with placement of BMS.  Further propagation of the dissection to the left main occurred prompting referral for urgent CABG.  Patient underwent three-vessel CABG with LIMA to LAD, SVG to diagonal, and SVG to OM.  Echo from 11/2014 showed an EF of 55 to 60% without regional wall motion abnormalities, grade 1 diastolic dysfunction, mild concentric LVH, severely dilated left atrium, normal RV size and systolic function, mild tricuspid regurgitation, otherwise no significant valvular abnormalities.  She underwent Lexiscan MPI in 07/2019 after being evaluated in the ED and ruling out with study showing no significant ischemia and was overall low risk.  She underwent echo in 07/2021 due to a slight progression of chronic exertional dyspnea which demonstrated an EF of 65 to 70%, no regional wall motion abnormalities, grade 1 diastolic  dysfunction, normal RV systolic function and ventricular cavity size, normal PASP, mildly dilated left atrium, trivial mitral regurgitation, mild aortic insufficiency, and an estimated right atrial pressure of 3 mmHg.  She was taken off amlodipine in 2022 with noted improvement in fatigue and lower extremity swelling.  She was seen in the office in 10/2021, and continued to note labile blood pressure readings at home, sometimes up to 160 mmHg systolic in the evenings.  She reported 1 episode of hypotension several weeks prior with her blood pressure being around 80/50 at that time.  Her chronic exertional dyspnea was stable.  Medication changes were deferred.  She was last seen in the office in 05/2022 and remained without symptoms of angina or cardiac decompensation.  She reported well-controlled blood pressures at home, though was not checking them as frequently as she previously did.  No significant episodes of hyper or hypotension with HCTZ in the morning and telmisartan in the evening.  She comes in doing well from a cardiac perspective.  Outside office has discontinued her HCTZ secondary to calcium levels.  In this setting, she was placed on amlodipine.  With this, she has noted a return in lower extremity swelling.  She remains on telmisartan 40 mg daily.  Blood pressures have been well-controlled at home.  No symptoms of angina or progressive dyspnea.  No dizziness, presyncope, or syncope.  No progressive orthopnea.  No falls or symptoms concerning for bleeding.   Labs independently reviewed: 07/2022 - Hgb 13.4, PLT 134, TSH normal 08/2021 - BUN 29, serum creatinine 1.16, potassium 4.8 05/2021 - magnesium 2.1  07/2019 - albumin 3.9, AST/ALT normal 12/2017 - TC 155, TG 79, HDL 72, LDL 67  Past Medical History:  Diagnosis Date   Allergy    Arthritis    Breast cancer (HCC)    left breast   Coronary artery disease 2001   CABG   DDD (degenerative disc disease), lumbosacral    Dyspnea    if  rushing or climmbing lots of stairs   Emphysema lung (HCC)    emphysema   GERD (gastroesophageal reflux disease)    11/20/16- no a current problem   Hyperlipidemia    Hypertension    Irritable bowel syndrome (IBS)    Lung disorder    leison   Neuropathy    Pinched nerve    back and left leg   Pneumonia 2016    Past Surgical History:  Procedure Laterality Date   BREAST FIBROADENOMA SURGERY  01/13/1980   BREAST SURGERY  02/01/2004   tram/mastectomyfor recurrence   CARPOMETACARPEL SUSPENSION PLASTY Right 12/17/2017   Procedure: SUSPENSIONPLASTY RIGHT THUMB WITH EXCISION TRAPEZIUM;  Surgeon: Cindee Salt, MD;  Location: [facility];  Service: Orthopedics;  Laterality: Right;   COLONOSCOPY     CORONARY ANGIOPLASTY  2001   had tear to two vessels- had to have open heart surgery   CORONARY ARTERY BYPASS GRAFT  2001   EYE SURGERY Bilateral    Cataract   KNEE ARTHROSCOPY  January 2011   right   MASTECTOMY PARTIAL / LUMPECTOMY W/ AXILLARY LYMPHADENECTOMY  04/10/1989   Dr Maple Hudson / left breast   PARTIAL HYSTERECTOMY     vaginal   TENDON TRANSFER Right 12/17/2017   Procedure: ABDUCTOR POLLICIS LONGUS TRANSFER;  Surgeon: Cindee Salt, MD;  Location:  SURGERY CENTER;  Service: Orthopedics;  Laterality: Right;   TONSILLECTOMY AND ADENOIDECTOMY     TOTAL HIP ARTHROPLASTY Right 08/05/2016   Procedure: TOTAL HIP ARTHROPLASTY ANTERIOR APPROACH;  Surgeon: Marcene Corning, MD;  Location: [facility] OR;  Service: Orthopedics;  Laterality: Right;   UPPER GASTROINTESTINAL ENDOSCOPY     VIDEO BRONCHOSCOPY WITH ENDOBRONCHIAL NAVIGATION N/A 11/27/2016   Procedure: VIDEO BRONCHOSCOPY WITH ENDOBRONCHIAL NAVIGATION;  Surgeon: Loreli Slot, MD;  Location: [facility] OR;  Service: Thoracic;  Laterality: N/A;    Current Medications: Current Meds  Medication Sig   acetaminophen (TYLENOL) 500 MG tablet Take 1,000 mg by mouth 2 (two) times daily.    albuterol (PROAIR HFA) 108 (90 Base) MCG/ACT  inhaler Inhale 1-2 puffs into the lungs every 6 (six) hours as needed for wheezing or shortness of breath.   ANORO ELLIPTA 62.5-25 MCG/ACT AEPB INHALE 1 PUFF BY MOUTH INTO LUNGS BY MOUTH DAILY   aspirin EC 81 MG tablet Take 81 mg by mouth daily.   atorvastatin (LIPITOR) 20 MG tablet Take 1 tablet (20 mg total) by mouth daily.   Biotin 13086 MCG TABS Take 1 tablet by mouth daily.   carvedilol (COREG) 3.125 MG tablet Take 1 tablet (3.125 mg total) by mouth 2 (two) times daily.   fexofenadine (ALLEGRA) 180 MG tablet Take 180 mg by mouth daily.   fish oil-omega-3 fatty acids 1000 MG capsule Take 1 g by mouth daily.    gabapentin (NEURONTIN) 300 MG capsule Take 300 mg by mouth 4 (four) times daily.    Glucosamine-Chondroit-Vit C-Mn (GLUCOSAMINE CHONDR 1500 COMPLX PO) Take 1 tablet by mouth 2 (two) times daily.   hyoscyamine (LEVBID) 0.375 MG 12 hr tablet Take 0.375 mg by mouth every 12 (twelve) hours as needed for  cramping (IBS).    Multiple Vitamin (MULTIVITAMIN) tablet Take 1 tablet by mouth daily.     nitroGLYCERIN (NITROSTAT) 0.4 MG SL tablet Place 0.4 mg under the tongue every 5 (five) minutes as needed for chest pain.   telmisartan (MICARDIS) 40 MG tablet Take 40 mg by mouth daily.   [DISCONTINUED] amLODipine (NORVASC) 5 MG tablet Take 5 mg by mouth daily.    Allergies:   Amlodipine, Hydromorphone, Azithromycin, Ciprofloxacin, Hydromorphone hcl, Levofloxacin, and Lisinopril   Social History   Socioeconomic History   Marital status: Married    Spouse name: Not on file   Number of children: Not on file   Years of education: Not on file   Highest education level: Not on file  Occupational History   Not on file  Tobacco Use   Smoking status: Former    Years: 30    Types: Cigarettes    Quit date: 07/31/1989    Years since quitting: 33.5   Smokeless tobacco: Never  Vaping Use   Vaping Use: Never used  Substance and Sexual Activity   Alcohol use: Yes    Alcohol/week: 1.0 standard  drink of alcohol    Types: 1 Glasses of wine per week    Comment: occasional wine   Drug use: No   Sexual activity: Not on file  Other Topics Concern   Not on file  Social History Narrative   Not on file   Social Determinants of Health   Financial Resource Strain: Not on file  Food Insecurity: Not on file  Transportation Needs: Not on file  Physical Activity: Not on file  Stress: Not on file  Social Connections: Not on file     Family History:  The patient's family history includes Colon cancer in her paternal grandmother, paternal uncle, and son; Emphysema in her father; Heart disease in her mother.  ROS:   12-point review of systems is negative unless otherwise noted in the HPI.   EKGs/Labs/Other Studies Reviewed:    Studies reviewed were summarized above. The additional studies were reviewed today:  2D echo 07/09/2021: 1. Left ventricular ejection fraction, by estimation, is 65 to 70%. The  left ventricle has normal function. The left ventricle has no regional  wall motion abnormalities. Left ventricular diastolic parameters are  consistent with Grade I diastolic  dysfunction (impaired relaxation).   2. Right ventricular systolic function is normal. The right ventricular  size is normal. There is normal pulmonary artery systolic pressure.   3. Left atrial size was mildly dilated.   4. The mitral valve is normal in structure. Trivial mitral valve  regurgitation. No evidence of mitral stenosis.   5. The aortic valve is tricuspid. Aortic valve regurgitation is mild. No  aortic stenosis is present.   6. The inferior vena cava is normal in size with greater than 50%  respiratory variability, suggesting right atrial pressure of 3 mmHg.   Comparison(s): LVEF 55-60%. __________   Eugenie Birks MPI 08/05/2019: Pharmacological myocardial perfusion imaging study with no significant  ischemia Normal wall motion, EF estimated at 83% No EKG changes concerning for ischemia at peak  stress or in recovery. Resting EKG with T wave ABN in V3 to V6, II, III, AVF.  APCs and PVCs noted CT attenuation corrected images with aortic atherosclerosis, coronary calcification in the LAD Low risk scan __________   2D echo 11/08/2014: - Left ventricle: The cavity size was normal. There was mild    concentric hypertrophy. Systolic function was normal. The  estimated ejection fraction was in the range of 55% to 60%. Wall    motion was normal; there were no regional wall motion    abnormalities. Doppler parameters are consistent with abnormal    left ventricular relaxation (grade 1 diastolic dysfunction). The    E/e&' ratio is between 8-15, suggesting indeterminate LV filling    pressure.  - Aortic valve: Sclerosis without stenosis. Regurgitation pressure    half-time: 574 ms.  - Mitral valve: Mildly thickened leaflets . There was trivial    regurgitation.  - Left atrium: Massively dilated at 133 ml/m2.  - Atrial septum: A septal defect cannot be excluded.  - Tricuspid valve: There was mild regurgitation.  - Pulmonary arteries: PA peak pressure: 40 mm Hg (S).  - Inferior vena cava: The vessel was normal in size. The    respirophasic diameter changes were in the normal range (>= 50%),    consistent with normal central venous pressure.   Impressions:   - LVEF 55-60%, mild LVH, diastolic dysfunction, indeterminate LV    filling pressure, aortic sclerosis with trivial AI, mild TR, RVSP    40 mmHg, massive left atrial enlargment.   EKG:  EKG is ordered today.  The EKG ordered today demonstrates NSR, 69 bpm, occasional PACs, LVH with early repolarization abnormalities consistent with prior tracing  Recent Labs: 07/15/2022: Hemoglobin 13.4; Platelets 134.0; TSH 2.33  Recent Lipid Panel    Component Value Date/Time   CHOL 155 12/08/2017 1045   TRIG 79 12/08/2017 1045   HDL 72 12/08/2017 1045   CHOLHDL 2.2 12/08/2017 1045   LDLCALC 67 12/08/2017 1045    PHYSICAL EXAM:     VS:  BP 138/72 (BP Location: Left Arm, Patient Position: Sitting, Cuff Size: Normal)   Pulse 69   Ht 5\' 5"  (1.651 m)   Wt 131 lb 3.2 oz (59.5 kg)   LMP  (LMP Unknown)   SpO2 95%   BMI 21.83 kg/m   BMI: Body mass index is 21.83 kg/m.  Physical Exam Vitals reviewed.  Constitutional:      Appearance: She is well-developed.  HENT:     Head: Normocephalic and atraumatic.  Eyes:     General:        Right eye: No discharge.        Left eye: No discharge.  Neck:     Vascular: No JVD.  Cardiovascular:     Rate and Rhythm: Normal rate and regular rhythm. Occasional Extrasystoles are present.    Pulses:          Posterior tibial pulses are 2+ on the right side and 2+ on the left side.     Heart sounds: Normal heart sounds, S1 normal and S2 normal. Heart sounds not distant. No midsystolic click and no opening snap. No murmur heard.    No friction rub.  Pulmonary:     Effort: Pulmonary effort is normal. No respiratory distress.     Breath sounds: Normal breath sounds. No decreased breath sounds, wheezing or rales.  Chest:     Chest wall: No tenderness.  Abdominal:     General: There is no distension.  Musculoskeletal:     Cervical back: Normal range of motion.     Comments: Trivial bilateral pretibial edema.  Skin:    General: Skin is warm and dry.     Nails: There is no clubbing.  Neurological:     Mental Status: She is alert and oriented to person, place, and time.  Psychiatric:  Speech: Speech normal.        Behavior: Behavior normal.        Thought Content: Thought content normal.        Judgment: Judgment normal.     Wt Readings from Last 3 Encounters:  02/16/23 131 lb 3.2 oz (59.5 kg)  07/15/22 134 lb 3.2 oz (60.9 kg)  05/09/22 135 lb 6 oz (61.4 kg)     ASSESSMENT & PLAN:   CAD status post CABG without angina: She is doing well continue.  Continue aggressive risk factor modification and carvedilol as outlined below.  No indication for further ischemic  testing at this time.  Labile hypertension: Blood pressure is reasonably controlled in the office today.  She has known history of lower extremity swelling on amlodipine.  I suspect this has contributed to the recurrence of her lower extremity swelling.  Discontinue amlodipine.  Start carvedilol 3.125 mg twice daily.  This will also improve her PAC burden.  She will otherwise continue telmisartan 40 mg daily.  HLD: LDL 67 in 2019.  She remains on atorvastatin 20.  Look to obtain lipid panel at next visit.   Disposition: F/u with Dr. Okey Dupre or an APP in 2 month.   Medication Adjustments/Labs and Tests Ordered: Current medicines are reviewed at length with the patient today.  Concerns regarding medicines are outlined above. Medication changes, Labs and Tests ordered today are summarized above and listed in the Patient Instructions accessible in Encounters.   Signed, Eula Listen, PA-C 02/16/2023 4:30 PM     [facility] - [place] [address] [place], [place] [address] [phone]

## 2023-02-16 ENCOUNTER — Ambulatory Visit: Payer: PPO | Attending: Physician Assistant | Admitting: Physician Assistant

## 2023-02-16 ENCOUNTER — Encounter: Payer: Self-pay | Admitting: Physician Assistant

## 2023-02-16 VITALS — BP 138/72 | HR 69 | Ht 65.0 in | Wt 131.2 lb

## 2023-02-16 DIAGNOSIS — E785 Hyperlipidemia, unspecified: Secondary | ICD-10-CM

## 2023-02-16 DIAGNOSIS — Z951 Presence of aortocoronary bypass graft: Secondary | ICD-10-CM

## 2023-02-16 DIAGNOSIS — R0989 Other specified symptoms and signs involving the circulatory and respiratory systems: Secondary | ICD-10-CM

## 2023-02-16 DIAGNOSIS — I2510 Atherosclerotic heart disease of native coronary artery without angina pectoris: Secondary | ICD-10-CM | POA: Diagnosis not present

## 2023-02-16 MED ORDER — CARVEDILOL 3.125 MG PO TABS: 3.1250 mg | ORAL_TABLET | Freq: Two times a day (BID) | ORAL | 3 refills | Status: DC

## 2023-02-16 NOTE — Patient Instructions (Signed)
Medication Instructions:  Your physician has recommended you make the following change in your medication:   START - carvedilol (COREG) 3.125 MG tablet - Take 1 tablet (3.125 mg total) by mouth 2 (two) times daily. STOP - amLODipine (NORVASC) 5 MG tablet - Take 5 mg by mouth daily  *If you need a refill on your cardiac medications before your next appointment, please call your pharmacy*   Lab Work: -None ordered If you have labs (blood work) drawn today and your tests are completely normal, you will receive your results only by: MyChart Message (if you have MyChart) OR A paper copy in the mail If you have any lab test that is abnormal or we need to change your treatment, we will call you to review the results.   Testing/Procedures: -None ordered   Follow-Up: At [facility], you and your health needs are our priority.  As part of our continuing mission to provide you with exceptional heart care, we have created designated Provider Care Teams.  These Care Teams include your primary Cardiologist (physician) and Advanced Practice Providers (APPs -  Physician Assistants and Nurse Practitioners) who all work together to provide you with the care you need, when you need it.  We recommend signing up for the patient portal called "MyChart".  Sign up information is provided on this After Visit Summary.  MyChart is used to connect with patients for Virtual Visits (Telemedicine).  Patients are able to view lab/test results, encounter notes, upcoming appointments, etc.  Non-urgent messages can be sent to your provider as well.   To learn more about what you can do with MyChart, go to [email or web address].    Your next appointment:   2 month(s)  Provider:   Eula Listen, PA-C    Other Instructions -None

## 2023-03-03 ENCOUNTER — Encounter: Payer: Self-pay | Admitting: Emergency Medicine

## 2023-03-03 ENCOUNTER — Ambulatory Visit (INDEPENDENT_AMBULATORY_CARE_PROVIDER_SITE_OTHER): Payer: PPO | Admitting: Emergency Medicine

## 2023-03-03 VITALS — BP 136/70 | HR 57 | Temp 98.1°F | Ht 65.0 in | Wt 134.8 lb

## 2023-03-03 DIAGNOSIS — J449 Chronic obstructive pulmonary disease, unspecified: Secondary | ICD-10-CM | POA: Diagnosis not present

## 2023-03-03 DIAGNOSIS — A310 Pulmonary mycobacterial infection: Secondary | ICD-10-CM | POA: Diagnosis not present

## 2023-03-03 DIAGNOSIS — R911 Solitary pulmonary nodule: Secondary | ICD-10-CM

## 2023-03-03 NOTE — Assessment & Plan Note (Signed)
We will plan to repeat your CT scan of the chest without contrast in October 2024 to compare with your priors. Please follow Dr. Delton Coombes in October after your CT scan so we can review those results.  Call sooner if you have any problems.

## 2023-03-03 NOTE — Assessment & Plan Note (Signed)
We will perform a repeat walking oximetry today and if you qualify plan to order a portable oxygen concentrator for you to use with exertion. Please continue Anoro once daily as you have been using it Keep albuterol available to use 2 puffs if needed for shortness of breath, chest tightness, wheezing.

## 2023-03-03 NOTE — Patient Instructions (Signed)
We will perform a repeat walking oximetry today and if you qualify plan to order a portable oxygen concentrator for you to use with exertion. Please continue Anoro once daily as you have been using it Keep albuterol available to use 2 puffs if needed for shortness of breath, chest tightness, wheezing. We will plan to repeat your CT scan of the chest without contrast in October 2024 to compare with your priors. Please follow Dr. Delton Coombes in October after your CT scan so we can review those results.  Call sooner if you have any problems.

## 2023-03-03 NOTE — Assessment & Plan Note (Signed)
Untreated but minimal symptoms.  No clear indication to treat unless her CT chest shows progression or she develops cough, sputum, constitutional symptoms, etc.

## 2023-03-03 NOTE — Progress Notes (Signed)
Subjective:    Patient ID: Tiffany Caldwell, female    DOB: 02-20-1941, 82 y.o.   MRN: [id number]  HPI  ROV 07/15/2022 -59 year old woman with a history of moderately severe COPD, CAD/CABG, breast cancer and untreated Mycobacterium avium.  I have followed her for an abnormal CT scan of the chest with a calcified right lower lobe masslike lesion 3.5 cm and some scattered pulmonary nodular disease, bronchiectatic change.  Today she describes exertional fatigue, has to stop to rest after any exertion. She is not overtly SOB. No fevers, chills, sweats. No cough. She is on Anoro once daily.  She has albuterol available to use if needed  CT chest 07/08/2022 reviewed by me, shows no mediastinal or hilar adenopathy, stable microlobulated partially calcified right lower lobe mass 3.3 x 3.4 cm, unchanged.  There is diffuse bronchial wall thickening evidence for inflammatory change with some cylindrical bronchiectasis and mucoid impaction  ROV 03/03/2023 --[name] is 65 with history of moderately severe COPD and untreated Mycobacterium avium.  Also with a history of breast cancer and CAD/CABG.  We have been following an abnormal CT scan of the chest with a calcified right lower lobe masslike lesion 3.5 cm and some scattered pulmonary nodular disease and bronchiectasis.  Most recent scan 07/08/2022.  When I last saw her in October 2023 she was dealing with exertional fatigue.  We discovered exertional hypoxemia and was titrated to 2 L/min with exertion. O2 was not ordered. She has SOB with extended stair-climbing. No real cough. Remains on Anoro. No flares since last time. Her vaccines are up to date.    Review of Systems  Respiratory:  Positive for shortness of breath.   Musculoskeletal:  Positive for arthralgias.      Objective:   Physical Exam Vitals:   03/03/23 1359  BP: 136/70  Pulse: (!) 57  Temp: 98.1 F (36.7 C)  TempSrc: Oral  SpO2: 93%  Weight: 134 lb 12.8 oz (61.1 kg)  Height: 5'  5" (1.651 m)    Gen: Pleasant, well-nourished, in no distress,  normal affect, moderate kyphosis  ENT: No lesions,  mouth clear,  oropharynx clear, no postnasal drip  Neck: No JVD, very soft inspiratory and expiratory stridor  Lungs: No use of accessory muscles, distant, no crackles or wheezing on normal respiration, no wheeze on forced expiration  Cardiovascular: RRR, heart sounds normal, no murmur or gallops, no peripheral edema  Musculoskeletal: No deformities, no cyanosis or clubbing  Neuro: alert, awake, non focal  Skin: Warm, no lesions or rash      Assessment & Plan:  COPD (chronic obstructive pulmonary disease) (HCC) We will perform a repeat walking oximetry today and if you qualify plan to order a portable oxygen concentrator for you to use with exertion. Please continue Anoro once daily as you have been using it Keep albuterol available to use 2 puffs if needed for shortness of breath, chest tightness, wheezing.  Pulmonary Mycobacterium avium infection Untreated but minimal symptoms.  No clear indication to treat unless her CT chest shows progression or she develops cough, sputum, constitutional symptoms, etc.  Solitary pulmonary nodule We will plan to repeat your CT scan of the chest without contrast in October 2024 to compare with your priors. Please follow Dr. Delton Coombes in October after your CT scan so we can review those results.  Call sooner if you have any problems.  Levy Pupa, MD, PhD 03/03/2023, 4:52 PM [facility] [phone] or if no answer [phone]

## 2023-03-13 ENCOUNTER — Other Ambulatory Visit: Payer: Self-pay

## 2023-03-13 MED ORDER — ATORVASTATIN CALCIUM 20 MG PO TABS: 20.0000 mg | ORAL_TABLET | Freq: Every day | ORAL | 0 refills | Status: AC

## 2023-04-01 ENCOUNTER — Other Ambulatory Visit: Payer: PPO

## 2023-04-13 ENCOUNTER — Other Ambulatory Visit: Payer: Self-pay | Admitting: Physician Assistant

## 2023-04-13 DIAGNOSIS — I10 Essential (primary) hypertension: Secondary | ICD-10-CM

## 2023-05-05 ENCOUNTER — Ambulatory Visit: Payer: PPO | Attending: Physician Assistant | Admitting: Physician Assistant

## 2023-05-05 ENCOUNTER — Encounter: Payer: Self-pay | Admitting: Physician Assistant

## 2023-05-05 VITALS — BP 146/86 | HR 53 | Ht 65.0 in | Wt 130.0 lb

## 2023-05-05 DIAGNOSIS — R0989 Other specified symptoms and signs involving the circulatory and respiratory systems: Secondary | ICD-10-CM

## 2023-05-05 DIAGNOSIS — Z79899 Other long term (current) drug therapy: Secondary | ICD-10-CM | POA: Diagnosis not present

## 2023-05-05 DIAGNOSIS — I2510 Atherosclerotic heart disease of native coronary artery without angina pectoris: Secondary | ICD-10-CM | POA: Diagnosis not present

## 2023-05-05 DIAGNOSIS — R0609 Other forms of dyspnea: Secondary | ICD-10-CM

## 2023-05-05 DIAGNOSIS — E785 Hyperlipidemia, unspecified: Secondary | ICD-10-CM

## 2023-05-05 DIAGNOSIS — Z951 Presence of aortocoronary bypass graft: Secondary | ICD-10-CM

## 2023-05-05 MED ORDER — FUROSEMIDE 20 MG PO TABS: 20.0000 mg | ORAL_TABLET | ORAL | 3 refills | Status: DC

## 2023-05-05 NOTE — Progress Notes (Signed)
Cardiology Office Note    Date:  05/05/2023   ID:  Tiffany, Caldwell 1941-04-08, MRN [id number]  PCP:  Kirby Funk, MD (Inactive)  Cardiologist:  Yvonne Kendall, MD  Electrophysiologist:  None   Chief Complaint: Follow up  History of Present Illness:   Tiffany Caldwell is a 82 y.o. female with history of CAD status post remote PCI with subsequent emergent 3-vessel CABG for iatrogenic left main/LAD dissection in 2001, right lower lobe cavitary lung mass, labile HTN, HLD, left-sided breast cancer, DDD, COPD, IBS, and GERD who presents for follow-up of CAD and labile hypertension.   Prior left heart cath in 12/1999 showed left main normal, LAD with 80 and 90% mid vessel stenosis, LCx without significant disease, RCA with 30% proximal stenosis.  She underwent successful PCI/BMS to the mid LAD.  Repeat cath on 05/28/2000 showed left main normal, LAD with 80 to 90% mid vessel in-stent restenosis, LCx normal, RCA with 30 to 40% proximal stenosis as well as ostial spasm.  Plan was for angioplasty of the previously placed stent though procedure was complicated by proximal LAD dissection with placement of BMS.  Further propagation of the dissection to the left main occurred prompting referral for urgent CABG.  Patient underwent three-vessel CABG with LIMA to LAD, SVG to diagonal, and SVG to OM.  Echo from 11/2014 showed an EF of 55 to 60% without regional wall motion abnormalities, grade 1 diastolic dysfunction, mild concentric LVH, severely dilated left atrium, normal RV size and systolic function, mild tricuspid regurgitation, otherwise no significant valvular abnormalities.  She underwent Lexiscan MPI in 07/2019 after being evaluated in the ED and ruling out with study showing no significant ischemia and was overall low risk.  She underwent echo in 07/2021 due to a slight progression of chronic exertional dyspnea which demonstrated an EF of 65 to 70%, no regional wall motion abnormalities, grade 1  diastolic dysfunction, normal RV systolic function and ventricular cavity size, normal PASP, mildly dilated left atrium, trivial mitral regurgitation, mild aortic insufficiency, and an estimated right atrial pressure of 3 mmHg.  She was taken off amlodipine in 2022 with noted improvement in fatigue and lower extremity swelling.  She was seen in the office in 10/2021, and continued to note labile blood pressure readings at home, sometimes up to 160 mmHg systolic in the evenings.  She reported 1 episode of hypotension several weeks prior with her blood pressure being around 80/50 at that time.  Her chronic exertional dyspnea was stable.  Medication changes were deferred.  She was seen in the office in 05/2022 and remained without symptoms of angina or cardiac decompensation.  She reported well-controlled blood pressures at home, though was not checking them as frequently as she previously did.  No significant episodes of hyper or hypotension with HCTZ in the morning and telmisartan in the evening.  She was last seen in the office in 02/2023 noting outside office discontinued HCTZ secondary to calcium levels.  In this setting, she had been placed on amlodipine.  With this that she noted a return in lower extremity swelling.  Blood pressures were well-controlled at home.  Amlodipine was discontinued and she was started on carvedilol 3.125 mg twice daily with continuation of telmisartan 40 mg.  She comes in today and is doing well from a cardiac perspective.  No chest pain or symptoms of cardiac decompensation.  Following discontinuation of amlodipine her lower extremity swelling did improve some.  She does continue to wear compression  socks.  Lower extremity swelling is typically better first thing in the morning and progresses throughout the day.  She drinks less than 2 L of fluid per day and does watch her salt intake.  Weight remains stable.  Blood pressures typically in the 130s systolic.  Chronic dyspnea is stable.   No dizziness, presyncope, or syncope.  No progressive orthopnea.   Labs independently reviewed: 07/2022 - Hgb 13.4, PLT 134, TSH normal 08/2021 - BUN 29, serum creatinine 1.16, potassium 4.8 05/2021 - magnesium 2.1 07/2019 - albumin 3.9, AST/ALT normal 12/2017 - TC 155, TG 79, HDL 72, LDL 67  Past Medical History:  Diagnosis Date   Allergy    Arthritis    Breast cancer (HCC)    left breast   Coronary artery disease 2001   CABG   DDD (degenerative disc disease), lumbosacral    Dyspnea    if rushing or climmbing lots of stairs   Emphysema lung (HCC)    emphysema   GERD (gastroesophageal reflux disease)    11/20/16- no a current problem   Hyperlipidemia    Hypertension    Irritable bowel syndrome (IBS)    Lung disorder    leison   Neuropathy    Pinched nerve    back and left leg   Pneumonia 2016    Past Surgical History:  Procedure Laterality Date   BREAST FIBROADENOMA SURGERY  01/13/1980   BREAST SURGERY  02/01/2004   tram/mastectomyfor recurrence   CARPOMETACARPEL SUSPENSION PLASTY Right 12/17/2017   Procedure: SUSPENSIONPLASTY RIGHT THUMB WITH EXCISION TRAPEZIUM;  Surgeon: Cindee Salt, MD;  Location: [facility];  Service: Orthopedics;  Laterality: Right;   COLONOSCOPY     CORONARY ANGIOPLASTY  2001   had tear to two vessels- had to have open heart surgery   CORONARY ARTERY BYPASS GRAFT  2001   EYE SURGERY Bilateral    Cataract   KNEE ARTHROSCOPY  January 2011   right   MASTECTOMY PARTIAL / LUMPECTOMY W/ AXILLARY LYMPHADENECTOMY  04/10/1989   Dr Maple Hudson / left breast   PARTIAL HYSTERECTOMY     vaginal   TENDON TRANSFER Right 12/17/2017   Procedure: ABDUCTOR POLLICIS LONGUS TRANSFER;  Surgeon: Cindee Salt, MD;  Location: [facility];  Service: Orthopedics;  Laterality: Right;   TONSILLECTOMY AND ADENOIDECTOMY     TOTAL HIP ARTHROPLASTY Right 08/05/2016   Procedure: TOTAL HIP ARTHROPLASTY ANTERIOR APPROACH;  Surgeon: Marcene Corning, MD;   Location: [facility];  Service: Orthopedics;  Laterality: Right;   UPPER GASTROINTESTINAL ENDOSCOPY     VIDEO BRONCHOSCOPY WITH ENDOBRONCHIAL NAVIGATION N/A 11/27/2016   Procedure: VIDEO BRONCHOSCOPY WITH ENDOBRONCHIAL NAVIGATION;  Surgeon: Loreli Slot, MD;  Location: [facility] OR;  Service: Thoracic;  Laterality: N/A;    Current Medications: Current Meds  Medication Sig   acetaminophen (TYLENOL) 500 MG tablet Take 1,000 mg by mouth 2 (two) times daily.    albuterol (PROAIR HFA) 108 (90 Base) MCG/ACT inhaler Inhale 1-2 puffs into the lungs every 6 (six) hours as needed for wheezing or shortness of breath.   ANORO ELLIPTA 62.5-25 MCG/ACT AEPB INHALE 1 PUFF BY MOUTH INTO LUNGS BY MOUTH DAILY   aspirin EC 81 MG tablet Take 81 mg by mouth daily.   atorvastatin (LIPITOR) 20 MG tablet Take 1 tablet (20 mg total) by mouth daily.   Biotin 29562 MCG TABS Take 1 tablet by mouth daily.   carvedilol (COREG) 3.125 MG tablet Take 1 tablet (3.125 mg total) by mouth 2 (  two) times daily.   fexofenadine (ALLEGRA) 180 MG tablet Take 180 mg by mouth daily.   fish oil-omega-3 fatty acids 1000 MG capsule Take 1 g by mouth daily.    [START ON 05/06/2023] furosemide (LASIX) 20 MG tablet Take 1 tablet (20 mg total) by mouth every Monday, Wednesday, and Friday.   gabapentin (NEURONTIN) 300 MG capsule Take 300 mg by mouth 4 (four) times daily.    Glucosamine-Chondroit-Vit C-Mn (GLUCOSAMINE CHONDR 1500 COMPLX PO) Take 1 tablet by mouth 2 (two) times daily.   hyoscyamine (LEVBID) 0.375 MG 12 hr tablet Take 0.375 mg by mouth every 12 (twelve) hours as needed for cramping (IBS).    Multiple Vitamin (MULTIVITAMIN) tablet Take 1 tablet by mouth daily.     nitroGLYCERIN (NITROSTAT) 0.4 MG SL tablet Place 0.4 mg under the tongue every 5 (five) minutes as needed for chest pain.   telmisartan (MICARDIS) 40 MG tablet TAKE ONE TABLET BY MOUTH EVERY EVENING    Allergies:   Amlodipine, Hydromorphone, Azithromycin, Ciprofloxacin,  Hydromorphone hcl, Levofloxacin, and Lisinopril   Social History   Socioeconomic History   Marital status: Married    Spouse name: Not on file   Number of children: Not on file   Years of education: Not on file   Highest education level: Not on file  Occupational History   Not on file  Tobacco Use   Smoking status: Former    Current packs/day: 0.00    Types: Cigarettes    Start date: 08/01/1959    Quit date: 07/31/1989    Years since quitting: 33.7   Smokeless tobacco: Never  Vaping Use   Vaping status: Never Used  Substance and Sexual Activity   Alcohol use: Yes    Alcohol/week: 1.0 standard drink of alcohol    Types: 1 Glasses of wine per week    Comment: occasional wine   Drug use: No   Sexual activity: Not on file  Other Topics Concern   Not on file  Social History Narrative   Not on file   Social Determinants of Health   Financial Resource Strain: Not on file  Food Insecurity: Not on file  Transportation Needs: Not on file  Physical Activity: Not on file  Stress: Not on file  Social Connections: Not on file     Family History:  The patient's family history includes Colon cancer in her paternal grandmother, paternal uncle, and son; Emphysema in her father; Heart disease in her mother.  ROS:   12-point review of systems is negative unless otherwise noted in the HPI.   EKGs/Labs/Other Studies Reviewed:    Studies reviewed were summarized above. The additional studies were reviewed today:  2D echo 07/09/2021: 1. Left ventricular ejection fraction, by estimation, is 65 to 70%. The  left ventricle has normal function. The left ventricle has no regional  wall motion abnormalities. Left ventricular diastolic parameters are  consistent with Grade I diastolic  dysfunction (impaired relaxation).   2. Right ventricular systolic function is normal. The right ventricular  size is normal. There is normal pulmonary artery systolic pressure.   3. Left atrial size was  mildly dilated.   4. The mitral valve is normal in structure. Trivial mitral valve  regurgitation. No evidence of mitral stenosis.   5. The aortic valve is tricuspid. Aortic valve regurgitation is mild. No  aortic stenosis is present.   6. The inferior vena cava is normal in size with greater than 50%  respiratory variability, suggesting right atrial  pressure of 3 mmHg.   Comparison(s): LVEF 55-60%. __________   Eugenie Birks MPI 08/05/2019: Pharmacological myocardial perfusion imaging study with no significant  ischemia Normal wall motion, EF estimated at 83% No EKG changes concerning for ischemia at peak stress or in recovery. Resting EKG with T wave ABN in V3 to V6, II, III, AVF.  APCs and PVCs noted CT attenuation corrected images with aortic atherosclerosis, coronary calcification in the LAD Low risk scan __________   2D echo 11/08/2014: - Left ventricle: The cavity size was normal. There was mild    concentric hypertrophy. Systolic function was normal. The    estimated ejection fraction was in the range of 55% to 60%. Wall    motion was normal; there were no regional wall motion    abnormalities. Doppler parameters are consistent with abnormal    left ventricular relaxation (grade 1 diastolic dysfunction). The    E/e&' ratio is between 8-15, suggesting indeterminate LV filling    pressure.  - Aortic valve: Sclerosis without stenosis. Regurgitation pressure    half-time: 574 ms.  - Mitral valve: Mildly thickened leaflets . There was trivial    regurgitation.  - Left atrium: Massively dilated at 133 ml/m2.  - Atrial septum: A septal defect cannot be excluded.  - Tricuspid valve: There was mild regurgitation.  - Pulmonary arteries: PA peak pressure: 40 mm Hg (S).  - Inferior vena cava: The vessel was normal in size. The    respirophasic diameter changes were in the normal range (>= 50%),    consistent with normal central venous pressure.   Impressions:   - LVEF 55-60%, mild  LVH, diastolic dysfunction, indeterminate LV    filling pressure, aortic sclerosis with trivial AI, mild TR, RVSP    40 mmHg, massive left atrial enlargment.   EKG:  EKG is not ordered today.    Recent Labs: 07/15/2022: Hemoglobin 13.4; Platelets 134.0; TSH 2.33  Recent Lipid Panel    Component Value Date/Time   CHOL 155 12/08/2017 1045   TRIG 79 12/08/2017 1045   HDL 72 12/08/2017 1045   CHOLHDL 2.2 12/08/2017 1045   LDLCALC 67 12/08/2017 1045    PHYSICAL EXAM:    VS:  BP (!) 146/86 (BP Location: Left Arm, Patient Position: Sitting, Cuff Size: Normal)   Pulse (!) 53   Ht 5\' 5"  (1.651 m)   Wt 130 lb (59 kg)   LMP  (LMP Unknown)   SpO2 91%   BMI 21.63 kg/m   BMI: Body mass index is 21.63 kg/m.  Physical Exam Vitals reviewed.  Constitutional:      Appearance: She is well-developed.  HENT:     Head: Normocephalic and atraumatic.  Eyes:     General:        Right eye: No discharge.        Left eye: No discharge.  Neck:     Vascular: No JVD.  Cardiovascular:     Rate and Rhythm: Normal rate and regular rhythm.     Pulses:          Posterior tibial pulses are 2+ on the right side and 2+ on the left side.     Heart sounds: Normal heart sounds, S1 normal and S2 normal. Heart sounds not distant. No midsystolic click and no opening snap. No murmur heard.    No friction rub.  Pulmonary:     Effort: Pulmonary effort is normal. No respiratory distress.     Breath sounds: Normal breath sounds. No decreased  breath sounds, wheezing or rales.  Chest:     Chest wall: No tenderness.  Abdominal:     General: There is no distension.     Palpations: Abdomen is soft.     Tenderness: There is no abdominal tenderness.  Musculoskeletal:     Cervical back: Normal range of motion.     Comments: Trivial bilateral pretibial edema.  Skin:    General: Skin is warm and dry.     Nails: There is no clubbing.  Neurological:     Mental Status: She is alert and oriented to person, place,  and time.  Psychiatric:        Speech: Speech normal.        Behavior: Behavior normal.        Thought Content: Thought content normal.        Judgment: Judgment normal.     Wt Readings from Last 3 Encounters:  05/05/23 130 lb (59 kg)  03/03/23 134 lb 12.8 oz (61.1 kg)  02/16/23 131 lb 3.2 oz (59.5 kg)     ASSESSMENT & PLAN:   CAD status post CABG without angina with chronic dyspnea: No symptoms suggestive of angina or cardiac decompensation.  Continue aggressive risk factor modification and secondary prevention including aspirin, atorvastatin, and carvedilol.  Obtain echo.  Labile hypertension: Blood pressure is reasonably controlled in the office.  Continue carvedilol 3.125 mg twice daily along with telmisartan 40 mg daily.  HLD: LDL 67 in 2019.  She remains on atorvastatin 20 mg.  Future orders for fasting lipid panel and CMP have been placed.  Lower extremity swelling: Appears to be consistent with venous insufficiency.  Will continue leg elevation and compression socks.  Reasonable to undergo a trial of furosemide 20 mg Monday, Wednesday, and Fridays.  Follow-up labs in 2 weeks.  With chronic dyspnea and in the setting of lower extremity swelling, obtain echo.    Disposition: F/u with Dr. Okey Dupre or an APP in 2 months.   Medication Adjustments/Labs and Tests Ordered: Current medicines are reviewed at length with the patient today.  Concerns regarding medicines are outlined above. Medication changes, Labs and Tests ordered today are summarized above and listed in the Patient Instructions accessible in Encounters.   Signed, Eula Listen, PA-C 05/05/2023 3:53 PM     [facility] [address] [place], [place] [address] [phone]

## 2023-05-05 NOTE — Patient Instructions (Signed)
Medication Instructions:  Your physician has recommended you make the following change in your medication:   START Furosemide (Lasix) 20 mg Monday, Wednesday and Friday  *If you need a refill on your cardiac medications before your next appointment, please call your pharmacy*   Lab Work: CMET, CBC, and fasting lipid panel to be done in 2 weeks here in our office.   If you have labs (blood work) drawn today and your tests are completely normal, you will receive your results only by: MyChart Message (if you have MyChart) OR A paper copy in the mail If you have any lab test that is abnormal or we need to change your treatment, we will call you to review the results.   Testing/Procedures: Your physician has requested that you have an echocardiogram. Echocardiography is a painless test that uses sound waves to create images of your heart. It provides your doctor with information about the size and shape of your heart and how well your heart's chambers and valves are working. This procedure takes approximately one hour. There are no restrictions for this procedure. Please do NOT wear cologne, perfume, aftershave, or lotions (deodorant is allowed). Please arrive 15 minutes prior to your appointment time.    Follow-Up: At [facility], you and your health needs are our priority.  As part of our continuing mission to provide you with exceptional heart care, we have created designated Provider Care Teams.  These Care Teams include your primary Cardiologist (physician) and Advanced Practice Providers (APPs -  Physician Assistants and Nurse Practitioners) who all work together to provide you with the care you need, when you need it.   Your next appointment:   2 month(s)  Provider:   Yvonne Kendall, MD or Eula Listen, PA-C

## 2023-05-12 ENCOUNTER — Ambulatory Visit: Payer: PPO | Attending: Physician Assistant

## 2023-05-12 DIAGNOSIS — R0609 Other forms of dyspnea: Secondary | ICD-10-CM | POA: Diagnosis not present

## 2023-05-18 ENCOUNTER — Other Ambulatory Visit: Payer: Self-pay | Admitting: Physician Assistant

## 2023-05-18 DIAGNOSIS — I10 Essential (primary) hypertension: Secondary | ICD-10-CM

## 2023-06-01 ENCOUNTER — Other Ambulatory Visit
Admission: RE | Admit: 2023-06-01 | Discharge: 2023-06-01 | Disposition: A | Discharge status: Home / Self Care | Payer: PPO | Attending: Physician Assistant | Admitting: Physician Assistant

## 2023-06-01 DIAGNOSIS — Z951 Presence of aortocoronary bypass graft: Secondary | ICD-10-CM | POA: Insufficient documentation

## 2023-06-01 DIAGNOSIS — R0609 Other forms of dyspnea: Secondary | ICD-10-CM | POA: Insufficient documentation

## 2023-06-01 DIAGNOSIS — Z79899 Other long term (current) drug therapy: Secondary | ICD-10-CM | POA: Diagnosis present

## 2023-06-01 DIAGNOSIS — E785 Hyperlipidemia, unspecified: Secondary | ICD-10-CM | POA: Diagnosis present

## 2023-06-01 DIAGNOSIS — I2510 Atherosclerotic heart disease of native coronary artery without angina pectoris: Secondary | ICD-10-CM | POA: Insufficient documentation

## 2023-06-01 LAB — CBC
HCT: 42.8 % (ref 36.0–46.0)
Hemoglobin: 13.9 g/dL (ref 12.0–15.0)
MCH: 33.1 pg (ref 26.0–34.0)
MCHC: 32.5 g/dL (ref 30.0–36.0)
MCV: 101.9 fL — ABNORMAL HIGH (ref 80.0–100.0)
Platelets: 117 10*3/uL — ABNORMAL LOW (ref 150–400)
RBC: 4.2 MIL/uL (ref 3.87–5.11)
RDW: 13 % (ref 11.5–15.5)
WBC: 4 10*3/uL (ref 4.0–10.5)
nRBC: 0 % (ref 0.0–0.2)

## 2023-06-01 LAB — COMPREHENSIVE METABOLIC PANEL
ALT: 31 U/L (ref 0–44)
AST: 39 U/L (ref 15–41)
Albumin: 3.6 g/dL (ref 3.5–5.0)
Alkaline Phosphatase: 73 U/L (ref 38–126)
Anion gap: 8 (ref 5–15)
BUN: 24 mg/dL — ABNORMAL HIGH (ref 8–23)
CO2: 28 mmol/L (ref 22–32)
Calcium: 10 mg/dL (ref 8.9–10.3)
Chloride: 104 mmol/L (ref 98–111)
Creatinine, Ser: 0.98 mg/dL (ref 0.44–1.00)
GFR, Estimated: 57 mL/min — ABNORMAL LOW (ref >60)
Glucose, Bld: 107 mg/dL — ABNORMAL HIGH (ref 70–99)
Potassium: 4.3 mmol/L (ref 3.5–5.1)
Sodium: 140 mmol/L (ref 135–145)
Total Bilirubin: 0.8 mg/dL (ref 0.3–1.2)
Total Protein: 6.5 g/dL (ref 6.5–8.1)

## 2023-06-01 LAB — LIPID PANEL
Cholesterol: 145 mg/dL (ref 0–200)
HDL: 67 mg/dL (ref >40)
LDL Cholesterol: 67 mg/dL (ref 0–99)
Total CHOL/HDL Ratio: 2.2 RATIO
Triglycerides: 53 mg/dL (ref <150)
VLDL: 11 mg/dL (ref 0–40)

## 2023-06-01 NOTE — Addendum Note (Signed)
Addended by: Yehuda Savannah on: 06/01/2023 09:53 AM   Modules accepted: Orders

## 2023-06-01 NOTE — Addendum Note (Signed)
Addended by: Yehuda Savannah on: 06/01/2023 09:52 AM   Modules accepted: Orders

## 2023-06-09 ENCOUNTER — Encounter: Payer: Self-pay | Admitting: Physician Assistant

## 2023-06-09 ENCOUNTER — Ambulatory Visit: Payer: PPO | Attending: Physician Assistant | Admitting: Physician Assistant

## 2023-06-09 VITALS — BP 128/60 | HR 55 | Ht 65.0 in | Wt 124.0 lb

## 2023-06-09 DIAGNOSIS — Z951 Presence of aortocoronary bypass graft: Secondary | ICD-10-CM | POA: Diagnosis not present

## 2023-06-09 DIAGNOSIS — I272 Pulmonary hypertension, unspecified: Secondary | ICD-10-CM

## 2023-06-09 DIAGNOSIS — R0609 Other forms of dyspnea: Secondary | ICD-10-CM | POA: Diagnosis not present

## 2023-06-09 DIAGNOSIS — M7989 Other specified soft tissue disorders: Secondary | ICD-10-CM

## 2023-06-09 DIAGNOSIS — I25118 Atherosclerotic heart disease of native coronary artery with other forms of angina pectoris: Secondary | ICD-10-CM | POA: Diagnosis not present

## 2023-06-09 DIAGNOSIS — R0989 Other specified symptoms and signs involving the circulatory and respiratory systems: Secondary | ICD-10-CM

## 2023-06-09 DIAGNOSIS — E785 Hyperlipidemia, unspecified: Secondary | ICD-10-CM

## 2023-06-09 NOTE — Patient Instructions (Signed)
Medication Instructions:  Your Physician recommend you continue on your current medication as directed.    *If you need a refill on your cardiac medications before your next appointment, please call your pharmacy*   Lab Work: Your provider would like for you to have following labs drawn today CBC and BMET.   If you have labs (blood work) drawn today and your tests are completely normal, you will receive your results only by: MyChart Message (if you have MyChart) OR A paper copy in the mail If you have any lab test that is abnormal or we need to change your treatment, we will call you to review the results.   Testing/Procedures:  [place] [facility] [place] [name] [place] [address] Dept: [phone] Loc: [phone]  Tiffany Caldwell  06/09/2023  You are scheduled for a Left and Right Cardiac Catheterization on Wednesday, September 11 with Dr. Cristal De[place] End.  1. Please arrive at the [facility] Entrance of [facility], [address], [place] [address] at 10:30 AM (This is 1 hour prior to your procedure time).  Proceed to the Check-In Desk directly inside the entrance.  Procedure Parking: Use the entrance off of the [facility] [address] side of the hospital. Turn right upon entering and follow the driveway to parking that is directly in front of the Heart & Vascular Center. There is no valet parking available at this entrance, however there is an awning directly in front of the Heart & Vascular Center for drop off/ pick up for patients.  Special note: Every effort is made to have your procedure done on time. Please understand that emergencies sometimes delay scheduled procedures.  2. Diet: Do not eat solid foods after midnight.  The patient may have clear liquids until 5am upon the day of the procedure.  3. Labs: You will had blood work drawn today (3 Sept 2024).  4.  Medication instructions in preparation for your procedure:   Contrast Allergy: No    Current Outpatient Medications (Cardiovascular):    atorvastatin (LIPITOR) 20 MG tablet, Take 1 tablet (20 mg total) by mouth daily.   carvedilol (COREG) 3.125 MG tablet, Take 1 tablet (3.125 mg total) by mouth 2 (two) times daily.   furosemide (LASIX) 20 MG tablet, Take 1 tablet (20 mg total) by mouth every Monday, Wednesday, and Friday.   nitroGLYCERIN (NITROSTAT) 0.4 MG SL tablet, Place 0.4 mg under the tongue every 5 (five) minutes as needed for chest pain.   telmisartan (MICARDIS) 40 MG tablet, TAKE ONE TABLET BY MOUTH EVERY EVENING  Current Outpatient Medications (Respiratory):    albuterol (PROAIR HFA) 108 (90 Base) MCG/ACT inhaler, Inhale 1-2 puffs into the lungs every 6 (six) hours as needed for wheezing or shortness of breath.   ANORO ELLIPTA 62.5-25 MCG/ACT AEPB, INHALE 1 PUFF BY MOUTH INTO LUNGS BY MOUTH DAILY   fexofenadine (ALLEGRA) 180 MG tablet, Take 180 mg by mouth daily.  Current Outpatient Medications (Analgesics):    acetaminophen (TYLENOL) 500 MG tablet, Take 1,000 mg by mouth 2 (two) times daily.    aspirin EC 81 MG tablet, Take 81 mg by mouth daily.   Current Outpatient Medications (Other):    Biotin 95188 MCG TABS, Take 1 tablet by mouth daily.   fish oil-omega-3 fatty acids 1000 MG capsule, Take 1 g by mouth daily.    gabapentin (NEURONTIN) 300 MG capsule, Take 300 mg by  mouth 3 (three) times daily.   Glucosamine-Chondroit-Vit C-Mn (GLUCOSAMINE CHONDR 1500 COMPLX PO), Take 1 tablet by mouth 2 (two) times daily.   hyoscyamine (LEVBID) 0.375 MG 12 hr tablet, Take 0.375 mg by mouth every 12 (twelve) hours as needed for cramping (IBS).    Multiple Vitamin (MULTIVITAMIN) tablet, Take 1 tablet by mouth daily.   *For reference purposes while preparing patient instructions.   Delete this med list prior to printing instructions for patient.*   Stop taking, Lasix (Furosemide)   Tuesday, September 2, -- THIS IS THE LAST DOSE UNTIL AFTER YOUR PROCEDURE  On the morning of your procedure, take your Aspirin 81 mg and any morning medicines NOT listed above.  You may use sips of water.   5. Plan to go home the same day, you will only stay overnight if medically necessary. 6. Bring a current list of your medications and current insurance cards. 7. You MUST have a responsible person to drive you home. 8. Someone MUST be with you the first 24 hours after you arrive home or your discharge will be delayed. 9. Please wear clothes that are easy to get on and off and wear slip-on shoes.  Thank you for allowing [place] to care for you!   -- [facility]    Follow-Up: At [facility], you and your health needs are our priority.  As part of our continuing mission to provide you with exceptional heart care, we have created designated Provider Care Teams.  These Care Teams include your primary Cardiologist (physician) and Advanced Practice Providers (APPs -  Physician Assistants and Nurse Practitioners) who all work together to provide you with the care you need, when you need it.  We recommend signing up for the patient portal called "MyChart".  Sign up information is provided on this After Visit Summary.  MyChart is used to connect with patients for Virtual Visits (Telemedicine).  Patients are able to view lab/test results, encounter notes, upcoming appointments, etc.  Non-urgent messages can be sent to your provider as well.   To learn more about what you can do with MyChart, go to [email or web address].    Your next appointment:   After your procedure - 2 weeks  Provider:   You may see Yvonne Kendall, MD or one of the following Advanced Practice Providers on your designated Care Team:   Eula Listen, [place]

## 2023-06-09 NOTE — Progress Notes (Signed)
Cardiology Office Note    Date:  06/09/2023   ID:  [name], [name] 27-Jul-1941, MRN [id number]  PCP:  Kirby Funk, MD (Inactive)  Cardiologist:  Yvonne Kendall, MD  Electrophysiologist:  None   Chief Complaint: Follow up  History of Present Illness:   Tiffany Caldwell is a 82 y.o. female with history of CAD status post remote PCI with subsequent emergent 3-vessel CABG for iatrogenic left main/LAD dissection in 2001, right lower lobe cavitary lung mass, labile HTN, HLD, left-sided breast cancer, DDD, COPD, IBS, and GERD who presents for follow-up of echo.  Prior left heart cath in 12/1999 showed left main normal, LAD with 80 and 90% mid vessel stenosis, LCx without significant disease, RCA with 30% proximal stenosis.  She underwent successful PCI/BMS to the mid LAD.  Repeat cath on 05/28/2000 showed left main normal, LAD with 80 to 90% mid vessel in-stent restenosis, LCx normal, RCA with 30 to 40% proximal stenosis as well as ostial spasm.  Plan was for angioplasty of the previously placed stent though procedure was complicated by proximal LAD dissection with placement of BMS.  Further propagation of the dissection to the left main occurred prompting referral for urgent CABG.  Patient underwent three-vessel CABG with LIMA to LAD, SVG to diagonal, and SVG to OM.  Echo from 11/2014 showed an EF of 55 to 60% without regional wall motion abnormalities, grade 1 diastolic dysfunction, mild concentric LVH, severely dilated left atrium, normal RV size and systolic function, mild tricuspid regurgitation, otherwise no significant valvular abnormalities.  She underwent Lexiscan MPI in 07/2019 after being evaluated in the ED and ruling out with study showing no significant ischemia and was overall low risk.  She underwent echo in 07/2021 due to a slight progression of chronic exertional dyspnea which demonstrated an EF of 65 to 70%, no regional wall motion abnormalities, grade 1 diastolic dysfunction,  normal RV systolic function and ventricular cavity size, normal PASP, mildly dilated left atrium, trivial mitral regurgitation, mild aortic insufficiency, and an estimated right atrial pressure of 3 mmHg.  She was taken off amlodipine in 2022 with noted improvement in fatigue and lower extremity swelling.  She was seen in the office in 10/2021, and continued to note labile blood pressure readings at home, sometimes up to 160 mmHg systolic in the evenings.  She reported 1 episode of hypotension several weeks prior with her blood pressure being around 80/50 at that time.  Her chronic exertional dyspnea was stable.  Medication changes were deferred.  She was seen in the office in 05/2022 and remained without symptoms of angina or cardiac decompensation.  She reported well-controlled blood pressures at home, though was not checking them as frequently as she previously did.  No significant episodes of hyper or hypotension with HCTZ in the morning and telmisartan in the evening.  She was seen in the office in 02/2023 noting outside office discontinued HCTZ secondary to calcium levels.  In this setting, she had been placed on amlodipine.  With this that she noted a return in lower extremity swelling.  Blood pressures were well-controlled at home.  Amlodipine was discontinued and she was started on carvedilol 3.125 mg twice daily with continuation of telmisartan 40 mg.  She was last seen in the office on 05/05/2023 noting improvement in lower extremity swelling following discontinuation of amlodipine.  Chronic dyspnea and weight were stable.  She was started on furosemide 20 mg Monday, Wednesday, and Fridays.  Echo in 05/2023 showed an EF  of 55 to 60%, no regional wall motion abnormalities, severe asymmetric LVH of the basal septal segment without LVOT obstruction, grade 2 diastolic dysfunction, normal RV systolic function and ventricular cavity size and elevated PASP estimated at 59.3 mmHg, severely dilated left atrium, mildly  to moderately dilated right atrium, mild to moderate mitral regurgitation, mild aortic insufficiency, aortic valve sclerosis without evidence of stenosis, and an estimated right atrial pressure of 8 mmHg.  She comes in accompanied by her husband today and is doing well from a cardiac perspective.  She is without symptoms of angina or cardiac decompensation.  She does note a progression of exertional dyspnea and fatigue.  Lower extremity swelling persists, though is improved somewhat following addition of furosemide on Mondays, Wednesdays, and Fridays.  Her weight is down 6 pounds today when compared to her visit on 05/05/2023.  Continues to watch her salt intake and drink less than 2 L of liquid daily.  No progressive orthopnea, abdominal distention or early satiety.   Labs independently reviewed: 05/2023 - TC 145, TG 53, HDL 67, LDL 67, Hgb 13.9, PLT 117, potassium 4.3, BUN 24, serum creatinine 0.98, albumin 3.6, AST/ALT normal 07/2022 - TSH normal  Past Medical History:  Diagnosis Date   Allergy    Arthritis    Breast cancer (HCC)    left breast   Coronary artery disease 2001   CABG   DDD (degenerative disc disease), lumbosacral    Dyspnea    if rushing or climmbing lots of stairs   Emphysema lung (HCC)    emphysema   GERD (gastroesophageal reflux disease)    11/20/16- no a current problem   Hyperlipidemia    Hypertension    Irritable bowel syndrome (IBS)    Lung disorder    leison   Neuropathy    Pinched nerve    back and left leg   Pneumonia 2016    Past Surgical History:  Procedure Laterality Date   BREAST FIBROADENOMA SURGERY  01/13/1980   BREAST SURGERY  02/01/2004   tram/mastectomyfor recurrence   CARPOMETACARPEL SUSPENSION PLASTY Right 12/17/2017   Procedure: SUSPENSIONPLASTY RIGHT THUMB WITH EXCISION TRAPEZIUM;  Surgeon: Cindee Salt, MD;  Location: [facility];  Service: Orthopedics;  Laterality: Right;   COLONOSCOPY     CORONARY ANGIOPLASTY  2001   had  tear to two vessels- had to have open heart surgery   CORONARY ARTERY BYPASS GRAFT  2001   EYE SURGERY Bilateral    Cataract   KNEE ARTHROSCOPY  January 2011   right   MASTECTOMY PARTIAL / LUMPECTOMY W/ AXILLARY LYMPHADENECTOMY  04/10/1989   Dr Maple Hudson / left breast   PARTIAL HYSTERECTOMY     vaginal   TENDON TRANSFER Right 12/17/2017   Procedure: ABDUCTOR POLLICIS LONGUS TRANSFER;  Surgeon: Cindee Salt, MD;  Location: [facility];  Service: Orthopedics;  Laterality: Right;   TONSILLECTOMY AND ADENOIDECTOMY     TOTAL HIP ARTHROPLASTY Right 08/05/2016   Procedure: TOTAL HIP ARTHROPLASTY ANTERIOR APPROACH;  Surgeon: Marcene Corning, MD;  Location: [facility];  Service: Orthopedics;  Laterality: Right;   UPPER GASTROINTESTINAL ENDOSCOPY     VIDEO BRONCHOSCOPY WITH ENDOBRONCHIAL NAVIGATION N/A 11/27/2016   Procedure: VIDEO BRONCHOSCOPY WITH ENDOBRONCHIAL NAVIGATION;  Surgeon: Loreli Slot, MD;  Location: [facility];  Service: Thoracic;  Laterality: N/A;    Current Medications: Current Meds  Medication Sig   acetaminophen (TYLENOL) 500 MG tablet Take 1,000 mg by mouth 2 (two) times daily.    albuterol (  PROAIR HFA) 108 (90 Base) MCG/ACT inhaler Inhale 1-2 puffs into the lungs every 6 (six) hours as needed for wheezing or shortness of breath.   ANORO ELLIPTA 62.5-25 MCG/ACT AEPB INHALE 1 PUFF BY MOUTH INTO LUNGS BY MOUTH DAILY   aspirin EC 81 MG tablet Take 81 mg by mouth daily.   atorvastatin (LIPITOR) 20 MG tablet Take 1 tablet (20 mg total) by mouth daily.   Biotin 16109 MCG TABS Take 1 tablet by mouth daily.   carvedilol (COREG) 3.125 MG tablet Take 1 tablet (3.125 mg total) by mouth 2 (two) times daily.   fexofenadine (ALLEGRA) 180 MG tablet Take 180 mg by mouth daily.   fish oil-omega-3 fatty acids 1000 MG capsule Take 1 g by mouth daily.    furosemide (LASIX) 20 MG tablet Take 1 tablet (20 mg total) by mouth every Monday, Wednesday, and Friday.   gabapentin (NEURONTIN) 300 MG  capsule Take 300 mg by mouth 3 (three) times daily.   Glucosamine-Chondroit-Vit C-Mn (GLUCOSAMINE CHONDR 1500 COMPLX PO) Take 1 tablet by mouth 2 (two) times daily.   hyoscyamine (LEVBID) 0.375 MG 12 hr tablet Take 0.375 mg by mouth every 12 (twelve) hours as needed for cramping (IBS).    Multiple Vitamin (MULTIVITAMIN) tablet Take 1 tablet by mouth daily.     nitroGLYCERIN (NITROSTAT) 0.4 MG SL tablet Place 0.4 mg under the tongue every 5 (five) minutes as needed for chest pain.   telmisartan (MICARDIS) 40 MG tablet TAKE ONE TABLET BY MOUTH EVERY EVENING    Allergies:   Amlodipine, Hydromorphone, Azithromycin, Ciprofloxacin, Hydromorphone hcl, Levofloxacin, and Lisinopril   Social History   Socioeconomic History   Marital status: Married    Spouse name: Not on file   Number of children: Not on file   Years of education: Not on file   Highest education level: Not on file  Occupational History   Not on file  Tobacco Use   Smoking status: Former    Current packs/day: 0.00    Types: Cigarettes    Start date: 08/01/1959    Quit date: 07/31/1989    Years since quitting: 33.8   Smokeless tobacco: Never  Vaping Use   Vaping status: Never Used  Substance and Sexual Activity   Alcohol use: Yes    Alcohol/week: 1.0 standard drink of alcohol    Types: 1 Glasses of wine per week    Comment: occasional wine   Drug use: No   Sexual activity: Not on file  Other Topics Concern   Not on file  Social History Narrative   Not on file   Social Determinants of Health   Financial Resource Strain: Not on file  Food Insecurity: Not on file  Transportation Needs: Not on file  Physical Activity: Not on file  Stress: Not on file  Social Connections: Not on file     Family History:  The patient's family history includes Colon cancer in her paternal grandmother, paternal uncle, and son; Emphysema in her father; Heart disease in her mother.  ROS:   12-point review of systems is negative  unless otherwise noted in the HPI.   EKGs/Labs/Other Studies Reviewed:    Studies reviewed were summarized above. The additional studies were reviewed today:  2D echo 05/12/2023: 1. No LVOT obstruction.. Left ventricular ejection fraction, by  estimation, is 55 to 60%. The left ventricle has normal function. The left  ventricle has no regional wall motion abnormalities. There is severe  asymmetric left ventricular hypertrophy of  the basal-septal segment. Left ventricular diastolic parameters are  consistent with Grade II diastolic dysfunction (pseudonormalization).   2. Right ventricular systolic function is low normal. The right  ventricular size is normal. There is moderately elevated pulmonary artery  systolic pressure.   3. Left atrial size was severely dilated.   4. Right atrial size was mild to moderately dilated.   5. The mitral valve is normal in structure. Mild to moderate mitral valve  regurgitation.   6. The aortic valve is tricuspid. Aortic valve regurgitation is mild.  Aortic valve sclerosis is present, with no evidence of aortic valve  stenosis.   7. The inferior vena cava is dilated in size with >50% respiratory  variability, suggesting right atrial pressure of 8 mmHg.  __________  2D echo 07/09/2021: 1. Left ventricular ejection fraction, by estimation, is 65 to 70%. The  left ventricle has normal function. The left ventricle has no regional  wall motion abnormalities. Left ventricular diastolic parameters are  consistent with Grade I diastolic  dysfunction (impaired relaxation).   2. Right ventricular systolic function is normal. The right ventricular  size is normal. There is normal pulmonary artery systolic pressure.   3. Left atrial size was mildly dilated.   4. The mitral valve is normal in structure. Trivial mitral valve  regurgitation. No evidence of mitral stenosis.   5. The aortic valve is tricuspid. Aortic valve regurgitation is mild. No  aortic  stenosis is present.   6. The inferior vena cava is normal in size with greater than 50%  respiratory variability, suggesting right atrial pressure of 3 mmHg.   Comparison(s): LVEF 55-60%. __________   Eugenie Birks MPI 08/05/2019: Pharmacological myocardial perfusion imaging study with no significant  ischemia Normal wall motion, EF estimated at 83% No EKG changes concerning for ischemia at peak stress or in recovery. Resting EKG with T wave ABN in V3 to V6, II, III, AVF.  APCs and PVCs noted CT attenuation corrected images with aortic atherosclerosis, coronary calcification in the LAD Low risk scan __________   2D echo 11/08/2014: - Left ventricle: The cavity size was normal. There was mild    concentric hypertrophy. Systolic function was normal. The    estimated ejection fraction was in the range of 55% to 60%. Wall    motion was normal; there were no regional wall motion    abnormalities. Doppler parameters are consistent with abnormal    left ventricular relaxation (grade 1 diastolic dysfunction). The    E/e&' ratio is between 8-15, suggesting indeterminate LV filling    pressure.  - Aortic valve: Sclerosis without stenosis. Regurgitation pressure    half-time: 574 ms.  - Mitral valve: Mildly thickened leaflets . There was trivial    regurgitation.  - Left atrium: Massively dilated at 133 ml/m2.  - Atrial septum: A septal defect cannot be excluded.  - Tricuspid valve: There was mild regurgitation.  - Pulmonary arteries: PA peak pressure: 40 mm Hg (S).  - Inferior vena cava: The vessel was normal in size. The    respirophasic diameter changes were in the normal range (>= 50%),    consistent with normal central venous pressure.   Impressions:   - LVEF 55-60%, mild LVH, diastolic dysfunction, indeterminate LV    filling pressure, aortic sclerosis with trivial AI, mild TR, RVSP    40 mmHg, massive left atrial enlargment.   EKG:  EKG is not ordered today.    Recent  Labs: 07/15/2022: TSH 2.33 06/01/2023: ALT 31; BUN 24;  Creatinine, Ser 0.98; Hemoglobin 13.9; Platelets 117; Potassium 4.3; Sodium 140  Recent Lipid Panel    Component Value Date/Time   CHOL 145 06/01/2023 1018   CHOL 155 12/08/2017 1045   TRIG 53 06/01/2023 1018   HDL 67 06/01/2023 1018   HDL 72 12/08/2017 1045   CHOLHDL 2.2 06/01/2023 1018   VLDL 11 06/01/2023 1018   LDLCALC 67 06/01/2023 1018   LDLCALC 67 12/08/2017 1045    PHYSICAL EXAM:    VS:  BP 128/60 (BP Location: Left Arm, Patient Position: Sitting, Cuff Size: Small)   Pulse (!) 55   Ht 5\' 5"  (1.651 m)   Wt 124 lb (56.2 kg)   LMP  (LMP Unknown)   SpO2 97%   BMI 20.63 kg/m   BMI: Body mass index is 20.63 kg/m.  Physical Exam Vitals reviewed.  Constitutional:      Appearance: She is well-developed.  HENT:     Head: Normocephalic and atraumatic.  Eyes:     General:        Right eye: No discharge.        Left eye: No discharge.  Neck:     Vascular: No JVD.  Cardiovascular:     Rate and Rhythm: Normal rate and regular rhythm.     Heart sounds: Normal heart sounds, S1 normal and S2 normal. Heart sounds not distant. No midsystolic click and no opening snap. No murmur heard.    No friction rub.  Pulmonary:     Effort: Pulmonary effort is normal. No respiratory distress.     Breath sounds: Normal breath sounds. No decreased breath sounds, wheezing or rales.  Chest:     Chest wall: No tenderness.  Abdominal:     General: There is no distension.  Musculoskeletal:     Cervical back: Normal range of motion.     Right lower leg: Edema present.     Left lower leg: Edema present.     Comments: Trivial bilateral pretibial edema.  Skin:    General: Skin is warm and dry.     Nails: There is no clubbing.  Neurological:     Mental Status: She is alert and oriented to person, place, and time.  Psychiatric:        Speech: Speech normal.        Behavior: Behavior normal.        Thought Content: Thought content  normal.        Judgment: Judgment normal.     Wt Readings from Last 3 Encounters:  06/09/23 124 lb (56.2 kg)  05/05/23 130 lb (59 kg)  03/03/23 134 lb 12.8 oz (61.1 kg)     ASSESSMENT & PLAN:   CAD status post CABG without angina with chronic dyspnea and pulmonary hypertension: Currently without symptoms of chest pain.  Most recent echo demonstrated normal LV systolic function, though this is down from echo in 2022 (cannot exclude vigorous LV function at that time in the setting of possible dehydration) with significant asymmetric LVH of the basal septal segment, and newly diagnosed moderate pulmonary hypertension of uncertain etiology.  Schedule R/LHC.  Continue aggressive risk factor modification and current medical therapy including aspirin, atorvastatin, carvedilol, telmisartan, and as needed SL NTG.  She remains on furosemide 20 mg Monday, Wednesday, and Friday.  Labile hypertension: Blood pressure is well-controlled in the office today.  HLD: LDL 67 in 05/2023.  Continue atorvastatin.  Lower extremity swelling: Improving with leg elevation, compression socks, and furosemide.  Check BMP  today.  COPD: Stable.  Prior tobacco use of a little over 30 years, quitting in 1990.   Informed Consent   Shared Decision Making/Informed Consent{  The risks [stroke (1 in 1000), death (1 in 1000), kidney failure [usually temporary] (1 in 500), bleeding (1 in 200), allergic reaction [possibly serious] (1 in 200)], benefits (diagnostic support and management of coronary artery disease) and alternatives of a cardiac catheterization were discussed in detail with Ms. Patrone and she is willing to proceed.        Disposition: F/u with Dr. Okey Dupre or an APP in 1-2 weeks after [facility]   Medication Adjustments/Labs and Tests Ordered: Current medicines are reviewed at length with the patient today.  Concerns regarding medicines are outlined above. Medication changes, Labs and Tests ordered today are  summarized above and listed in the Patient Instructions accessible in Encounters.   Signed, Eula Listen, PA-C 06/09/2023 4:52 PM      [facility] [address] [place], [place] [address] [phone]

## 2023-06-09 NOTE — H&P (View-Only) (Signed)
Cardiology Office Note    Date:  06/09/2023   ID:  Tiffany Caldwell, Tiffany Caldwell 27-Jul-1941, MRN [id number]  PCP:  Kirby Funk, MD (Inactive)  Cardiologist:  Yvonne Kendall, MD  Electrophysiologist:  None   Chief Complaint: Follow up  History of Present Illness:   Tiffany Caldwell is a 82 y.o. female with history of CAD status post remote PCI with subsequent emergent 3-vessel CABG for iatrogenic left main/LAD dissection in 2001, right lower lobe cavitary lung mass, labile HTN, HLD, left-sided breast cancer, DDD, COPD, IBS, and GERD who presents for follow-up of echo.  Prior left heart cath in 12/1999 showed left main normal, LAD with 80 and 90% mid vessel stenosis, LCx without significant disease, RCA with 30% proximal stenosis.  She underwent successful PCI/BMS to the mid LAD.  Repeat cath on 05/28/2000 showed left main normal, LAD with 80 to 90% mid vessel in-stent restenosis, LCx normal, RCA with 30 to 40% proximal stenosis as well as ostial spasm.  Plan was for angioplasty of the previously placed stent though procedure was complicated by proximal LAD dissection with placement of BMS.  Further propagation of the dissection to the left main occurred prompting referral for urgent CABG.  Patient underwent three-vessel CABG with LIMA to LAD, SVG to diagonal, and SVG to OM.  Echo from 11/2014 showed an EF of 55 to 60% without regional wall motion abnormalities, grade 1 diastolic dysfunction, mild concentric LVH, severely dilated left atrium, normal RV size and systolic function, mild tricuspid regurgitation, otherwise no significant valvular abnormalities.  She underwent Lexiscan MPI in 07/2019 after being evaluated in the ED and ruling out with study showing no significant ischemia and was overall low risk.  She underwent echo in 07/2021 due to a slight progression of chronic exertional dyspnea which demonstrated an EF of 65 to 70%, no regional wall motion abnormalities, grade 1 diastolic dysfunction,  normal RV systolic function and ventricular cavity size, normal PASP, mildly dilated left atrium, trivial mitral regurgitation, mild aortic insufficiency, and an estimated right atrial pressure of 3 mmHg.  She was taken off amlodipine in 2022 with noted improvement in fatigue and lower extremity swelling.  She was seen in the office in 10/2021, and continued to note labile blood pressure readings at home, sometimes up to 160 mmHg systolic in the evenings.  She reported 1 episode of hypotension several weeks prior with her blood pressure being around 80/50 at that time.  Her chronic exertional dyspnea was stable.  Medication changes were deferred.  She was seen in the office in 05/2022 and remained without symptoms of angina or cardiac decompensation.  She reported well-controlled blood pressures at home, though was not checking them as frequently as she previously did.  No significant episodes of hyper or hypotension with HCTZ in the morning and telmisartan in the evening.  She was seen in the office in 02/2023 noting outside office discontinued HCTZ secondary to calcium levels.  In this setting, she had been placed on amlodipine.  With this that she noted a return in lower extremity swelling.  Blood pressures were well-controlled at home.  Amlodipine was discontinued and she was started on carvedilol 3.125 mg twice daily with continuation of telmisartan 40 mg.  She was last seen in the office on 05/05/2023 noting improvement in lower extremity swelling following discontinuation of amlodipine.  Chronic dyspnea and weight were stable.  She was started on furosemide 20 mg Monday, Wednesday, and Fridays.  Echo in 05/2023 showed an EF  of 55 to 60%, no regional wall motion abnormalities, severe asymmetric LVH of the basal septal segment without LVOT obstruction, grade 2 diastolic dysfunction, normal RV systolic function and ventricular cavity size and elevated PASP estimated at 59.3 mmHg, severely dilated left atrium, mildly  to moderately dilated right atrium, mild to moderate mitral regurgitation, mild aortic insufficiency, aortic valve sclerosis without evidence of stenosis, and an estimated right atrial pressure of 8 mmHg.  She comes in accompanied by her husband today and is doing well from a cardiac perspective.  She is without symptoms of angina or cardiac decompensation.  She does note a progression of exertional dyspnea and fatigue.  Lower extremity swelling persists, though is improved somewhat following addition of furosemide on Mondays, Wednesdays, and Fridays.  Her weight is down 6 pounds today when compared to her visit on 05/05/2023.  Continues to watch her salt intake and drink less than 2 L of liquid daily.  No progressive orthopnea, abdominal distention or early satiety.   Labs independently reviewed: 05/2023 - TC 145, TG 53, HDL 67, LDL 67, Hgb 13.9, PLT 117, potassium 4.3, BUN 24, serum creatinine 0.98, albumin 3.6, AST/ALT normal 07/2022 - TSH normal  Past Medical History:  Diagnosis Date   Allergy    Arthritis    Breast cancer (HCC)    left breast   Coronary artery disease 2001   CABG   DDD (degenerative disc disease), lumbosacral    Dyspnea    if rushing or climmbing lots of stairs   Emphysema lung (HCC)    emphysema   GERD (gastroesophageal reflux disease)    11/20/16- no a current problem   Hyperlipidemia    Hypertension    Irritable bowel syndrome (IBS)    Lung disorder    leison   Neuropathy    Pinched nerve    back and left leg   Pneumonia 2016    Past Surgical History:  Procedure Laterality Date   BREAST FIBROADENOMA SURGERY  01/13/1980   BREAST SURGERY  02/01/2004   tram/mastectomyfor recurrence   CARPOMETACARPEL SUSPENSION PLASTY Right 12/17/2017   Procedure: SUSPENSIONPLASTY RIGHT THUMB WITH EXCISION TRAPEZIUM;  Surgeon: Cindee Salt, MD;  Location: [facility];  Service: Orthopedics;  Laterality: Right;   COLONOSCOPY     CORONARY ANGIOPLASTY  2001   had  tear to two vessels- had to have open heart surgery   CORONARY ARTERY BYPASS GRAFT  2001   EYE SURGERY Bilateral    Cataract   KNEE ARTHROSCOPY  January 2011   right   MASTECTOMY PARTIAL / LUMPECTOMY W/ AXILLARY LYMPHADENECTOMY  04/10/1989   Dr Maple Hudson / left breast   PARTIAL HYSTERECTOMY     vaginal   TENDON TRANSFER Right 12/17/2017   Procedure: ABDUCTOR POLLICIS LONGUS TRANSFER;  Surgeon: Cindee Salt, MD;  Location: [facility];  Service: Orthopedics;  Laterality: Right;   TONSILLECTOMY AND ADENOIDECTOMY     TOTAL HIP ARTHROPLASTY Right 08/05/2016   Procedure: TOTAL HIP ARTHROPLASTY ANTERIOR APPROACH;  Surgeon: Marcene Corning, MD;  Location: [facility];  Service: Orthopedics;  Laterality: Right;   UPPER GASTROINTESTINAL ENDOSCOPY     VIDEO BRONCHOSCOPY WITH ENDOBRONCHIAL NAVIGATION N/A 11/27/2016   Procedure: VIDEO BRONCHOSCOPY WITH ENDOBRONCHIAL NAVIGATION;  Surgeon: Loreli Slot, MD;  Location: [facility];  Service: Thoracic;  Laterality: N/A;    Current Medications: Current Meds  Medication Sig   acetaminophen (TYLENOL) 500 MG tablet Take 1,000 mg by mouth 2 (two) times daily.    albuterol (  PROAIR HFA) 108 (90 Base) MCG/ACT inhaler Inhale 1-2 puffs into the lungs every 6 (six) hours as needed for wheezing or shortness of breath.   ANORO ELLIPTA 62.5-25 MCG/ACT AEPB INHALE 1 PUFF BY MOUTH INTO LUNGS BY MOUTH DAILY   aspirin EC 81 MG tablet Take 81 mg by mouth daily.   atorvastatin (LIPITOR) 20 MG tablet Take 1 tablet (20 mg total) by mouth daily.   Biotin 16109 MCG TABS Take 1 tablet by mouth daily.   carvedilol (COREG) 3.125 MG tablet Take 1 tablet (3.125 mg total) by mouth 2 (two) times daily.   fexofenadine (ALLEGRA) 180 MG tablet Take 180 mg by mouth daily.   fish oil-omega-3 fatty acids 1000 MG capsule Take 1 g by mouth daily.    furosemide (LASIX) 20 MG tablet Take 1 tablet (20 mg total) by mouth every Monday, Wednesday, and Friday.   gabapentin (NEURONTIN) 300 MG  capsule Take 300 mg by mouth 3 (three) times daily.   Glucosamine-Chondroit-Vit C-Mn (GLUCOSAMINE CHONDR 1500 COMPLX PO) Take 1 tablet by mouth 2 (two) times daily.   hyoscyamine (LEVBID) 0.375 MG 12 hr tablet Take 0.375 mg by mouth every 12 (twelve) hours as needed for cramping (IBS).    Multiple Vitamin (MULTIVITAMIN) tablet Take 1 tablet by mouth daily.     nitroGLYCERIN (NITROSTAT) 0.4 MG SL tablet Place 0.4 mg under the tongue every 5 (five) minutes as needed for chest pain.   telmisartan (MICARDIS) 40 MG tablet TAKE ONE TABLET BY MOUTH EVERY EVENING    Allergies:   Amlodipine, Hydromorphone, Azithromycin, Ciprofloxacin, Hydromorphone hcl, Levofloxacin, and Lisinopril   Social History   Socioeconomic History   Marital status: Married    Spouse name: Not on file   Number of children: Not on file   Years of education: Not on file   Highest education level: Not on file  Occupational History   Not on file  Tobacco Use   Smoking status: Former    Current packs/day: 0.00    Types: Cigarettes    Start date: 08/01/1959    Quit date: 07/31/1989    Years since quitting: 33.8   Smokeless tobacco: Never  Vaping Use   Vaping status: Never Used  Substance and Sexual Activity   Alcohol use: Yes    Alcohol/week: 1.0 standard drink of alcohol    Types: 1 Glasses of wine per week    Comment: occasional wine   Drug use: No   Sexual activity: Not on file  Other Topics Concern   Not on file  Social History Narrative   Not on file   Social Determinants of Health   Financial Resource Strain: Not on file  Food Insecurity: Not on file  Transportation Needs: Not on file  Physical Activity: Not on file  Stress: Not on file  Social Connections: Not on file     Family History:  The patient's family history includes Colon cancer in her paternal grandmother, paternal uncle, and son; Emphysema in her father; Heart disease in her mother.  ROS:   12-point review of systems is negative  unless otherwise noted in the HPI.   EKGs/Labs/Other Studies Reviewed:    Studies reviewed were summarized above. The additional studies were reviewed today:  2D echo 05/12/2023: 1. No LVOT obstruction.. Left ventricular ejection fraction, by  estimation, is 55 to 60%. The left ventricle has normal function. The left  ventricle has no regional wall motion abnormalities. There is severe  asymmetric left ventricular hypertrophy of  the basal-septal segment. Left ventricular diastolic parameters are  consistent with Grade II diastolic dysfunction (pseudonormalization).   2. Right ventricular systolic function is low normal. The right  ventricular size is normal. There is moderately elevated pulmonary artery  systolic pressure.   3. Left atrial size was severely dilated.   4. Right atrial size was mild to moderately dilated.   5. The mitral valve is normal in structure. Mild to moderate mitral valve  regurgitation.   6. The aortic valve is tricuspid. Aortic valve regurgitation is mild.  Aortic valve sclerosis is present, with no evidence of aortic valve  stenosis.   7. The inferior vena cava is dilated in size with >50% respiratory  variability, suggesting right atrial pressure of 8 mmHg.  __________  2D echo 07/09/2021: 1. Left ventricular ejection fraction, by estimation, is 65 to 70%. The  left ventricle has normal function. The left ventricle has no regional  wall motion abnormalities. Left ventricular diastolic parameters are  consistent with Grade I diastolic  dysfunction (impaired relaxation).   2. Right ventricular systolic function is normal. The right ventricular  size is normal. There is normal pulmonary artery systolic pressure.   3. Left atrial size was mildly dilated.   4. The mitral valve is normal in structure. Trivial mitral valve  regurgitation. No evidence of mitral stenosis.   5. The aortic valve is tricuspid. Aortic valve regurgitation is mild. No  aortic  stenosis is present.   6. The inferior vena cava is normal in size with greater than 50%  respiratory variability, suggesting right atrial pressure of 3 mmHg.   Comparison(s): LVEF 55-60%. __________   Eugenie Birks MPI 08/05/2019: Pharmacological myocardial perfusion imaging study with no significant  ischemia Normal wall motion, EF estimated at 83% No EKG changes concerning for ischemia at peak stress or in recovery. Resting EKG with T wave ABN in V3 to V6, II, III, AVF.  APCs and PVCs noted CT attenuation corrected images with aortic atherosclerosis, coronary calcification in the LAD Low risk scan __________   2D echo 11/08/2014: - Left ventricle: The cavity size was normal. There was mild    concentric hypertrophy. Systolic function was normal. The    estimated ejection fraction was in the range of 55% to 60%. Wall    motion was normal; there were no regional wall motion    abnormalities. Doppler parameters are consistent with abnormal    left ventricular relaxation (grade 1 diastolic dysfunction). The    E/e&' ratio is between 8-15, suggesting indeterminate LV filling    pressure.  - Aortic valve: Sclerosis without stenosis. Regurgitation pressure    half-time: 574 ms.  - Mitral valve: Mildly thickened leaflets . There was trivial    regurgitation.  - Left atrium: Massively dilated at 133 ml/m2.  - Atrial septum: A septal defect cannot be excluded.  - Tricuspid valve: There was mild regurgitation.  - Pulmonary arteries: PA peak pressure: 40 mm Hg (S).  - Inferior vena cava: The vessel was normal in size. The    respirophasic diameter changes were in the normal range (>= 50%),    consistent with normal central venous pressure.   Impressions:   - LVEF 55-60%, mild LVH, diastolic dysfunction, indeterminate LV    filling pressure, aortic sclerosis with trivial AI, mild TR, RVSP    40 mmHg, massive left atrial enlargment.   EKG:  EKG is not ordered today.    Recent  Labs: 07/15/2022: TSH 2.33 06/01/2023: ALT 31; BUN 24;  Creatinine, Ser 0.98; Hemoglobin 13.9; Platelets 117; Potassium 4.3; Sodium 140  Recent Lipid Panel    Component Value Date/Time   CHOL 145 06/01/2023 1018   CHOL 155 12/08/2017 1045   TRIG 53 06/01/2023 1018   HDL 67 06/01/2023 1018   HDL 72 12/08/2017 1045   CHOLHDL 2.2 06/01/2023 1018   VLDL 11 06/01/2023 1018   LDLCALC 67 06/01/2023 1018   LDLCALC 67 12/08/2017 1045    PHYSICAL EXAM:    VS:  BP 128/60 (BP Location: Left Arm, Patient Position: Sitting, Cuff Size: Small)   Pulse (!) 55   Ht 5\' 5"  (1.651 m)   Wt 124 lb (56.2 kg)   LMP  (LMP Unknown)   SpO2 97%   BMI 20.63 kg/m   BMI: Body mass index is 20.63 kg/m.  Physical Exam Vitals reviewed.  Constitutional:      Appearance: She is well-developed.  HENT:     Head: Normocephalic and atraumatic.  Eyes:     General:        Right eye: No discharge.        Left eye: No discharge.  Neck:     Vascular: No JVD.  Cardiovascular:     Rate and Rhythm: Normal rate and regular rhythm.     Heart sounds: Normal heart sounds, S1 normal and S2 normal. Heart sounds not distant. No midsystolic click and no opening snap. No murmur heard.    No friction rub.  Pulmonary:     Effort: Pulmonary effort is normal. No respiratory distress.     Breath sounds: Normal breath sounds. No decreased breath sounds, wheezing or rales.  Chest:     Chest wall: No tenderness.  Abdominal:     General: There is no distension.  Musculoskeletal:     Cervical back: Normal range of motion.     Right lower leg: Edema present.     Left lower leg: Edema present.     Comments: Trivial bilateral pretibial edema.  Skin:    General: Skin is warm and dry.     Nails: There is no clubbing.  Neurological:     Mental Status: She is alert and oriented to person, place, and time.  Psychiatric:        Speech: Speech normal.        Behavior: Behavior normal.        Thought Content: Thought content  normal.        Judgment: Judgment normal.     Wt Readings from Last 3 Encounters:  06/09/23 124 lb (56.2 kg)  05/05/23 130 lb (59 kg)  03/03/23 134 lb 12.8 oz (61.1 kg)     ASSESSMENT & PLAN:   CAD status post CABG without angina with chronic dyspnea and pulmonary hypertension: Currently without symptoms of chest pain.  Most recent echo demonstrated normal LV systolic function, though this is down from echo in 2022 (cannot exclude vigorous LV function at that time in the setting of possible dehydration) with significant asymmetric LVH of the basal septal segment, and newly diagnosed moderate pulmonary hypertension of uncertain etiology.  Schedule R/LHC.  Continue aggressive risk factor modification and current medical therapy including aspirin, atorvastatin, carvedilol, telmisartan, and as needed SL NTG.  She remains on furosemide 20 mg Monday, Wednesday, and Friday.  Labile hypertension: Blood pressure is well-controlled in the office today.  HLD: LDL 67 in 05/2023.  Continue atorvastatin.  Lower extremity swelling: Improving with leg elevation, compression socks, and furosemide.  Check BMP  today.  COPD: Stable.  Prior tobacco use of a little over 30 years, quitting in 1990.   Informed Consent   Shared Decision Making/Informed Consent{  The risks [stroke (1 in 1000), death (1 in 1000), kidney failure [usually temporary] (1 in 500), bleeding (1 in 200), allergic reaction [possibly serious] (1 in 200)], benefits (diagnostic support and management of coronary artery disease) and alternatives of a cardiac catheterization were discussed in detail with Tiffany Caldwell and she is willing to proceed.        Disposition: F/u with Dr. Okey Dupre or an APP in 1-2 weeks after [facility]   Medication Adjustments/Labs and Tests Ordered: Current medicines are reviewed at length with the patient today.  Concerns regarding medicines are outlined above. Medication changes, Labs and Tests ordered today are  summarized above and listed in the Patient Instructions accessible in Encounters.   Signed, Eula Listen, PA-C 06/09/2023 4:52 PM      [facility] [address] [place], [place] [address] [phone]

## 2023-06-10 ENCOUNTER — Telehealth: Payer: Self-pay | Admitting: Emergency Medicine

## 2023-06-10 ENCOUNTER — Other Ambulatory Visit
Admission: RE | Admit: 2023-06-10 | Discharge: 2023-06-10 | Disposition: A | Discharge status: Home / Self Care | Payer: PPO | Attending: Physician Assistant | Admitting: Physician Assistant

## 2023-06-10 ENCOUNTER — Other Ambulatory Visit: Payer: Self-pay

## 2023-06-10 ENCOUNTER — Telehealth: Payer: Self-pay | Admitting: Internal Medicine

## 2023-06-10 DIAGNOSIS — Z79899 Other long term (current) drug therapy: Secondary | ICD-10-CM

## 2023-06-10 DIAGNOSIS — I272 Pulmonary hypertension, unspecified: Secondary | ICD-10-CM

## 2023-06-10 DIAGNOSIS — Z951 Presence of aortocoronary bypass graft: Secondary | ICD-10-CM

## 2023-06-10 LAB — BASIC METABOLIC PANEL
Anion gap: 8 (ref 5–15)
BUN/Creatinine Ratio: 26 (ref 12–28)
BUN: 27 mg/dL (ref 8–27)
BUN: 27 mg/dL — ABNORMAL HIGH (ref 8–23)
CO2: 29 mmol/L (ref 20–29)
CO2: 29 mmol/L (ref 22–32)
Calcium: 11.5 mg/dL — ABNORMAL HIGH (ref 8.7–10.3)
Calcium: 10.8 mg/dL — ABNORMAL HIGH (ref 8.9–10.3)
Chloride: 102 mmol/L (ref 96–106)
Chloride: 102 mmol/L (ref 98–111)
Creatinine, Ser: 1.02 mg/dL — ABNORMAL HIGH (ref 0.57–1.00)
Creatinine, Ser: 1.05 mg/dL — ABNORMAL HIGH (ref 0.44–1.00)
GFR, Estimated: 53 mL/min — ABNORMAL LOW (ref >60)
Glucose, Bld: 85 mg/dL (ref 70–99)
Glucose: 72 mg/dL (ref 70–99)
Potassium: 5.4 mmol/L — ABNORMAL HIGH (ref 3.5–5.2)
Potassium: 5.3 mmol/L — ABNORMAL HIGH (ref 3.5–5.1)
Sodium: 142 mmol/L (ref 134–144)
Sodium: 139 mmol/L (ref 135–145)
eGFR: 55 mL/min/{1.73_m2} — ABNORMAL LOW (ref >59)

## 2023-06-10 LAB — CBC
Hematocrit: 41.1 % (ref 34.0–46.6)
Hemoglobin: 13.8 g/dL (ref 11.1–15.9)
MCH: 32.6 pg (ref 26.6–33.0)
MCHC: 33.6 g/dL (ref 31.5–35.7)
MCV: 97 fL (ref 79–97)
Platelets: 121 10*3/uL — ABNORMAL LOW (ref 150–450)
RBC: 4.23 x10E6/uL (ref 3.77–5.28)
RDW: 12.3 % (ref 11.7–15.4)
WBC: 4.4 10*3/uL (ref 3.4–10.8)

## 2023-06-10 NOTE — Telephone Encounter (Signed)
Called and spoke with patient. Informed her of the following from Eula Listen, PA-C.  Blood count normal with stable mildly low platelet count Kidney function stable Calcium elevated Potassium elevated   Recommendations: -Please have the patient come to the medical mall for a stat BMP this morning -Moving forward, she will need to follow-up with her PCP if she has not already done so for further evaluation of persistent elevated calcium level.  Patient verbalizes understanding. Patient states that she will come have her labs drawn today. Orders placed for BMP.

## 2023-06-10 NOTE — Addendum Note (Signed)
Addended by: Jani Gravel on: 06/10/2023 01:22 PM   Modules accepted: Orders

## 2023-06-10 NOTE — Telephone Encounter (Signed)
Caller Juliette Alcide) stated patient is at Registration in the [facility] and is looking for orders to have her repeat lab done.

## 2023-06-10 NOTE — Telephone Encounter (Signed)
New order placed for BMP

## 2023-06-10 NOTE — Telephone Encounter (Signed)
-----   Message from [employer] sent at 06/10/2023  6:50 AM EDT ----- Blood count normal with stable mildly low platelet count Kidney function stable Calcium elevated Potassium elevated  Recommendations: -Please have the patient come to the medical mall for a stat BMP this morning -Moving forward, she will need to follow-up with her PCP if she has not already done so for further evaluation of persistent elevated calcium level

## 2023-06-12 ENCOUNTER — Other Ambulatory Visit
Admission: RE | Admit: 2023-06-12 | Discharge: 2023-06-12 | Disposition: A | Discharge status: Home / Self Care | Payer: PPO | Attending: Physician Assistant | Admitting: Physician Assistant

## 2023-06-12 DIAGNOSIS — Z79899 Other long term (current) drug therapy: Secondary | ICD-10-CM | POA: Diagnosis present

## 2023-06-12 DIAGNOSIS — I272 Pulmonary hypertension, unspecified: Secondary | ICD-10-CM | POA: Diagnosis present

## 2023-06-12 DIAGNOSIS — Z951 Presence of aortocoronary bypass graft: Secondary | ICD-10-CM | POA: Insufficient documentation

## 2023-06-12 LAB — BASIC METABOLIC PANEL
Anion gap: 4 — ABNORMAL LOW (ref 5–15)
BUN: 29 mg/dL — ABNORMAL HIGH (ref 8–23)
CO2: 30 mmol/L (ref 22–32)
Calcium: 10.2 mg/dL (ref 8.9–10.3)
Chloride: 105 mmol/L (ref 98–111)
Creatinine, Ser: 0.87 mg/dL (ref 0.44–1.00)
GFR, Estimated: >60 mL/min (ref >60)
Glucose, Bld: 72 mg/dL (ref 70–99)
Potassium: 5 mmol/L (ref 3.5–5.1)
Sodium: 139 mmol/L (ref 135–145)

## 2023-06-13 ENCOUNTER — Telehealth: Payer: Self-pay | Admitting: Cardiology

## 2023-06-13 NOTE — Telephone Encounter (Signed)
Patient called in reporting her blood pressure was elevated this morning in the 160s. Reports her ARB was recently stopped in the setting of hyperkalemia. She moved and just found her BP cuff this morning so this is first reading since stopping ARB. I instructed that if BP remains elevated she can increase her coreg from 3.125mg  BID to 6.25mg  BID as she reports her HR has been in the 70s. She voiced understanding and thanked me for callback.

## 2023-06-15 ENCOUNTER — Telehealth: Payer: Self-pay | Admitting: Internal Medicine

## 2023-06-15 NOTE — Telephone Encounter (Signed)
Pt called stating she take her lasix every MWF. However, she reported she was instructed to stop lasix on 06/08/23 and to not resume until after cath procedure. Pt stated she forgot and took her lasix today. Pt inquiring if she ok to proceed with procedure.  Will forward to [place] to make aware

## 2023-06-15 NOTE — Telephone Encounter (Signed)
Pt c/o medication issue:  1. Name of Medication: furosemide (LASIX) 20 MG tablet   2. How are you currently taking this medication (dosage and times per day)?    3. Are you having a reaction (difficulty breathing--STAT)? no  4. What is your medication issue? Patient forgot she wasn't suppose to take medication. Prior to surgery but she took one this morning! Calling to see if that's okay. Please advise

## 2023-06-15 NOTE — Group Note (Deleted)

## 2023-06-15 NOTE — Telephone Encounter (Signed)
Pt made aware and verbalized understanding.

## 2023-06-15 NOTE — Telephone Encounter (Signed)
Yes, the should be okay given normalization of kidney function on follow-up BMP.

## 2023-06-16 ENCOUNTER — Telehealth: Payer: Self-pay | Admitting: *Deleted

## 2023-06-16 NOTE — Telephone Encounter (Signed)
Spoke with patient and reviewed instructions for her scheduled procedure tomorrow. We discussed instructions, time, location, and answered all of her questions. She was appreciative for the call with no further needs at this time.

## 2023-06-17 ENCOUNTER — Other Ambulatory Visit: Payer: Self-pay

## 2023-06-17 ENCOUNTER — Encounter: Payer: Self-pay | Admitting: Internal Medicine

## 2023-06-17 ENCOUNTER — Encounter
Admission: RE | Disposition: A | Discharge status: Home / Self Care | Payer: Self-pay | Source: Home / Self Care | Attending: Internal Medicine

## 2023-06-17 ENCOUNTER — Ambulatory Visit
Admission: RE | Admit: 2023-06-17 | Discharge: 2023-06-17 | Disposition: A | Discharge status: Home / Self Care | Payer: PPO | Attending: Internal Medicine | Admitting: Internal Medicine

## 2023-06-17 DIAGNOSIS — Z853 Personal history of malignant neoplasm of breast: Secondary | ICD-10-CM | POA: Insufficient documentation

## 2023-06-17 DIAGNOSIS — R0609 Other forms of dyspnea: Secondary | ICD-10-CM | POA: Insufficient documentation

## 2023-06-17 DIAGNOSIS — M7989 Other specified soft tissue disorders: Secondary | ICD-10-CM | POA: Diagnosis not present

## 2023-06-17 DIAGNOSIS — Z87891 Personal history of nicotine dependence: Secondary | ICD-10-CM | POA: Insufficient documentation

## 2023-06-17 DIAGNOSIS — I25119 Atherosclerotic heart disease of native coronary artery with unspecified angina pectoris: Secondary | ICD-10-CM | POA: Insufficient documentation

## 2023-06-17 DIAGNOSIS — K219 Gastro-esophageal reflux disease without esophagitis: Secondary | ICD-10-CM | POA: Insufficient documentation

## 2023-06-17 DIAGNOSIS — E785 Hyperlipidemia, unspecified: Secondary | ICD-10-CM | POA: Insufficient documentation

## 2023-06-17 DIAGNOSIS — Z951 Presence of aortocoronary bypass graft: Secondary | ICD-10-CM | POA: Insufficient documentation

## 2023-06-17 DIAGNOSIS — J449 Chronic obstructive pulmonary disease, unspecified: Secondary | ICD-10-CM | POA: Insufficient documentation

## 2023-06-17 DIAGNOSIS — K589 Irritable bowel syndrome without diarrhea: Secondary | ICD-10-CM | POA: Diagnosis not present

## 2023-06-17 DIAGNOSIS — I110 Hypertensive heart disease with heart failure: Secondary | ICD-10-CM | POA: Diagnosis not present

## 2023-06-17 DIAGNOSIS — Z79899 Other long term (current) drug therapy: Secondary | ICD-10-CM | POA: Diagnosis not present

## 2023-06-17 DIAGNOSIS — I2582 Chronic total occlusion of coronary artery: Secondary | ICD-10-CM | POA: Diagnosis not present

## 2023-06-17 DIAGNOSIS — I509 Heart failure, unspecified: Secondary | ICD-10-CM | POA: Insufficient documentation

## 2023-06-17 DIAGNOSIS — I25118 Atherosclerotic heart disease of native coronary artery with other forms of angina pectoris: Secondary | ICD-10-CM

## 2023-06-17 DIAGNOSIS — Z955 Presence of coronary angioplasty implant and graft: Secondary | ICD-10-CM | POA: Insufficient documentation

## 2023-06-17 DIAGNOSIS — I2510 Atherosclerotic heart disease of native coronary artery without angina pectoris: Secondary | ICD-10-CM | POA: Diagnosis present

## 2023-06-17 DIAGNOSIS — I272 Pulmonary hypertension, unspecified: Secondary | ICD-10-CM

## 2023-06-17 HISTORY — PX: RIGHT/LEFT HEART CATH AND CORONARY ANGIOGRAPHY: CATH118266

## 2023-06-17 LAB — POCT I-STAT 7, (LYTES, BLD GAS, ICA,H+H)
Acid-Base Excess: 1 mmol/L (ref 0.0–2.0)
Bicarbonate: 26.4 mmol/L (ref 20.0–28.0)
Calcium, Ion: 1.34 mmol/L (ref 1.15–1.40)
HCT: 36 % (ref 36.0–46.0)
Hemoglobin: 12.2 g/dL (ref 12.0–15.0)
O2 Saturation: 93 %
Potassium: 4.2 mmol/L (ref 3.5–5.1)
Sodium: 141 mmol/L (ref 135–145)
TCO2: 28 mmol/L (ref 22–32)
pCO2 arterial: 42.4 mmHg (ref 32–48)
pH, Arterial: 7.403 (ref 7.35–7.45)
pO2, Arterial: 67 mmHg — ABNORMAL LOW (ref 83–108)

## 2023-06-17 LAB — POCT I-STAT EG7
Acid-Base Excess: 3 mmol/L — ABNORMAL HIGH (ref 0.0–2.0)
Bicarbonate: 28.8 mmol/L — ABNORMAL HIGH (ref 20.0–28.0)
Calcium, Ion: 1.4 mmol/L (ref 1.15–1.40)
HCT: 37 % (ref 36.0–46.0)
Hemoglobin: 12.6 g/dL (ref 12.0–15.0)
O2 Saturation: 69 %
Potassium: 4.4 mmol/L (ref 3.5–5.1)
Sodium: 140 mmol/L (ref 135–145)
TCO2: 30 mmol/L (ref 22–32)
pCO2, Ven: 49.5 mmHg (ref 44–60)
pH, Ven: 7.373 (ref 7.25–7.43)
pO2, Ven: 37 mmHg (ref 32–45)

## 2023-06-17 SURGERY — RIGHT/LEFT HEART CATH AND CORONARY ANGIOGRAPHY
Anesthesia: Moderate Sedation | Laterality: Bilateral

## 2023-06-17 MED ORDER — VERAPAMIL HCL 2.5 MG/ML IV SOLN
INTRAVENOUS | Status: DC | PRN
  Administered 2023-06-17: 2.5 mg via INTRA_ARTERIAL

## 2023-06-17 MED ORDER — HEPARIN (PORCINE) IN NACL 1000-0.9 UT/500ML-% IV SOLN
INTRAVENOUS | Status: DC | PRN
  Administered 2023-06-17: 1000 mL

## 2023-06-17 MED ORDER — ONDANSETRON HCL 4 MG/2ML IJ SOLN: 4.0000 mg | Freq: Four times a day (QID) | INTRAMUSCULAR | Status: DC | PRN

## 2023-06-17 MED ORDER — HYDRALAZINE HCL 20 MG/ML IJ SOLN: 10.0000 mg | INTRAMUSCULAR | Status: DC | PRN

## 2023-06-17 MED ORDER — SODIUM CHLORIDE 0.9 % IV SOLN: INTRAVENOUS | Status: DC

## 2023-06-17 MED ORDER — VERAPAMIL HCL 2.5 MG/ML IV SOLN
INTRAVENOUS | Status: AC
  Filled 2023-06-17: qty 2

## 2023-06-17 MED ORDER — FENTANYL CITRATE (PF) 100 MCG/2ML IJ SOLN
INTRAMUSCULAR | Status: AC
  Filled 2023-06-17: qty 2

## 2023-06-17 MED ORDER — FENTANYL CITRATE (PF) 100 MCG/2ML IJ SOLN
INTRAMUSCULAR | Status: DC | PRN
  Administered 2023-06-17: 25 ug via INTRAVENOUS

## 2023-06-17 MED ORDER — ASPIRIN 81 MG PO CHEW: 81.0000 mg | CHEWABLE_TABLET | ORAL | Status: DC

## 2023-06-17 MED ORDER — HEPARIN SODIUM (PORCINE) 1000 UNIT/ML IJ SOLN
INTRAMUSCULAR | Status: AC
  Filled 2023-06-17: qty 10

## 2023-06-17 MED ORDER — ACETAMINOPHEN 325 MG PO TABS: 650.0000 mg | ORAL_TABLET | ORAL | Status: DC | PRN

## 2023-06-17 MED ORDER — SODIUM CHLORIDE 0.9 % IV SOLN: 250.0000 mL | INTRAVENOUS | Status: DC | PRN

## 2023-06-17 MED ORDER — SODIUM CHLORIDE 0.9% FLUSH: 3.0000 mL | INTRAVENOUS | Status: DC | PRN

## 2023-06-17 MED ORDER — IOHEXOL 300 MG/ML  SOLN
INTRAMUSCULAR | Status: DC | PRN
  Administered 2023-06-17: 40 mL

## 2023-06-17 MED ORDER — HEPARIN (PORCINE) IN NACL 1000-0.9 UT/500ML-% IV SOLN
INTRAVENOUS | Status: AC
  Filled 2023-06-17: qty 500

## 2023-06-17 MED ORDER — SODIUM CHLORIDE 0.9% FLUSH: 3.0000 mL | Freq: Two times a day (BID) | INTRAVENOUS | Status: DC

## 2023-06-17 MED ORDER — LABETALOL HCL 5 MG/ML IV SOLN: 10.0000 mg | INTRAVENOUS | Status: DC | PRN

## 2023-06-17 MED ORDER — MIDAZOLAM HCL 2 MG/2ML IJ SOLN
INTRAMUSCULAR | Status: AC
  Filled 2023-06-17: qty 2

## 2023-06-17 MED ORDER — HEPARIN SODIUM (PORCINE) 1000 UNIT/ML IJ SOLN
INTRAMUSCULAR | Status: DC | PRN
  Administered 2023-06-17: 3000 [IU] via INTRAVENOUS

## 2023-06-17 SURGICAL SUPPLY — 13 items
CATH BALLN WEDGE 5F 110CM (CATHETERS) IMPLANT
CATH INFINITI 5 FR IM (CATHETERS) IMPLANT
CATH INFINITI 5 FR MPA2 (CATHETERS) IMPLANT
CATH INFINITI 5FR MULTPACK ANG (CATHETERS) IMPLANT
DEVICE RAD TR BAND REGULAR (VASCULAR PRODUCTS) IMPLANT
DRAPE BRACHIAL (DRAPES) IMPLANT
GLIDESHEATH SLEND SS 6F .021 (SHEATH) IMPLANT
GUIDEWIRE .025 260CM (WIRE) IMPLANT
GUIDEWIRE INQWIRE 1.5J.035X260 (WIRE) IMPLANT
INQWIRE 1.5J .035X260CM (WIRE) ×1
PACK CARDIAC CATH (CUSTOM PROCEDURE TRAY) ×1 IMPLANT
SET ATX-X65L (MISCELLANEOUS) IMPLANT
SHEATH GLIDE SLENDER 4/5FR (SHEATH) IMPLANT

## 2023-06-17 NOTE — Interval H&P Note (Signed)
History and Physical Interval Note:  06/17/2023 12:35 PM  Tiffany Caldwell  has presented today for surgery, with the diagnosis of dyspnea on exertion, pulmonary hypertension, and coronary artery disease.  The various methods of treatment have been discussed with the patient and family. After consideration of risks, benefits and other options for treatment, the patient has consented to  Procedure(s): RIGHT/LEFT HEART CATH AND CORONARY ANGIOGRAPHY (Bilateral) as a surgical intervention.  The patient's history has been reviewed, patient examined, no change in status, stable for surgery.  I have reviewed the patient's chart and labs.  Questions were answered to the patient's satisfaction.    Cath Lab Visit (complete for each Cath Lab visit)  Clinical Evaluation Leading to the Procedure:   ACS: No.  Non-ACS:    Anginal/Heart Failure Classification: NYHA class II  Anti-ischemic medical therapy: Minimal Therapy (1 class of medications)  Non-Invasive Test Results: No non-invasive testing performed  Prior CABG: Previous CABG  Tiffany Caldwell

## 2023-06-18 ENCOUNTER — Encounter: Payer: Self-pay | Admitting: Internal Medicine

## 2023-06-19 ENCOUNTER — Other Ambulatory Visit: Payer: Self-pay | Admitting: Emergency Medicine

## 2023-06-19 ENCOUNTER — Telehealth: Payer: Self-pay | Admitting: Physician Assistant

## 2023-06-19 NOTE — Telephone Encounter (Signed)
Spoke to patient and informed her of the provider's recommendations as follows:  "Ok to stop Lasix given normal left and right heart filling pressures noted on cardiac cath this week."  Patient understood with read back

## 2023-06-19 NOTE — Telephone Encounter (Signed)
Ok to stop Lasix given normal left and right heart filling pressures noted on cardiac cath this week.

## 2023-06-19 NOTE — Telephone Encounter (Signed)
Pt c/o medication issue:  1. Name of Medication: Furosemide  2. How are you currently taking this medication (dosage and times per day)?   3. Are you having a reaction (difficulty breathing--STAT)?   4. What is your medication issue? Patient wants to know if she can stop taking the Furosemide

## 2023-06-29 NOTE — Progress Notes (Deleted)
Cardiology Office Note    Date:  06/29/2023   ID:  Tiffany Caldwell, Tiffany Caldwell December 19, 1940, MRN [id number]  PCP:  Delma Officer, PA  Cardiologist:  Yvonne Kendall, MD  Electrophysiologist:  None   Chief Complaint: Follow-up  History of Present Illness:   Tiffany Caldwell is a 82 y.o. female with history of ***  R/LHC on 06/17/2023 showed severe single-vessel CAD with CTO of proximal/mid LAD stent, with mid/distal LAD supplied by patent LIMA graft.  There was also 60 to 70% stenosis at the ostium of OM1, 50% mid LCx stenosis, and 30% proximal RCA stenosis.  Widely patent LIMA to LAD.  Chronically occluded SVG to D1 and SVG to OM1.  Normal left and right heart filling pressures, mild to moderate pulmonary hypertension, and normal cardiac output/index.  ***   Labs independently reviewed: 06/2023 - potassium 5.0, BUN 29, serum creatinine 0.7, Hgb 13.8, PLT 121 05/2023 - TC 145, TG 53, HDL 67, LDL 67, albumin 3.6, AST/ALT normal 07/2022 - TSH normal  Past Medical History:  Diagnosis Date   Allergy    Arthritis    Breast cancer (HCC)    left breast   Coronary artery disease 2001   CABG   DDD (degenerative disc disease), lumbosacral    Dyspnea    if rushing or climmbing lots of stairs   Emphysema lung (HCC)    emphysema   GERD (gastroesophageal reflux disease)    11/20/16- no a current problem   Hyperlipidemia    Hypertension    Irritable bowel syndrome (IBS)    Lung disorder    leison   Neuropathy    Pinched nerve    back and left leg   Pneumonia 2016    Past Surgical History:  Procedure Laterality Date   BREAST FIBROADENOMA SURGERY  01/13/1980   BREAST SURGERY  02/01/2004   tram/mastectomyfor recurrence   CARPOMETACARPEL SUSPENSION PLASTY Right 12/17/2017   Procedure: SUSPENSIONPLASTY RIGHT THUMB WITH EXCISION TRAPEZIUM;  Surgeon: Cindee Salt, MD;  Location: [facility];  Service: Orthopedics;  Laterality: Right;   COLONOSCOPY     CORONARY ANGIOPLASTY   2001   had tear to two vessels- had to have open heart surgery   CORONARY ARTERY BYPASS GRAFT  2001   EYE SURGERY Bilateral    Cataract   KNEE ARTHROSCOPY  January 2011   right   MASTECTOMY PARTIAL / LUMPECTOMY W/ AXILLARY LYMPHADENECTOMY  04/10/1989   Dr Maple Hudson / left breast   PARTIAL HYSTERECTOMY     vaginal   RIGHT/LEFT HEART CATH AND CORONARY ANGIOGRAPHY Bilateral 06/17/2023   Procedure: RIGHT/LEFT HEART CATH AND CORONARY ANGIOGRAPHY;  Surgeon: Yvonne Kendall, MD;  Location: [facility];  Service: Cardiovascular;  Laterality: Bilateral;   TENDON TRANSFER Right 12/17/2017   Procedure: ABDUCTOR POLLICIS LONGUS TRANSFER;  Surgeon: Cindee Salt, MD;  Location: [facility];  Service: Orthopedics;  Laterality: Right;   TONSILLECTOMY AND ADENOIDECTOMY     TOTAL HIP ARTHROPLASTY Right 08/05/2016   Procedure: TOTAL HIP ARTHROPLASTY ANTERIOR APPROACH;  Surgeon: Marcene Corning, MD;  Location: [facility] OR;  Service: Orthopedics;  Laterality: Right;   UPPER GASTROINTESTINAL ENDOSCOPY     VIDEO BRONCHOSCOPY WITH ENDOBRONCHIAL NAVIGATION N/A 11/27/2016   Procedure: VIDEO BRONCHOSCOPY WITH ENDOBRONCHIAL NAVIGATION;  Surgeon: Loreli Slot, MD;  Location: [facility] OR;  Service: Thoracic;  Laterality: N/A;    Current Medications: No outpatient medications have been marked as taking for the 07/03/23 encounter (Appointment) with Eula Listen  M, PA-C.    Allergies:   Amlodipine, Hydromorphone, Azithromycin, Ciprofloxacin, Hydromorphone hcl, Levofloxacin, and Lisinopril   Social History   Socioeconomic History   Marital status: Married    Spouse name: Not on file   Number of children: Not on file   Years of education: Not on file   Highest education level: Not on file  Occupational History   Not on file  Tobacco Use   Smoking status: Former    Current packs/day: 0.00    Types: Cigarettes    Start date: 08/01/1959    Quit date: 07/31/1989    Years since quitting: 33.9    Smokeless tobacco: Never  Vaping Use   Vaping status: Never Used  Substance and Sexual Activity   Alcohol use: Yes    Alcohol/week: 1.0 standard drink of alcohol    Types: 1 Glasses of wine per week    Comment: occasional wine   Drug use: No   Sexual activity: Not on file  Other Topics Concern   Not on file  Social History Narrative   Not on file   Social Determinants of Health   Financial Resource Strain: Not on file  Food Insecurity: Not on file  Transportation Needs: Not on file  Physical Activity: Not on file  Stress: Not on file  Social Connections: Not on file     Family History:  The patient's family history includes Colon cancer in her paternal grandmother, paternal uncle, and son; Emphysema in her father; Heart disease in her mother.  ROS:   12-point review of systems is negative unless otherwise noted in the HPI.   EKGs/Labs/Other Studies Reviewed:    Studies reviewed were summarized above. The additional studies were reviewed today:  [facility] 06/17/2023: Conclusions: Severe single-vessel coronary artery disease with chronic total occlusion of proximal/mid LAD stent, with mid/distal LAD supplied by patent LIMA graft.  There is also 60-70% stenosis at the ostium of OM1, 50% mid LCx disease, and 30% proximal RCA stenosis. Widely patent LIMA-LAD. Chronically occluded SVG-D1 and SVG-OM1. Normal left and right heart filling pressures (LVEDP 14 mmHg, mean RA 3 mmHg, RVEDP 6 mmHg). Mild to moderate pulmonary hypertension (PA 50/14, mean 26 mmHg). Normal Fick cardiac output/index (CO 5.3 L/min, CI 3.2 L/min/m).   Recommendations: Continue follow-up with pulmonology for management of chronic lung disease that is most likely driving Ms. Tiffany Caldwell's chronic exertional dyspnea and mild to moderate pulmonary hypertension.  If symptoms persist, 1 could consider cardiac MRI to better evaluate severe asymmetric left ventricular hypertrophy as well as consultation with the advanced  heart failure service. Aggressive secondary prevention of coronary artery disease.  Favor medical management of 60-70% ostial OM1 and 50% mid LCx disease. __________  2D echo 05/12/2023: 1. No LVOT obstruction.. Left ventricular ejection fraction, by  estimation, is 55 to 60%. The left ventricle has normal function. The left  ventricle has no regional wall motion abnormalities. There is severe  asymmetric left ventricular hypertrophy of  the basal-septal segment. Left ventricular diastolic parameters are  consistent with Grade II diastolic dysfunction (pseudonormalization).   2. Right ventricular systolic function is low normal. The right  ventricular size is normal. There is moderately elevated pulmonary artery  systolic pressure.   3. Left atrial size was severely dilated.   4. Right atrial size was mild to moderately dilated.   5. The mitral valve is normal in structure. Mild to moderate mitral valve  regurgitation.   6. The aortic valve is tricuspid. Aortic valve regurgitation  is mild.  Aortic valve sclerosis is present, with no evidence of aortic valve  stenosis.   7. The inferior vena cava is dilated in size with >50% respiratory  variability, suggesting right atrial pressure of 8 mmHg.  __________   2D echo 07/09/2021: 1. Left ventricular ejection fraction, by estimation, is 65 to 70%. The  left ventricle has normal function. The left ventricle has no regional  wall motion abnormalities. Left ventricular diastolic parameters are  consistent with Grade I diastolic  dysfunction (impaired relaxation).   2. Right ventricular systolic function is normal. The right ventricular  size is normal. There is normal pulmonary artery systolic pressure.   3. Left atrial size was mildly dilated.   4. The mitral valve is normal in structure. Trivial mitral valve  regurgitation. No evidence of mitral stenosis.   5. The aortic valve is tricuspid. Aortic valve regurgitation is mild. No  aortic  stenosis is present.   6. The inferior vena cava is normal in size with greater than 50%  respiratory variability, suggesting right atrial pressure of 3 mmHg.   Comparison(s): LVEF 55-60%. __________   [facility] 08/05/2019: Pharmacological myocardial perfusion imaging study with no significant  ischemia Normal wall motion, EF estimated at 83% No EKG changes concerning for ischemia at peak stress or in recovery. Resting EKG with T wave ABN in V3 to V6, II, III, AVF.  APCs and PVCs noted CT attenuation corrected images with aortic atherosclerosis, coronary calcification in the LAD Low risk scan __________   2D echo 11/08/2014: - Left ventricle: The cavity size was normal. There was mild    concentric hypertrophy. Systolic function was normal. The    estimated ejection fraction was in the range of 55% to 60%. Wall    motion was normal; there were no regional wall motion    abnormalities. Doppler parameters are consistent with abnormal    left ventricular relaxation (grade 1 diastolic dysfunction). The    E/e&' ratio is between 8-15, suggesting indeterminate LV filling    pressure.  - Aortic valve: Sclerosis without stenosis. Regurgitation pressure    half-time: 574 ms.  - Mitral valve: Mildly thickened leaflets . There was trivial    regurgitation.  - Left atrium: Massively dilated at 133 ml/m2.  - Atrial septum: A septal defect cannot be excluded.  - Tricuspid valve: There was mild regurgitation.  - Pulmonary arteries: PA peak pressure: 40 mm Hg (S).  - Inferior vena cava: The vessel was normal in size. The    respirophasic diameter changes were in the normal range (>= 50%),    consistent with normal central venous pressure.   Impressions:   - LVEF 55-60%, mild LVH, diastolic dysfunction, indeterminate LV    filling pressure, aortic sclerosis with trivial AI, mild TR, RVSP    40 mmHg, massive left atrial enlargment.   EKG:  EKG is ordered today.  The EKG ordered today  demonstrates ***  Recent Labs: 07/15/2022: TSH 2.33 06/01/2023: ALT 31 06/09/2023: Platelets 121 06/12/2023: BUN 29; Creatinine, Ser 0.87 06/17/2023: Hemoglobin 12.2; Potassium 4.2; Sodium 141  Recent Lipid Panel    Component Value Date/Time   CHOL 145 06/01/2023 1018   CHOL 155 12/08/2017 1045   TRIG 53 06/01/2023 1018   HDL 67 06/01/2023 1018   HDL 72 12/08/2017 1045   CHOLHDL 2.2 06/01/2023 1018   VLDL 11 06/01/2023 1018   LDLCALC 67 06/01/2023 1018   LDLCALC 67 12/08/2017 1045    PHYSICAL EXAM:  VS:  LMP  (LMP Unknown)   BMI: There is no height or weight on file to calculate BMI.  Physical Exam  Wt Readings from Last 3 Encounters:  06/17/23 130 lb (59 kg)  06/09/23 124 lb (56.2 kg)  05/05/23 130 lb (59 kg)     ASSESSMENT & PLAN:   ***   {Are you ordering a CV Procedure (e.g. stress test, cath, DCCV, TEE, etc)?   Press F2        :[id number]}     Disposition: F/u with Dr. Okey Dupre or an APP in ***.   Medication Adjustments/Labs and Tests Ordered: Current medicines are reviewed at length with the patient today.  Concerns regarding medicines are outlined above. Medication changes, Labs and Tests ordered today are summarized above and listed in the Patient Instructions accessible in Encounters.   Signed, Eula Listen, PA-C 06/29/2023 4:13 PM     [facility] [place] [address] [place], [place] [address] [phone]

## 2023-07-03 ENCOUNTER — Ambulatory Visit: Payer: PPO | Admitting: Physician Assistant

## 2023-07-07 ENCOUNTER — Ambulatory Visit: Payer: PPO

## 2023-07-07 ENCOUNTER — Ambulatory Visit: Payer: PPO | Attending: Physician Assistant | Admitting: Medical

## 2023-07-07 ENCOUNTER — Encounter: Payer: Self-pay | Admitting: Medical

## 2023-07-07 VITALS — BP 104/50 | HR 66 | Ht 65.0 in | Wt 123.0 lb

## 2023-07-07 DIAGNOSIS — I493 Ventricular premature depolarization: Secondary | ICD-10-CM

## 2023-07-07 DIAGNOSIS — Z951 Presence of aortocoronary bypass graft: Secondary | ICD-10-CM

## 2023-07-07 DIAGNOSIS — I517 Cardiomegaly: Secondary | ICD-10-CM

## 2023-07-07 DIAGNOSIS — I272 Pulmonary hypertension, unspecified: Secondary | ICD-10-CM

## 2023-07-07 DIAGNOSIS — J449 Chronic obstructive pulmonary disease, unspecified: Secondary | ICD-10-CM

## 2023-07-07 DIAGNOSIS — I25118 Atherosclerotic heart disease of native coronary artery with other forms of angina pectoris: Secondary | ICD-10-CM | POA: Diagnosis not present

## 2023-07-07 LAB — HEMOGLOBIN AND HEMATOCRIT, BLOOD
Hematocrit: 42.3 % (ref 34.0–46.6)
Hemoglobin: 13.9 g/dL (ref 11.1–15.9)

## 2023-07-07 NOTE — Patient Instructions (Addendum)
Medication Instructions:  Your physician recommends that you continue on your current medications as directed. Please refer to the Current Medication list given to you today.   *If you need a refill on your cardiac medications before your next appointment, please call your pharmacy*   Lab Work: Your provider would like for you to have following labs drawn today (Mg, TSH).     Testing/Procedures: Your physician has recommended that you wear a 14 day Zio monitor.   This monitor is a medical device that records the heart's electrical activity. Doctors most often use these monitors to diagnose arrhythmias. Arrhythmias are problems with the speed or rhythm of the heartbeat. The monitor is a small device applied to your chest. You can wear one while you do your normal daily activities. While wearing this monitor if you have any symptoms to push the button and record what you felt. Once you have worn this monitor for the period of time provider prescribed (Usually 14 days), you will return the monitor device in the postage paid box. Once it is returned they will download the data collected and provide [place] with a report which the provider will then review and we will call you with those results. Important tips:  Avoid showering during the first 24 hours of wearing the monitor. Avoid excessive sweating to help maximize wear time. Do not submerge the device, no hot tubs, and no swimming pools. Keep any lotions or oils away from the patch. After 24 hours you may shower with the patch on. Take brief showers with your back facing the shower head.  Do not remove patch once it has been placed because that will interrupt data and decrease adhesive wear time. Push the button when you have any symptoms and write down what you were feeling. Once you have completed wearing your monitor, remove and place into box which has postage paid and place in your outgoing mailbox.  If for some reason you have misplaced your  box then call our office and we can provide another box and/or mail it off for you.     You are scheduled for Cardiac MRI. Please arrive for your appointment  30-45 minutes prior to test start time. ?  [facility] [address] [place], [place] [address] [phone] Please take advantage of the free valet parking available at the MAIN entrance. Proceed to [facility] registration for check-in (first floor).  Magnetic resonance imaging (MRI) is a painless test that produces images of the inside of the body without using Xrays.  During an MRI, strong magnets and radio waves work together in a Data processing manager to form detailed images.   MRI images may provide more details about a medical condition than X-rays, CT scans, and ultrasounds can provide.  You may be given earphones to listen for instructions.  You may eat a light breakfast and take medications as ordered with the exception of HCTZ (fluid pill, other). Please avoid stimulants for 12 hr prior to test. (Ie. Caffeine, nicotine, chocolate, or antihistamine medications)  If a contrast material will be used, an IV will be inserted into one of your veins. Contrast material will be injected into your IV. It will leave your body through your urine within a day. You may be told to drink plenty of fluids to help flush the contrast material out of your system.  You will be asked to remove all metal, including: Watch, jewelry, and other metal objects including hearing aids, hair pieces and  dentures. Also wearable glucose monitoring systems (ie. Freestyle Libre and Omnipods) (Braces and fillings normally are not a problem.)   TEST WILL TAKE APPROXIMATELY 1 HOUR  PLEASE NOTIFY SCHEDULING AT LEAST 24 HOURS IN ADVANCE IF YOU ARE UNABLE TO KEEP YOUR APPOINTMENT. [phone]  Please call Rockwell Alexandria, cardiac imaging nurse navigator with any questions/concerns. Rockwell Alexandria RN Navigator Cardiac Imaging Larey Brick RN  Navigator Cardiac Imaging [facility] [phone] Office     Follow-Up: At [facility], you and your health needs are our priority.  As part of our continuing mission to provide you with exceptional heart care, we have created designated Provider Care Teams.  These Care Teams include your primary Cardiologist (physician) and Advanced Practice Providers (APPs -  Physician Assistants and Nurse Practitioners) who all work together to provide you with the care you need, when you need it.  We recommend signing up for the patient portal called "MyChart".  Sign up information is provided on this After Visit Summary.  MyChart is used to connect with patients for Virtual Visits (Telemedicine).  Patients are able to view lab/test results, encounter notes, upcoming appointments, etc.  Non-urgent messages can be sent to your provider as well.   To learn more about what you can do with MyChart, go to [email or web address].    Your next appointment:   2 month(s)  Provider:   You may see Yvonne Kendall, MD or one of the following Advanced Practice Providers on your designated Care Team:   Nicolasa Ducking, NP Eula Listen, PA-C Cadence Fransico Michael, PA-C Charlsie Quest, NP

## 2023-07-07 NOTE — Progress Notes (Unsigned)
Cardiology Office Note:    Date:  07/07/2023   ID:  Tiffany Caldwell, DOB 1941-05-22, MRN [id number]  PCP:  Delma Officer, PA  [facility] Cardiologist:  Yvonne Kendall, MD  [facility] Electrophysiologist:  None   Referring MD: Delma Officer, [place]   Chief Complaint: heart cath follow-up  History of Present Illness:    Tiffany Caldwell is a 82 y.o. female with a hx of CAD status post remote PCI with subsequent emergent three-vessel CABG for iatrogenic left main/LAD dissection in 2001, right lower lobe cavitary lung mass, labile hypertension, hyperlipidemia, left-sided breast cancer, COPD, IBS, GERD who presents for follow-up.  In 2001 patient underwent CABG x 3 with LIMA to LAD, SVG to diagonal, and SVG to OM.  Echo from 2016 showed EF of 55 to 60%, grade 1 diastolic dysfunction.  Lexiscan in 2020 showed no significant ischemia and was overall low risk.  Echo in 2020 showed EF of 65 to 70%, grade 1 diastolic dysfunction, mild AI. More recently, amlodipine stopped due to lower leg edema.  Patient was started on Lasix.  Echo in August 2024 showed EF of 55 to 60%, severe asymmetric LVH without LVOT obstruction, grade 2 diastolic dysfunction.  Patient was last seen 06/09/2023 and was doing well from a cardiac perspective.  Patient was scheduled for right left heart cath. Left heart cath showed   Today, the patient reports SOB and fatigue. She has emphysema. Reports 4 months of fatigue. No chest pain. Fatigue occurs with overexertion. She has follow-up with pulmonary. EKG strip shows frequent PVCs.   Past Medical History:  Diagnosis Date   Allergy    Arthritis    Breast cancer (HCC)    left breast   Coronary artery disease 2001   CABG   DDD (degenerative disc disease), lumbosacral    Dyspnea    if rushing or climmbing lots of stairs   Emphysema lung (HCC)    emphysema   GERD (gastroesophageal reflux disease)    11/20/16- no a current problem   Hyperlipidemia     Hypertension    Irritable bowel syndrome (IBS)    Lung disorder    leison   Neuropathy    Pinched nerve    back and left leg   Pneumonia 2016    Past Surgical History:  Procedure Laterality Date   BREAST FIBROADENOMA SURGERY  01/13/1980   BREAST SURGERY  02/01/2004   tram/mastectomyfor recurrence   CARPOMETACARPEL SUSPENSION PLASTY Right 12/17/2017   Procedure: SUSPENSIONPLASTY RIGHT THUMB WITH EXCISION TRAPEZIUM;  Surgeon: Cindee Salt, MD;  Location: [facility];  Service: Orthopedics;  Laterality: Right;   COLONOSCOPY     CORONARY ANGIOPLASTY  2001   had tear to two vessels- had to have open heart surgery   CORONARY ARTERY BYPASS GRAFT  2001   EYE SURGERY Bilateral    Cataract   KNEE ARTHROSCOPY  January 2011   right   MASTECTOMY PARTIAL / LUMPECTOMY W/ AXILLARY LYMPHADENECTOMY  04/10/1989   Dr Maple Hudson / left breast   PARTIAL HYSTERECTOMY     vaginal   RIGHT/LEFT HEART CATH AND CORONARY ANGIOGRAPHY Bilateral 06/17/2023   Procedure: RIGHT/LEFT HEART CATH AND CORONARY ANGIOGRAPHY;  Surgeon: Yvonne Kendall, MD;  Location: [facility];  Service: Cardiovascular;  Laterality: Bilateral;   TENDON TRANSFER Right 12/17/2017   Procedure: ABDUCTOR POLLICIS LONGUS TRANSFER;  Surgeon: Cindee Salt, MD;  Location: [facility];  Service: Orthopedics;  Laterality: Right;   TONSILLECTOMY  AND ADENOIDECTOMY     TOTAL HIP ARTHROPLASTY Right 08/05/2016   Procedure: TOTAL HIP ARTHROPLASTY ANTERIOR APPROACH;  Surgeon: Marcene Corning, MD;  Location: [facility];  Service: Orthopedics;  Laterality: Right;   UPPER GASTROINTESTINAL ENDOSCOPY     VIDEO BRONCHOSCOPY WITH ENDOBRONCHIAL NAVIGATION N/A 11/27/2016   Procedure: VIDEO BRONCHOSCOPY WITH ENDOBRONCHIAL NAVIGATION;  Surgeon: Loreli Slot, MD;  Location: [facility];  Service: Thoracic;  Laterality: N/A;    Current Medications: Current Meds  Medication Sig   acetaminophen (TYLENOL) 500 MG tablet Take 1,000 mg by mouth  2 (two) times daily.    albuterol (VENTOLIN HFA) 108 (90 Base) MCG/ACT inhaler INHALE 1-2 PUFFS BY MOUTH EVERY 6 HOURS AS NEEDED FOR WHEEZING OR SHORTNESS OF BREATH   ANORO ELLIPTA 62.5-25 MCG/ACT AEPB INHALE 1 PUFF BY MOUTH INTO LUNGS BY MOUTH DAILY   aspirin EC 81 MG tablet Take 81 mg by mouth daily.   atorvastatin (LIPITOR) 20 MG tablet Take 1 tablet (20 mg total) by mouth daily.   Biotin 16109 MCG TABS Take 1 tablet by mouth daily.   carvedilol (COREG) 3.125 MG tablet Take 1 tablet (3.125 mg total) by mouth 2 (two) times daily.   fexofenadine (ALLEGRA) 180 MG tablet Take 180 mg by mouth daily.   fish oil-omega-3 fatty acids 1000 MG capsule Take 1 g by mouth daily.    gabapentin (NEURONTIN) 300 MG capsule Take 300 mg by mouth 3 (three) times daily.   Glucosamine-Chondroit-Vit C-Mn (GLUCOSAMINE CHONDR 1500 COMPLX PO) Take 1 tablet by mouth 2 (two) times daily.   Multiple Vitamin (MULTIVITAMIN) tablet Take 1 tablet by mouth daily.       Allergies:   Amlodipine, Hydromorphone, Azithromycin, Ciprofloxacin, Hydromorphone hcl, Levofloxacin, and Lisinopril   Social History   Socioeconomic History   Marital status: Married    Spouse name: Not on file   Number of children: Not on file   Years of education: Not on file   Highest education level: Not on file  Occupational History   Not on file  Tobacco Use   Smoking status: Former    Current packs/day: 0.00    Types: Cigarettes    Start date: 08/01/1959    Quit date: 07/31/1989    Years since quitting: 33.9   Smokeless tobacco: Never  Vaping Use   Vaping status: Never Used  Substance and Sexual Activity   Alcohol use: Yes    Alcohol/week: 1.0 standard drink of alcohol    Types: 1 Glasses of wine per week    Comment: occasional wine   Drug use: No   Sexual activity: Not on file  Other Topics Concern   Not on file  Social History Narrative   Not on file   Social Determinants of Health   Financial Resource Strain: Not on file   Food Insecurity: Not on file  Transportation Needs: Not on file  Physical Activity: Not on file  Stress: Not on file  Social Connections: Not on file     Family History: The patient's family history includes Colon cancer in her paternal grandmother, paternal uncle, and son; Emphysema in her father; Heart disease in her mother.  ROS:   Please see the history of present illness.     All other systems reviewed and are negative.  EKGs/Labs/Other Studies Reviewed:    The following studies were reviewed today: ***  EKG:  EKG is *** ordered today.  The ekg ordered today demonstrates ***  Recent Labs: 07/15/2022: TSH 2.33 06/01/2023:  ALT 31 06/09/2023: Platelets 121 06/12/2023: BUN 29; Creatinine, Ser 0.87 06/17/2023: Hemoglobin 12.2; Potassium 4.2; Sodium 141  Recent Lipid Panel    Component Value Date/Time   CHOL 145 06/01/2023 1018   CHOL 155 12/08/2017 1045   TRIG 53 06/01/2023 1018   HDL 67 06/01/2023 1018   HDL 72 12/08/2017 1045   CHOLHDL 2.2 06/01/2023 1018   VLDL 11 06/01/2023 1018   LDLCALC 67 06/01/2023 1018   LDLCALC 67 12/08/2017 1045     Risk Assessment/Calculations:   {Does this patient have ATRIAL FIBRILLATION?[phone]}   Physical Exam:    VS:  BP (!) 104/50 (BP Location: Left Arm, Patient Position: Sitting, Cuff Size: Normal)   Pulse 66   Ht 5\' 5"  (1.651 m)   Wt 123 lb (55.8 kg)   LMP  (LMP Unknown)   SpO2 95%   BMI 20.47 kg/m     Wt Readings from Last 3 Encounters:  07/07/23 123 lb (55.8 kg)  06/17/23 130 lb (59 kg)  06/09/23 124 lb (56.2 kg)     GEN:  Well nourished, well developed in no acute distress HEENT: Normal NECK: No JVD; No carotid bruits LYMPHATICS: No lymphadenopathy CARDIAC: RRR, no murmurs, rubs, gallops RESPIRATORY:  Clear to auscultation without rales, wheezing or rhonchi  ABDOMEN: Soft, non-tender, non-distended MUSCULOSKELETAL:  No edema; No deformity  SKIN: Warm and dry NEUROLOGIC:  Alert and oriented x  3 PSYCHIATRIC:  Normal affect   ASSESSMENT:    1. PVC (premature ventricular contraction)   2. Coronary artery disease involving native coronary artery of native heart with other form of angina pectoris (HCC)   3. S/P CABG x 3   4. LVH (left ventricular hypertrophy)   5. Chronic obstructive pulmonary disease, unspecified COPD type (HCC)    PLAN:    In order of problems listed above:  PVCs EKG shows frequent PVCs. Patient reports 4 months of fatigue, unsure if PVCs are contributing. She is on Coreg 3.125mg  BID. Cannot increase with soft pressures. I will order a 2 week heart monitor. I will check TSH and Mag  CAD LHC showed no PCI targets and known CAD. She denies chest pain. She has fatigue with severe DOE. Cath site is stable.   LVH I will order cMRI.   Emphysema/COPD Prior tobacco use. RHC showed mild to moderate pulmonary HTN.    Disposition: Follow up in 2 month(s) with MD/APP    Signed, Jayliah Benett David Stall, PA-C  07/07/2023 4:17 PM    [facility]

## 2023-07-08 LAB — MAGNESIUM: Magnesium: 2.2 mg/dL (ref 1.6–2.3)

## 2023-07-08 LAB — TSH: TSH: 2.73 u[IU]/mL (ref 0.450–4.500)

## 2023-07-10 ENCOUNTER — Ambulatory Visit: Payer: PPO | Admitting: Physician Assistant

## 2023-07-13 DIAGNOSIS — I493 Ventricular premature depolarization: Secondary | ICD-10-CM | POA: Diagnosis not present

## 2023-07-15 ENCOUNTER — Ambulatory Visit
Admission: RE | Admit: 2023-07-15 | Discharge: 2023-07-15 | Disposition: A | Discharge status: Home / Self Care | Payer: PPO | Source: Ambulatory Visit | Attending: Emergency Medicine | Admitting: Emergency Medicine

## 2023-07-15 DIAGNOSIS — A310 Pulmonary mycobacterial infection: Secondary | ICD-10-CM

## 2023-07-22 ENCOUNTER — Ambulatory Visit (INDEPENDENT_AMBULATORY_CARE_PROVIDER_SITE_OTHER): Payer: PPO | Admitting: Emergency Medicine

## 2023-07-22 ENCOUNTER — Encounter: Payer: Self-pay | Admitting: Emergency Medicine

## 2023-07-22 VITALS — BP 144/58 | HR 71 | Temp 97.5°F | Ht 65.0 in | Wt 131.0 lb

## 2023-07-22 DIAGNOSIS — J449 Chronic obstructive pulmonary disease, unspecified: Secondary | ICD-10-CM | POA: Diagnosis not present

## 2023-07-22 DIAGNOSIS — R911 Solitary pulmonary nodule: Secondary | ICD-10-CM | POA: Diagnosis not present

## 2023-07-22 DIAGNOSIS — A310 Pulmonary mycobacterial infection: Secondary | ICD-10-CM

## 2023-07-22 DIAGNOSIS — R918 Other nonspecific abnormal finding of lung field: Secondary | ICD-10-CM | POA: Diagnosis not present

## 2023-07-22 DIAGNOSIS — Z23 Encounter for immunization: Secondary | ICD-10-CM

## 2023-07-22 NOTE — Addendum Note (Signed)
Addended by: Lanna Poche on: 07/22/2023 03:15 PM   Modules accepted: Orders

## 2023-07-22 NOTE — Assessment & Plan Note (Signed)
Abnormal CT chest with a right lower lobe mass.  It looks slightly smaller, still has calcifications present but it now has a cavitary component.  I suspect it is related to her [facility].  We need to keep following, neck scan to be done in 6 months.

## 2023-07-22 NOTE — Assessment & Plan Note (Signed)
Continue Anoro, albuterol as needed.  No flares.  Consider repeat walking oximetry next time

## 2023-07-22 NOTE — Progress Notes (Signed)
Subjective:    Patient ID: Tiffany Caldwell, female    DOB: 16-Sep-1941, 82 y.o.   MRN: [id number]  HPI  ROV 03/03/2023 --Ms. Amorim is 58 with history of moderately severe COPD and untreated Mycobacterium avium.  Also with a history of breast cancer and CAD/CABG.  We have been following an abnormal CT scan of the chest with a calcified right lower lobe masslike lesion 3.5 cm and some scattered pulmonary nodular disease and bronchiectasis.  Most recent scan 07/08/2022.  When I last saw her in October 2023 she was dealing with exertional fatigue.  We discovered exertional hypoxemia and was titrated to 2 L/min with exertion. O2 was not ordered. She has SOB with extended stair-climbing. No real cough. Remains on Anoro. No flares since last time. Her vaccines are up to date.   ROV 07/22/2023 --82 year old woman with moderately severe COPD, history of Mycobacterium avium (untreated), breast cancer, CAD/CABG.  She has an abnormal CT scan of the chest with a microlobulated partially calcified right lower lobe mass 3.3 x 3.4 cm as well as some associated bronchial wall thickening and cylindrical bronchiectasis.  Currently managed on Anoro. She is is having more exertional SOB with long walks, doing housework. No cough.   CT scan of the chest 07/15/2023 reviewed by me, shows that the right lower lobe irregular mass lesion has some increased calcification and is now slightly smaller and cavitary.   Review of Systems  Respiratory:  Positive for shortness of breath.   Musculoskeletal:  Positive for arthralgias.      Objective:   Physical Exam Vitals:   07/22/23 1427 07/22/23 1433  BP: (!) 155/57 (!) 144/58  Pulse: 71   Temp: (!) 97.5 F (36.4 C)   TempSrc: Oral   SpO2: 91%   Weight: 131 lb (59.4 kg)   Height: 5\' 5"  (1.651 m)     Gen: Pleasant, well-nourished, in no distress,  normal affect, moderate kyphosis  ENT: No lesions,  mouth clear,  oropharynx clear, no postnasal drip  Neck: No JVD,  very soft inspiratory and expiratory stridor  Lungs: No use of accessory muscles, distant, no crackles or wheezing on normal respiration, no wheeze on forced expiration  Cardiovascular: RRR, heart sounds normal, no murmur or gallops, no peripheral edema  Musculoskeletal: No deformities, no cyanosis or clubbing  Neuro: alert, awake, non focal  Skin: Warm, no lesions or rash      Assessment & Plan:  Right lower lobe lung mass Abnormal CT chest with a right lower lobe mass.  It looks slightly smaller, still has calcifications present but it now has a cavitary component.  I suspect it is related to her [facility].  We need to keep following, neck scan to be done in 6 months.  COPD (chronic obstructive pulmonary disease) (HCC) Continue Anoro, albuterol as needed.  No flares.  Consider repeat walking oximetry next time  Pulmonary Mycobacterium avium infection No constitutional symptoms.  No cough.  She has some increased dyspnea and some decreased functional capacity but no clear indication either clinically or on her CT to treat MAIC at this time.  We will continue to follow.   Levy Pupa, MD, PhD 07/22/2023, 3:03 PM [facility] [phone] or if no answer [phone]

## 2023-07-22 NOTE — Patient Instructions (Signed)
We reviewed her CT scan of the chest today. We will plan to repeat your CT chest in 6 months Continue Anoro once daily. Keep albuterol available to use 2 puffs when needed for shortness of breath, chest tightness, wheezing.  You could try taking 2 puffs about 15 minutes prior to significant exertion to see if it helps your breathing Flu shot today Follow with Dr Delton Coombes in 6 months or sooner if you have any problems

## 2023-07-22 NOTE — Assessment & Plan Note (Signed)
No constitutional symptoms.  No cough.  She has some increased dyspnea and some decreased functional capacity but no clear indication either clinically or on her CT to treat MAIC at this time.  We will continue to follow.

## 2023-07-31 ENCOUNTER — Telehealth: Payer: Self-pay | Admitting: Internal Medicine

## 2023-07-31 NOTE — Telephone Encounter (Signed)
Pt c/o medication issue:  1. Name of Medication:   carvedilol (COREG) 3.125 MG tablet (Expired)     2. How are you currently taking this medication (dosage and times per day)?   Take 1 tablet (3.125 mg total) by mouth 2 (two) times daily.    3. Are you having a reaction (difficulty breathing--STAT)? No  4. What is your medication issue? Pt states that the medication is causing her to have Bradycardia. Please advise

## 2023-07-31 NOTE — Telephone Encounter (Signed)
If she is asymptomatic, she can continue low-dose Coreg 3.125 mg bid. I would like for her to wear a 3-day Zio XT to obtain an average heart rate. Dx: bradycardia.

## 2023-07-31 NOTE — Telephone Encounter (Signed)
Returned the call to the patient. She stated that since she started on the Carvedilol 3.125 mg bid, that she has been having lower heart rates in the 50's. She was asked for specific readings but stated that her heart rates stay in the 50's all day. She could not provide any blood pressure readings.  She is asymptomatic.

## 2023-08-03 NOTE — Telephone Encounter (Signed)
Multiple calls to pt - mobile is the preference - pt reports she will keep taking the coreg 3.125 mg BID but does "NOT want to wear another - I just wore one why don't you use that one?"  Will have team review that ZIO that Cadence ordered and advise the pt tomorrow.

## 2023-08-03 NOTE — Telephone Encounter (Signed)
Noted.  Average heart rate on Zio patch ordered by other provider of 63 bpm.  No need to wear another monitor.

## 2023-08-04 NOTE — Telephone Encounter (Signed)
The patient has been notified of the recommendations of Ryan. Zio monitor BPM rate discussed.  Pt verbalized understanding. All questions were answered.  Pt is still denying symptoms of low HR, pt apprised of possible s/sx and asked to call if noted.  Pt encouraged to call if any concerns or questions arise

## 2023-08-04 NOTE — Telephone Encounter (Signed)
Pt called and message left for callback

## 2023-08-04 NOTE — Telephone Encounter (Signed)
Home and cell phone called - msg left for call back

## 2023-08-12 ENCOUNTER — Other Ambulatory Visit: Payer: Self-pay

## 2023-08-12 DIAGNOSIS — I471 Supraventricular tachycardia, unspecified: Secondary | ICD-10-CM

## 2023-08-12 DIAGNOSIS — I493 Ventricular premature depolarization: Secondary | ICD-10-CM

## 2023-09-08 ENCOUNTER — Ambulatory Visit: Payer: PPO | Attending: Medical | Admitting: Medical

## 2023-09-08 ENCOUNTER — Encounter: Payer: Self-pay | Admitting: Medical

## 2023-09-08 VITALS — BP 146/68 | HR 56 | Ht 65.0 in | Wt 127.2 lb

## 2023-09-08 DIAGNOSIS — I491 Atrial premature depolarization: Secondary | ICD-10-CM

## 2023-09-08 DIAGNOSIS — I517 Cardiomegaly: Secondary | ICD-10-CM | POA: Diagnosis not present

## 2023-09-08 DIAGNOSIS — M7989 Other specified soft tissue disorders: Secondary | ICD-10-CM

## 2023-09-08 DIAGNOSIS — I272 Pulmonary hypertension, unspecified: Secondary | ICD-10-CM

## 2023-09-08 DIAGNOSIS — Z951 Presence of aortocoronary bypass graft: Secondary | ICD-10-CM

## 2023-09-08 DIAGNOSIS — I25118 Atherosclerotic heart disease of native coronary artery with other forms of angina pectoris: Secondary | ICD-10-CM | POA: Diagnosis not present

## 2023-09-08 DIAGNOSIS — I5032 Chronic diastolic (congestive) heart failure: Secondary | ICD-10-CM

## 2023-09-08 DIAGNOSIS — I493 Ventricular premature depolarization: Secondary | ICD-10-CM

## 2023-09-08 DIAGNOSIS — J449 Chronic obstructive pulmonary disease, unspecified: Secondary | ICD-10-CM

## 2023-09-08 NOTE — Patient Instructions (Signed)
Medication Instructions:  Your physician recommends the following medication changes.  START TAKING: Lasix 20 mg daily for 2-3 days, then decrease to 20 mg daily as needed for swelling.  *If you need a refill on your cardiac medications before your next appointment, please call your pharmacy*   Lab Work: No labs ordered today    Testing/Procedures: No test ordered today  Our office will contact you regarding scheduling Cardiac MRI   Follow-Up: At [facility], you and your health needs are our priority.  As part of our continuing mission to provide you with exceptional heart care, we have created designated Provider Care Teams.  These Care Teams include your primary Cardiologist (physician) and Advanced Practice Providers (APPs -  Physician Assistants and Nurse Practitioners) who all work together to provide you with the care you need, when you need it.  We recommend signing up for the patient portal called "MyChart".  Sign up information is provided on this After Visit Summary.  MyChart is used to connect with patients for Virtual Visits (Telemedicine).  Patients are able to view lab/test results, encounter notes, upcoming appointments, etc.  Non-urgent messages can be sent to your provider as well.   To learn more about what you can do with MyChart, go to [email or web address].    Your next appointment:   3 month(s)  Provider:   You may see Yvonne Kendall, MD or one of the following Advanced Practice Providers on your designated Care Team:   Nicolasa Ducking, NP Eula Listen, PA-C Cadence Fransico Michael, PA-C Charlsie Quest, NP Carlos Levering, NP    Your physician recommends that you schedule a follow-up appointment with Dr. Lalla Brothers of Dr. Jimmey Ralph for EP

## 2023-09-08 NOTE — Progress Notes (Signed)
Cardiology Office Note:    Date:  09/08/2023   ID:  Tiffany Caldwell, Tiffany Caldwell [birth year], MRN [id number]  PCP:  Delma Officer, PA  [facility] Cardiologist:  Yvonne Kendall, MD  [facility] Electrophysiologist:  None   Referring MD: Delma Officer, [place]   Chief Complaint: 2 month follow-up  History of Present Illness:    Tiffany Caldwell is a 82 y.o. female with a hx of CAD status post remote PCI with subsequent emergent three-vessel CABG for iatrogenic left main/LAD dissection in 2001, right lower lobe cavitary lung mass, labile hypertension, hyperlipidemia, HFpEF, left-sided breast cancer, COPD, IBS, GERD who presents for follow-up.   In 2001 patient underwent CABG x 3 with LIMA to LAD, SVG to diagonal, and SVG to OM.  Echo from 2016 showed EF of 55 to 60%, grade 1 diastolic dysfunction.  Lexiscan in 2020 showed no significant ischemia and was overall low risk.  Echo in 2020 showed EF of 65 to 70%, grade 1 diastolic dysfunction, mild AI. More recently, amlodipine stopped due to lower leg edema.  Patient was started on Lasix.  Echo in August 2024 showed EF of 55 to 60%, severe asymmetric LVH without LVOT obstruction, grade 2 diastolic dysfunction.   Patient was last seen 06/09/2023 and was doing well from a cardiac perspective.  Echo was ordered and patient was scheduled for right left heart cath.  Echo showed LV EF 55 to 60%, severe asymmetric LVH, no LVOT obstruction, grade 2 diastolic dysfunction, moderately elevated pulmonary systolic pressure, severely dilated left atrium, moderately dilated right atrium, mild to moderate MR, mild AI.  Left heart cath showed severe single-vessel CAD with CTO of the proximal to mid LAD stent, with mid distal LAD supplied by patent LIMA graft.  There was also 60 to 70% stenosis at the ostium of the OM1, 50% mid left circumflex disease, 30% proximal RCA stenosis.  Widely patent LIMA to LAD, chronically occluded SVG to D1 and SVG to OM 2.  Normal left  and right heart filling pressures.  Mild to moderate pulmonary hypertension.  It was felt chronic lung disease was mostly driving patient's symptoms.  if symptoms persisted, recommended cardiac MRI  to evaluate severe asymmetric LVH.  The patient was last seen 07/07/23 reporting SOB and fatigue. EKG showed PVCs, a heart monitor was ordered. cRMI was ordered for LVH.   Today, the patient reports she is overall doing well. She has unchanged SOB from the emphysema. She denies heart racing or skipping a beat. She denies lower leg edema. She takes lasix 20mg  as needed. She has 1+ lower leg edema on exam.   Past Medical History:  Diagnosis Date   Allergy    Arthritis    Breast cancer (HCC)    left breast   Coronary artery disease 2001   CABG   DDD (degenerative disc disease), lumbosacral    Dyspnea    if rushing or climmbing lots of stairs   Emphysema lung (HCC)    emphysema   GERD (gastroesophageal reflux disease)    11/20/16- no a current problem   Hyperlipidemia    Hypertension    Irritable bowel syndrome (IBS)    Lung disorder    leison   Neuropathy    Pinched nerve    back and left leg   Pneumonia 2016    Past Surgical History:  Procedure Laterality Date   BREAST FIBROADENOMA SURGERY  01/13/1980   BREAST SURGERY  02/01/2004   tram/mastectomyfor recurrence  CARPOMETACARPEL SUSPENSION PLASTY Right 12/17/2017   Procedure: SUSPENSIONPLASTY RIGHT THUMB WITH EXCISION TRAPEZIUM;  Surgeon: Cindee Salt, MD;  Location: [facility];  Service: Orthopedics;  Laterality: Right;   COLONOSCOPY     CORONARY ANGIOPLASTY  2001   had tear to two vessels- had to have open heart surgery   CORONARY ARTERY BYPASS GRAFT  2001   EYE SURGERY Bilateral    Cataract   KNEE ARTHROSCOPY  January 2011   right   MASTECTOMY PARTIAL / LUMPECTOMY W/ AXILLARY LYMPHADENECTOMY  04/10/1989   Dr Maple Hudson / left breast   PARTIAL HYSTERECTOMY     vaginal   RIGHT/LEFT HEART CATH AND CORONARY ANGIOGRAPHY  Bilateral 06/17/2023   Procedure: RIGHT/LEFT HEART CATH AND CORONARY ANGIOGRAPHY;  Surgeon: Yvonne Kendall, MD;  Location: [facility];  Service: Cardiovascular;  Laterality: Bilateral;   TENDON TRANSFER Right 12/17/2017   Procedure: ABDUCTOR POLLICIS LONGUS TRANSFER;  Surgeon: Cindee Salt, MD;  Location: [facility];  Service: Orthopedics;  Laterality: Right;   TONSILLECTOMY AND ADENOIDECTOMY     TOTAL HIP ARTHROPLASTY Right 08/05/2016   Procedure: TOTAL HIP ARTHROPLASTY ANTERIOR APPROACH;  Surgeon: Marcene Corning, MD;  Location: [facility];  Service: Orthopedics;  Laterality: Right;   UPPER GASTROINTESTINAL ENDOSCOPY     VIDEO BRONCHOSCOPY WITH ENDOBRONCHIAL NAVIGATION N/A 11/27/2016   Procedure: VIDEO BRONCHOSCOPY WITH ENDOBRONCHIAL NAVIGATION;  Surgeon: Loreli Slot, MD;  Location: [facility];  Service: Thoracic;  Laterality: N/A;    Current Medications: Current Meds  Medication Sig   acetaminophen (TYLENOL) 500 MG tablet Take 1,000 mg by mouth 2 (two) times daily.    albuterol (VENTOLIN HFA) 108 (90 Base) MCG/ACT inhaler INHALE 1-2 PUFFS BY MOUTH EVERY 6 HOURS AS NEEDED FOR WHEEZING OR SHORTNESS OF BREATH   ANORO ELLIPTA 62.5-25 MCG/ACT AEPB INHALE 1 PUFF BY MOUTH INTO LUNGS BY MOUTH DAILY   aspirin EC 81 MG tablet Take 81 mg by mouth daily.   atorvastatin (LIPITOR) 20 MG tablet Take 1 tablet (20 mg total) by mouth daily.   Biotin 30865 MCG TABS Take 1 tablet by mouth daily.   carvedilol (COREG) 3.125 MG tablet Take 1 tablet (3.125 mg total) by mouth 2 (two) times daily.   fexofenadine (ALLEGRA) 180 MG tablet Take 180 mg by mouth daily.   fish oil-omega-3 fatty acids 1000 MG capsule Take 1 g by mouth daily.    gabapentin (NEURONTIN) 300 MG capsule Take 300 mg by mouth 3 (three) times daily.   Glucosamine-Chondroit-Vit C-Mn (GLUCOSAMINE CHONDR 1500 COMPLX PO) Take 1 tablet by mouth once.   Multiple Vitamin (MULTIVITAMIN) tablet Take 1 tablet by mouth daily.        Allergies:   Amlodipine, Hydromorphone, Azithromycin, Ciprofloxacin, Hydromorphone hcl, Levofloxacin, and Lisinopril   Social History   Socioeconomic History   Marital status: Married    Spouse name: Not on file   Number of children: Not on file   Years of education: Not on file   Highest education level: Not on file  Occupational History   Not on file  Tobacco Use   Smoking status: Former    Current packs/day: 0.00    Types: Cigarettes    Start date: 08/01/1959    Quit date: 07/31/1989    Years since quitting: 34.1   Smokeless tobacco: Never  Vaping Use   Vaping status: Never Used  Substance and Sexual Activity   Alcohol use: Yes    Alcohol/week: 1.0 standard drink of alcohol  Types: 1 Glasses of wine per week    Comment: occasional wine   Drug use: No   Sexual activity: Not on file  Other Topics Concern   Not on file  Social History Narrative   Not on file   Social Determinants of Health   Financial Resource Strain: Not on file  Food Insecurity: Not on file  Transportation Needs: Not on file  Physical Activity: Not on file  Stress: Not on file  Social Connections: Not on file     Family History: The patient's family history includes Colon cancer in her paternal grandmother, paternal uncle, and son; Emphysema in her father; Heart disease in her mother.  ROS:   Please see the history of present illness.     All other systems reviewed and are negative.  EKGs/Labs/Other Studies Reviewed:    The following studies were reviewed today:  Heart monitor 07/2023   The patient was monitored for 13 days, 23 hours.   The predominant rhythm was sinus with an average rate of 62 bpm (range 42-86 bpm in sinus).   There were occasional PACs (4.2% burden) and frequent PVCs (22.7% burden).   3 episodes of nonsustained ventricular tachycardia occurred, lasting up to 9 beats with a maximum rate of 176 bpm.   There were 41 supraventricular runs, lasting up to 16 beats with  a maximum rate of 164 bpm.   No sustained arrhythmia or prolonged pause was observed.   There were no patient triggered events.   Predominantly sinus rhythm with frequent PVCs and occasional PACs.  Three episodes of NSVT and multiple supraventricular runs were observed, as detailed above.    R/L heart cath 06/2023 Conclusions: Severe single-vessel coronary artery disease with chronic total occlusion of proximal/mid LAD stent, with mid/distal LAD supplied by patent LIMA graft.  There is also 60-70% stenosis at the ostium of OM1, 50% mid LCx disease, and 30% proximal RCA stenosis. Widely patent LIMA-LAD. Chronically occluded SVG-D1 and SVG-OM1. Normal left and right heart filling pressures (LVEDP 14 mmHg, mean RA 3 mmHg, RVEDP 6 mmHg). Mild to moderate pulmonary hypertension (PA 50/14, mean 26 mmHg). Normal Fick cardiac output/index (CO 5.3 L/min, CI 3.2 L/min/m).   Recommendations: Continue follow-up with pulmonology for management of chronic lung disease that is most likely driving Ms. Dicson's chronic exertional dyspnea and mild to moderate pulmonary hypertension.  If symptoms persist, 1 could consider cardiac MRI to better evaluate severe asymmetric left ventricular hypertrophy as well as consultation with the advanced heart failure service. Aggressive secondary prevention of coronary artery disease.  Favor medical management of 60-70% ostial OM1 and 50% mid LCx disease.   Yvonne Kendall, MD [facility]  Echo 05/2023 1. No LVOT obstruction.. Left ventricular ejection fraction, by  estimation, is 55 to 60%. The left ventricle has normal function. The left  ventricle has no regional wall motion abnormalities. There is severe  asymmetric left ventricular hypertrophy of  the basal-septal segment. Left ventricular diastolic parameters are  consistent with Grade II diastolic dysfunction (pseudonormalization).   2. Right ventricular systolic function is low normal. The right  ventricular  size is normal. There is moderately elevated pulmonary artery  systolic pressure.   3. Left atrial size was severely dilated.   4. Right atrial size was mild to moderately dilated.   5. The mitral valve is normal in structure. Mild to moderate mitral valve  regurgitation.   6. The aortic valve is tricuspid. Aortic valve regurgitation is mild.  Aortic valve sclerosis  is present, with no evidence of aortic valve  stenosis.   7. The inferior vena cava is dilated in size with >50% respiratory  variability, suggesting right atrial pressure of 8 mmHg.    EKG:  EKG is ordered today.  The ekg ordered today demonstrates SB 56bpm, PAC, LAD, LVH with repol  Recent Labs: 06/01/2023: ALT 31 06/09/2023: Platelets 121 06/12/2023: BUN 29; Creatinine, Ser 0.87 06/17/2023: Potassium 4.2; Sodium 141 07/07/2023: Hemoglobin 13.9; Magnesium 2.2; TSH 2.730  Recent Lipid Panel    Component Value Date/Time   CHOL 145 06/01/2023 1018   CHOL 155 12/08/2017 1045   TRIG 53 06/01/2023 1018   HDL 67 06/01/2023 1018   HDL 72 12/08/2017 1045   CHOLHDL 2.2 06/01/2023 1018   VLDL 11 06/01/2023 1018   LDLCALC 67 06/01/2023 1018   LDLCALC 67 12/08/2017 1045    Physical Exam:    VS:  BP (!) 146/68 (BP Location: Left Arm, Patient Position: Sitting, Cuff Size: Normal)   Pulse (!) 56   Ht 5\' 5"  (1.651 m)   Wt 127 lb 3.2 oz (57.7 kg)   LMP  (LMP Unknown)   SpO2 94%   BMI 21.17 kg/m     Wt Readings from Last 3 Encounters:  09/08/23 127 lb 3.2 oz (57.7 kg)  07/22/23 131 lb (59.4 kg)  07/07/23 123 lb (55.8 kg)     GEN:  Well nourished, well developed in no acute distress HEENT: Normal NECK: No JVD; No carotid bruits LYMPHATICS: No lymphadenopathy CARDIAC: RRR, no murmurs, rubs, gallops RESPIRATORY:  Clear to auscultation without rales, wheezing or rhonchi  ABDOMEN: Soft, non-tender, non-distended MUSCULOSKELETAL:  1+ lower leg edema; No deformity  SKIN: Warm and dry NEUROLOGIC:  Alert and oriented x  3 PSYCHIATRIC:  Normal affect   ASSESSMENT:    1. PVC (premature ventricular contraction)   2. PAC (premature atrial contraction)   3. LVH (left ventricular hypertrophy)   4. S/P CABG x 3   5. Coronary artery disease involving native coronary artery of native heart with other form of angina pectoris (HCC)   6. Chronic obstructive pulmonary disease, unspecified COPD type (HCC)   7. Pulmonary HTN (HCC)   8. Swelling of lower extremity   9. Chronic diastolic heart failure (HCC)    PLAN:    In order of problems listed above:  PVC/PACs Recent heart monitor showed NSR with 4.2% PAC burden and 22.7% PVC burden. She is on low dose coreg, cannot increase it due to baseline bradycardia.She was previously reporting fatigue, but feels this has gotten better.  We will check on referral to EP.   Severe LVH echo showed normal LVEF with severe asymmetric LVH basal-septal segment with no LVOT . We will schedule cMRI.   CAD s/p CABG x 3 LHC 06/2023 showed no PCI targets, normal right and left heart filling pressures, mild to moderate pulmonary HTN. She reports unchanged SOB from emphysema.   Emphysema/COPD Mild to mod pulmonary HTN H/o tobacco use. RHC showed mild to moderate pulmonary HTN. She follows with pulmonology.   LLE HFpEF I recommended she take lasix 20mg  for 2-3 days. She normally takes lasix as needed.   Disposition: Follow up in 3 month(s) with MD/APP    Signed, Aspynn Clover David Stall, PA-C  09/08/2023 3:58 PM    [facility]

## 2023-09-30 ENCOUNTER — Telehealth: Payer: Self-pay | Admitting: Internal Medicine

## 2023-09-30 ENCOUNTER — Emergency Department
Admission: EM | Admit: 2023-09-30 | Discharge: 2023-10-01 | Disposition: A | Discharge status: Home / Self Care | Payer: PPO | Attending: Emergency Medicine | Admitting: Emergency Medicine

## 2023-09-30 ENCOUNTER — Other Ambulatory Visit: Payer: Self-pay

## 2023-09-30 ENCOUNTER — Emergency Department: Payer: PPO

## 2023-09-30 DIAGNOSIS — I10 Essential (primary) hypertension: Secondary | ICD-10-CM | POA: Insufficient documentation

## 2023-09-30 DIAGNOSIS — R531 Weakness: Secondary | ICD-10-CM | POA: Diagnosis not present

## 2023-09-30 DIAGNOSIS — Z20822 Contact with and (suspected) exposure to covid-19: Secondary | ICD-10-CM | POA: Insufficient documentation

## 2023-09-30 DIAGNOSIS — I119 Hypertensive heart disease without heart failure: Secondary | ICD-10-CM | POA: Diagnosis not present

## 2023-09-30 DIAGNOSIS — R030 Elevated blood-pressure reading, without diagnosis of hypertension: Secondary | ICD-10-CM | POA: Diagnosis present

## 2023-09-30 DIAGNOSIS — Z951 Presence of aortocoronary bypass graft: Secondary | ICD-10-CM | POA: Insufficient documentation

## 2023-09-30 DIAGNOSIS — J449 Chronic obstructive pulmonary disease, unspecified: Secondary | ICD-10-CM | POA: Insufficient documentation

## 2023-09-30 DIAGNOSIS — R6889 Other general symptoms and signs: Secondary | ICD-10-CM

## 2023-09-30 DIAGNOSIS — R0789 Other chest pain: Secondary | ICD-10-CM | POA: Diagnosis not present

## 2023-09-30 LAB — CBC
HCT: 44.5 % (ref 36.0–46.0)
Hemoglobin: 14.4 g/dL (ref 12.0–15.0)
MCH: 33.2 pg (ref 26.0–34.0)
MCHC: 32.4 g/dL (ref 30.0–36.0)
MCV: 102.5 fL — ABNORMAL HIGH (ref 80.0–100.0)
Platelets: 122 10*3/uL — ABNORMAL LOW (ref 150–400)
RBC: 4.34 MIL/uL (ref 3.87–5.11)
RDW: 12.5 % (ref 11.5–15.5)
WBC: 4.2 10*3/uL (ref 4.0–10.5)
nRBC: 0 % (ref 0.0–0.2)

## 2023-09-30 LAB — URINALYSIS, ROUTINE W REFLEX MICROSCOPIC
Bilirubin Urine: NEGATIVE
Glucose, UA: NEGATIVE mg/dL
Hgb urine dipstick: NEGATIVE
Ketones, ur: NEGATIVE mg/dL
Leukocytes,Ua: NEGATIVE
Nitrite: NEGATIVE
Protein, ur: NEGATIVE mg/dL
Specific Gravity, Urine: 1.008 (ref 1.005–1.030)
pH: 7 (ref 5.0–8.0)

## 2023-09-30 LAB — BASIC METABOLIC PANEL
Anion gap: 8 (ref 5–15)
BUN: 23 mg/dL (ref 8–23)
CO2: 28 mmol/L (ref 22–32)
Calcium: 10.2 mg/dL (ref 8.9–10.3)
Chloride: 100 mmol/L (ref 98–111)
Creatinine, Ser: 0.8 mg/dL (ref 0.44–1.00)
GFR, Estimated: >60 mL/min (ref >60)
Glucose, Bld: 99 mg/dL (ref 70–99)
Potassium: 4.3 mmol/L (ref 3.5–5.1)
Sodium: 136 mmol/L (ref 135–145)

## 2023-09-30 LAB — TROPONIN I (HIGH SENSITIVITY): Troponin I (High Sensitivity): 25 ng/L — ABNORMAL HIGH (ref <18)

## 2023-09-30 MED ORDER — SODIUM CHLORIDE 0.9 % IV BOLUS
500.0000 mL | Freq: Once | INTRAVENOUS | Status: AC
  Administered 2023-09-30: 500 mL via INTRAVENOUS

## 2023-09-30 NOTE — Telephone Encounter (Signed)
Patient and her husband called to report that she's been "feeling bad" all day with no specific symptoms. Husband also noticed intermittent confusion during the day. No chest pain, SOB, palpitations, dizziness/lightheadedness, fever, or chills. Patient checked her BP and HR with SBP in the160s and lowest HR in the 50s. They were asking if her symptoms may be related to her low HR. Patient and husband could not recall her baseline HR. Review of prior visits shows that the patient's HR run in the range of 50s-70s. I informed the patient that I think it's unlikely that her HR would explain the way she's feeling as it's not far from her baseline, but my concern is that there may be another process (infection?) going on. I advised her to present to the ED/urgent care to get EKG, labs, and physical examination.   Rozanna Box, MD Cardiology

## 2023-09-30 NOTE — ED Triage Notes (Signed)
Pt reports HTN up to 210 systolic and low heart rate between 58-62. Pt states "I just feel bad"

## 2023-09-30 NOTE — ED Notes (Signed)
Pt stated that her feet were really cold and so this tech got her two warm blankets to drape over her feet. Pt had no other complaints at this time.

## 2023-09-30 NOTE — ED Provider Notes (Signed)
[facility] Provider Note    Event Date/Time   First MD Initiated Contact with Patient 09/30/23 2157     (approximate)   History   High blood pressure   HPI  Tiffany Caldwell is a 82 y.o. female who presents to the emergency department today with complaints of high blood pressure, not feeling well, weakness.  Patient states symptoms started this morning.  Throughout the whole day she has been feeling not well.  She describes this primarily as a feeling of generalized weakness.  She denies any chest pain or headache with this.  She did check her blood pressure at home and it was elevated.  Also noted that her heart rate seem to be low.  At the time my exam that she states that she is feeling slightly better.     Physical Exam   Triage Vital Signs: ED Triage Vitals  Encounter Vitals Group     BP 09/30/23 2147 (!) 178/108     Systolic BP Percentile --      Diastolic BP Percentile --      Pulse Rate 09/30/23 2147 (!) 49     Resp 09/30/23 2147 18     Temp 09/30/23 2143 98.2 F (36.8 C)     Temp src --      SpO2 09/30/23 2147 93 %     Weight 09/30/23 2143 127 lb (57.6 kg)     Height 09/30/23 2143 5\' 5"  (1.651 m)     Head Circumference --      Peak Flow --      Pain Score 09/30/23 2143 0     Pain Loc --      Pain Education --      Exclude from Growth Chart --     Most recent vital signs: Vitals:   09/30/23 2143 09/30/23 2147  BP:  (!) 178/108  Pulse:  (!) 49  Resp:  18  Temp: 98.2 F (36.8 C) 98 F (36.7 C)  SpO2:  93%   General: Awake, alert, oriented. CV:  Good peripheral perfusion. Irregular rhythm. Resp:  Normal effort. Lungs clear. Abd:  No distention.    ED Results / Procedures / Treatments   Labs (all labs ordered are listed, but only abnormal results are displayed) Labs Reviewed  CBC - Abnormal; Notable for the following components:      Result Value   MCV 102.5 (*)    Platelets 122 (*)    All other components within  normal limits  URINALYSIS, ROUTINE W REFLEX MICROSCOPIC - Abnormal; Notable for the following components:   Color, Urine STRAW (*)    APPearance CLEAR (*)    All other components within normal limits  TROPONIN I (HIGH SENSITIVITY) - Abnormal; Notable for the following components:   Troponin I (High Sensitivity) 25 (*)    All other components within normal limits  RESP PANEL BY RT-PCR (RSV, FLU A&B, COVID)  RVPGX2  BASIC METABOLIC PANEL  TROPONIN I (HIGH SENSITIVITY)     EKG  I, Phineas Semen, attending physician, personally viewed and interpreted this EKG  EKG Time: 2151 Rate: 85 Rhythm: sinus rhythm with sinus arrhythmia Axis: left axis deviation Intervals: qtc 466 QRS: narrow ST changes: no st elevation Impression: abnormal ekg   RADIOLOGY I independently interpreted and visualized the CXR. My interpretation: No pneumonia. Radiology interpretation:  IMPRESSION:  1. Evidence of prior median sternotomy/CABG.  2. COPD with chronic appearing increased interstitial lung markings.  PROCEDURES:  Critical Care performed: No    MEDICATIONS ORDERED IN ED: Medications - No data to display   IMPRESSION / MDM / ASSESSMENT AND PLAN / ED COURSE  I reviewed the triage vital signs and the nursing notes.                              Differential diagnosis includes, but is not limited to, anemia, electrolyte abnormality, dehydration, infection, acs  Patient's presentation is most consistent with acute presentation with potential threat to life or bodily function.   The patient is on the cardiac monitor to evaluate for evidence of arrhythmia and/or significant heart rate changes.  Patient presented to the emergency department today because of concerns for high blood pressure, not feeling well and weakness.  Patient's blood pressure was elevated here.  EKG shows sinus arrhythmia.  Will check blood work to evaluate for anemia, electrolyte abnormalities.  Will check  troponin.  Additionally will give patient small volume of fluids.  Initial troponin was slightly elevated.  However the most recent troponin we had was roughly 4 years ago.  This time unclear if this truly signifies acute heart injury or simply patient's baseline.  Will check repeat.  Awaiting repeat troponin and UA at time of signout.       FINAL CLINICAL IMPRESSION(S) / ED DIAGNOSES   Final diagnoses:  Hypertension, unspecified type  Feeling bad     Note:  This document was prepared using Dragon voice recognition software and may include unintentional dictation errors.    Phineas Semen, MD 09/30/23 [phone]

## 2023-10-01 ENCOUNTER — Other Ambulatory Visit: Payer: Self-pay

## 2023-10-01 ENCOUNTER — Telehealth: Payer: Self-pay | Admitting: Internal Medicine

## 2023-10-01 LAB — RESP PANEL BY RT-PCR (RSV, FLU A&B, COVID)  RVPGX2
Influenza A by PCR: NEGATIVE
Influenza B by PCR: NEGATIVE
Resp Syncytial Virus by PCR: NEGATIVE
SARS Coronavirus 2 by RT PCR: NEGATIVE

## 2023-10-01 LAB — TROPONIN I (HIGH SENSITIVITY)
Troponin I (High Sensitivity): 38 ng/L — ABNORMAL HIGH (ref <18)
Troponin I (High Sensitivity): 40 ng/L — ABNORMAL HIGH (ref <18)

## 2023-10-01 NOTE — Telephone Encounter (Signed)
Spoke with patient and she was agreeable to appointment on Friday 10/23/23 at 1:55 PM with PA-C Fransico Michael

## 2023-10-01 NOTE — Telephone Encounter (Signed)
The patient called reporting elevated blood pressure. She mentioned that yesterday she was evaluated in the ED due to her systolic pressure increasing to 197 (ED vitals are in the flow chart for review). The patient stated that no medication changes were made but reported feeling very fatigued. Current vitals are 145/88 with a heart rate of 66 bpm. The patient requested to be seen.  The nurse offered an appointment for 10/08/23 with Lavonna Rua, but the patient declined and requested to speak with Alycia Rossetti instead. The nurse informed the patient that a message could be sent for review.  Will froward to PA for review

## 2023-10-01 NOTE — Telephone Encounter (Signed)
I have reviewed her ED note and labs. Difficult to weigh in further. I agree with RN recommendation for office visit. ED precautions.

## 2023-10-01 NOTE — Telephone Encounter (Signed)
Pt c/o BP issue: STAT if pt c/o blurred vision, one-sided weakness or slurred speech  1. What are your last 5 BP readings?   "I do not have them written down"  "Got up to 197" per husband  2. Are you having any other symptoms (ex. Dizziness, headache, blurred vision, passed out)? No  3. What is your BP issue? Patient stated she just feels bad.

## 2023-10-01 NOTE — ED Provider Notes (Signed)
Patient received in signout from Dr. Derrill Kay pending second troponin and urinalysis.  UA without infectious features.  Second troponin rises a small amount, delta of about 13.  As below, I had multiple conversations with the patient where she is asymptomatic.  We decided upon obtaining a third troponin, which is flat and identical with the second.  I offered observation admission but she reports feeling fine, asymptomatic and she can follow-up with a cardiologist as an outpatient.  Clinical Course as of 10/01/23 0453  Thu Oct 01, 2023  0223 Long discussion regarding plan of care, workup and possible etiologies of vague weakness earlier today.  Patient reports feeling better now and "feeling fine."  She is laying flat on her side and reports no dyspnea or orthopnea.  No chest discomfort.  They describe a generalized vague fatigue at home and feeling unwell without any focal symptoms, better now.  We discussed troponins as well as 3 options regarding plan of care.  Admission, discharge and further observation of the third troponin.  They would like to pursue the third option, which I think is most reasonable.   [DS]  0451 Reassessed.  Still feels well.  We discussed persistent troponin, flat on repeat.  She is eager to go home.  They are happy to follow-up with her cardiologist as an outpatient.  We discussed ED return precautions. [DS]    Clinical Course User Index [DS] Delton Prairie, MD       Delton Prairie, MD 10/01/23 [phone]

## 2023-10-01 NOTE — ED Notes (Signed)
Patient given discharge instructions including importance of follow up appt as needed with stated understanding. INT removed, cannula intact, pressure dressing applied. Patient stable and wheeled out to lobby to await spouse to pull car around.

## 2023-10-13 ENCOUNTER — Encounter: Payer: Self-pay | Admitting: Cardiology

## 2023-10-13 ENCOUNTER — Ambulatory Visit: Payer: PPO | Attending: Cardiology | Admitting: Cardiology

## 2023-10-13 VITALS — BP 128/68 | HR 63 | Ht 65.0 in | Wt 123.0 lb

## 2023-10-13 DIAGNOSIS — I5032 Chronic diastolic (congestive) heart failure: Secondary | ICD-10-CM

## 2023-10-13 DIAGNOSIS — I493 Ventricular premature depolarization: Secondary | ICD-10-CM

## 2023-10-13 DIAGNOSIS — I2510 Atherosclerotic heart disease of native coronary artery without angina pectoris: Secondary | ICD-10-CM

## 2023-10-13 DIAGNOSIS — I491 Atrial premature depolarization: Secondary | ICD-10-CM | POA: Diagnosis not present

## 2023-10-13 MED ORDER — MEXILETINE HCL 150 MG PO CAPS: 150.0000 mg | ORAL_CAPSULE | Freq: Two times a day (BID) | ORAL | 3 refills | Status: DC

## 2023-10-13 MED ORDER — METOPROLOL SUCCINATE ER 25 MG PO TB24: 25.0000 mg | ORAL_TABLET | Freq: Two times a day (BID) | ORAL | 3 refills | Status: DC

## 2023-10-13 NOTE — Progress Notes (Signed)
 Electrophysiology Office Note:   Date:  10/13/2023  ID:  Tiffany Caldwell, DOB July 28, 1941, MRN [id number]  Primary Cardiologist: Lonni Hanson, MD Primary Heart Failure: None Electrophysiologist: Fonda Kitty, MD      History of Present Illness:   Tiffany Caldwell is a 83 y.o. female with h/o CAD status post remote PCI with subsequent emergent three-vessel CABG for iatrogenic left main/LAD dissection in 2001, right lower lobe cavitary lung mass, labile hypertension, hyperlipidemia, chronic diastolic heart failure, left-sided breast cancer, COPD, IBS, GERD who presents for  seen today for evaluation of PVCs.   Patient was seen by general cardiology in October 2024 and was reporting some SOB and fatigue at that visit. EKG was done and showed sinus rhythm with PVCs. A Zio monitor was then ordered which revealed a PVC burden of 22.7%, for which she has been referred for EP evaluation. Today, she reports doing relatively well. She continues to have baseline shortness of breath which is attributed to her COPD and emphysema. She went to ED on 12/25 for fatigue and confusion. She was discharged same day without any obvious cause despite thorough workup. She denies any history of palpitations/fluttering/skipping beats in chest.   Review of systems complete and found to be negative unless listed in HPI.   EP Information / Studies Reviewed:    EKG is ordered today. Personal review as below.  EKG Interpretation Date/Time:  Tuesday October 13 2023 15:10:39 EST Ventricular Rate:  63 PR Interval:  172 QRS Duration:  102 QT Interval:  466 QTC Calculation: 476 R Axis:   -33  Text Interpretation: Sinus rhythm with occasional Premature ventricular complexes Premature atrial complexes Left ventricular hypertrophy with repolarization abnormality ( R in aVL , Sokolow-Lyon , Cornell product , Romhilt-Estes ) When compared with ECG of 01-Oct-2023 03:06,no significant change. Confirmed by Kitty Fonda  [id number]) on 10/13/2023 5:33:29 PM   Zio Monitor 08/03/23:    The patient was monitored for 13 days, 23 hours.   The predominant rhythm was sinus with an average rate of 62 bpm (range 42-86 bpm in sinus).   There were occasional PACs (4.2% burden) and frequent PVCs (22.7% burden).   3 episodes of nonsustained ventricular tachycardia occurred, lasting up to 9 beats with a maximum rate of 176 bpm.   There were 41 supraventricular runs, lasting up to 16 beats with a maximum rate of 164 bpm.   No sustained arrhythmia or prolonged pause was observed.   There were no patient triggered events.   Predominantly sinus rhythm with frequent PVCs and occasional PACs.  Three episodes of NSVT and multiple supraventricular runs were observed, as detailed above.  Echo 05/12/23:   1. No LVOT obstruction.. Left ventricular ejection fraction, by  estimation, is 55 to 60%. The left ventricle has normal function. The left  ventricle has no regional wall motion abnormalities. There is severe  asymmetric left ventricular hypertrophy of  the basal-septal segment. Left ventricular diastolic parameters are  consistent with Grade II diastolic dysfunction (pseudonormalization).   2. Right ventricular systolic function is low normal. The right  ventricular size is normal. There is moderately elevated pulmonary artery  systolic pressure.   3. Left atrial size was severely dilated.   4. Right atrial size was mild to moderately dilated.   5. The mitral valve is normal in structure. Mild to moderate mitral valve  regurgitation.   6. The aortic valve is tricuspid. Aortic valve regurgitation is mild.  Aortic valve sclerosis is present, with no  evidence of aortic valve  stenosis.   7. The inferior vena cava is dilated in size with >50% respiratory  variability, suggesting right atrial pressure of 8 mmHg.   LHC/RHC 06/17/23:  Conclusions: Severe single-vessel coronary artery disease with chronic total occlusion of  proximal/mid LAD stent, with mid/distal LAD supplied by patent LIMA graft.  There is also 60-70% stenosis at the ostium of OM1, 50% mid LCx disease, and 30% proximal RCA stenosis. Widely patent LIMA-LAD. Chronically occluded SVG-D1 and SVG-OM1. Normal left and right heart filling pressures (LVEDP 14 mmHg, mean RA 3 mmHg, RVEDP 6 mmHg). Mild to moderate pulmonary hypertension (PA 50/14, mean 26 mmHg). Normal Fick cardiac output/index (CO 5.3 L/min, CI 3.2 L/min/m).   Recommendations: Continue follow-up with pulmonology for management of chronic lung disease that is most likely driving Tiffany Caldwell's chronic exertional dyspnea and mild to moderate pulmonary hypertension.  If symptoms persist, 1 could consider cardiac MRI to better evaluate severe asymmetric left ventricular hypertrophy as well as consultation with the advanced heart failure service. Aggressive secondary prevention of coronary artery disease.  Favor medical management of 60-70% ostial OM1 and 50% mid LCx disease.   Physical Exam:   VS:  BP 128/68 (BP Location: Left Arm, Patient Position: Sitting, Cuff Size: Normal)   Pulse 63   Ht 5' 5 (1.651 m)   Wt 123 lb (55.8 kg)   LMP  (LMP Unknown)   SpO2 96%   BMI 20.47 kg/m    Wt Readings from Last 3 Encounters:  10/13/23 123 lb (55.8 kg)  09/30/23 127 lb (57.6 kg)  09/08/23 127 lb 3.2 oz (57.7 kg)     GEN: Well nourished, well developed in no acute distress NECK: No JVD CARDIAC: Normal rate and irregular rhythm RESPIRATORY:  Clear to auscultation without rales, wheezing or rhonchi  ABDOMEN: Soft, non-tender, non-distended EXTREMITIES:  No edema; No deformity   ASSESSMENT AND PLAN:   Tiffany Caldwell is a 83 y.o. female with h/o CAD status post remote PCI with subsequent emergent three-vessel CABG for iatrogenic left main/LAD dissection in 2001, right lower lobe cavitary lung mass, labile hypertension, hyperlipidemia, chronic diastolic heart failure, left-sided breast cancer,  COPD, IBS, GERD who presents for  seen today for evaluation of PVCs.   Patient recently wore Zio monitor which showed PVC burden of 22.7% and PAC burden 4.2%. It is unclear to what extent she is symptomatic. She denies palpitations and has baseline shortness of breath. Importantly, her LVEF is normal on last echocardiogram. Cardiac MRI is pending. If LVEF remains normal and patient is not overtly symptomatic then we do not have to be aggressive with PVC suppression; she is not an ablation candidate due to age and co-morbidities. A PVC burden of >20% does correlate with increased risk of developing a PVC induced cardiomyopathy. For this reason, I think it is reasonable to try and suppress PVCs to some degree with more conservative measures, such as optimizing beta blocker and starting mexiletine. Would like to avoid amiodarone given underlying lung disease but if she were to develop a PVC induced cardiomyopathy than sotalol or amiodarone would be the next option.  #. Frequent unifocal PVCs: Appears to be from AL pap muscle. RBBB in V1 with right inferior axis.  - Start mexiletine 150mg  twice daily.  - Change carvedilol  to metoprolol  XL 25mg  twice daily.  - Repeat Zio monitor in 1 month to assess burden.  - Cardiac MRI is pending.   #. PACs:  - Change carvedilol  to  metoprolol  XL 25mg  twice daily.   #. Chronic diastolic heart failure:  #. CAD s/p CABG - Appears relatively well compensated today.  - Continue current regimen and follow up with general cardiologist.  Follow up with Dr. Kennyth  in 8 weeks.    Total time of encounter: 67 minutes total time of encounter, including chart review, face-to-face patient care, coordination of care and counseling regarding high complexity medical decision making.   Signed, Fonda Kennyth, MD

## 2023-10-13 NOTE — Patient Instructions (Addendum)
 Medication Instructions:  Your physician has recommended you make the following change in your medication:  1) STOP taking carvedilol   2) START taking metoprolol  succinate (Toprol  XL) 25 mg twice daily  3) START taking mexiletine 150 mg twice daily   *If you need a refill on your cardiac medications before your next appointment, please call your pharmacy*  Testing/Procedures: IN ONE MONTH: Your physician has recommended that you wear an event monitor. Event monitors are medical devices that record the heart's electrical activity. Doctors most often us  these monitors to diagnose arrhythmias. Arrhythmias are problems with the speed or rhythm of the heartbeat. The monitor is a small, portable device. You can wear one while you do your normal daily activities. This is usually used to diagnose what is causing palpitations/syncope (passing out).  Follow-Up: At [facility], you and your health needs are our priority.  As part of our continuing mission to provide you with exceptional heart care, we have created designated Provider Care Teams.  These Care Teams include your primary Cardiologist (physician) and Advanced Practice Providers (APPs -  Physician Assistants and Nurse Practitioners) who all work together to provide you with the care you need, when you need it.  Your next appointment:   3 months  Provider:   Fonda Kitty, MD    Other Instructions GEOFFRY HEWS- Long Term Monitor Instructions  Your physician has requested you wear a ZIO patch monitor for 14 days.  This is a single patch monitor. [employer] supplies one patch monitor per enrollment. Additional stickers are not available. Please do not apply patch if you will be having a Nuclear Stress Test,  Echocardiogram, Cardiac CT, MRI, or Chest Xray during the period you would be wearing the  monitor. The patch cannot be worn during these tests. You cannot remove and re-apply the  ZIO XT patch monitor.  Your ZIO patch monitor will be  mailed 3 day USPS to your address on file. It may take 3-5 days  to receive your monitor after you have been enrolled.  Once you have received your monitor, please review the enclosed instructions. Your monitor  has already been registered assigning a specific monitor serial # to you.  Billing and Patient Assistance Program Information  We have supplied [employer] with any of your insurance information on file for billing purposes. [employer] offers a sliding scale Patient Assistance Program for patients that do not have  insurance, or whose insurance does not completely cover the cost of the ZIO monitor.  You must apply for the Patient Assistance Program to qualify for this discounted rate.  To apply, please call [employer] at [phone], select option 4, select option 2, ask to apply for  Patient Assistance Program. Meredeth will ask your household income, and how many people  are in your household. They will quote your out-of-pocket cost based on that information.  [employer] will also be able to set up a 1-month, interest-free payment plan if needed.  Applying the monitor   Shave hair from upper left chest.  Hold abrader disc by orange tab. Rub abrader in 40 strokes over the upper left chest as  indicated in your monitor instructions.  Clean area with 4 enclosed alcohol pads. Let dry.  Apply patch as indicated in monitor instructions. Patch will be placed under collarbone on left  side of chest with arrow pointing upward.  Rub patch adhesive wings for 2 minutes. Remove white label marked 1. Remove the white  label marked 2. Rub patch adhesive  wings for 2 additional minutes.  While looking in a mirror, press and release button in center of patch. A small green light will  flash 3-4 times. This will be your only indicator that the monitor has been turned on.  Do not shower for the first 24 hours. You may shower after the first 24 hours.  Press the button if you feel a symptom. You will hear  a small click. Record Date, Time and  Symptom in the Patient Logbook.  When you are ready to remove the patch, follow instructions on the last 2 pages of Patient  Logbook. Stick patch monitor onto the last page of Patient Logbook.  Place Patient Logbook in the blue and white box. Use locking tab on box and tape box closed  securely. The blue and white box has prepaid postage on it. Please place it in the mailbox as  soon as possible. Your physician should have your test results approximately 7 days after the  monitor has been mailed back to [facility].  Call [facility] Customer Care at [phone] if you have questions regarding  your ZIO XT patch monitor. Call them immediately if you see an orange light blinking on your  monitor.  If your monitor falls off in less than 4 days, contact our Monitor department at [phone].  If your monitor becomes loose or falls off after 4 days call [employer] at [phone] for  suggestions on securing your monitor

## 2023-10-23 ENCOUNTER — Ambulatory Visit: Payer: PPO | Attending: Medical | Admitting: Medical

## 2023-10-23 ENCOUNTER — Encounter: Payer: Self-pay | Admitting: Medical

## 2023-10-23 VITALS — BP 130/72 | HR 60 | Ht 65.0 in | Wt 122.6 lb

## 2023-10-23 DIAGNOSIS — I5032 Chronic diastolic (congestive) heart failure: Secondary | ICD-10-CM

## 2023-10-23 DIAGNOSIS — R7989 Other specified abnormal findings of blood chemistry: Secondary | ICD-10-CM | POA: Diagnosis not present

## 2023-10-23 DIAGNOSIS — I10 Essential (primary) hypertension: Secondary | ICD-10-CM | POA: Diagnosis not present

## 2023-10-23 DIAGNOSIS — I493 Ventricular premature depolarization: Secondary | ICD-10-CM | POA: Diagnosis not present

## 2023-10-23 DIAGNOSIS — I2510 Atherosclerotic heart disease of native coronary artery without angina pectoris: Secondary | ICD-10-CM

## 2023-10-23 DIAGNOSIS — I517 Cardiomegaly: Secondary | ICD-10-CM | POA: Diagnosis not present

## 2023-10-23 NOTE — Patient Instructions (Signed)
Medication Instructions:  Your physician recommends that you continue on your current medications as directed. Please refer to the Current Medication list given to you today.   *If you need a refill on your cardiac medications before your next appointment, please call your pharmacy*   Lab Work: No labs ordered today    Testing/Procedures: No test ordered today    Follow-Up: At [facility], you and your health needs are our priority.  As part of our continuing mission to provide you with exceptional heart care, we have created designated Provider Care Teams.  These Care Teams include your primary Cardiologist (physician) and Advanced Practice Providers (APPs -  Physician Assistants and Nurse Practitioners) who all work together to provide you with the care you need, when you need it.  We recommend signing up for the patient portal called "MyChart".  Sign up information is provided on this After Visit Summary.  MyChart is used to connect with patients for Virtual Visits (Telemedicine).  Patients are able to view lab/test results, encounter notes, upcoming appointments, etc.  Non-urgent messages can be sent to your provider as well.   To learn more about what you can do with MyChart, go to [email or web address].    Your next appointment:   After Cardiac MRI  Provider:   Yvonne Kendall, MD

## 2023-10-23 NOTE — Progress Notes (Unsigned)
Cardiology Office Note:    Date:  10/23/2023   ID:  Tiffany Caldwell, DOB 1941-01-19, MRN [id number]  PCP:  Tiffany Officer, PA  [facility] Cardiologist:  Tiffany Kendall, MD  [facility] Electrophysiologist:  Tiffany Putnam, MD   Referring MD: Tiffany Caldwell, [place]   Chief Complaint: ER follow-up  History of Present Illness:    Tiffany Caldwell is a 83 y.o. female with a hx of CAD status post remote PCI with subsequent emergent three-vessel CABG for iatrogenic left main/LAD dissection in 2001, right lower lobe cavitary lung mass, labile hypertension, hyperlipidemia, HFpEF, left-sided breast cancer, COPD, IBS, GERD who presents for follow-up.   In 2001 patient underwent CABG x 3 with LIMA to LAD, SVG to diagonal, and SVG to OM.  Echo from 2016 showed EF of 55 to 60%, grade 1 diastolic dysfunction.  Lexiscan in 2020 showed no significant ischemia and was overall low risk.  Echo in 2020 showed EF of 65 to 70%, grade 1 diastolic dysfunction, mild AI. More recently, amlodipine stopped due to lower leg edema.  Patient was started on Lasix.    Echo in August 2024 showed EF of 55 to 60%, severe asymmetric LVH without LVOT obstruction, grade 2 diastolic dysfunction.  Left heart cath 06/2023 showed severe single-vessel CAD with CTO of the proximal to mid LAD stent, with mid distal LAD supplied by patent LIMA graft.  There was also 60 to 70% stenosis at the ostium of the OM1, 50% mid left circumflex disease, 30% proximal RCA stenosis.  Widely patent LIMA to LAD, chronically occluded SVG to D1 and SVG to OM 2.  Normal left and right heart filling pressures.  Mild to moderate pulmonary hypertension.  It was felt chronic lung disease was mostly driving patient's symptoms.  if symptoms persisted, recommended cardiac MRI  to evaluate severe asymmetric LVH.  Patient was seen in October 2024 reporting persistent breathing issues, felt to be from the lungs.  Heart monitor was ordered to evaluate PVC  burden. cMRI has not been completed. Heart monitor showed 22.7% PVC burden and she was referred to EP who stated her on mexiletine and changes coreg to metoprolol.  She had an ER visit for elevated BP. She felt very fatigued. BP 178/106  Today, she reports she has been feeling Ok since the ER. Energy is better. She has had some sleeping problems. BP at home has been mildly elevated. No chest pain. She has Cob from emphysema. No lightheadedness or dizziness. She ahs chonic lower leg edema.   Past Medical History:  Diagnosis Date   Allergy    Arthritis    Breast cancer (HCC)    left breast   Coronary artery disease 2001   CABG   DDD (degenerative disc disease), lumbosacral    Dyspnea    if rushing or climmbing lots of stairs   Emphysema lung (HCC)    emphysema   GERD (gastroesophageal reflux disease)    11/20/16- no a current problem   Hyperlipidemia    Hypertension    Irritable bowel syndrome (IBS)    Lung disorder    leison   Neuropathy    Pinched nerve    back and left leg   Pneumonia 2016    Past Surgical History:  Procedure Laterality Date   BREAST FIBROADENOMA SURGERY  01/13/1980   BREAST SURGERY  02/01/2004   tram/mastectomyfor recurrence   CARPOMETACARPEL SUSPENSION PLASTY Right 12/17/2017   Procedure: SUSPENSIONPLASTY RIGHT THUMB WITH EXCISION TRAPEZIUM;  Surgeon: Tiffany Salt, MD;  Location: [facility];  Service: Orthopedics;  Laterality: Right;   COLONOSCOPY     CORONARY ANGIOPLASTY  2001   had tear to two vessels- had to have open heart surgery   CORONARY ARTERY BYPASS GRAFT  2001   EYE SURGERY Bilateral    Cataract   KNEE ARTHROSCOPY  January 2011   right   MASTECTOMY PARTIAL / LUMPECTOMY W/ AXILLARY LYMPHADENECTOMY  04/10/1989   Dr Tiffany Caldwell / left breast   PARTIAL HYSTERECTOMY     vaginal   RIGHT/LEFT HEART CATH AND CORONARY ANGIOGRAPHY Bilateral 06/17/2023   Procedure: RIGHT/LEFT HEART CATH AND CORONARY ANGIOGRAPHY;  Surgeon: Tiffany Kendall, MD;   Location: [facility];  Service: Cardiovascular;  Laterality: Bilateral;   TENDON TRANSFER Right 12/17/2017   Procedure: ABDUCTOR POLLICIS LONGUS TRANSFER;  Surgeon: Tiffany Salt, MD;  Location:  SURGERY CENTER;  Service: Orthopedics;  Laterality: Right;   TONSILLECTOMY AND ADENOIDECTOMY     TOTAL HIP ARTHROPLASTY Right 08/05/2016   Procedure: TOTAL HIP ARTHROPLASTY ANTERIOR APPROACH;  Surgeon: Tiffany Corning, MD;  Location: [facility];  Service: Orthopedics;  Laterality: Right;   UPPER GASTROINTESTINAL ENDOSCOPY     VIDEO BRONCHOSCOPY WITH ENDOBRONCHIAL NAVIGATION N/A 11/27/2016   Procedure: VIDEO BRONCHOSCOPY WITH ENDOBRONCHIAL NAVIGATION;  Surgeon: Tiffany Slot, MD;  Location: [facility] OR;  Service: Thoracic;  Laterality: N/A;    Current Medications: Current Meds  Medication Sig   acetaminophen (TYLENOL) 500 MG tablet Take 1,000 mg by mouth 2 (two) times daily.    albuterol (VENTOLIN HFA) 108 (90 Base) MCG/ACT inhaler INHALE 1-2 PUFFS BY MOUTH EVERY 6 HOURS AS NEEDED FOR WHEEZING OR SHORTNESS OF BREATH (Patient taking differently: prn)   ANORO ELLIPTA 62.5-25 MCG/ACT AEPB INHALE 1 PUFF BY MOUTH INTO LUNGS BY MOUTH DAILY   aspirin EC 81 MG tablet Take 81 mg by mouth daily.   atorvastatin (LIPITOR) 20 MG tablet Take 1 tablet (20 mg total) by mouth daily.   Biotin 24401 MCG TABS Take 1 tablet by mouth daily.   fexofenadine (ALLEGRA) 180 MG tablet Take 180 mg by mouth daily.   fish oil-omega-3 fatty acids 1000 MG capsule Take 1 g by mouth daily.    furosemide (LASIX) 20 MG tablet Take 20 mg by mouth daily as needed.   gabapentin (NEURONTIN) 300 MG capsule Take 300 mg by mouth 3 (three) times daily.   Glucosamine-Chondroit-Vit C-Mn (GLUCOSAMINE CHONDR 1500 COMPLX PO) Take 1 tablet by mouth once.   metoprolol succinate (TOPROL XL) 25 MG 24 hr tablet Take 1 tablet (25 mg total) by mouth in the morning and at bedtime.   mexiletine (MEXITIL) 150 MG capsule Take 1 capsule (150 mg  total) by mouth 2 (two) times daily.   Multiple Vitamin (MULTIVITAMIN) tablet Take 1 tablet by mouth daily.       Allergies:   Amlodipine, Hydromorphone, Azithromycin, Ciprofloxacin, Hydromorphone hcl, Levofloxacin, and Lisinopril   Social History   Socioeconomic History   Marital status: Married    Spouse name: Not on file   Number of children: Not on file   Years of education: Not on file   Highest education level: Not on file  Occupational History   Not on file  Tobacco Use   Smoking status: Former    Current packs/day: 0.00    Types: Cigarettes    Start date: 08/01/1959    Quit date: 07/31/1989    Years since quitting: 34.2   Smokeless tobacco: Never  Vaping Use   Vaping status: Never Used  Substance and Sexual Activity   Alcohol use: Yes    Alcohol/week: 1.0 standard drink of alcohol    Types: 1 Glasses of wine per week    Comment: occasional wine   Drug use: No   Sexual activity: Not on file  Other Topics Concern   Not on file  Social History Narrative   Not on file   Social Drivers of Health   Financial Resource Strain: Not on file  Food Insecurity: Not on file  Transportation Needs: Not on file  Physical Activity: Not on file  Stress: Not on file  Social Connections: Not on file     Family History: The patient's family history includes Colon cancer in her paternal grandmother, paternal uncle, and son; Emphysema in her father; Heart disease in her mother.  ROS:   Please see the history of present illness.     All other systems reviewed and are negative.  EKGs/Labs/Other Studies Reviewed:    The following studies were reviewed today:  Heart monitor 08/2023    The patient was monitored for 13 days, 23 hours.   The predominant rhythm was sinus with an average rate of 62 bpm (range 42-86 bpm in sinus).   There were occasional PACs (4.2% burden) and frequent PVCs (22.7% burden).   3 episodes of nonsustained ventricular tachycardia occurred, lasting up  to 9 beats with a maximum rate of 176 bpm.   There were 41 supraventricular runs, lasting up to 16 beats with a maximum rate of 164 bpm.   No sustained arrhythmia or prolonged pause was observed.   There were no patient triggered events.   Predominantly sinus rhythm with frequent PVCs and occasional PACs.  Three episodes of NSVT and multiple supraventricular runs were observed, as detailed above.  R/L heart cath 06/2023 Conclusions: Severe single-vessel coronary artery disease with chronic total occlusion of proximal/mid LAD stent, with mid/distal LAD supplied by patent LIMA graft.  There is also 60-70% stenosis at the ostium of OM1, 50% mid LCx disease, and 30% proximal RCA stenosis. Widely patent LIMA-LAD. Chronically occluded SVG-D1 and SVG-OM1. Normal left and right heart filling pressures (LVEDP 14 mmHg, mean RA 3 mmHg, RVEDP 6 mmHg). Mild to moderate pulmonary hypertension (PA 50/14, mean 26 mmHg). Normal Fick cardiac output/index (CO 5.3 L/min, CI 3.2 L/min/m).   Recommendations: Continue follow-up with pulmonology for management of chronic lung disease that is most likely driving Ms. Dicson's chronic exertional dyspnea and mild to moderate pulmonary hypertension.  If symptoms persist, 1 could consider cardiac MRI to better evaluate severe asymmetric left ventricular hypertrophy as well as consultation with the advanced heart failure service. Aggressive secondary prevention of coronary artery disease.  Favor medical management of 60-70% ostial OM1 and 50% mid LCx disease.   Tiffany Kendall, MD [facility]  Echo 08/2022  1. No LVOT obstruction.. Left ventricular ejection fraction, by  estimation, is 55 to 60%. The left ventricle has normal function. The left  ventricle has no regional wall motion abnormalities. There is severe  asymmetric left ventricular hypertrophy of  the basal-septal segment. Left ventricular diastolic parameters are  consistent with Grade II diastolic  dysfunction (pseudonormalization).   2. Right ventricular systolic function is low normal. The right  ventricular size is normal. There is moderately elevated pulmonary artery  systolic pressure.   3. Left atrial size was severely dilated.   4. Right atrial size was mild to moderately dilated.   5. The  mitral valve is normal in structure. Mild to moderate mitral valve  regurgitation.   6. The aortic valve is tricuspid. Aortic valve regurgitation is mild.  Aortic valve sclerosis is present, with no evidence of aortic valve  stenosis.   7. The inferior vena cava is dilated in size with >50% respiratory  variability, suggesting right atrial pressure of 8 mmHg.     EKG:  EKG is ordered today.  The ekg ordered today demonstrates NSR 60bpm, LVH with repol  Recent Labs: 06/01/2023: ALT 31 07/07/2023: Magnesium 2.2; TSH 2.730 09/30/2023: BUN 23; Creatinine, Ser 0.80; Hemoglobin 14.4; Platelets 122; Potassium 4.3; Sodium 136  Recent Lipid Panel    Component Value Date/Time   CHOL 145 06/01/2023 1018   CHOL 155 12/08/2017 1045   TRIG 53 06/01/2023 1018   HDL 67 06/01/2023 1018   HDL 72 12/08/2017 1045   CHOLHDL 2.2 06/01/2023 1018   VLDL 11 06/01/2023 1018   LDLCALC 67 06/01/2023 1018   LDLCALC 67 12/08/2017 1045     Physical Exam:    VS:  BP 130/72 (BP Location: Left Arm, Patient Position: Sitting, Cuff Size: Normal)   Pulse 60   Ht 5\' 5"  (1.651 m)   Wt 122 lb 9.6 oz (55.6 kg)   LMP  (LMP Unknown)   SpO2 91% Comment: because she was walking  BMI 20.40 kg/m     Wt Readings from Last 3 Encounters:  10/23/23 122 lb 9.6 oz (55.6 kg)  10/13/23 123 lb (55.8 kg)  09/30/23 127 lb (57.6 kg)     GEN:  Well nourished, well developed in no acute distress HEENT: Normal NECK: No JVD; No carotid bruits LYMPHATICS: No lymphadenopathy CARDIAC: RRR, no murmurs, rubs, gallops RESPIRATORY:  Clear to auscultation without rales, wheezing or rhonchi  ABDOMEN: Soft, non-tender,  non-distended MUSCULOSKELETAL:  mild lower leg edema; No deformity  SKIN: Warm and dry NEUROLOGIC:  Alert and oriented x 3 PSYCHIATRIC:  Normal affect   ASSESSMENT:    1. Essential hypertension   2. LVH (left ventricular hypertrophy)   3. PVC (premature ventricular contraction)   4. Elevated troponin   5. Coronary artery disease involving native coronary artery of native heart without angina pectoris   6. Chronic diastolic heart failure (HCC)    PLAN:    In order of problems listed above:  HTN Patient was in the ER Christmas day for elevated BP. She felt weak and tired. Labs showed mildly elevated troponin. No chest pain was reported. BP slowly improved and she was discharged home. Since that time she says BP has been mildly elevated. May have been from lack of sleep. BP today 130/72. She is taking Toprol 25mg  daily. She is reluctant to add on another medication.   Severe LVH Echo showed normal LVEF with severe asymmetric LVH with basal-septal segment with no LVOT. cMRI has been scheduled.  PVC Heart monitor showed PVC burden>20% and was started on Mexiletine and coreg was changed to Toprol.   Elevated troponin CAD s/p CABG x 3 LHC in 06/2023 showed no PCI targets, normal right and left heart filling pressures, mild to moderate pulmonary HTN. She reports unchanged SOB from emphysema. Mildly elevated troponin in the setting of severely elevated BP. We are awaiting cMRI for LVH as above.   Lower leg edema HFpEF She has mild lower leg edema, she takes lasix as needed. Can consider daily hydrochlorothiazide, which would also help BP.  Disposition: Follow up in 2 month(s) with MD/APP  Signed, Tyshea Imel David Stall, PA-C  10/23/2023 2:57 PM    [facility]

## 2023-11-01 ENCOUNTER — Other Ambulatory Visit: Payer: Self-pay

## 2023-11-01 DIAGNOSIS — Z79899 Other long term (current) drug therapy: Secondary | ICD-10-CM | POA: Insufficient documentation

## 2023-11-01 DIAGNOSIS — I2510 Atherosclerotic heart disease of native coronary artery without angina pectoris: Secondary | ICD-10-CM | POA: Diagnosis not present

## 2023-11-01 DIAGNOSIS — J449 Chronic obstructive pulmonary disease, unspecified: Secondary | ICD-10-CM | POA: Insufficient documentation

## 2023-11-01 DIAGNOSIS — I10 Essential (primary) hypertension: Secondary | ICD-10-CM | POA: Diagnosis present

## 2023-11-01 LAB — BASIC METABOLIC PANEL
Anion gap: 11 (ref 5–15)
BUN: 20 mg/dL (ref 8–23)
CO2: 26 mmol/L (ref 22–32)
Calcium: 10.2 mg/dL (ref 8.9–10.3)
Chloride: 104 mmol/L (ref 98–111)
Creatinine, Ser: 0.82 mg/dL (ref 0.44–1.00)
GFR, Estimated: >60 mL/min (ref >60)
Glucose, Bld: 94 mg/dL (ref 70–99)
Potassium: 4.7 mmol/L (ref 3.5–5.1)
Sodium: 141 mmol/L (ref 135–145)

## 2023-11-01 LAB — CBC
HCT: 44.7 % (ref 36.0–46.0)
Hemoglobin: 14.3 g/dL (ref 12.0–15.0)
MCH: 32.8 pg (ref 26.0–34.0)
MCHC: 32 g/dL (ref 30.0–36.0)
MCV: 102.5 fL — ABNORMAL HIGH (ref 80.0–100.0)
Platelets: 128 10*3/uL — ABNORMAL LOW (ref 150–400)
RBC: 4.36 MIL/uL (ref 3.87–5.11)
RDW: 13.3 % (ref 11.5–15.5)
WBC: 3.7 10*3/uL — ABNORMAL LOW (ref 4.0–10.5)
nRBC: 0 % (ref 0.0–0.2)

## 2023-11-01 LAB — TROPONIN I (HIGH SENSITIVITY): Troponin I (High Sensitivity): 24 ng/L — ABNORMAL HIGH (ref <18)

## 2023-11-01 NOTE — ED Triage Notes (Signed)
Pt to ed from home via POV for HTN this week. Pt does take a beta blocker twice a day. Her normal BP runs 150s. Today it was 199 and she got concerned and came here. Pt has no other complaints at this time. Pt is caox4, in no acute distress in triage.

## 2023-11-02 ENCOUNTER — Emergency Department
Admission: EM | Admit: 2023-11-02 | Discharge: 2023-11-02 | Disposition: A | Discharge status: Home / Self Care | Payer: PPO | Attending: Emergency Medicine | Admitting: Emergency Medicine

## 2023-11-02 ENCOUNTER — Emergency Department: Payer: PPO

## 2023-11-02 DIAGNOSIS — I10 Essential (primary) hypertension: Secondary | ICD-10-CM

## 2023-11-02 LAB — TROPONIN I (HIGH SENSITIVITY): Troponin I (High Sensitivity): 26 ng/L — ABNORMAL HIGH (ref <18)

## 2023-11-02 MED ORDER — LEVOFLOXACIN 750 MG PO TABS: 750.0000 mg | ORAL_TABLET | Freq: Every day | ORAL | 0 refills | Status: DC

## 2023-11-02 NOTE — ED Provider Notes (Signed)
[facility] Provider Note    Event Date/Time   First MD Initiated Contact with Patient 11/02/23 [phone]     (approximate)   History   Hypertension   HPI  Tiffany Caldwell is a 83 y.o. female   Past medical history of former smoker with COPD, CAD, GERD, hypertension hyperlipidemia presents emergency department with high blood pressure.  She had her blood pressure medications adjusted recently and started on metoprolol twice daily and has been taking this medication for approximately 1 week but has been taking her blood pressure at home and it runs in the 150s systolic which worries her.  She otherwise feels well.  She has no acute medical complaints at all.  She denies headache, vision changes, motor or sensory changes, chest pain or shortness of breath.  No respiratory infectious symptoms.  She has been compliant with medications.   Independent Historian contributed to assessment above: Her husband at bedside to corroborate information past medical history as above    Physical Exam   Triage Vital Signs: ED Triage Vitals  Encounter Vitals Group     BP 11/01/23 2117 (!) 187/87     Systolic BP Percentile --      Diastolic BP Percentile --      Pulse Rate 11/01/23 2117 (!) 55     Resp 11/01/23 2117 16     Temp 11/01/23 2117 98 F (36.7 C)     Temp Source 11/01/23 2117 Oral     SpO2 11/01/23 2117 95 %     Weight 11/01/23 2115 121 lb 4.1 oz (55 kg)     Height 11/01/23 2115 5\' 5"  (1.651 m)     Head Circumference --      Peak Flow --      Pain Score 11/01/23 2115 0     Pain Loc --      Pain Education --      Exclude from Growth Chart --     Most recent vital signs: Vitals:   11/01/23 2117 11/02/23 0136  BP: (!) 187/87 (!) 169/90  Pulse: (!) 55 (!) 56  Resp: 16 16  Temp: 98 F (36.7 C) 97.7 F (36.5 C)  SpO2: 95% 92%    General: Awake, no distress.  CV:  Good peripheral perfusion.  Resp:  Normal effort.  Abd:  No distention.   Other:  Well-appearing woman in no acute distress.  Mildly hypertensive 160s over 90s, otherwise vital signs normal.  No facial asymmetry dysarthria or motor or sensory deficits.  A soft benign abdominal exam to deep palpation all quadrants and clear lungs, normal heart sounds.   ED Results / Procedures / Treatments   Labs (all labs ordered are listed, but only abnormal results are displayed) Labs Reviewed  CBC - Abnormal; Notable for the following components:      Result Value   WBC 3.7 (*)    MCV 102.5 (*)    Platelets 128 (*)    All other components within normal limits  TROPONIN I (HIGH SENSITIVITY) - Abnormal; Notable for the following components:   Troponin I (High Sensitivity) 24 (*)    All other components within normal limits  TROPONIN I (HIGH SENSITIVITY) - Abnormal; Notable for the following components:   Troponin I (High Sensitivity) 26 (*)    All other components within normal limits  BASIC METABOLIC PANEL  URINALYSIS, ROUTINE W REFLEX MICROSCOPIC     I ordered and reviewed the above labs they are notable  for white blood cell count is low today at 3.7 compared to December 4.2.  Her troponin has been stable in the mid 20s.  EKG  ED ECG REPORT I, Pilar Jarvis, the attending physician, personally viewed and interpreted this ECG.   Date: 11/02/2023  EKG Time: 2132  Rate: 52  Rhythm: sinus bradycardia  Axis: nl  Intervals:1st deg av block  ST&T Change: no stemi    RADIOLOGY I independently reviewed and interpreted chest x-ray and I see no obvious focality or pneumothorax I also reviewed radiologist's formal read.   PROCEDURES:  Critical Care performed: No  Procedures   MEDICATIONS ORDERED IN ED: Medications - No data to display  IMPRESSION / MDM / ASSESSMENT AND PLAN / ED COURSE  I reviewed the triage vital signs and the nursing notes.                                Patient's presentation is most consistent with acute presentation with potential  threat to life or bodily function.  Differential diagnosis includes, but is not limited to, hypertensive crisis, ACS, dissection, stroke, kidney dysfunction   MDM:     This is a patient with hypertension with recent medication adjustments who is hypertensive at home but otherwise asymptomatic.  Troponins, labs, all unremarkable and patient is mildly hypertensive here.  Anticipatory guidance was given and she will keep a journal log and follow-up with her doctor for her hypertension.  No signs of endorgan dysfunction.  Asymptomatic.  Can be discharged.  A chest x-ray was ordered from triage.  This shows focality in the right midlung concerning for pneumonia.  She has absolutely no respiratory infectious symptoms.  Given her COPD she is at risk for lung infection however.  We discussed the role of antibiotics and she would like to defer treatment given 0 symptoms currently.  I think this is appropriate.  I gave her an outpatient prescription as she will discuss with her doctor in the morning of chest x-ray findings and start if deemed appropriate.      FINAL CLINICAL IMPRESSION(S) / ED DIAGNOSES   Final diagnoses:  Uncontrolled hypertension     Rx / DC Orders   ED Discharge Orders          Ordered    levofloxacin (LEVAQUIN) 750 MG tablet  Daily        11/02/23 0528             Note:  This document was prepared using Dragon voice recognition software and may include unintentional dictation errors.    Pilar Jarvis, MD 11/02/23 [phone]

## 2023-11-02 NOTE — Discharge Instructions (Signed)
Take your resting blood pressure once daily and keep in a journal log to discuss with your doctor for high blood pressure medication management as needed.  Your chest x-ray showed some findings that may represent a pneumonia.  However since you are having no symptoms of pneumonia, we decided together to withhold antibiotic treatment today.  I did send an antibiotic to your pharmacy should you develop any symptoms of a lung infection like cough, increased sputum production, shortness of breath.  When you call your doctor tomorrow morning, please discuss this plan with them.   Thank you for choosing [place] for your health care today!  Please see your primary doctor this week for a follow up appointment.   If you have any new, worsening, or unexpected symptoms call your doctor right away or come back to the emergency department for reevaluation.  It was my pleasure to care for you today.   Daneil Dan Modesto Charon, MD

## 2023-11-04 ENCOUNTER — Other Ambulatory Visit: Payer: Self-pay | Admitting: Emergency Medicine

## 2023-11-04 ENCOUNTER — Telehealth: Payer: Self-pay | Admitting: Cardiology

## 2023-11-04 NOTE — Telephone Encounter (Signed)
Patient calling in about a medication that is not listed for her. Please advise

## 2023-11-04 NOTE — Telephone Encounter (Signed)
Called and spoke with patient. Patient states that she was started on Metoprolol XL in clinic. Patient states that her blood pressure was still elevated so there PCP has stopped her Metoprolol and is starting her on Coreg. Patient does not recall the dose and states that she will call back with the dose.

## 2023-11-04 NOTE — Telephone Encounter (Signed)
Pt c/o medication issue:  1. Name of Medication:   metoprolol succinate (TOPROL XL) 25 MG 24 hr tablet   2. How are you currently taking this medication (dosage and times per day)?  As prescribed  3. Are you having a reaction (difficulty breathing--STAT)?   4. What is your medication issue?   Patient stated she had been taking this medication for several days and it has not worked.  Patient called to report her PCP put her on carvedilol instead.

## 2023-11-05 ENCOUNTER — Other Ambulatory Visit: Payer: Self-pay

## 2023-11-05 ENCOUNTER — Emergency Department: Payer: PPO

## 2023-11-05 ENCOUNTER — Inpatient Hospital Stay
Admission: EM | Admit: 2023-11-05 | Discharge: 2023-11-08 | DRG: 291 | Disposition: A | Discharge status: Home / Self Care | Payer: PPO | Attending: Internal Medicine | Admitting: Internal Medicine

## 2023-11-05 ENCOUNTER — Encounter: Payer: Self-pay | Admitting: Intensive Care

## 2023-11-05 DIAGNOSIS — A419 Sepsis, unspecified organism: Principal | ICD-10-CM

## 2023-11-05 DIAGNOSIS — Z96641 Presence of right artificial hip joint: Secondary | ICD-10-CM | POA: Diagnosis present

## 2023-11-05 DIAGNOSIS — I10 Essential (primary) hypertension: Secondary | ICD-10-CM | POA: Diagnosis not present

## 2023-11-05 DIAGNOSIS — Z951 Presence of aortocoronary bypass graft: Secondary | ICD-10-CM | POA: Diagnosis not present

## 2023-11-05 DIAGNOSIS — N1831 Chronic kidney disease, stage 3a: Secondary | ICD-10-CM | POA: Diagnosis present

## 2023-11-05 DIAGNOSIS — I25119 Atherosclerotic heart disease of native coronary artery with unspecified angina pectoris: Secondary | ICD-10-CM | POA: Diagnosis not present

## 2023-11-05 DIAGNOSIS — I5A Non-ischemic myocardial injury (non-traumatic): Secondary | ICD-10-CM | POA: Diagnosis present

## 2023-11-05 DIAGNOSIS — Z79899 Other long term (current) drug therapy: Secondary | ICD-10-CM | POA: Diagnosis not present

## 2023-11-05 DIAGNOSIS — J9601 Acute respiratory failure with hypoxia: Secondary | ICD-10-CM | POA: Diagnosis present

## 2023-11-05 DIAGNOSIS — Z800 Family history of malignant neoplasm of digestive organs: Secondary | ICD-10-CM

## 2023-11-05 DIAGNOSIS — D696 Thrombocytopenia, unspecified: Secondary | ICD-10-CM | POA: Diagnosis present

## 2023-11-05 DIAGNOSIS — Z8619 Personal history of other infectious and parasitic diseases: Secondary | ICD-10-CM | POA: Diagnosis not present

## 2023-11-05 DIAGNOSIS — J439 Emphysema, unspecified: Secondary | ICD-10-CM | POA: Diagnosis present

## 2023-11-05 DIAGNOSIS — Z825 Family history of asthma and other chronic lower respiratory diseases: Secondary | ICD-10-CM

## 2023-11-05 DIAGNOSIS — Z8701 Personal history of pneumonia (recurrent): Secondary | ICD-10-CM

## 2023-11-05 DIAGNOSIS — Z9012 Acquired absence of left breast and nipple: Secondary | ICD-10-CM

## 2023-11-05 DIAGNOSIS — Z881 Allergy status to other antibiotic agents status: Secondary | ICD-10-CM

## 2023-11-05 DIAGNOSIS — I272 Pulmonary hypertension, unspecified: Secondary | ICD-10-CM | POA: Diagnosis present

## 2023-11-05 DIAGNOSIS — I491 Atrial premature depolarization: Secondary | ICD-10-CM | POA: Diagnosis present

## 2023-11-05 DIAGNOSIS — R531 Weakness: Secondary | ICD-10-CM | POA: Diagnosis present

## 2023-11-05 DIAGNOSIS — I493 Ventricular premature depolarization: Secondary | ICD-10-CM | POA: Diagnosis present

## 2023-11-05 DIAGNOSIS — E785 Hyperlipidemia, unspecified: Secondary | ICD-10-CM | POA: Diagnosis present

## 2023-11-05 DIAGNOSIS — I5032 Chronic diastolic (congestive) heart failure: Secondary | ICD-10-CM | POA: Diagnosis not present

## 2023-11-05 DIAGNOSIS — Z853 Personal history of malignant neoplasm of breast: Secondary | ICD-10-CM

## 2023-11-05 DIAGNOSIS — I5033 Acute on chronic diastolic (congestive) heart failure: Secondary | ICD-10-CM | POA: Diagnosis present

## 2023-11-05 DIAGNOSIS — J441 Chronic obstructive pulmonary disease with (acute) exacerbation: Secondary | ICD-10-CM | POA: Diagnosis not present

## 2023-11-05 DIAGNOSIS — Z7982 Long term (current) use of aspirin: Secondary | ICD-10-CM | POA: Diagnosis not present

## 2023-11-05 DIAGNOSIS — R6510 Systemic inflammatory response syndrome (SIRS) of non-infectious origin without acute organ dysfunction: Secondary | ICD-10-CM | POA: Diagnosis present

## 2023-11-05 DIAGNOSIS — Z90711 Acquired absence of uterus with remaining cervical stump: Secondary | ICD-10-CM

## 2023-11-05 DIAGNOSIS — G9341 Metabolic encephalopathy: Secondary | ICD-10-CM | POA: Diagnosis present

## 2023-11-05 DIAGNOSIS — J449 Chronic obstructive pulmonary disease, unspecified: Secondary | ICD-10-CM | POA: Diagnosis present

## 2023-11-05 DIAGNOSIS — Z888 Allergy status to other drugs, medicaments and biological substances status: Secondary | ICD-10-CM

## 2023-11-05 DIAGNOSIS — K8020 Calculus of gallbladder without cholecystitis without obstruction: Secondary | ICD-10-CM | POA: Diagnosis present

## 2023-11-05 DIAGNOSIS — Z885 Allergy status to narcotic agent status: Secondary | ICD-10-CM

## 2023-11-05 DIAGNOSIS — I2489 Other forms of acute ischemic heart disease: Secondary | ICD-10-CM | POA: Diagnosis present

## 2023-11-05 DIAGNOSIS — Z8249 Family history of ischemic heart disease and other diseases of the circulatory system: Secondary | ICD-10-CM | POA: Diagnosis not present

## 2023-11-05 DIAGNOSIS — Z9861 Coronary angioplasty status: Secondary | ICD-10-CM

## 2023-11-05 DIAGNOSIS — Z87891 Personal history of nicotine dependence: Secondary | ICD-10-CM

## 2023-11-05 DIAGNOSIS — J9621 Acute and chronic respiratory failure with hypoxia: Secondary | ICD-10-CM | POA: Diagnosis not present

## 2023-11-05 DIAGNOSIS — Z1152 Encounter for screening for COVID-19: Secondary | ICD-10-CM | POA: Diagnosis not present

## 2023-11-05 DIAGNOSIS — A310 Pulmonary mycobacterial infection: Secondary | ICD-10-CM | POA: Diagnosis present

## 2023-11-05 DIAGNOSIS — I130 Hypertensive heart and chronic kidney disease with heart failure and stage 1 through stage 4 chronic kidney disease, or unspecified chronic kidney disease: Secondary | ICD-10-CM | POA: Diagnosis present

## 2023-11-05 DIAGNOSIS — R112 Nausea with vomiting, unspecified: Secondary | ICD-10-CM | POA: Diagnosis present

## 2023-11-05 DIAGNOSIS — I2510 Atherosclerotic heart disease of native coronary artery without angina pectoris: Secondary | ICD-10-CM | POA: Diagnosis present

## 2023-11-05 LAB — BRAIN NATRIURETIC PEPTIDE: B Natriuretic Peptide: 855.4 pg/mL — ABNORMAL HIGH (ref 0.0–100.0)

## 2023-11-05 LAB — COMPREHENSIVE METABOLIC PANEL
ALT: 53 U/L — ABNORMAL HIGH (ref 0–44)
AST: 58 U/L — ABNORMAL HIGH (ref 15–41)
Albumin: 3.6 g/dL (ref 3.5–5.0)
Alkaline Phosphatase: 86 U/L (ref 38–126)
Anion gap: 12 (ref 5–15)
BUN: 28 mg/dL — ABNORMAL HIGH (ref 8–23)
CO2: 26 mmol/L (ref 22–32)
Calcium: 9.3 mg/dL (ref 8.9–10.3)
Chloride: 105 mmol/L (ref 98–111)
Creatinine, Ser: 0.98 mg/dL (ref 0.44–1.00)
GFR, Estimated: 57 mL/min — ABNORMAL LOW (ref >60)
Glucose, Bld: 125 mg/dL — ABNORMAL HIGH (ref 70–99)
Potassium: 4.4 mmol/L (ref 3.5–5.1)
Sodium: 143 mmol/L (ref 135–145)
Total Bilirubin: 1.2 mg/dL (ref 0.0–1.2)
Total Protein: 6.6 g/dL (ref 6.5–8.1)

## 2023-11-05 LAB — LACTIC ACID, PLASMA
Lactic Acid, Venous: 1.3 mmol/L (ref 0.5–1.9)
Lactic Acid, Venous: 1 mmol/L (ref 0.5–1.9)

## 2023-11-05 LAB — CBC
HCT: 44.7 % (ref 36.0–46.0)
Hemoglobin: 14.6 g/dL (ref 12.0–15.0)
MCH: 32.6 pg (ref 26.0–34.0)
MCHC: 32.7 g/dL (ref 30.0–36.0)
MCV: 99.8 fL (ref 80.0–100.0)
Platelets: 112 10*3/uL — ABNORMAL LOW (ref 150–400)
RBC: 4.48 MIL/uL (ref 3.87–5.11)
RDW: 13.7 % (ref 11.5–15.5)
WBC: 3.6 10*3/uL — ABNORMAL LOW (ref 4.0–10.5)
nRBC: 0 % (ref 0.0–0.2)

## 2023-11-05 LAB — RESP PANEL BY RT-PCR (RSV, FLU A&B, COVID)  RVPGX2
Influenza A by PCR: NEGATIVE
Influenza B by PCR: NEGATIVE
Resp Syncytial Virus by PCR: NEGATIVE
SARS Coronavirus 2 by RT PCR: NEGATIVE

## 2023-11-05 LAB — URINALYSIS, ROUTINE W REFLEX MICROSCOPIC
Bilirubin Urine: NEGATIVE
Glucose, UA: NEGATIVE mg/dL
Hgb urine dipstick: NEGATIVE
Ketones, ur: 5 mg/dL — AB
Leukocytes,Ua: NEGATIVE
Nitrite: NEGATIVE
Protein, ur: NEGATIVE mg/dL
Specific Gravity, Urine: 1.02 (ref 1.005–1.030)
pH: 5 (ref 5.0–8.0)

## 2023-11-05 LAB — APTT: aPTT: 30 s (ref 24–36)

## 2023-11-05 LAB — TROPONIN I (HIGH SENSITIVITY): Troponin I (High Sensitivity): 55 ng/L — ABNORMAL HIGH (ref <18)

## 2023-11-05 LAB — PROTIME-INR
INR: 1.3 — ABNORMAL HIGH (ref 0.8–1.2)
Prothrombin Time: 16.8 s — ABNORMAL HIGH (ref 11.4–15.2)

## 2023-11-05 LAB — LIPASE, BLOOD: Lipase: 44 U/L (ref 11–51)

## 2023-11-05 MED ORDER — SODIUM CHLORIDE 0.9 % IV SOLN
2.0000 g | Freq: Once | INTRAVENOUS | Status: AC
  Administered 2023-11-05: 2 g via INTRAVENOUS
  Filled 2023-11-05: qty 12.5

## 2023-11-05 MED ORDER — ACETAMINOPHEN 325 MG PO TABS
650.0000 mg | ORAL_TABLET | Freq: Four times a day (QID) | ORAL | Status: DC | PRN
  Administered 2023-11-05: 650 mg via ORAL
  Filled 2023-11-05: qty 2

## 2023-11-05 MED ORDER — METRONIDAZOLE 500 MG/100ML IV SOLN
500.0000 mg | Freq: Once | INTRAVENOUS | Status: AC
  Administered 2023-11-05: 500 mg via INTRAVENOUS
  Filled 2023-11-05: qty 100

## 2023-11-05 MED ORDER — HYDRALAZINE HCL 20 MG/ML IJ SOLN: 5.0000 mg | INTRAMUSCULAR | Status: DC | PRN

## 2023-11-05 MED ORDER — IOHEXOL 300 MG/ML  SOLN
100.0000 mL | Freq: Once | INTRAMUSCULAR | Status: AC | PRN
  Administered 2023-11-05: 100 mL via INTRAVENOUS

## 2023-11-05 MED ORDER — ALBUTEROL SULFATE (2.5 MG/3ML) 0.083% IN NEBU
2.5000 mg | INHALATION_SOLUTION | RESPIRATORY_TRACT | Status: DC | PRN
  Filled 2023-11-05: qty 3

## 2023-11-05 MED ORDER — DM-GUAIFENESIN ER 30-600 MG PO TB12: 1.0000 | ORAL_TABLET | Freq: Two times a day (BID) | ORAL | Status: DC | PRN

## 2023-11-05 MED ORDER — ENOXAPARIN SODIUM 40 MG/0.4ML IJ SOSY
40.0000 mg | PREFILLED_SYRINGE | INTRAMUSCULAR | Status: DC
Start: 2023-11-05 — End: 2023-11-08
  Administered 2023-11-06 – 2023-11-07 (×3): 40 mg via SUBCUTANEOUS
  Filled 2023-11-05 (×3): qty 0.4

## 2023-11-05 MED ORDER — SODIUM CHLORIDE 0.9 % IV SOLN: INTRAVENOUS | Status: DC

## 2023-11-05 MED ORDER — ONDANSETRON HCL 4 MG/2ML IJ SOLN
4.0000 mg | Freq: Three times a day (TID) | INTRAMUSCULAR | Status: DC | PRN
  Administered 2023-11-05: 4 mg via INTRAVENOUS
  Filled 2023-11-05: qty 2

## 2023-11-05 MED ORDER — VANCOMYCIN HCL IN DEXTROSE 1-5 GM/200ML-% IV SOLN
1000.0000 mg | Freq: Once | INTRAVENOUS | Status: AC
  Administered 2023-11-05: 1000 mg via INTRAVENOUS
  Filled 2023-11-05 (×2): qty 200

## 2023-11-05 NOTE — Sepsis Progress Note (Signed)
Elink monitoring for the code sepsis protocol.

## 2023-11-05 NOTE — ED Triage Notes (Signed)
Arrives from home via [facility]>  C/O emesis and feeling lethargic since last night. Last eaten yesterday evening at 2030.  VS wnl.  Per EMS report, patient is weak, needs assist to ambulate.

## 2023-11-05 NOTE — ED Notes (Signed)
Per pt spouse pt LKW 0900 11/05/2023 pt )2 88% RA pt placed on 2L O2 via [place] pt O2 increases to 97%

## 2023-11-05 NOTE — ED Triage Notes (Signed)
Patient arrived by EMS from home. reports emesis and fever. Husband states patient has been "lethargic"  Husband reports he was seen here earlier this week for emesis and nausea   Baseline uses walker. Husband reports unable to ambulate today with walker

## 2023-11-05 NOTE — ED Notes (Signed)
Patient transported to CT

## 2023-11-05 NOTE — ED Notes (Signed)
Report to Selena Batten RN for transfer from D pod room 40 to 19 ED

## 2023-11-05 NOTE — ED Provider Notes (Signed)
[facility] Provider Note    Event Date/Time   First MD Initiated Contact with Patient 11/05/23 1837     (approximate)   History   Emesis, Fever, and Altered Mental Status   HPI  Tiffany Caldwell is a 83 y.o. female with history of pulmonary hypertension, coronary disease who comes in with concerns for lethargy, decreased energy, nausea, vomiting.  Symptoms started last night and then going on today.  They state that she has been very sleepy she will wake up but seems way more confused than normal.  No neck stiffness.  She was hypoxic to 86% and placed on 2 L.  No significant abdominal pain that they are aware of.  She is having difficulty standing up but denies any falls.  Physical Exam   Triage Vital Signs: ED Triage Vitals  Encounter Vitals Group     BP 11/05/23 1443 (!) 180/83     Systolic BP Percentile --      Diastolic BP Percentile --      Pulse Rate 11/05/23 1443 85     Resp 11/05/23 1443 16     Temp 11/05/23 1443 98.7 F (37.1 C)     Temp Source 11/05/23 1443 Oral     SpO2 11/05/23 1443 90 %     Weight 11/05/23 1451 125 lb (56.7 kg)     Height 11/05/23 1451 5\' 5"  (1.651 m)     Head Circumference --      Peak Flow --      Pain Score 11/05/23 1451 0     Pain Loc --      Pain Education --      Exclude from Growth Chart --     Most recent vital signs: Vitals:   11/05/23 1443 11/05/23 1820  BP: (!) 180/83 (!) 176/79  Pulse: 85 85  Resp: 16 15  Temp: 98.7 F (37.1 C) 100.3 F (37.9 C)  SpO2: 90% 90%     General: Awake, no distress.  CV:  Good peripheral perfusion.  Resp:  Normal effort.  Abd:  No distention.  Other:  Alert and oriented x 1.  No neck stiffness.  Patient is sleepy but able to wake up.   ED Results / Procedures / Treatments   Labs (all labs ordered are listed, but only abnormal results are displayed) Labs Reviewed  COMPREHENSIVE METABOLIC PANEL - Abnormal; Notable for the following components:      Result  Value   Glucose, Bld 125 (*)    BUN 28 (*)    AST 58 (*)    ALT 53 (*)    GFR, Estimated 57 (*)    All other components within normal limits  CBC - Abnormal; Notable for the following components:   WBC 3.6 (*)    Platelets 112 (*)    All other components within normal limits  URINALYSIS, ROUTINE W REFLEX MICROSCOPIC - Abnormal; Notable for the following components:   Color, Urine YELLOW (*)    APPearance CLEAR (*)    Ketones, ur 5 (*)    All other components within normal limits  RESP PANEL BY RT-PCR (RSV, FLU A&B, COVID)  RVPGX2  LIPASE, BLOOD  TROPONIN I (HIGH SENSITIVITY)     EKG  My interpretation of EKG:   A-fib with a rate of 104 without any ST elevation or T wave inversions, normal intervals  RADIOLOGY  I reviewed the CT head personally interpreted no evidence of intercranial hemorrhage   PROCEDURES:  Critical Care performed: Yes, see critical care procedure note(s)  Procedures   MEDICATIONS ORDERED IN ED: Medications  acetaminophen (TYLENOL) tablet 650 mg (has no administration in time range)  iohexol (OMNIPAQUE) 300 MG/ML solution 100 mL (100 mLs Intravenous Contrast Given 11/05/23 1951)     IMPRESSION / MDM / ASSESSMENT AND PLAN / ED COURSE  I reviewed the triage vital signs and the nursing notes.   Patient's presentation is most consistent with acute presentation with potential threat to life or bodily function.   Patient comes in febrile with weakness.  Will get CT head given alert and oriented x 1 to ensure no in cranial hemorrhage.  Patient does not look meningitic full range of motion of neck.  Will get CT chest abdomen pelvis to look for pneumonia, acute abdominal process.  Lactate is normal.  COVID, flu are negative LFTs slightly elevated but not significantly tender.  Patient meets sepsis with low white count and fever therefore sepsis alert will be called.  Urine without evidence of UTI.  Patient does have some swelling in her legs and she does  have a normal lactate so I will hold off on fluid resuscitation  Lactate normal.  COVID, flu negative.  CMP shows slightly elevated LFTs.  CBC slightly low white count.  CT imaging overall reassuring.  She does have cholelithiasis I will get ultrasound to make sure no signs of cholecystitis given her slightly elevated LFTs.   I suspect this is most likely viral given family report that they had similar symptoms as well.  She is also hypoxic so seems more likely lung related.  At this time she is not meningitic she is got supple neck and easily wakes up and answers questions.  I will discuss with the hospitalist for admission and broad-spectrum antibiotics have been started   FINAL CLINICAL IMPRESSION(S) / ED DIAGNOSES   Final diagnoses:  Sepsis, due to unspecified organism, unspecified whether acute organ dysfunction present [facility])     Rx / DC Orders   ED Discharge Orders     None        Note:  This document was prepared using Dragon voice recognition software and may include unintentional dictation errors.   Concha Se, MD 11/05/23 2249

## 2023-11-05 NOTE — ED Provider Triage Note (Signed)
Emergency Medicine Provider Triage Evaluation Note  Tiffany Caldwell, a 83 y.o. female  was evaluated in triage.  Pt complains of lethargy and decreased energy.  Patient has also been evaluated earlier this week for reports of nausea and vomiting.  Patient is had increased weakness, and been unable to ambulate with her walker at home.  She was provided by the patient's husband and adult daughter.  She presents to the ED via EMS from home.  Review of Systems  Positive: Weakness, NV Negative: FCS  Physical Exam  BP (!) 180/83 (BP Location: Right Arm)   Pulse 85   Temp 98.7 F (37.1 C) (Oral)   Resp 16   Ht 5\' 5"  (1.651 m)   Wt 56.7 kg   LMP  (LMP Unknown)   SpO2 90%   BMI 20.80 kg/m  Gen:   Awake, no distress  NAD Resp:  Normal effort  MSK:   Moves extremities without difficulty  Other:    Medical Decision Making  Medically screening exam initiated at 5:49 PM.  Appropriate orders placed.  Tiffany Caldwell was informed that the remainder of the evaluation will be completed by another provider, this initial triage assessment does not replace that evaluation, and the importance of remaining in the ED until their evaluation is complete.  Geriatric patient to the ED for evaluation of weakness.    Tiffany Hoard, PA-C 11/05/23 1805

## 2023-11-05 NOTE — ED Notes (Signed)
Room air sats running 86-90%.  Planned to place patient on 2l/ [place]. Patient eD room became available and will put pt on 2l/ [place] once in room.

## 2023-11-05 NOTE — H&P (Signed)
History and Physical    Tiffany Caldwell:[id number] DOB: 20-Dec-1940 DOA: 11/05/2023  Referring MD/NP/PA:   PCP: Delma Officer, PA   Patient coming from:  The patient is coming from home.     Chief Complaint: Confusion, nausea, vomiting, cough and shortness of breath, fever, chills.  HPI: Tiffany Caldwell is a 83 y.o. female with medical history significant of hypertension, hyperlipidemia, COPD, CAD, diastolic CHF, CKD-3A, thrombocytopenia, pulmonary MAI, IBS, gallstone, PVC/PAC, breast cancer, who presents with confusion, nausea, vomiting, cough, shortness of breath.  Patient has mild confusion, cannot provide limited medical history.  She has nausea, vomiting for more than 2 days, denies diarrhea or abdominal pain.  Patient has fever and chills.  She has dry cough and mild shortness breath, denies chest pain.  No symptoms of UTI.  Was seen in ED on 11/01/2022, and started Levaquin for possible pneumonia, but no improvement.  Will show patient in ED, patient is mildly confused, she is orientated to person and place, intermittently confused about time.  She moves all extremities normally.  No facial droop or slurred speech.  Data reviewed independently and ED Course: pt was found to have negative PCR for COVID, flu and RSV, WBC 0.6, renal function close to baseline, lactic acid 1.3, liver function (ALP 86, AST 58, ALT 53, total bilirubin 1.2). Ttemperature 100.7, blood pressure 167/75, heart rate 85, RR 16, oxygen saturation 86/90% on room air, which improved to 96% on 2 L oxygen.  Patient is admitted to telemetry bed as inpatient.  CT-head: 1. No acute intracranial process. 2. Minimal patchy hypodensity in the left parietal periventricular white matter, nonspecific, likely chronic ischemia   CT-abdomen/pelvis/chest CT of the chest: Stable thick walled cavitary lesion in the right lower lobe consistent with indolent inflammatory change.   No acute abnormality noted.   CT of  the abdomen and pelvis: Cholelithiasis without complicating factors.   Periportal lucency suspicious for mild underlying hepatic inflammation.   Diverticulosis without diverticulitis.    US-RUQ: Cholelithiasis without complicating factors.   EKG: I have personally reviewed.  Seem to be frequent PAC, QTc 476, LVH.   Review of Systems:   General: Has fevers, chills, no body weight gain, has poor appetite, has fatigue HEENT: no blurry vision, hearing changes or sore throat Respiratory: has dyspnea, coughing, no wheezing CV: no chest pain, no palpitations GI: has nausea, vomiting, no abdominal pain, diarrhea, constipation GU: no dysuria, burning on urination, increased urinary frequency, hematuria  Ext: no leg edema Neuro: no unilateral weakness, numbness, or tingling, no vision change or hearing loss. Has altered mental status. Skin: no rash, no skin tear. MSK: No muscle spasm, no deformity, no limitation of range of movement in spin Heme: No easy bruising.  Travel history: No recent long distant travel.   Allergy:  Allergies  Allergen Reactions   Amlodipine Swelling    Leg swelling and fatigue.   Hydromorphone Other (See Comments)    ALTERED MENTAL STATUS Altered mental status   Azithromycin Rash   Ciprofloxacin Nausea Only and Nausea And Vomiting   Hydromorphone Hcl Other (See Comments)    Altered mental status Altered mental status   Levofloxacin Nausea Only and Other (See Comments)   Lisinopril Other (See Comments)    Fatigue  Fatigue    Past Medical History:  Diagnosis Date   Allergy    Arthritis    Breast cancer (HCC)    left breast   Coronary artery disease 2001   CABG  DDD (degenerative disc disease), lumbosacral    Dyspnea    if rushing or climmbing lots of stairs   Emphysema lung (HCC)    emphysema   GERD (gastroesophageal reflux disease)    11/20/16- no a current problem   Hyperlipidemia    Hypertension    Irritable bowel syndrome (IBS)     Lung disorder    leison   Neuropathy    Pinched nerve    back and left leg   Pneumonia 2016    Past Surgical History:  Procedure Laterality Date   BREAST FIBROADENOMA SURGERY  01/13/1980   BREAST SURGERY  02/01/2004   tram/mastectomyfor recurrence   CARPOMETACARPEL SUSPENSION PLASTY Right 12/17/2017   Procedure: SUSPENSIONPLASTY RIGHT THUMB WITH EXCISION TRAPEZIUM;  Surgeon: Cindee Salt, MD;  Location: [facility];  Service: Orthopedics;  Laterality: Right;   COLONOSCOPY     CORONARY ANGIOPLASTY  2001   had tear to two vessels- had to have open heart surgery   CORONARY ARTERY BYPASS GRAFT  2001   EYE SURGERY Bilateral    Cataract   KNEE ARTHROSCOPY  January 2011   right   MASTECTOMY PARTIAL / LUMPECTOMY W/ AXILLARY LYMPHADENECTOMY  04/10/1989   Dr Maple Hudson / left breast   PARTIAL HYSTERECTOMY     vaginal   RIGHT/LEFT HEART CATH AND CORONARY ANGIOGRAPHY Bilateral 06/17/2023   Procedure: RIGHT/LEFT HEART CATH AND CORONARY ANGIOGRAPHY;  Surgeon: Yvonne Kendall, MD;  Location: [facility];  Service: Cardiovascular;  Laterality: Bilateral;   TENDON TRANSFER Right 12/17/2017   Procedure: ABDUCTOR POLLICIS LONGUS TRANSFER;  Surgeon: Cindee Salt, MD;  Location: [facility];  Service: Orthopedics;  Laterality: Right;   TONSILLECTOMY AND ADENOIDECTOMY     TOTAL HIP ARTHROPLASTY Right 08/05/2016   Procedure: TOTAL HIP ARTHROPLASTY ANTERIOR APPROACH;  Surgeon: Marcene Corning, MD;  Location: [facility] OR;  Service: Orthopedics;  Laterality: Right;   UPPER GASTROINTESTINAL ENDOSCOPY     VIDEO BRONCHOSCOPY WITH ENDOBRONCHIAL NAVIGATION N/A 11/27/2016   Procedure: VIDEO BRONCHOSCOPY WITH ENDOBRONCHIAL NAVIGATION;  Surgeon: Loreli Slot, MD;  Location[facility] OR;  Service: Thoracic;  Laterality: N/A;    Social History:  reports that she quit smoking about 34 years ago. Her smoking use included cigarettes. She started smoking about 64 years ago. She has never used  smokeless tobacco. She reports current alcohol use of about 1.0 standard drink of alcohol per week. She reports that she does not use drugs.  Family History:  Family History  Problem Relation Age of Onset   Emphysema Father        deceased   Heart disease Mother        deceased   Colon cancer Paternal Grandmother    Colon cancer Son    Colon cancer Paternal Uncle      Prior to Admission medications   Medication Sig Start Date End Date Taking? Authorizing Provider  acetaminophen (TYLENOL) 500 MG tablet Take 1,000 mg by mouth 2 (two) times daily.     [provider]  albuterol (VENTOLIN HFA) 108 (90 Base) MCG/ACT inhaler INHALE 1-2 PUFFS BY MOUTH EVERY 6 HOURS AS NEEDED FOR WHEEZING OR SHORTNESS OF BREATH Patient taking differently: prn 06/20/23   Leslye Peer, MD  aspirin EC 81 MG tablet Take 81 mg by mouth daily.    [provider]  atorvastatin (LIPITOR) 20 MG tablet Take 1 tablet (20 mg total) by mouth daily. 03/13/23   End, Cristal Deer, MD  Biotin [address] MCG TABS  Take 1 tablet by mouth daily.    [provider]  fexofenadine (ALLEGRA) 180 MG tablet Take 180 mg by mouth daily.    [provider]  fish oil-omega-3 fatty acids 1000 MG capsule Take 1 g by mouth daily.     [provider]  furosemide (LASIX) 20 MG tablet Take 20 mg by mouth daily as needed.    [provider]  gabapentin (NEURONTIN) 300 MG capsule Take 300 mg by mouth 3 (three) times daily.    [provider]  Glucosamine-Chondroit-Vit C-Mn (GLUCOSAMINE CHONDR 1500 COMPLX PO) Take 1 tablet by mouth once.    [provider]  levofloxacin (LEVAQUIN) 750 MG tablet Take 1 tablet (750 mg total) by mouth daily for 5 days. 11/02/23 11/07/23  Pilar Jarvis, MD  metoprolol succinate (TOPROL XL) 25 MG 24 hr tablet Take 1 tablet (25 mg total) by mouth in the morning and at bedtime. 10/13/23   Nobie Putnam, MD  mexiletine (MEXITIL) 150 MG capsule Take 1 capsule (150 mg  total) by mouth 2 (two) times daily. 10/13/23   Nobie Putnam, MD  Multiple Vitamin (MULTIVITAMIN) tablet Take 1 tablet by mouth daily.      [provider]  umeclidinium-vilanterol [facility] ELLIPTA) 62.5-25 MCG/ACT AEPB INHALE 1 PUFF INTO THE LUNGS BY MOUTH ONCE DAILY 11/04/23   Leslye Peer, MD    Physical Exam: Vitals:   11/06/23 0020 11/06/23 0101 11/06/23 0130 11/06/23 0200  BP: 129/73 131/78 (!) 132/54 115/63  Pulse: 66 (!) 105 60 62  Resp: (!) 21 (!) 28 (!) 23 (!) 26  Temp:  98.5 F (36.9 C)    TempSrc:  Oral    SpO2: 100% 99% 100% 100%  Weight:      Height:       General: Not in acute distress HEENT:       Eyes: PERRL, EOMI, no jaundice       ENT: No discharge from the ears and nose, no pharynx injection, no tonsillar enlargement.        Neck: No JVD, no bruit, no mass felt. Heme: No neck lymph node enlargement. Cardiac: S1/S2, RRR, No murmurs, No gallops or rubs. Respiratory: No rales, wheezing, rhonchi or rubs. GI: Soft, nondistended, nontender, no rebound pain, no organomegaly, BS present. GU: No hematuria Ext: No pitting leg edema bilaterally. 1+DP/PT pulse bilaterally. Musculoskeletal: No joint deformities, No joint redness or warmth, no limitation of ROM in spin. Skin: No rashes.  Neuro: Alert, oriented X3, cranial nerves II-XII grossly intact, moves all extremities normally. Muscle strength 5/5 in all extremities, sensation to light touch intact. Brachial reflex 2+ bilaterally. Knee reflex 1+ bilaterally. Negative Babinski's sign. Normal finger to nose test. Psych: Patient is not psychotic, no suicidal or hemocidal ideation.  Labs on Admission: I have personally reviewed following labs and imaging studies  CBC: Recent Labs  Lab 11/01/23 2120 11/05/23 1442  WBC 3.7* 3.6*  HGB 14.3 14.6  HCT 44.7 44.7  MCV 102.5* 99.8  PLT 128* 112*   Basic Metabolic Panel: Recent Labs  Lab 11/01/23 2120 11/05/23 1442  NA 141 143  K 4.7 4.4  CL 104 105  CO2  26 26  GLUCOSE 94 125*  BUN 20 28*  CREATININE 0.82 0.98  CALCIUM 10.2 9.3   GFR: Estimated Creatinine Clearance: 38.3 mL/min (by C-G formula based on SCr of 0.98 mg/dL). Liver Function Tests: Recent Labs  Lab 11/05/23 1442  AST 58*  ALT 53*  ALKPHOS 86  BILITOT 1.2  PROT 6.6  ALBUMIN 3.6   Recent Labs  Lab 11/05/23 1442  LIPASE 44   No results for input(s): "AMMONIA" in the last 168 hours. Coagulation Profile: Recent Labs  Lab 11/05/23 2220  INR 1.3*   Cardiac Enzymes: No results for input(s): "CKTOTAL", "CKMB", "CKMBINDEX", "TROPONINI" in the last 168 hours. BNP (last 3 results) No results for input(s): "PROBNP" in the last 8760 hours. HbA1C: No results for input(s): "HGBA1C" in the last 72 hours. CBG: No results for input(s): "GLUCAP" in the last 168 hours. Lipid Profile: No results for input(s): "CHOL", "HDL", "LDLCALC", "TRIG", "CHOLHDL", "LDLDIRECT" in the last 72 hours. Thyroid Function Tests: No results for input(s): "TSH", "T4TOTAL", "FREET4", "T3FREE", "THYROIDAB" in the last 72 hours. Anemia Panel: No results for input(s): "VITAMINB12", "FOLATE", "FERRITIN", "TIBC", "IRON", "RETICCTPCT" in the last 72 hours. Urine analysis:    Component Value Date/Time   COLORURINE YELLOW (A) 11/05/2023 1442   APPEARANCEUR CLEAR (A) 11/05/2023 1442   LABSPEC 1.020 11/05/2023 1442   PHURINE 5.0 11/05/2023 1442   GLUCOSEU NEGATIVE 11/05/2023 1442   HGBUR NEGATIVE 11/05/2023 1442   BILIRUBINUR NEGATIVE 11/05/2023 1442   KETONESUR 5 (A) 11/05/2023 1442   PROTEINUR NEGATIVE 11/05/2023 1442   NITRITE NEGATIVE 11/05/2023 1442   LEUKOCYTESUR NEGATIVE 11/05/2023 1442   Sepsis Labs: @LABRCNTIP (procalcitonin:4,lacticidven:4) ) Recent Results (from the past 240 hours)  Resp panel by RT-PCR (RSV, Flu A&B, Covid) Anterior Nasal Swab     Status: None   Collection Time: 11/05/23  2:55 PM   Specimen: Anterior Nasal Swab  Result Value Ref Range Status   SARS Coronavirus 2  by RT PCR NEGATIVE NEGATIVE Final    Comment: (NOTE) SARS-CoV-2 target nucleic acids are NOT DETECTED.  The SARS-CoV-2 RNA is generally detectable in upper respiratory specimens during the acute phase of infection. The lowest concentration of SARS-CoV-2 viral copies this assay can detect is 138 copies/mL. A negative result does not preclude SARS-Cov-2 infection and should not be used as the sole basis for treatment or other patient management decisions. A negative result may occur with  improper specimen collection/handling, submission of specimen other than nasopharyngeal swab, presence of viral mutation(s) within the areas targeted by this assay, and inadequate number of viral copies(<138 copies/mL). A negative result must be combined with clinical observations, patient history, and epidemiological information. The expected result is Negative.  Fact Sheet for Patients:  BloggerCourse.com  Fact Sheet for Healthcare Providers:  SeriousBroker.it  This test is no t yet approved or cleared by the [place] FDA and  has been authorized for detection and/or diagnosis of SARS-CoV-2 by FDA under an Emergency Use Authorization (EUA). This EUA will remain  in effect (meaning this test can be used) for the duration of the COVID-19 declaration under Section 564(b)(1) of the Act, 21 U.S.C.section 360bbb-3(b)(1), unless the authorization is terminated  or revoked sooner.       Influenza A by PCR NEGATIVE NEGATIVE Final   Influenza B by PCR NEGATIVE NEGATIVE Final    Comment: (NOTE) The Xpert Xpress SARS-CoV-2/FLU/RSV plus assay is intended as an aid in the diagnosis of influenza from Nasopharyngeal swab specimens and should not be used as a sole basis for treatment. Nasal washings and aspirates are unacceptable for Xpert Xpress SARS-CoV-2/FLU/RSV testing.  Fact Sheet for Patients: BloggerCourse.com  Fact  Sheet for Healthcare Providers: SeriousBroker.it  This test is not yet approved or cleared by the [place] FDA and has been authorized for detection and/or diagnosis of  SARS-CoV-2 by FDA under an Emergency Use Authorization (EUA). This EUA will remain in effect (meaning this test can be used) for the duration of the COVID-19 declaration under Section 564(b)(1) of the Act, 21 U.S.C. section 360bbb-3(b)(1), unless the authorization is terminated or revoked.     Resp Syncytial Virus by PCR NEGATIVE NEGATIVE Final    Comment: (NOTE) Fact Sheet for Patients: BloggerCourse.com  Fact Sheet for Healthcare Providers: SeriousBroker.it  This test is not yet approved or cleared by the [place] FDA and has been authorized for detection and/or diagnosis of SARS-CoV-2 by FDA under an Emergency Use Authorization (EUA). This EUA will remain in effect (meaning this test can be used) for the duration of the COVID-19 declaration under Section 564(b)(1) of the Act, 21 U.S.C. section 360bbb-3(b)(1), unless the authorization is terminated or revoked.  Performed at [facility], [address] [place], [place] [address]      Radiological Exams on Admission:   Assessment/Plan Principal Problem:   SIRS (systemic inflammatory response syndrome) (HCC) Active Problems:   COPD (chronic obstructive pulmonary disease) (HCC)   Pulmonary Mycobacterium avium infection (HCC)   Coronary artery disease   Myocardial injury   Essential hypertension   Dyslipidemia   PAC (premature atrial contraction)   Frequent PVCs   Chronic diastolic CHF (congestive heart failure) (HCC)   Chronic kidney disease, stage 3a (HCC)   Thrombocytopenia (HCC)   Gallstone   Acute metabolic encephalopathy   Assessment and Plan:  SIRS (systemic inflammatory response syndrome) (HCC): Patient meets criteria for SIRS with WBC 3.6,  temperature 100.7.  Lactic acid is normal.  No source of infection identified so far.  Will treat patient as SIRS.  If source of infection is identified later, will change to sepsis.  Patient has cough and shortness of breath, with oxygen desaturation, suspecting respiratory viral infection.  Patient had negative PCR for flu, RSV and COVID.  Pending RVP.  Neck is supple, no meningeal signs, low suspicions for meningitis.  -Admit to telemetry bed as inpatient -Follow up RVP -Blood culture -Antibiotics: vancomycin and cefepime empirically (patient received 1 dose of Flagyl in ED) -Check procalcitonin level (lactic acid 1.3). -Nasal cannula oxygen to maintain oxygen saturation above 93%  COPD (chronic obstructive pulmonary disease) (HCC), no wheezing. -Bronchodilators and as needed Mucinex  History of pulmonary Mycobacterium avium infection (HCC):  -Seen to be stable by CT scan today  Chronic diastolic CHF: 2D echo on 05/12/2023 showed EF of 55 to 60% with grade 2 diastolic dysfunction.  Patient has elevated BNP 855, but no leg edema.  Due to sepsis, will hold off diuretics. -Watch volume status closely  Coronary artery disease and myocardial injury: Troponin minimally elevated 55.  Likely due to demand ischemia. -Aspirin, Lipitor  Essential hypertension -IV hydralazine as needed -Coreg  Dyslipidemia -Lipitor  PAC (premature atrial contraction) and  Frequent PVCs -Mexiletine and Coreg  Chronic kidney disease, stage 3a (HCC): Renal function stable. -Follow-up with BMP  Thrombocytopenia (HCC): Platelet 112, this is chronic issue. -Follow-up with CBC  Gallstone: Patient has a history of gallstone.  Post CT scan and right upper quadrant ultrasound did not show complicating factors. -Observe closely  Acute metabolic encephalopathy: Likely multifactorial etiology, particularly SIRS -Frequent neurocheck -Fall precaution       DVT ppx: SQ Lovenox  Code Status: Full code      Family Communication:     not done, no family member is at bed side.       Disposition  Plan:  Anticipate discharge back to previous environment  Consults called:  none  Admission status and Level of care: Telemetry Medical:    as inpt        Dispo: The patient is from: Home              Anticipated d/c is to: Home              Anticipated d/c date is: 2 days              Patient currently is not medically stable to d/c.    Severity of Illness:  The appropriate patient status for this patient is INPATIENT. Inpatient status is judged to be reasonable and necessary in order to provide the required intensity of service to ensure the patient's safety. The patient's presenting symptoms, physical exam findings, and initial radiographic and laboratory data in the context of their chronic comorbidities is felt to place them at high risk for further clinical deterioration. Furthermore, it is not anticipated that the patient will be medically stable for discharge from the hospital within 2 midnights of admission.   * I certify that at the point of admission it is my clinical judgment that the patient will require inpatient hospital care spanning beyond 2 midnights from the point of admission due to high intensity of service, high risk for further deterioration and high frequency of surveillance required.*       Date of Service 11/06/2023    Lorretta Harp [facility]   If 7PM-7AM, please contact night-coverage [email or web address] 11/06/2023, 2:27 AM

## 2023-11-05 NOTE — Consult Note (Signed)
CODE SEPSIS - PHARMACY COMMUNICATION  **Broad Spectrum Antibiotics should be administered within 1 hour of Sepsis diagnosis**  Time Code Sepsis Called/Page Received: 2022  Antibiotics Ordered: cefepime vancomycin flagyl  Time of 1st antibiotic administration: 2101  Additional action taken by pharmacy: none      Ronnald Ramp ,PharmD Clinical Pharmacist  11/05/2023  9:05 PM

## 2023-11-06 DIAGNOSIS — J441 Chronic obstructive pulmonary disease with (acute) exacerbation: Secondary | ICD-10-CM

## 2023-11-06 DIAGNOSIS — I5A Non-ischemic myocardial injury (non-traumatic): Secondary | ICD-10-CM | POA: Diagnosis present

## 2023-11-06 DIAGNOSIS — R6510 Systemic inflammatory response syndrome (SIRS) of non-infectious origin without acute organ dysfunction: Secondary | ICD-10-CM | POA: Diagnosis present

## 2023-11-06 LAB — BASIC METABOLIC PANEL
Anion gap: 10 (ref 5–15)
BUN: 25 mg/dL — ABNORMAL HIGH (ref 8–23)
CO2: 28 mmol/L (ref 22–32)
Calcium: 8.6 mg/dL — ABNORMAL LOW (ref 8.9–10.3)
Chloride: 105 mmol/L (ref 98–111)
Creatinine, Ser: 0.81 mg/dL (ref 0.44–1.00)
GFR, Estimated: >60 mL/min (ref >60)
Glucose, Bld: 94 mg/dL (ref 70–99)
Potassium: 3.7 mmol/L (ref 3.5–5.1)
Sodium: 143 mmol/L (ref 135–145)

## 2023-11-06 LAB — CBC
HCT: 40.4 % (ref 36.0–46.0)
Hemoglobin: 13.2 g/dL (ref 12.0–15.0)
MCH: 33.1 pg (ref 26.0–34.0)
MCHC: 32.7 g/dL (ref 30.0–36.0)
MCV: 101.3 fL — ABNORMAL HIGH (ref 80.0–100.0)
Platelets: 100 10*3/uL — ABNORMAL LOW (ref 150–400)
RBC: 3.99 MIL/uL (ref 3.87–5.11)
RDW: 14.1 % (ref 11.5–15.5)
WBC: 3.2 10*3/uL — ABNORMAL LOW (ref 4.0–10.5)
nRBC: 0 % (ref 0.0–0.2)

## 2023-11-06 LAB — MRSA NEXT GEN BY PCR, NASAL: MRSA by PCR Next Gen: NOT DETECTED

## 2023-11-06 MED ORDER — METOPROLOL SUCCINATE ER 25 MG PO TB24
25.0000 mg | ORAL_TABLET | Freq: Two times a day (BID) | ORAL | Status: DC
  Administered 2023-11-06 – 2023-11-08 (×4): 25 mg via ORAL
  Filled 2023-11-06 (×4): qty 1

## 2023-11-06 MED ORDER — GABAPENTIN 300 MG PO CAPS
300.0000 mg | ORAL_CAPSULE | Freq: Three times a day (TID) | ORAL | Status: DC
  Administered 2023-11-06 – 2023-11-08 (×7): 300 mg via ORAL
  Filled 2023-11-06 (×8): qty 1

## 2023-11-06 MED ORDER — UMECLIDINIUM-VILANTEROL 62.5-25 MCG/ACT IN AEPB
1.0000 | INHALATION_SPRAY | Freq: Every day | RESPIRATORY_TRACT | Status: DC
  Administered 2023-11-06 – 2023-11-08 (×3): 1 via RESPIRATORY_TRACT
  Filled 2023-11-06: qty 14

## 2023-11-06 MED ORDER — SODIUM CHLORIDE 0.9 % IV SOLN
2.0000 g | Freq: Two times a day (BID) | INTRAVENOUS | Status: DC
  Administered 2023-11-06: 2 g via INTRAVENOUS
  Filled 2023-11-06: qty 12.5

## 2023-11-06 MED ORDER — VANCOMYCIN HCL 750 MG/150ML IV SOLN: 750.0000 mg | INTRAVENOUS | Status: DC

## 2023-11-06 MED ORDER — MEXILETINE HCL 150 MG PO CAPS
150.0000 mg | ORAL_CAPSULE | Freq: Two times a day (BID) | ORAL | Status: DC
  Administered 2023-11-06 – 2023-11-08 (×6): 150 mg via ORAL
  Filled 2023-11-06 (×7): qty 1

## 2023-11-06 MED ORDER — ASPIRIN 81 MG PO TBEC
81.0000 mg | DELAYED_RELEASE_TABLET | Freq: Every day | ORAL | Status: DC
  Administered 2023-11-06 – 2023-11-08 (×3): 81 mg via ORAL
  Filled 2023-11-06 (×3): qty 1

## 2023-11-06 MED ORDER — ATORVASTATIN CALCIUM 20 MG PO TABS
20.0000 mg | ORAL_TABLET | Freq: Every day | ORAL | Status: DC
  Administered 2023-11-06 – 2023-11-08 (×3): 20 mg via ORAL
  Filled 2023-11-06 (×3): qty 1

## 2023-11-06 MED ORDER — ENSURE ENLIVE PO LIQD
237.0000 mL | Freq: Two times a day (BID) | ORAL | Status: DC
  Administered 2023-11-07 (×2): 237 mL via ORAL

## 2023-11-06 MED ORDER — NITROGLYCERIN 0.4 MG SL SUBL: 0.4000 mg | SUBLINGUAL_TABLET | SUBLINGUAL | Status: DC | PRN

## 2023-11-06 MED ORDER — CARVEDILOL 6.25 MG PO TABS
3.1250 mg | ORAL_TABLET | Freq: Two times a day (BID) | ORAL | Status: DC
  Administered 2023-11-06: 3.125 mg via ORAL
  Filled 2023-11-06: qty 1

## 2023-11-06 NOTE — Evaluation (Signed)
Clinical/Bedside Swallow Evaluation Patient Details  Name: Tiffany Caldwell MRN: [id number] Date of Birth: 1941/05/08  Today's Date: 11/06/2023 Time: SLP Start Time (ACUTE ONLY): 1415 SLP Stop Time (ACUTE ONLY): 1504 SLP Time Calculation (min) (ACUTE ONLY): 49 min  Past Medical History:  Past Medical History:  Diagnosis Date   Allergy    Arthritis    Breast cancer (HCC)    left breast   Coronary artery disease 2001   CABG   DDD (degenerative disc disease), lumbosacral    Dyspnea    if rushing or climmbing lots of stairs   Emphysema lung (HCC)    emphysema   GERD (gastroesophageal reflux disease)    11/20/16- no a current problem   Hyperlipidemia    Hypertension    Irritable bowel syndrome (IBS)    Lung disorder    leison   Neuropathy    Pinched nerve    back and left leg   Pneumonia 2016   Past Surgical History:  Past Surgical History:  Procedure Laterality Date   BREAST FIBROADENOMA SURGERY  01/13/1980   BREAST SURGERY  02/01/2004   tram/mastectomyfor recurrence   CARPOMETACARPEL SUSPENSION PLASTY Right 12/17/2017   Procedure: SUSPENSIONPLASTY RIGHT THUMB WITH EXCISION TRAPEZIUM;  Surgeon: Cindee Salt, MD;  Location: [facility];  Service: Orthopedics;  Laterality: Right;   COLONOSCOPY     CORONARY ANGIOPLASTY  2001   had tear to two vessels- had to have open heart surgery   CORONARY ARTERY BYPASS GRAFT  2001   EYE SURGERY Bilateral    Cataract   KNEE ARTHROSCOPY  January 2011   right   MASTECTOMY PARTIAL / LUMPECTOMY W/ AXILLARY LYMPHADENECTOMY  04/10/1989   Dr Maple Hudson / left breast   PARTIAL HYSTERECTOMY     vaginal   RIGHT/LEFT HEART CATH AND CORONARY ANGIOGRAPHY Bilateral 06/17/2023   Procedure: RIGHT/LEFT HEART CATH AND CORONARY ANGIOGRAPHY;  Surgeon: Yvonne Kendall, MD;  Location: [facility];  Service: Cardiovascular;  Laterality: Bilateral;   TENDON TRANSFER Right 12/17/2017   Procedure: ABDUCTOR POLLICIS LONGUS TRANSFER;  Surgeon:  Cindee Salt, MD;  Location: [facility];  Service: Orthopedics;  Laterality: Right;   TONSILLECTOMY AND ADENOIDECTOMY     TOTAL HIP ARTHROPLASTY Right 08/05/2016   Procedure: TOTAL HIP ARTHROPLASTY ANTERIOR APPROACH;  Surgeon: Marcene Corning, MD;  Location: [facility];  Service: Orthopedics;  Laterality: Right;   UPPER GASTROINTESTINAL ENDOSCOPY     VIDEO BRONCHOSCOPY WITH ENDOBRONCHIAL NAVIGATION N/A 11/27/2016   Procedure: VIDEO BRONCHOSCOPY WITH ENDOBRONCHIAL NAVIGATION;  Surgeon: Loreli Slot, MD;  Location: [facility];  Service: Thoracic;  Laterality: N/A;   HPI:  Per admitting H&P "HPI: [name] is a 83 y.o. female with medical history significant of hypertension, hyperlipidemia, COPD, CAD, diastolic CHF, CKD-3A, thrombocytopenia, pulmonary MAI, IBS, gallstone, PVC/PAC, breast cancer, who presents with confusion, nausea, vomiting, cough, shortness of breath.     Patient has mild confusion, cannot provide limited medical history.  She has nausea, vomiting for more than 2 days, denies diarrhea or abdominal pain.  Patient has fever and chills.  She has dry cough and mild shortness breath, denies chest pain.  No symptoms of UTI.  Was seen in ED on 11/01/2022, and started Levaquin for possible pneumonia, but no improvement.  Will show patient in ED, patient is mildly confused, she is orientated to person and place, intermittently confused about time.  She moves all extremities normally.  No facial droop or slurred speech.  Data reviewed independently and ED Course: pt was found to have negative PCR for COVID, flu and RSV, WBC 0.6, renal function close to baseline, lactic acid 1.3, liver function (ALP 86, AST 58, ALT 53, total bilirubin 1.2). Ttemperature 100.7, blood pressure 167/75, heart rate 85, RR 16, oxygen saturation 86/90% on room air, which improved to 96% on 2 L oxygen.  Patient is admitted to telemetry bed as inpatient.     CT-head:  1. No acute intracranial process.  2.  Minimal patchy hypodensity in the left parietal periventricular  white matter, nonspecific, likely chronic ischemia        CT-abdomen/pelvis/chest  CT of the chest: Stable thick walled cavitary lesion in the right  lower lobe consistent with indolent inflammatory change.     No acute abnormality noted.     CT of the abdomen and pelvis: Cholelithiasis without complicating  factors.     Periportal lucency suspicious for mild underlying hepatic  inflammation.     Diverticulosis without diverticulitis.  "    Assessment / Plan / Recommendation  Clinical Impression   Bedside swallow eval today reveals functional swallowing abilities with no overt s/s of aspiration noted. Pt apparently had some difficulty swallowing meds with noted coughing early this am with nursing. She has since tolerated her meds and meals well. Oral mech exam revealed structures to be functioning adequately with apparent weakness. Pt tolerated sips of water, purees, and solid foods without difficulty. Vocal quality remained clear and laryngeal elevation appeared adequate. Rec diet upgrade to Dys 3/ mechanical soft. Discussed importance of sitting upright for meals and meds. Pt reports she normally has no trouble taking her meds with water but can take them with applesauce if she has any further difficulty. Discussed with Nsg, Pt and family. No further ST follow up at this time. Please reconsult if further concerns arise. SLP Visit Diagnosis: Dysphagia, unspecified (R13.10)    Aspiration Risk  Mild aspiration risk    Diet Recommendation Dysphagia 3 (Mech soft)    Medication Administration: Whole meds with puree Compensations: Minimize environmental distractions;Slow rate Postural Changes: Seated upright at 90 degrees    Other  Recommendations   N/A   Recommendations for follow up therapy are one component of a multi-disciplinary discharge planning process, led by the attending physician.  Recommendations may be updated based on  patient status, additional functional criteria and insurance authorization.  Follow up Recommendations No SLP follow up      Assistance Recommended at Discharge  N/a  Functional Status Assessment  N/A  Frequency and Duration     No ST needed       Prognosis   Good     Swallow Study   General Date of Onset: 11/05/23 HPI: Per admitting H&P "HPI: Tiffany Caldwell is a 83 y.o. female with medical history significant of hypertension, hyperlipidemia, COPD, CAD, diastolic CHF, CKD-3A, thrombocytopenia, pulmonary MAI, IBS, gallstone, PVC/PAC, breast cancer, who presents with confusion, nausea, vomiting, cough, shortness of breath.     Patient has mild confusion, cannot provide limited medical history.  She has nausea, vomiting for more than 2 days, denies diarrhea or abdominal pain.  Patient has fever and chills.  She has dry cough and mild shortness breath, denies chest pain.  No symptoms of UTI.  Was seen in ED on 11/01/2022, and started Levaquin for possible pneumonia, but no improvement.  Will show patient in ED, patient is mildly confused, she is orientated to person and place, intermittently confused about  time.  She moves all extremities normally.  No facial droop or slurred speech.     Data reviewed independently and ED Course: pt was found to have negative PCR for COVID, flu and RSV, WBC 0.6, renal function close to baseline, lactic acid 1.3, liver function (ALP 86, AST 58, ALT 53, total bilirubin 1.2). Ttemperature 100.7, blood pressure 167/75, heart rate 85, RR 16, oxygen saturation 86/90% on room air, which improved to 96% on 2 L oxygen.  Patient is admitted to telemetry bed as inpatient.     CT-head:  1. No acute intracranial process.  2. Minimal patchy hypodensity in the left parietal periventricular  white matter, nonspecific, likely chronic ischemia        CT-abdomen/pelvis/chest  CT of the chest: Stable thick walled cavitary lesion in the right  lower lobe consistent with indolent  inflammatory change.     No acute abnormality noted.     CT of the abdomen and pelvis: Cholelithiasis without complicating  factors.     Periportal lucency suspicious for mild underlying hepatic  inflammation.     Diverticulosis without diverticulitis.  " Type of Study: Bedside Swallow Evaluation Previous Swallow Assessment: N/A Diet Prior to this Study: Dysphagia 2 (finely chopped) Respiratory Status: Room air History of Recent Intubation: No Behavior/Cognition: Alert;Cooperative;Pleasant mood Oral Cavity Assessment: Within Functional Limits Oral Care Completed by SLP: No Oral Cavity - Dentition: Adequate natural dentition Vision: Functional for self-feeding Self-Feeding Abilities: Able to feed self Patient Positioning: Upright in bed Baseline Vocal Quality: Normal Volitional Swallow: Able to elicit    Oral/Motor/Sensory Function Overall Oral Motor/Sensory Function: Within functional limits   Ice Chips Ice chips: Not tested   Thin Liquid Thin Liquid: Within functional limits Presentation: Cup;Spoon;Straw    Nectar Thick Nectar Thick Liquid: Not tested   Honey Thick Honey Thick Liquid: Not tested   Puree Puree: Within functional limits Presentation: Spoon   Solid     Solid: Within functional limits Presentation: Self Fed      Eather Colas 11/06/2023,3:04 PM

## 2023-11-06 NOTE — ED Notes (Signed)
Was going to give the pt her gabapentin but she declined and stated that she normally takes this medication twice daily instead of the ordered three times daily

## 2023-11-06 NOTE — Progress Notes (Addendum)
PROGRESS NOTE  Tiffany Caldwell    DOB: 1941/04/12, 83 y.o.  NWG:[id number]    Code Status: Full Code   DOA: 11/05/2023   LOS: 1   Brief hospital course  Tiffany Caldwell is a 83 y.o. female with medical history significant of hypertension, hyperlipidemia, COPD, CAD, diastolic CHF, CKD-3A, thrombocytopenia, pulmonary MAI, IBS, gallstone, PVC/PAC, breast cancer, who presents with confusion, nausea, vomiting, cough, shortness of breath. Was seen in ED on 11/01/2022, and started Levaquin for possible pneumonia, but no improvement.    ED Course 1/30: pt was found to have negative PCR for COVID, flu and RSV, WBC 0.6, renal function close to baseline, lactic acid 1.3, liver function (ALP 86, AST 58, ALT 53, total bilirubin 1.2). Temperature 100.7, blood pressure 167/75, heart rate 85, RR 16, oxygen saturation 86/90% on room air, which improved to 96% on 2 L oxygen.  Patient is admitted to telemetry bed as inpatient.   CT-head: 1. No acute intracranial process. 2. Minimal patchy hypodensity in the left parietal periventricular white matter, nonspecific, likely chronic ischemia   CT-abdomen/pelvis/chest CT of the chest: Stable thick walled cavitary lesion in the right lower lobe consistent with indolent inflammatory change. No acute abnormality noted. CT of the abdomen and pelvis: Cholelithiasis without complicating factors. Periportal lucency suspicious for mild underlying hepatic inflammation. Diverticulosis without diverticulitis.   US-RUQ: Cholelithiasis without complicating factors.   EKG: frequent PAC, QTc 476, LVH.  Patient was admitted to medicine service for further workup and management of COPD exacerbation as outlined in detail below.  11/06/23 -stable  Assessment & Plan  Principal Problem:   SIRS (systemic inflammatory response syndrome) (HCC) Active Problems:   COPD (chronic obstructive pulmonary disease) (HCC)   Pulmonary Mycobacterium avium infection (HCC)   Coronary  artery disease   Myocardial injury   Essential hypertension   Dyslipidemia   PAC (premature atrial contraction)   Frequent PVCs   Chronic diastolic CHF (congestive heart failure) (HCC)   Chronic kidney disease, stage 3a (HCC)   Thrombocytopenia (HCC)   Gallstone   Acute metabolic encephalopathy  SIRS (systemic inflammatory response syndrome) (HCC): Patient meets criteria for SIRS with WBC 3.6, temperature 100.7.  Lactic acid is normal.  No source of infection identified so far.  Suspect URVI/bronchitis.  RVP pending. Neg covid, RSV, flu. Was given vanc and cefepime in ED. Will not continue Abx at this time unless finding clear source of infection or worsens. LA normal x2. MRSA screening neg.  No baseline oxygen use - wean O2 as tolerated - f/u RVP - f/u BxCx   COPD (chronic obstructive pulmonary disease) (HCC), no wheezing. -Bronchodilators and as needed Mucinex   History of pulmonary Mycobacterium avium infection (HCC):  -Seen to be stable by CT scan today   Chronic diastolic CHF: 2D echo on 05/12/2023 showed EF of 55 to 60% with grade 2 diastolic dysfunction.  Patient has elevated BNP 855, but no leg edema.  Due to sepsis, will hold off diuretics. -Watch volume status closely   Coronary artery disease and myocardial injury: Troponin minimally elevated 55.  Likely due to demand ischemia. -Aspirin, Lipitor   Essential hypertension -Coreg   Dyslipidemia -Lipitor   PAC (premature atrial contraction) and  Frequent PVCs -Mexiletine and Coreg   Chronic kidney disease, stage 3a (HCC): Renal function stable. -Follow-up with BMP   Thrombocytopenia (HCC): Platelet 112, this is chronic issue. -Follow-up with CBC   Gallstone: Patient has a history of gallstone.  Post CT scan and right  upper quadrant ultrasound did not show complicating factors. -Observe closely   Acute metabolic encephalopathy: resolved.   Body mass index is 20.8 kg/m.  VTE ppx: enoxaparin (LOVENOX) injection  40 mg Start: 11/05/23 2215   Diet:     Diet   DIET DYS 2 Fluid consistency: Thin   Consultants: None   Subjective 11/06/23    Pt reports feeling improved. Denies SOB at rest. No nausea or chest pain   Objective   Vitals:   11/06/23 0530 11/06/23 0600 11/06/23 0630 11/06/23 0700  BP: (!) 120/48 (!) 124/48 (!) 112/48 (!) 134/54  Pulse: 68 60 63 63  Resp: 19 20 18  (!) 22  Temp:      TempSrc:      SpO2: 100% 100% 97% 98%  Weight:      Height:        Intake/Output Summary (Last 24 hours) at 11/06/2023 0981 Last data filed at 11/05/2023 2228 Gross per 24 hour  Intake 200 ml  Output --  Net 200 ml   Filed Weights   11/05/23 1451  Weight: 56.7 kg     Physical Exam:  General: awake, alert, NAD HEENT: atraumatic, clear conjunctiva, anicteric sclera, MMM, hearing grossly normal Respiratory: normal respiratory effort. Cardiovascular: quick capillary refill, normal S1/S2, RRR, no JVD, murmurs Gastrointestinal: soft, NT, ND Nervous: A&O x3. no gross focal neurologic deficits, normal speech Extremities: moves all equally, no edema, normal tone Skin: dry, intact, normal temperature, normal color. No rashes, lesions or ulcers on exposed skin Psychiatry: normal mood, congruent affect  Labs   I have personally reviewed the following labs and imaging studies CBC    Component Value Date/Time   WBC 3.2 (L) 11/06/2023 0408   RBC 3.99 11/06/2023 0408   HGB 13.2 11/06/2023 0408   HGB 13.9 07/07/2023 1603   HCT 40.4 11/06/2023 0408   HCT 42.3 07/07/2023 1603   PLT 100 (L) 11/06/2023 0408   PLT 121 (L) 06/09/2023 1600   MCV 101.3 (H) 11/06/2023 0408   MCV 97 06/09/2023 1600   MCH 33.1 11/06/2023 0408   MCHC 32.7 11/06/2023 0408   RDW 14.1 11/06/2023 0408   RDW 12.3 06/09/2023 1600   LYMPHSABS 1.2 07/15/2022 1418   MONOABS 0.5 07/15/2022 1418   EOSABS 0.0 07/15/2022 1418   BASOSABS 0.0 07/15/2022 1418      Latest Ref Rng & Units 11/06/2023    4:08 AM 11/05/2023    2:42  PM 11/01/2023    9:20 PM  BMP  Glucose 70 - 99 mg/dL 94  191  94   BUN 8 - 23 mg/dL 25  28  20    Creatinine 0.44 - 1.00 mg/dL 4.78  2.95  6.21   Sodium 135 - 145 mmol/L 143  143  141   Potassium 3.5 - 5.1 mmol/L 3.7  4.4  4.7   Chloride 98 - 111 mmol/L 105  105  104   CO2 22 - 32 mmol/L 28  26  26    Calcium 8.9 - 10.3 mg/dL 8.6  9.3  30.8     US ABDOMEN LIMITED RUQ (LIVER/GB) Result Date: 11/05/2023 CLINICAL DATA:  Fevers and abdominal pain EXAM: ULTRASOUND ABDOMEN LIMITED RIGHT UPPER QUADRANT COMPARISON:  CT from earlier in the same day. FINDINGS: Gallbladder: Gallbladder is well distended. Single gallstone is noted corresponding to that seen on prior CT. Common bile duct: Diameter: 1.6 mm. Liver: No focal lesion identified. Within normal limits in parenchymal echogenicity. Portal vein is patent on color  Doppler imaging with normal direction of blood flow towards the liver. Other: None. IMPRESSION: Cholelithiasis without complicating factors. Electronically Signed   By: Alcide Clever M.D.   On: 11/05/2023 22:09   CT CHEST ABDOMEN PELVIS W CONTRAST Result Date: 11/05/2023 CLINICAL DATA:  Lethargy and weakness, initial encounter EXAM: CT CHEST, ABDOMEN, AND PELVIS WITH CONTRAST TECHNIQUE: Multidetector CT imaging of the chest, abdomen and pelvis was performed following the standard protocol during bolus administration of intravenous contrast. RADIATION DOSE REDUCTION: This exam was performed according to the departmental dose-optimization program which includes automated exposure control, adjustment of the mA and/or kV according to patient size and/or use of iterative reconstruction technique. CONTRAST:  OMNIPAQUE IOHEXOL 300 MG/ML  SOLN COMPARISON:  Chest x-ray from 11/02/2023, CT from 07/15/2023 FINDINGS: CT CHEST FINDINGS Cardiovascular: Atherosclerotic calcifications of the thoracic aorta are noted. Changes consistent with prior coronary bypass grafting are noted. Heart is at the upper limits  of normal in size. No dissection is seen. Pulmonary artery shows a normal branching pattern without evidence of intraluminal filling defect to suggest pulmonary embolism. Timing was not performed for embolus evaluation however. Mediastinum/Nodes: Thoracic inlet is within normal limits. No hilar or mediastinal adenopathy is noted. The esophagus as visualized is within normal limits. Lungs/Pleura: Emphysematous changes are seen. No focal confluent infiltrate is noted. Apical scarring is noted bilaterally. Stable appearing cavitary lesion is noted in the right lower lobe superiorly measuring up to 2.8 cm. No other focal abnormality is noted. Musculoskeletal: Degenerative changes of the thoracic spine are noted. No acute rib fracture is noted. Sclerotic focus is noted in the left fourth rib anterior laterally stable from the prior exam. CT ABDOMEN PELVIS FINDINGS Hepatobiliary: Gallbladder is well distended. Single lamellated gallstone is noted near the gallbladder neck. No complicating factors are seen. Mild periportal lucency is noted which may represent some underlying hepatitis. Pancreas: Mild prominence of the pancreatic duct is noted. No focal mass is seen. Spleen: Spleen demonstrates scattered hypodensities which show delayed enhancement consistent with hemangiomas. Adrenals/Urinary Tract: Adrenal glands are within normal limits. Right kidney demonstrates a normal enhancement pattern. No renal calculi or obstructive changes are noted. Cortical thinning is noted on the left. No obstructive changes are seen. The bladder is partially distended. Stomach/Bowel: The appendix is not well visualized. No inflammatory changes to suggest appendicitis are seen. Small bowel is within normal limits. Stomach is decompressed. Scattered diverticular change of the colon is noted. Vascular/Lymphatic: Aortic atherosclerosis. No enlarged abdominal or pelvic lymph nodes. Reproductive: Status post hysterectomy. No adnexal masses.  Other: No abdominal wall hernia or abnormality. No abdominopelvic ascites. Musculoskeletal: Right hip prosthesis is noted. Degenerative changes of lumbar spine are noted. IMPRESSION: CT of the chest: Stable thick walled cavitary lesion in the right lower lobe consistent with indolent inflammatory change. No acute abnormality noted. CT of the abdomen and pelvis: Cholelithiasis without complicating factors. Periportal lucency suspicious for mild underlying hepatic inflammation. Diverticulosis without diverticulitis. Electronically Signed   By: Alcide Clever M.D.   On: 11/05/2023 20:50   CT HEAD WO CONTRAST ( ) Result Date: 11/05/2023 CLINICAL DATA:  Headache. EXAM: CT HEAD WITHOUT CONTRAST TECHNIQUE: Contiguous axial images were obtained from the base of the skull through the vertex without intravenous contrast. RADIATION DOSE REDUCTION: This exam was performed according to the departmental dose-optimization program which includes automated exposure control, adjustment of the mA and/or kV according to patient size and/or use of iterative reconstruction technique. COMPARISON:  None Available. FINDINGS: Brain: No evidence of acute  infarction, hemorrhage, hydrocephalus, extra-axial collection or mass lesion/mass effect. There is minimal patchy hypodensity in the left parietal periventricular white matter. Vascular: No hyperdense vessel or unexpected calcification. Skull: Normal. Negative for fracture or focal lesion. Sinuses/Orbits: No acute finding. Other: None. IMPRESSION: 1. No acute intracranial process. 2. Minimal patchy hypodensity in the left parietal periventricular white matter, nonspecific, likely chronic ischemic change. Electronically Signed   By: Darliss Cheney M.D.   On: 11/05/2023 20:49    Disposition Plan & Communication  Patient status: Inpatient  Admitted From: Home Planned disposition location: Home Anticipated discharge date: 2/1 pending clinical improvement  Family Communication: none at  bedside    Author: Leeroy Bock, DO [facility] 11/06/2023, 8:38 AM   Available by Epic secure chat 7AM-7PM. If 7PM-7AM, please contact night-coverage.  [facility] contact information found on [email or web address].

## 2023-11-06 NOTE — Progress Notes (Signed)
Pharmacy Antibiotic Note  Tiffany Caldwell is a 83 y.o. female admitted on 11/05/2023 with sepsis.  Pharmacy has been consulted for Vancomycin, Cefepime dosing.  Plan: Cefepime 2 gm IV X given in ED on 1/30 @ 2101. Cefepime 2 gm IV Q12H ordered to start on 1/31 @ 0900.  Vancomycin 1 gm IV X 1 given in ED on 1/30 @ 2228. Vancomycin 750 mg IV Q24H ordered to start on 1/31 @ 2200.  AUC = 505.9 Vanc trough = 13.7    Height: 5\' 5"  (165.1 cm) Weight: 56.7 kg (125 lb) IBW/kg (Calculated) : 57  Temp (24hrs), Avg:99.6 F (37.6 C), Min:98.5 F (36.9 C), Max:100.7 F (38.2 C)  Recent Labs  Lab 11/01/23 2120 11/05/23 1442 11/05/23 1936 11/05/23 2100  WBC 3.7* 3.6*  --   --   CREATININE 0.82 0.98  --   --   LATICACIDVEN  --   --  1.3 1.0    Estimated Creatinine Clearance: 38.3 mL/min (by C-G formula based on SCr of 0.98 mg/dL).    Allergies  Allergen Reactions   Amlodipine Swelling    Leg swelling and fatigue.   Hydromorphone Other (See Comments)    ALTERED MENTAL STATUS Altered mental status   Azithromycin Rash   Ciprofloxacin Nausea Only and Nausea And Vomiting   Hydromorphone Hcl Other (See Comments)    Altered mental status Altered mental status   Levofloxacin Nausea Only and Other (See Comments)   Lisinopril Other (See Comments)    Fatigue  Fatigue    Antimicrobials this admission:   >>    >>   Dose adjustments this admission:   Microbiology results:  BCx:   UCx:    Sputum:    MRSA PCR:   Thank you for allowing pharmacy to be a part of this patient's care.  Ayan Heffington D 11/06/2023 4:18 AM

## 2023-11-06 NOTE — ED Notes (Signed)
This RN was at bedside giving the patient her medications when she got choked up on the pills. Prior to administering the pills, this RN completed a full swallow screen which the patient passed. Will notify provider.

## 2023-11-07 DIAGNOSIS — I25119 Atherosclerotic heart disease of native coronary artery with unspecified angina pectoris: Secondary | ICD-10-CM | POA: Diagnosis not present

## 2023-11-07 DIAGNOSIS — J9621 Acute and chronic respiratory failure with hypoxia: Secondary | ICD-10-CM

## 2023-11-07 DIAGNOSIS — J449 Chronic obstructive pulmonary disease, unspecified: Secondary | ICD-10-CM | POA: Diagnosis not present

## 2023-11-07 DIAGNOSIS — I5033 Acute on chronic diastolic (congestive) heart failure: Secondary | ICD-10-CM | POA: Diagnosis not present

## 2023-11-07 LAB — CBC
HCT: 39.5 % (ref 36.0–46.0)
Hemoglobin: 12.6 g/dL (ref 12.0–15.0)
MCH: 32.8 pg (ref 26.0–34.0)
MCHC: 31.9 g/dL (ref 30.0–36.0)
MCV: 102.9 fL — ABNORMAL HIGH (ref 80.0–100.0)
Platelets: 92 10*3/uL — ABNORMAL LOW (ref 150–400)
RBC: 3.84 MIL/uL — ABNORMAL LOW (ref 3.87–5.11)
RDW: 13.8 % (ref 11.5–15.5)
WBC: 2.8 10*3/uL — ABNORMAL LOW (ref 4.0–10.5)
nRBC: 0 % (ref 0.0–0.2)

## 2023-11-07 LAB — CREATININE, SERUM
Creatinine, Ser: 0.75 mg/dL (ref 0.44–1.00)
GFR, Estimated: >60 mL/min (ref >60)

## 2023-11-07 MED ORDER — OMEGA-3-ACID ETHYL ESTERS 1 G PO CAPS
1.0000 g | ORAL_CAPSULE | Freq: Every day | ORAL | Status: DC
  Administered 2023-11-07 – 2023-11-08 (×2): 1 g via ORAL
  Filled 2023-11-07 (×2): qty 1

## 2023-11-07 MED ORDER — GLUCOSAMINE CHONDR 1500 COMPLX PO CAPS: ORAL_CAPSULE | Freq: Once | ORAL | Status: DC

## 2023-11-07 MED ORDER — ADULT MULTIVITAMIN W/MINERALS CH
1.0000 | ORAL_TABLET | Freq: Every day | ORAL | Status: DC
  Administered 2023-11-07 – 2023-11-08 (×2): 1 via ORAL
  Filled 2023-11-07 (×2): qty 1

## 2023-11-07 MED ORDER — FUROSEMIDE 10 MG/ML IJ SOLN
40.0000 mg | Freq: Once | INTRAMUSCULAR | Status: AC
  Administered 2023-11-07: 40 mg via INTRAVENOUS
  Filled 2023-11-07: qty 4

## 2023-11-07 MED ORDER — LORATADINE 10 MG PO TABS
10.0000 mg | ORAL_TABLET | Freq: Every day | ORAL | Status: DC
  Administered 2023-11-07 – 2023-11-08 (×2): 10 mg via ORAL
  Filled 2023-11-07 (×2): qty 1

## 2023-11-07 NOTE — Plan of Care (Signed)

## 2023-11-07 NOTE — Plan of Care (Signed)

## 2023-11-07 NOTE — Progress Notes (Signed)
Mobility Specialist - Progress Note   11/07/23 1443  Mobility  Activity Ambulated with assistance in hallway;Stood at bedside;Dangled on edge of bed  Level of Assistance Standby assist, set-up cues, supervision of patient - no hands on  Assistive Device Front wheel walker  Distance Ambulated (ft) 50 ft  Activity Response Tolerated well  Mobility Referral Yes  Mobility visit 1 Mobility  Mobility Specialist Start Time (ACUTE ONLY) 1421  Mobility Specialist Stop Time (ACUTE ONLY) 1442  Mobility Specialist Time Calculation (min) (ACUTE ONLY) 21 min    Pt supine in bed on 3L upon arrival. Pt able to complete bed mobility, STS, and ambulate SBA. Pt left in bed on 3L with needs in reach, bed alarm activated, and family in room.   MD requested Mobility Specialist to perform oxygen saturation test with pt which includes removing pt from oxygen both at rest and while ambulating.  Below are the results from that testing.      Patient Saturations on Room Air at Rest = spO2 93%   Patient Saturations on Room Air while Ambulating in hallway = sp02 86% .  Standing rest break and performed pursed lip breathing for 1 minute with sp02 at 88%.   Patient Saturations on 4 Liters of oxygen while Ambulating = sp02 95%   At end of testing pt left in room on 3 Liters of oxygen.   Reported results to MD.   Terrilyn Saver  Mobility Specialist  11/07/23 2:49 PM

## 2023-11-07 NOTE — Progress Notes (Signed)
[facility]  - [place] [facility]   PATIENT NAME: Tiffany Caldwell    MR#:  [id number]  DATE OF BIRTH:  06/07/1941  SUBJECTIVE:  no family at bedside. Patient overall feeling better. Still short winded. Does not wear oxygen at home. Has some ankle swelling. No fever. So far fever workup negative.    VITALS:  Blood pressure (!) 138/55, pulse 60, temperature 98.5 F (36.9 C), resp. rate 17, height 5\' 5"  (1.651 m), weight 57.2 kg, SpO2 92%.  PHYSICAL EXAMINATION:   GENERAL:  83 y.o.-year-old patient with no acute distress.  LUNGS: decreased breath sounds bilaterally, no wheezing. By basilar rales CARDIOVASCULAR: S1, S2 normal. No murmur   ABDOMEN: Soft, nontender, nondistended. Bowel sounds present.  EXTREMITIES: +  edema b/l.    NEUROLOGIC: nonfocal  patient is alert and awake SKIN: No obvious rash, lesion, or ulcer.   LABORATORY PANEL:  CBC Recent Labs  Lab 11/07/23 0520  WBC 2.8*  HGB 12.6  HCT 39.5  PLT 92*    Chemistries  Recent Labs  Lab 11/05/23 1442 11/06/23 0408 11/07/23 0520  NA 143 143  --   K 4.4 3.7  --   CL 105 105  --   CO2 26 28  --   GLUCOSE 125* 94  --   BUN 28* 25*  --   CREATININE 0.98 0.81 0.75  CALCIUM 9.3 8.6*  --   AST 58*  --   --   ALT 53*  --   --   ALKPHOS 86  --   --   BILITOT 1.2  --   --    Cardiac Enzymes No results for input(s): "TROPONINI" in the last 168 hours. RADIOLOGY:  US ABDOMEN LIMITED RUQ (LIVER/GB) Result Date: 11/05/2023 CLINICAL DATA:  Fevers and abdominal pain EXAM: ULTRASOUND ABDOMEN LIMITED RIGHT UPPER QUADRANT COMPARISON:  CT from earlier in the same day. FINDINGS: Gallbladder: Gallbladder is well distended. Single gallstone is noted corresponding to that seen on prior CT. Common bile duct: Diameter: 1.6 mm. Liver: No focal lesion identified. Within normal limits in parenchymal echogenicity. Portal vein is patent on color Doppler imaging with normal direction of blood flow towards the liver.  Other: None. IMPRESSION: Cholelithiasis without complicating factors. Electronically Signed   By: Alcide Clever M.D.   On: 11/05/2023 22:09   CT CHEST ABDOMEN PELVIS W CONTRAST Result Date: 11/05/2023 CLINICAL DATA:  Lethargy and weakness, initial encounter EXAM: CT CHEST, ABDOMEN, AND PELVIS WITH CONTRAST TECHNIQUE: Multidetector CT imaging of the chest, abdomen and pelvis was performed following the standard protocol during bolus administration of intravenous contrast. RADIATION DOSE REDUCTION: This exam was performed according to the departmental dose-optimization program which includes automated exposure control, adjustment of the mA and/or kV according to patient size and/or use of iterative reconstruction technique. CONTRAST:  OMNIPAQUE IOHEXOL 300 MG/ML  SOLN COMPARISON:  Chest x-ray from 11/02/2023, CT from 07/15/2023 FINDINGS: CT CHEST FINDINGS Cardiovascular: Atherosclerotic calcifications of the thoracic aorta are noted. Changes consistent with prior coronary bypass grafting are noted. Heart is at the upper limits of normal in size. No dissection is seen. Pulmonary artery shows a normal branching pattern without evidence of intraluminal filling defect to suggest pulmonary embolism. Timing was not performed for embolus evaluation however. Mediastinum/Nodes: Thoracic inlet is within normal limits. No hilar or mediastinal adenopathy is noted. The esophagus as visualized is within normal limits. Lungs/Pleura: Emphysematous changes are seen. No focal confluent infiltrate is noted. Apical scarring is noted bilaterally.  Stable appearing cavitary lesion is noted in the right lower lobe superiorly measuring up to 2.8 cm. No other focal abnormality is noted. Musculoskeletal: Degenerative changes of the thoracic spine are noted. No acute rib fracture is noted. Sclerotic focus is noted in the left fourth rib anterior laterally stable from the prior exam. CT ABDOMEN PELVIS FINDINGS Hepatobiliary: Gallbladder  is well distended. Single lamellated gallstone is noted near the gallbladder neck. No complicating factors are seen. Mild periportal lucency is noted which may represent some underlying hepatitis. Pancreas: Mild prominence of the pancreatic duct is noted. No focal mass is seen. Spleen: Spleen demonstrates scattered hypodensities which show delayed enhancement consistent with hemangiomas. Adrenals/Urinary Tract: Adrenal glands are within normal limits. Right kidney demonstrates a normal enhancement pattern. No renal calculi or obstructive changes are noted. Cortical thinning is noted on the left. No obstructive changes are seen. The bladder is partially distended. Stomach/Bowel: The appendix is not well visualized. No inflammatory changes to suggest appendicitis are seen. Small bowel is within normal limits. Stomach is decompressed. Scattered diverticular change of the colon is noted. Vascular/Lymphatic: Aortic atherosclerosis. No enlarged abdominal or pelvic lymph nodes. Reproductive: Status post hysterectomy. No adnexal masses. Other: No abdominal wall hernia or abnormality. No abdominopelvic ascites. Musculoskeletal: Right hip prosthesis is noted. Degenerative changes of lumbar spine are noted. IMPRESSION: CT of the chest: Stable thick walled cavitary lesion in the right lower lobe consistent with indolent inflammatory change. No acute abnormality noted. CT of the abdomen and pelvis: Cholelithiasis without complicating factors. Periportal lucency suspicious for mild underlying hepatic inflammation. Diverticulosis without diverticulitis. Electronically Signed   By: Alcide Clever M.D.   On: 11/05/2023 20:50   CT HEAD WO CONTRAST ( ) Result Date: 11/05/2023 CLINICAL DATA:  Headache. EXAM: CT HEAD WITHOUT CONTRAST TECHNIQUE: Contiguous axial images were obtained from the base of the skull through the vertex without intravenous contrast. RADIATION DOSE REDUCTION: This exam was performed according to the  departmental dose-optimization program which includes automated exposure control, adjustment of the mA and/or kV according to patient size and/or use of iterative reconstruction technique. COMPARISON:  None Available. FINDINGS: Brain: No evidence of acute infarction, hemorrhage, hydrocephalus, extra-axial collection or mass lesion/mass effect. There is minimal patchy hypodensity in the left parietal periventricular white matter. Vascular: No hyperdense vessel or unexpected calcification. Skull: Normal. Negative for fracture or focal lesion. Sinuses/Orbits: No acute finding. Other: None. IMPRESSION: 1. No acute intracranial process. 2. Minimal patchy hypodensity in the left parietal periventricular white matter, nonspecific, likely chronic ischemic change. Electronically Signed   By: Darliss Cheney M.D.   On: 11/05/2023 20:49    Assessment and Plan QUATISHA ZYLKA is a 83 y.o. female with medical history significant of hypertension, hyperlipidemia, COPD, CAD, diastolic CHF, CKD-3A, thrombocytopenia, pulmonary MAI, IBS, gallstone, PVC/PAC, breast cancer, who presents with confusion, nausea, vomiting, cough, shortness of breath. Was seen in ED on 11/01/2022, and started Levaquin for possible pneumonia, but no improvement.   oxygen saturation 86-90% on room air, which improved to 96% on 2 L oxygen.   CT-head: 1. No acute intracranial process. 2. Minimal patchy hypodensity in the left parietal periventricular white matter, nonspecific, likely chronic ischemia   CT-abdomen/pelvis/chest CT of the chest: Stable thick walled cavitary lesion in the right lower lobe consistent with indolent inflammatory change. No acute abnormality noted. CT of the abdomen and pelvis: Cholelithiasis without complicating factors. Periportal lucency suspicious for mild underlying hepatic inflammation. Diverticulosis without diverticulitis.   US-RUQ: Cholelithiasis without complicating factors.  Acute  hypoxic respiratory  failure secondary to CHF acute on chronic diastolic COPD chronic -- clinically does not have pneumonia. -- BNP 855 with trace lower extremity edema will give trial of IV Lasix -- assess for home oxygen -- PRN inhalers the nebulizer -oral steroid taper- --infectious disease workup negative -- 2D echo on 05/12/2023 showed EF of 55 to 60% with grade 2 diastolic   COPD (chronic obstructive pulmonary disease) (HCC), no wheezing. -Bronchodilators and as needed Mucinex   History of pulmonary Mycobacterium avium infection (HCC):  -Seen to be stable by CT scan today    Coronary artery disease and myocardial injury: Troponin minimally elevated 55.  Likely due to demand ischemia. -Aspirin, Lipitor   Essential hypertension -Coreg   Dyslipidemia -Lipitor   PAC (premature atrial contraction) and  Frequent PVCs -Mexiletine and Coreg   Chronic kidney disease, stage 3a (HCC):  --Renal function stable.  Thrombocytopenia (HCC): Platelet 112, this is chronic issue.   Gallstone: Patient has a history of gallstone.  Post CT scan and right upper quadrant ultrasound did not show complicating factors. -Observe closely   Family communication : none today Consults : none CODE STATUS: full DVT Prophylaxis :lovenox Level of care: Telemetry Medical Status is: Inpatient Remains inpatient appropriate because: iv lasix for mild chf    TOTAL TIME TAKING CARE OF THIS PATIENT: 35 minutes.  >50% time spent on counselling and coordination of care  Note: This dictation was prepared with Dragon dictation along with smaller phrase technology. Any transcriptional errors that result from this process are unintentional.  Enedina Finner M.D    [facility]   CC: Primary care physician; Delma Officer, [place]

## 2023-11-08 DIAGNOSIS — J9621 Acute and chronic respiratory failure with hypoxia: Secondary | ICD-10-CM | POA: Diagnosis not present

## 2023-11-08 DIAGNOSIS — J449 Chronic obstructive pulmonary disease, unspecified: Secondary | ICD-10-CM | POA: Diagnosis not present

## 2023-11-08 DIAGNOSIS — I10 Essential (primary) hypertension: Secondary | ICD-10-CM | POA: Diagnosis not present

## 2023-11-08 DIAGNOSIS — I5033 Acute on chronic diastolic (congestive) heart failure: Secondary | ICD-10-CM | POA: Diagnosis not present

## 2023-11-08 LAB — RESPIRATORY PANEL BY PCR

## 2023-11-08 MED ORDER — CARVEDILOL 3.125 MG PO TABS: 3.1250 mg | ORAL_TABLET | Freq: Two times a day (BID) | ORAL | Status: DC

## 2023-11-08 MED ORDER — ENSURE ENLIVE PO LIQD: 237.0000 mL | Freq: Two times a day (BID) | ORAL | 12 refills | Status: DC

## 2023-11-08 MED ORDER — POTASSIUM CHLORIDE CRYS ER 10 MEQ PO TBCR: 10.0000 meq | EXTENDED_RELEASE_TABLET | ORAL | 1 refills | Status: DC

## 2023-11-08 MED ORDER — FUROSEMIDE 20 MG PO TABS: 20.0000 mg | ORAL_TABLET | ORAL | 1 refills | Status: DC

## 2023-11-08 MED ORDER — FUROSEMIDE 20 MG PO TABS
20.0000 mg | ORAL_TABLET | Freq: Every day | ORAL | Status: DC
  Administered 2023-11-08: 20 mg via ORAL
  Filled 2023-11-08: qty 1

## 2023-11-08 NOTE — Discharge Instructions (Signed)
Use your oxygen as instructed. °

## 2023-11-08 NOTE — TOC Progression Note (Signed)
Transition of Care [facility]) - Progression Note    Patient Details  Name: Tiffany Caldwell MRN: [id number] Date of Birth: [birth year]  [facility] CM/SW Contact  Maree Krabbe, LCSW Phone Number: 11/08/2023, 10:40 AM  Clinical Narrative:   02 ordered through [employer].         Expected Discharge Plan and Services         Expected Discharge Date: 11/08/23                                     Social Determinants of Health (SDOH) Interventions SDOH Screenings   Food Insecurity: No Food Insecurity (11/06/2023)  Housing: Low Risk  (11/06/2023)  Transportation Needs: No Transportation Needs (11/06/2023)  Utilities: Not At Risk (11/07/2023)  Social Connections: Moderately Integrated (11/07/2023)  Tobacco Use: Medium Risk (11/05/2023)    Readmission Risk Interventions     No data to display

## 2023-11-08 NOTE — Plan of Care (Signed)

## 2023-11-08 NOTE — Discharge Summary (Addendum)
Physician Discharge Summary   Patient: Tiffany Caldwell MRN: [id number] DOB: 09-25-1941  Admit date:     11/05/2023  Discharge date: 11/08/23  Discharge Physician: Enedina Finner   PCP: Delma Officer, PA   Recommendations at discharge:    Use your oxygen as instrutcted F/u Dr End [facility] cardiology in 1-2 weeks for chf F/u PCP In 1-2 weeks  Discharge Diagnoses: Principal Problem:   SIRS (systemic inflammatory response syndrome) (HCC) Active Problems:   COPD (chronic obstructive pulmonary disease) (HCC)   Pulmonary Mycobacterium avium infection (HCC)   Coronary artery disease   Myocardial injury   Essential hypertension   Dyslipidemia   PAC (premature atrial contraction)   Frequent PVCs   Acute on chronic diastolic CHF (congestive heart failure) (HCC)   Chronic kidney disease, stage 3a (HCC)   Thrombocytopenia (HCC)   Gallstone   Acute metabolic encephalopathy   Acute on chronic respiratory failure with hypoxia (HCC)  Tiffany Caldwell is a 83 y.o. female with medical history significant of hypertension, hyperlipidemia, COPD, CAD, diastolic CHF, CKD-3A, thrombocytopenia, pulmonary MAI, IBS, gallstone, PVC/PAC, breast cancer, who presents with confusion, nausea, vomiting, cough, shortness of breath. Was seen in ED on 11/01/2022, and started Levaquin for possible pneumonia, but no improvement.    oxygen saturation 86-90% on room air, which improved to 96% on 2 L oxygen.    CT-head: 1. No acute intracranial process. 2. Minimal patchy hypodensity in the left parietal periventricular white matter, nonspecific, likely chronic ischemia   CT-abdomen/pelvis/chest CT of the chest: Stable thick walled cavitary lesion in the right lower lobe consistent with indolent inflammatory change. No acute abnormality noted. CT of the abdomen and pelvis: Cholelithiasis without complicating factors. Periportal lucency suspicious for mild underlying hepatic inflammation. Diverticulosis  without diverticulitis.   US-RUQ: Cholelithiasis without complicating factors.   Acute hypoxic respiratory failure secondary to CHF acute on chronic diastolic COPD chronic -- clinically does not have pneumonia. -- BNP 855 with trace lower extremity edema will give trial of IV Lasix 40 mg x1 -- assess for home oxygen--pt qualified for home oxygen -- PRN inhalers the nebulizer ---infectious disease workup negative -- 2D echo on 05/12/2023 showed EF of 55 to 60% with grade 2 diastolic  --Patient Saturations on Room Air at Rest = spO2 93%  Patient Saturations on Room Air while Ambulating in hallway = sp02 86% .  Standing rest break and performed pursed lip breathing for 1 minute with sp02 at 88%.  Patient Saturations on 4 Liters of oxygen while Ambulating = sp02 95% --pt sats 93% on 3 liters--will set up oxygen for home  --today feels a lot better. Breathing improved. D/w pt to take lasix 20 mg + kcl 10 meq every other day and f/u dr End  COPD (chronic obstructive pulmonary disease) (HCC), no wheezing. -Bronchodilators and as needed Mucinex   History of pulmonary Mycobacterium avium infection (HCC):  -Seen to be stable by CT scan today    Coronary artery disease and myocardial injury: Troponin minimally elevated 55.  Likely due to demand ischemia. -Aspirin, Lipitor   Essential hypertension -Coreg   Dyslipidemia -Lipitor   PAC (premature atrial contraction) and  Frequent PVCs -Mexiletine and Coreg   Chronic kidney disease, stage 3a (HCC):  --Renal function stable.   Thrombocytopenia (HCC): Platelet 112, this is chronic issue.   Gallstone: Patient has a history of gallstone.  -- right upper quadrant ultrasound did not show complicating factors.  Improving. D/c plans d/w pt. Discussed use of  oxygen at home for CHF/COPD   Family communication : none today Consults : none CODE STATUS: full DVT Prophylaxis :lovenox    Disposition: Home Diet recommendation:  Discharge Diet  Orders (From admission, onward)     Start     Ordered   11/08/23 0000  Diet - low sodium heart healthy        11/08/23 1021           Cardiac diet DISCHARGE MEDICATION: Allergies as of 11/08/2023       Reactions   Amlodipine Swelling   Leg swelling and fatigue.   Hydromorphone Other (See Comments)   ALTERED MENTAL STATUS Altered mental status   Azithromycin Rash   Ciprofloxacin Nausea Only, Nausea And Vomiting   Hydromorphone Hcl Other (See Comments)   Altered mental status Altered mental status   Levofloxacin Nausea Only, Other (See Comments)   Lisinopril Other (See Comments)   Fatigue Fatigue        Medication List     TAKE these medications    acetaminophen 500 MG tablet Commonly known as: TYLENOL Take 1,000 mg by mouth 2 (two) times daily.   albuterol 108 (90 Base) MCG/ACT inhaler Commonly known as: VENTOLIN HFA INHALE 1-2 PUFFS BY MOUTH EVERY 6 HOURS AS NEEDED FOR WHEEZING OR SHORTNESS OF BREATH What changed: See the new instructions.   Anoro Ellipta 62.5-25 MCG/ACT Aepb Generic drug: umeclidinium-vilanterol INHALE 1 PUFF INTO THE LUNGS BY MOUTH ONCE DAILY   aspirin EC 81 MG tablet Take 81 mg by mouth daily.   atorvastatin 20 MG tablet Commonly known as: LIPITOR Take 1 tablet (20 mg total) by mouth daily.   Biotin 91478 MCG Tabs Take 1 tablet by mouth daily.   carvedilol 3.125 MG tablet Commonly known as: COREG Take 3.125 mg by mouth 2 (two) times daily with a meal.   feeding supplement Liqd Take 237 mLs by mouth 2 (two) times daily between meals.   fexofenadine 180 MG tablet Commonly known as: ALLEGRA Take 180 mg by mouth daily.   fish oil-omega-3 fatty acids 1000 MG capsule Take 1 g by mouth daily.   furosemide 20 MG tablet Commonly known as: Lasix Take 1 tablet (20 mg total) by mouth every other day. Take 1 tablet 3 times a week (Monday, Wednesday and Friday) as needed. Start taking on: November 09, 2023 What changed: when to take  this   gabapentin 300 MG capsule Commonly known as: NEURONTIN Take 300 mg by mouth 3 (three) times daily.   GLUCOSAMINE CHONDR 1500 COMPLX PO Take 1 tablet by mouth once.   mexiletine 150 MG capsule Commonly known as: MEXITIL Take 1 capsule (150 mg total) by mouth 2 (two) times daily.   multivitamin tablet Take 1 tablet by mouth daily.   nitroGLYCERIN 0.4 MG SL tablet Commonly known as: NITROSTAT Place 0.4 mg under the tongue every 5 (five) minutes as needed for chest pain.   potassium chloride 10 MEQ tablet Commonly known as: KLOR-CON M Take 1 tablet (10 mEq total) by mouth every other day. Start taking on: November 09, 2023               Durable Medical Equipment  (From admission, onward)           Start     Ordered   11/08/23 1020  For home use only DME oxygen  Once       Question Answer Comment  Length of Need Lifetime   Mode or (Route) Nasal  cannula   Liters per Minute 2   Frequency Continuous (stationary and portable oxygen unit needed)   Oxygen conserving device Yes   Oxygen delivery system Gas      11/08/23 1019            Follow-up Information     End, Cristal Deer, MD. Schedule an appointment as soon as possible for a visit in 1 week(s).   Specialty: Cardiology Why: CHF f/u Contact information: [address] [place] [address] [phone]         Eliane Decree [place], [place]. Schedule an appointment as soon as possible for a visit in 1 week(s).   Specialty: Internal Medicine Contact information: [address] [place] [address] [phone]                Discharge Exam: Ceasar Mons Weights   11/05/23 1451 11/06/23 1731  Weight: 56.7 kg 57.2 kg   GENERAL:  83 y.o.-year-old patient with no acute distress.  LUNGS: decreased breath sounds bilaterally, no wheezing. By basilar rales CARDIOVASCULAR: S1, S2 normal. No murmur   ABDOMEN: Soft, nontender, nondistended. Bowel sounds present.   EXTREMITIES: minmal edema b/l.    NEUROLOGIC: nonfocal  patient is alert and awake  Condition at discharge: fair  The results of significant diagnostics from this hospitalization (including imaging, microbiology, ancillary and laboratory) are listed below for reference.   Imaging Studies: US ABDOMEN LIMITED RUQ (LIVER/GB) Result Date: 11/05/2023 CLINICAL DATA:  Fevers and abdominal pain EXAM: ULTRASOUND ABDOMEN LIMITED RIGHT UPPER QUADRANT COMPARISON:  CT from earlier in the same day. FINDINGS: Gallbladder: Gallbladder is well distended. Single gallstone is noted corresponding to that seen on prior CT. Common bile duct: Diameter: 1.6 mm. Liver: No focal lesion identified. Within normal limits in parenchymal echogenicity. Portal vein is patent on color Doppler imaging with normal direction of blood flow towards the liver. Other: None. IMPRESSION: Cholelithiasis without complicating factors. Electronically Signed   By: Alcide Clever M.D.   On: 11/05/2023 22:09   CT CHEST ABDOMEN PELVIS W CONTRAST Result Date: 11/05/2023 CLINICAL DATA:  Lethargy and weakness, initial encounter EXAM: CT CHEST, ABDOMEN, AND PELVIS WITH CONTRAST TECHNIQUE: Multidetector CT imaging of the chest, abdomen and pelvis was performed following the standard protocol during bolus administration of intravenous contrast. RADIATION DOSE REDUCTION: This exam was performed according to the departmental dose-optimization program which includes automated exposure control, adjustment of the mA and/or kV according to patient size and/or use of iterative reconstruction technique. CONTRAST:  OMNIPAQUE IOHEXOL 300 MG/ML  SOLN COMPARISON:  Chest x-ray from 11/02/2023, CT from 07/15/2023 FINDINGS: CT CHEST FINDINGS Cardiovascular: Atherosclerotic calcifications of the thoracic aorta are noted. Changes consistent with prior coronary bypass grafting are noted. Heart is at the upper limits of normal in size. No dissection is seen. Pulmonary  artery shows a normal branching pattern without evidence of intraluminal filling defect to suggest pulmonary embolism. Timing was not performed for embolus evaluation however. Mediastinum/Nodes: Thoracic inlet is within normal limits. No hilar or mediastinal adenopathy is noted. The esophagus as visualized is within normal limits. Lungs/Pleura: Emphysematous changes are seen. No focal confluent infiltrate is noted. Apical scarring is noted bilaterally. Stable appearing cavitary lesion is noted in the right lower lobe superiorly measuring up to 2.8 cm. No other focal abnormality is noted. Musculoskeletal: Degenerative changes of the thoracic spine are noted. No acute rib fracture is noted. Sclerotic focus is noted in the left fourth rib anterior laterally stable from the  prior exam. CT ABDOMEN PELVIS FINDINGS Hepatobiliary: Gallbladder is well distended. Single lamellated gallstone is noted near the gallbladder neck. No complicating factors are seen. Mild periportal lucency is noted which may represent some underlying hepatitis. Pancreas: Mild prominence of the pancreatic duct is noted. No focal mass is seen. Spleen: Spleen demonstrates scattered hypodensities which show delayed enhancement consistent with hemangiomas. Adrenals/Urinary Tract: Adrenal glands are within normal limits. Right kidney demonstrates a normal enhancement pattern. No renal calculi or obstructive changes are noted. Cortical thinning is noted on the left. No obstructive changes are seen. The bladder is partially distended. Stomach/Bowel: The appendix is not well visualized. No inflammatory changes to suggest appendicitis are seen. Small bowel is within normal limits. Stomach is decompressed. Scattered diverticular change of the colon is noted. Vascular/Lymphatic: Aortic atherosclerosis. No enlarged abdominal or pelvic lymph nodes. Reproductive: Status post hysterectomy. No adnexal masses. Other: No abdominal wall hernia or abnormality. No  abdominopelvic ascites. Musculoskeletal: Right hip prosthesis is noted. Degenerative changes of lumbar spine are noted. IMPRESSION: CT of the chest: Stable thick walled cavitary lesion in the right lower lobe consistent with indolent inflammatory change. No acute abnormality noted. CT of the abdomen and pelvis: Cholelithiasis without complicating factors. Periportal lucency suspicious for mild underlying hepatic inflammation. Diverticulosis without diverticulitis. Electronically Signed   By: Alcide Clever M.D.   On: 11/05/2023 20:50   CT HEAD WO CONTRAST ( ) Result Date: 11/05/2023 CLINICAL DATA:  Headache. EXAM: CT HEAD WITHOUT CONTRAST TECHNIQUE: Contiguous axial images were obtained from the base of the skull through the vertex without intravenous contrast. RADIATION DOSE REDUCTION: This exam was performed according to the departmental dose-optimization program which includes automated exposure control, adjustment of the mA and/or kV according to patient size and/or use of iterative reconstruction technique. COMPARISON:  None Available. FINDINGS: Brain: No evidence of acute infarction, hemorrhage, hydrocephalus, extra-axial collection or mass lesion/mass effect. There is minimal patchy hypodensity in the left parietal periventricular white matter. Vascular: No hyperdense vessel or unexpected calcification. Skull: Normal. Negative for fracture or focal lesion. Sinuses/Orbits: No acute finding. Other: None. IMPRESSION: 1. No acute intracranial process. 2. Minimal patchy hypodensity in the left parietal periventricular white matter, nonspecific, likely chronic ischemic change. Electronically Signed   By: Darliss Cheney M.D.   On: 11/05/2023 20:49   DG Chest 2 View Result Date: 11/02/2023 CLINICAL DATA:  Hypertension EXAM: CHEST - 2 VIEW COMPARISON:  Chest x-ray 09/30/2023 FINDINGS: Heart is enlarged. Patient is status post cardiac surgery. Left axillary surgical clips are present. There are patchy airspace  opacities in the right middle lobe. There is no pleural effusion or pneumothorax. No acute fractures are seen. IMPRESSION: Patchy airspace opacities in the right middle lobe, concerning for pneumonia. Follow-up chest x-ray recommended in 3-4 weeks to ensure resolution. Electronically Signed   By: Darliss Cheney M.D.   On: 11/02/2023 03:08    Microbiology: Results for orders placed or performed during the hospital encounter of 11/05/23  Resp panel by RT-PCR (RSV, Flu A&B, Covid) Anterior Nasal Swab     Status: None   Collection Time: 11/05/23  2:55 PM   Specimen: Anterior Nasal Swab  Result Value Ref Range Status   SARS Coronavirus 2 by RT PCR NEGATIVE NEGATIVE Final    Comment: (NOTE) SARS-CoV-2 target nucleic acids are NOT DETECTED.  The SARS-CoV-2 RNA is generally detectable in upper respiratory specimens during the acute phase of infection. The lowest concentration of SARS-CoV-2 viral copies this assay can detect is 138 copies/mL. A  negative result does not preclude SARS-Cov-2 infection and should not be used as the sole basis for treatment or other patient management decisions. A negative result may occur with  improper specimen collection/handling, submission of specimen other than nasopharyngeal swab, presence of viral mutation(s) within the areas targeted by this assay, and inadequate number of viral copies(<138 copies/mL). A negative result must be combined with clinical observations, patient history, and epidemiological information. The expected result is Negative.  Fact Sheet for Patients:  BloggerCourse.com  Fact Sheet for Healthcare Providers:  SeriousBroker.it  This test is no t yet approved or cleared by the [place] FDA and  has been authorized for detection and/or diagnosis of SARS-CoV-2 by FDA under an Emergency Use Authorization (EUA). This EUA will remain  in effect (meaning this test can be used) for the  duration of the COVID-19 declaration under Section 564(b)(1) of the Act, 21 U.S.C.section 360bbb-3(b)(1), unless the authorization is terminated  or revoked sooner.       Influenza A by PCR NEGATIVE NEGATIVE Final   Influenza B by PCR NEGATIVE NEGATIVE Final    Comment: (NOTE) The Xpert Xpress SARS-CoV-2/FLU/RSV plus assay is intended as an aid in the diagnosis of influenza from Nasopharyngeal swab specimens and should not be used as a sole basis for treatment. Nasal washings and aspirates are unacceptable for Xpert Xpress SARS-CoV-2/FLU/RSV testing.  Fact Sheet for Patients: BloggerCourse.com  Fact Sheet for Healthcare Providers: SeriousBroker.it  This test is not yet approved or cleared by the [place] FDA and has been authorized for detection and/or diagnosis of SARS-CoV-2 by FDA under an Emergency Use Authorization (EUA). This EUA will remain in effect (meaning this test can be used) for the duration of the COVID-19 declaration under Section 564(b)(1) of the Act, 21 U.S.C. section 360bbb-3(b)(1), unless the authorization is terminated or revoked.     Resp Syncytial Virus by PCR NEGATIVE NEGATIVE Final    Comment: (NOTE) Fact Sheet for Patients: BloggerCourse.com  Fact Sheet for Healthcare Providers: SeriousBroker.it  This test is not yet approved or cleared by the [place] FDA and has been authorized for detection and/or diagnosis of SARS-CoV-2 by FDA under an Emergency Use Authorization (EUA). This EUA will remain in effect (meaning this test can be used) for the duration of the COVID-19 declaration under Section 564(b)(1) of the Act, 21 U.S.C. section 360bbb-3(b)(1), unless the authorization is terminated or revoked.  Performed at [facility], [address] [place], [place] [address]   Blood culture (routine x 2)     Status: None (Preliminary  result)   Collection Time: 11/05/23  7:36 PM   Specimen: BLOOD LEFT ARM  Result Value Ref Range Status   Specimen Description BLOOD LEFT ARM  Final   Special Requests   Final    BOTTLES DRAWN AEROBIC AND ANAEROBIC Blood Culture adequate volume   Culture   Final    NO GROWTH 3 DAYS Performed at [facility], [address] [place], [place] [address]    Report Status PENDING  Incomplete  Blood culture (routine x 2)     Status: None (Preliminary result)   Collection Time: 11/05/23  7:37 PM   Specimen: BLOOD LEFT HAND  Result Value Ref Range Status   Specimen Description BLOOD LEFT HAND  Final   Special Requests   Final    BOTTLES DRAWN AEROBIC AND ANAEROBIC Blood Culture results may not be optimal due to an inadequate volume of blood received in culture bottles  Culture   Final    NO GROWTH 3 DAYS Performed at [facility], [address] [place], [place] [address]    Report Status PENDING  Incomplete  MRSA Next Gen by PCR, Nasal     Status: None   Collection Time: 11/06/23  9:27 AM   Specimen: Nasal Mucosa; Nasal Swab  Result Value Ref Range Status   MRSA by PCR Next Gen NOT DETECTED NOT DETECTED Final    Comment: (NOTE) The GeneXpert MRSA Assay (FDA approved for NASAL specimens only), is one component of a comprehensive MRSA colonization surveillance program. It is not intended to diagnose MRSA infection nor to guide or monitor treatment for MRSA infections. Test performance is not FDA approved in patients less than 87 years old. Performed at [facility], [address] [place], [place] [address]     Labs: CBC: Recent Labs  Lab 11/01/23 2120 11/05/23 1442 11/06/23 0408 11/07/23 0520  WBC 3.7* 3.6* 3.2* 2.8*  HGB 14.3 14.6 13.2 12.6  HCT 44.7 44.7 40.4 39.5  MCV 102.5* 99.8 101.3* 102.9*  PLT 128* 112* 100* 92*   Basic Metabolic Panel: Recent Labs  Lab 11/01/23 2120 11/05/23 1442 11/06/23 0408 11/07/23 0520  NA 141 143 143   --   K 4.7 4.4 3.7  --   CL 104 105 105  --   CO2 26 26 28   --   GLUCOSE 94 125* 94  --   BUN 20 28* 25*  --   CREATININE 0.82 0.98 0.81 0.75  CALCIUM 10.2 9.3 8.6*  --    Liver Function Tests: Recent Labs  Lab 11/05/23 1442  AST 58*  ALT 53*  ALKPHOS 86  BILITOT 1.2  PROT 6.6  ALBUMIN 3.6     Discharge time spent: greater than 30 minutes.  Signed: Enedina Finner, MD [facility] 11/08/2023

## 2023-11-09 ENCOUNTER — Other Ambulatory Visit: Payer: Self-pay

## 2023-11-09 DIAGNOSIS — I493 Ventricular premature depolarization: Secondary | ICD-10-CM

## 2023-11-10 LAB — CULTURE, BLOOD (ROUTINE X 2)
Culture: NO GROWTH
Culture: NO GROWTH
Special Requests: ADEQUATE

## 2023-11-17 NOTE — Progress Notes (Deleted)
 Cardiology Clinic Note   Date: 11/17/2023 ID: Tiffany Caldwell, Tiffany Caldwell 12/17/1940, MRN [id number]  [facility] Providers Cardiologist:  Yvonne Kendall, MD Electrophysiologist:  Nobie Putnam, MD   Chief Complaint   Tiffany Caldwell is a 83 y.o. female who presents to the clinic today for ***  Patient Profile   Tiffany Caldwell is followed by Dr. Okey Dupre and Dr. Jimmey Ralph for the history outlined below.      Past medical history significant for: CAD. LHC 12/16/1999 (abnormal stress test): Mid LAD 80 to 90%.  PCI with stent to LAD. LHC 05/28/2000 (abnormal stress test): Proximal RCA 30 to 40%.  Diffuse in-stent restenosis greatest degree 80 to 90% mid LAD.  PTCA and stent placement proximal LAD with good angiographic result.  Angiography and orthogonal views showed a proximal dissection of the LAD which was significantly obstructive.  An additional stent was deployed in the proximal portion of the LAD resulting in wide patency of the LAD however retrograde movement of the dissection into the distal portion of the left main.  The lesion was observed over approximately 30 minutes and the degree of stenosis increased from trivial to approximately 50%.  The patient remained hemodynamically stable and without any evidence of EKG changes or chest discomfort.  CT surgery was consulted and decision was made to proceed with semiurgent bypass due to the unpredictable nature of the lesion. CABG x 3 05/29/2000: LIMA to LAD, SVG to D1, SVG to OM1. R/LHC 06/17/2023 (DOE/pulmonary hypertension): Severe single-vessel CAD with CTO of proximal/mid LAD.  Widely patent LIMA to LAD.  Chronically occluded SVG to D1 and SVG to OM1.  60 to 78% ostial OM1.  50% mid LCx.  30% proximal RCA.  Recommend medical management. Chronic diastolic heart failure/pulmonary hypertension. Echo 05/12/2023: EF 55 to 60%.  No RWMA.  Severe asymmetric LVH of the basal-septal segment.  Grade II DD.  Low normal RV function, normal RV  size.  Moderately elevated PA pressure, RVSP 59.3 mmHg.  Severe LAE.  Mild to moderate RAE.  Mild to moderate MR.  Mild AI.  Aortic valve sclerosis without stenosis.  Dilated IVC, RA pressure 8 mmHg. RHC 06/17/2023: Normal left and right heart filling pressures.  Mild to moderate pulmonary hypertension.  Normal Fick cardiac output/index.  Recommendation for follow-up with pulmonology to manage chronic lung disease likely driving chronic DOE and mild to moderate pulmonary hypertension.  If symptoms persist would consider cardiac MRI to better evaluate severe asymmetric LVH. Frequent PVCs. 14-day ZIO 08/03/2023: HR 42 to 86 bpm, average 62 bpm.  3 episodes of NSVT lasting up to 9 beats with max rate 176 bpm.  41 SVT runs lasting up to 16 beats with max rate 164 bpm.  Occasional PACs 4.2% burden.  Frequent PVCs 22.7% burden. Hypertension. Hyperlipidemia. Lipid panel 06/01/2023: LDL 67, HDL 67, TG 53, total 145. COPD. GERD. CKD stage IIIa. Breast cancer. S/p left mastectomy 02/02/2004.  In summary, patient has a history of CAD s/p PCI with stent to LAD March 2001.  In August 2001 she developed anginal symptoms and had a positive stress test.  LHC showed diffuse in-stent restenosis 80 to 90% in the mid LAD.  She underwent PTCA and stent placement to the proximal LAD with subsequent dissection and urgent CABG as detailed above.  Lexiscan in 2020 showed no significant ischemia and was overall low risk.  Echo at that time showed EF 65 to 70%, Grade I DD, mild AI.  In 2022 she was  taken off of amlodipine secondary to lower extremity edema with improvement to symptoms.  In May 2024 she noted an outside office discontinued HCTZ secondary to calcium levels.  She was placed back on amlodipine and had recurrence of lower extremity edema.  Amlodipine was discontinued and she was started on carvedilol and telmisartan was continued.  In July 2024 she reported improvement in lower extremity edema and stable chronic dyspnea.   She was started on Lasix 3 days a week.  Echo August 2024 showed normal LV function and moderate pulmonary hypertension as detailed above.  Due to findings of echo she underwent R/LHC (see details above).  It was recommended patient follow-up with pulmonology to manage chronic lung disease likely driving pulmonary hypertension.  Upon follow-up in October 2024 patient complained of increased fatigue.  EKG demonstrated frequent PVCs.  2 weeks ZIO showed frequent PVCs with 22.7% burden as detailed above.  Patient was evaluated by Dr. Jimmey Ralph in EP on 10/13/2023 who started mexiletine and changed carvedilol to Toprol twice daily with plan to repeat ZIO in 1 month.  Patient was last seen in the office by Cadence Furth, PA-C on 10/23/2023.  She had been seen in the ED for elevated BP and fatigue with BP 178/106 upon arrival.  Troponin mildly elevated to 40.  No anginal symptoms reported.  Patient was given IV fluids and discharged home.  On follow-up she was feeling improved.  Her BP at home continued to be mildly elevated.  BP at the time of her visit was 130/72.  No medication changes were made.  Patient was evaluated in the ED on 11/01/2023 for elevated BP.  BP 187/87 upon arrival.  Troponin 24>> 26.  Labs otherwise unremarkable.  Chest x-ray showed patchy airspace opacities in the right middle lobe concerning for pneumonia.  She was discharged with p.o. antibiotics.  Patient contacted the office on 11/04/2023 to report PCP changed patient from Toprol back to carvedilol.  Patient presented to the ED via EMS on 11/05/2023 with lethargy and vomiting.  Upon arrival BP 180/83.  She was hypoxic at 86% and placed on 2 L.  Initial labs: Initial labs: Sodium 143, potassium 4.4, creatinine 0.98, BUN 28, eGFR 57, AST 58, ALT 53, WBC 3.6, hemoglobin 14.6, BNP 855.4.  Troponin 55.  Respiratory panel negative for COVID, RSV, flu.  CT head showed no acute intracranial process.  CT chest abdomen pelvis showed cholelithiasis without  complicating factors, periportal lucencies suspicious for mild underlying hepatic inflammation, diverticulosis without diverticulitis.  Patient was discharged on 11/08/2023.     History of Present Illness    Today, patient ***  CAD S/p PCI with stent to LAD March 2001.  August 2001 LHC showed and stent restenosis mid LAD.  Patient underwent PTCA and stent placement of proximal LAD with subsequent dissection and urgent CABG x 3.  [facility] September 2024 showed severe single-vessel CAD with CTO of proximal/mid LAD, widely patent LIMA to LAD, CTO of SVG to D1 and SVG to OM1 with recommendations to treat medically.  Patient*** -Continue aspirin, atorvastatin, carvedilol, as needed SL NTG.  Chronic diastolic heart failure/pulmonary hypertension Echo August 2024 showed EF 55 to 60%, low normal RV function/normal RV size, severe asymmetric LVH of the basal-septal segment, Grade II DD, moderately elevated PA pressure, RVSP 59.3 mmHg, BAE, mild to moderate MR, mild AI.  RHC September 2024 showed mild to moderate pulmonary hypertension with normal right heart and left heart filling pressures.  Patient*** Euvolemic and well compensated on exam. -  Continue carvedilol, Lasix. -Pending cardiac MRI scheduled for 12/16/2023.  Frequent PVCs 14-day ZIO October 2024 showed frequent PVCs with 22.7% burden, occasional PACs 4.2% burden.  Patient***EKG*** -Continue carvedilol and mexiletine.  Hypertension BP today*** -Continue carvedilol.  Hyperlipidemia LDL August 2024 67, at goal. -Continue atorvastatin.  ROS: All other systems reviewed and are otherwise negative except as noted in History of Present Illness.  EKGs/Labs Reviewed        11/05/2023: ALT 53; AST 58 11/06/2023: BUN 25; Potassium 3.7; Sodium 143 11/07/2023: Creatinine, Ser 0.75   11/07/2023: Hemoglobin 12.6; WBC 2.8   07/07/2023: TSH 2.730   11/05/2023: B Natriuretic Peptide 855.4  ***  Risk Assessment/Calculations    {Does this patient have  ATRIAL FIBRILLATION?[phone]} No BP recorded.  {Refresh Note OR Click here to enter BP  :1}***        Physical Exam    VS:  LMP  (LMP Unknown)  , BMI There is no height or weight on file to calculate BMI.  GEN: Well nourished, well developed, in no acute distress. Neck: No JVD or carotid bruits. Cardiac: *** RRR. No murmurs. No rubs or gallops.   Respiratory:  Respirations regular and unlabored. Clear to auscultation without rales, wheezing or rhonchi. GI: Soft, nontender, nondistended. Extremities: Radials/DP/PT 2+ and equal bilaterally. No clubbing or cyanosis. No edema ***  Skin: Warm and dry, no rash. Neuro: Strength intact.  Assessment & Plan   ***  Disposition: ***     {Are you ordering a CV Procedure (e.g. stress test, cath, DCCV, TEE, etc)?   Press F2        :[id number]}   Signed, Etta Grandchild. Younis Mathey, DNP, NP-C

## 2023-11-18 ENCOUNTER — Ambulatory Visit: Payer: PPO | Admitting: Student

## 2023-11-19 ENCOUNTER — Ambulatory Visit (HOSPITAL_COMMUNITY)
Admission: RE | Admit: 2023-11-19 | Discharge: 2023-11-19 | Disposition: A | Discharge status: Home / Self Care | Payer: PPO | Source: Ambulatory Visit | Attending: Cardiovascular Disease | Admitting: Cardiovascular Disease

## 2023-11-19 ENCOUNTER — Other Ambulatory Visit (HOSPITAL_COMMUNITY): Payer: Self-pay | Admitting: Physician Assistant

## 2023-11-19 DIAGNOSIS — M79602 Pain in left arm: Secondary | ICD-10-CM | POA: Diagnosis present

## 2023-11-28 NOTE — Progress Notes (Unsigned)
 Cardiology Clinic Note   Date: 12/01/2023 ID: Tiffany Caldwell, Tiffany Caldwell 05-Dec-1940, MRN [id number]  [facility] Providers Cardiologist:  Yvonne Kendall, MD Electrophysiologist:  Nobie Putnam, MD   Chief Complaint   Tiffany Caldwell is a 83 y.o. female who presents to the clinic today for hospital follow up.   Patient Profile   Tiffany Caldwell is followed by Dr. Okey Dupre and Dr. Jimmey Ralph for the history outlined below.      Past medical history significant for: CAD. LHC 12/16/1999 (abnormal stress test): Mid LAD 80 to 90%.  PCI with stent to LAD. LHC 05/28/2000 (abnormal stress test): Proximal RCA 30 to 40%.  Diffuse in-stent restenosis greatest degree 80 to 90% mid LAD.  PTCA and stent placement proximal LAD with good angiographic result.  Angiography and orthogonal views showed a proximal dissection of the LAD which was significantly obstructive.  An additional stent was deployed in the proximal portion of the LAD resulting in wide patency of the LAD however retrograde movement of the dissection into the distal portion of the left main.  The lesion was observed over approximately 30 minutes and the degree of stenosis increased from trivial to approximately 50%.  The patient remained hemodynamically stable and without any evidence of EKG changes or chest discomfort.  CT surgery was consulted and decision was made to proceed with semiurgent bypass due to the unpredictable nature of the lesion. CABG x 3 05/29/2000: LIMA to LAD, SVG to D1, SVG to OM1. R/LHC 06/17/2023 (DOE/pulmonary hypertension): Severe single-vessel CAD with CTO of proximal/mid LAD.  Widely patent LIMA to LAD.  Chronically occluded SVG to D1 and SVG to OM1.  60 to 78% ostial OM1.  50% mid LCx.  30% proximal RCA.  Recommend medical management. Chronic diastolic heart failure/pulmonary hypertension. Echo 05/12/2023: EF 55 to 60%.  No RWMA.  Severe asymmetric LVH of the basal-septal segment.  Grade II DD.  Low normal RV  function, normal RV size.  Moderately elevated PA pressure, RVSP 59.3 mmHg.  Severe LAE.  Mild to moderate RAE.  Mild to moderate MR.  Mild AI.  Aortic valve sclerosis without stenosis.  Dilated IVC, RA pressure 8 mmHg. RHC 06/17/2023: Normal left and right heart filling pressures.  Mild to moderate pulmonary hypertension.  Normal Fick cardiac output/index.  Recommendation for follow-up with pulmonology to manage chronic lung disease likely driving chronic DOE and mild to moderate pulmonary hypertension.  If symptoms persist would consider cardiac MRI to better evaluate severe asymmetric LVH. Frequent PVCs. 14-day ZIO 08/03/2023: HR 42 to 86 bpm, average 62 bpm.  3 episodes of NSVT lasting up to 9 beats with max rate 176 bpm.  41 SVT runs lasting up to 16 beats with max rate 164 bpm.  Occasional PACs 4.2% burden.  Frequent PVCs 22.7% burden. Hypertension. Hyperlipidemia. Lipid panel 06/01/2023: LDL 67, HDL 67, TG 53, total 145. COPD. GERD. CKD stage IIIa. Breast cancer. S/p left mastectomy 02/02/2004.  In summary, patient has a history of CAD s/p PCI with stent to LAD March 2001.  In August 2001 she developed anginal symptoms and had a positive stress test.  LHC showed diffuse in-stent restenosis 80 to 90% in the mid LAD.  She underwent PTCA and stent placement to the proximal LAD with subsequent dissection and urgent CABG as detailed above.  Lexiscan in 2020 showed no significant ischemia and was overall low risk.  Echo at that time showed EF 65 to 70%, Grade I DD, mild AI.  In  2022 she was taken off of amlodipine secondary to lower extremity edema with improvement to symptoms.  In May 2024 she noted an outside office discontinued HCTZ secondary to calcium levels.  She was placed back on amlodipine and had recurrence of lower extremity edema.  Amlodipine was discontinued and she was started on carvedilol and telmisartan was continued.  In July 2024 she reported improvement in lower extremity edema and  stable chronic dyspnea.  She was started on Lasix 3 days a week.  Echo August 2024 showed normal LV function and moderate pulmonary hypertension as detailed above.  Due to findings of echo she underwent R/LHC (see details above).  It was recommended patient follow-up with pulmonology to manage chronic lung disease likely driving pulmonary hypertension.  Upon follow-up in October 2024 patient complained of increased fatigue.  EKG demonstrated frequent PVCs.  2 weeks ZIO showed frequent PVCs with 22.7% burden as detailed above.  Patient was evaluated by Dr. Jimmey Ralph in EP on 10/13/2023 who started mexiletine and changed carvedilol to Toprol twice daily with plan to repeat ZIO in 1 month.  Patient was last seen in the office by Cadence Furth, PA-C on 10/23/2023.  She had been seen in the ED for elevated BP and fatigue with BP 178/106 upon arrival.  Troponin mildly elevated to 40.  No anginal symptoms reported.  Patient was given IV fluids and discharged home.  On follow-up she was feeling improved.  Her BP at home continued to be mildly elevated.  BP at the time of her visit was 130/72.  No medication changes were made.  Patient was evaluated in the ED on 11/01/2023 for elevated BP.  BP 187/87 upon arrival.  Troponin 24>> 26.  Labs otherwise unremarkable.  Chest x-ray showed patchy airspace opacities in the right middle lobe concerning for pneumonia.  She was discharged with p.o. antibiotics.  Patient contacted the office on 11/04/2023 to report PCP changed patient from Toprol back to carvedilol.  Patient presented to the ED via EMS on 11/05/2023 with lethargy and vomiting.  Upon arrival BP 180/83.  She was hypoxic at 86% and placed on 2 L.  Initial labs: Initial labs: Sodium 143, potassium 4.4, creatinine 0.98, BUN 28, eGFR 57, AST 58, ALT 53, WBC 3.6, hemoglobin 14.6, BNP 855.4.  Troponin 55.  Respiratory panel negative for COVID, RSV, flu.  CT head showed no acute intracranial process.  CT chest abdomen pelvis  showed cholelithiasis without complicating factors, periportal lucencies suspicious for mild underlying hepatic inflammation, diverticulosis without diverticulitis.  Patient was discharged on 11/08/2023.     History of Present Illness    Today, patient is here alone. Patient denies orthopnea or PND. She reports resolving lower extremity edema much improved from when she was in the hospital. She feels edema is well managed on Lasix 3 times a week. She has chronic dyspnea related to COPD that is stable and unchanged. No chest pain, pressure, or tightness. She has had palpitations for years. Discussed recent hospital course. EKG performed in ED is documented as afib.  Patient states this was not discussed with her. Reviewed EKG with Dr. Kirke Corin who feels despite poor quality of EKG it could be afib. It was agreed that patient should not be started on anticoagulation at this time. Discussed the role of anticoagulation with the patient. She is a former Engineer, civil (consulting) and verbalized understanding. She is in agreement with holding off on anticoagulation at this time.      ROS: All other systems reviewed and  are otherwise negative except as noted in History of Present Illness.  EKGs/Labs Reviewed    EKG Interpretation Date/Time:  Tuesday December 01 2023 15:31:50 EST Ventricular Rate:  63 PR Interval:  202 QRS Duration:  88 QT Interval:  416 QTC Calculation: 425 R Axis:   -45  Text Interpretation: Normal sinus rhythm Left anterior fascicular block Left ventricular hypertrophy ( R in aVL , Sokolow-Lyon , Cornell product ) Septal infarct , age undetermined ST & T wave abnormality, consider inferior ischemia ST & T wave abnormality, consider anterolateral ischemia When compared with ECG of 11/01/2023 No significant change Reconfirmed by Carlos Levering [phone]) on 12/01/2023 3:54:02 PM   11/05/2023: ALT 53; AST 58 11/06/2023: BUN 25; Potassium 3.7; Sodium 143 11/07/2023: Creatinine, Ser 0.75   11/07/2023: Hemoglobin  12.6; WBC 2.8   07/07/2023: TSH 2.730   11/05/2023: B Natriuretic Peptide 855.4       Physical Exam    VS:  BP 104/66   Pulse 63   Ht 5\' 3"  (1.6 m)   Wt 120 lb 3.2 oz (54.5 kg)   LMP  (LMP Unknown)   SpO2 94%   BMI 21.29 kg/m  , BMI Body mass index is 21.29 kg/m.  GEN: Well nourished, well developed, in no acute distress. Neck: No JVD or carotid bruits. Cardiac:  RRR. No murmurs. No rubs or gallops.   Respiratory:  Respirations regular and unlabored. Clear to auscultation without rales, wheezing or rhonchi. GI: Soft, nontender, nondistended. Extremities: Radials/DP/PT 2+ and equal bilaterally. No clubbing or cyanosis. Mild, nonpitting pretibial edema, 1+ pitting edema bilateral feet.   Skin: Warm and dry, no rash. Neuro: Strength intact.  Assessment & Plan   CAD S/p PCI with stent to LAD March 2001.  August 2001 LHC showed and stent restenosis mid LAD.  Patient underwent PTCA and stent placement of proximal LAD with subsequent dissection and urgent CABG x 3.  [facility] September 2024 showed severe single-vessel CAD with CTO of proximal/mid LAD, widely patent LIMA to LAD, CTO of SVG to D1 and SVG to OM1 with recommendations to treat medically.  Patient denies chest pain, pressure or tightness. She is very active around her home.  -Continue aspirin, atorvastatin, carvedilol, as needed SL NTG.  Chronic diastolic heart failure/pulmonary hypertension Echo August 2024 showed EF 55 to 60%, low normal RV function/normal RV size, severe asymmetric LVH of the basal-septal segment, Grade II DD, moderately elevated PA pressure, RVSP 59.3 mmHg, BAE, mild to moderate MR, mild AI.  RHC September 2024 showed mild to moderate pulmonary hypertension with normal right heart and left heart filling pressures.  Patient reports breathing is back to baseline since hospital discharge. She denies orthopnea or PND. She reports lower extremity edema has improved since hospital admission. She has mild pretibial  edema and 1+ edema bilateral feet otherwise she is euvolemic and well compensated on exam. -Continue carvedilol, Lasix. -Pending cardiac MRI scheduled for 12/16/2023.  Frequent PVCs 14-day ZIO October 2024 showed frequent PVCs with 22.7% burden, occasional PACs 4.2% burden.  Patient reports experiencing palpitations for years. Discussed EKG performed in the ED on 1/30 that was documented as afib. This questionably afib. It was reviewed by Sherie Don, NP and Dr. Kirke Corin who felt it could be afib. Given this was an isolated event and a poor quality EKG it was agreed that patient did not need to start anticoagulation. This was discussed at length with the patient and she agrees. EKG today shows sinus rhythm with no ectopy.  -  Continue carvedilol and mexiletine. -Keep follow up with Dr. Jimmey Ralph 01/12/2024.   Hypertension BP today 104/66. No report of headaches or dizziness.  -Continue carvedilol.  Hyperlipidemia LDL August 2024 67, at goal. -Continue atorvastatin.  Disposition: Keep scheduled MRI and follow up with Dr. Okey Dupre. Keep scheduled follow up with Dr. Jimmey Ralph. Return sooner as needed.          Signed, Etta Grandchild. Cregg Jutte, DNP, NP-C

## 2023-12-01 ENCOUNTER — Encounter: Payer: Self-pay | Admitting: Student

## 2023-12-01 ENCOUNTER — Ambulatory Visit: Payer: PPO | Attending: Student | Admitting: Student

## 2023-12-01 VITALS — BP 104/66 | HR 63 | Ht 63.0 in | Wt 120.2 lb

## 2023-12-01 DIAGNOSIS — I5032 Chronic diastolic (congestive) heart failure: Secondary | ICD-10-CM

## 2023-12-01 DIAGNOSIS — E785 Hyperlipidemia, unspecified: Secondary | ICD-10-CM

## 2023-12-01 DIAGNOSIS — I493 Ventricular premature depolarization: Secondary | ICD-10-CM

## 2023-12-01 DIAGNOSIS — R002 Palpitations: Secondary | ICD-10-CM

## 2023-12-01 DIAGNOSIS — I25810 Atherosclerosis of coronary artery bypass graft(s) without angina pectoris: Secondary | ICD-10-CM | POA: Diagnosis not present

## 2023-12-01 DIAGNOSIS — I272 Pulmonary hypertension, unspecified: Secondary | ICD-10-CM

## 2023-12-01 DIAGNOSIS — I10 Essential (primary) hypertension: Secondary | ICD-10-CM | POA: Diagnosis not present

## 2023-12-01 NOTE — Patient Instructions (Signed)
 Medication Instructions:  No changes at this time.   *If you need a refill on your cardiac medications before your next appointment, please call your pharmacy*   Lab Work: None  If you have labs (blood work) drawn today and your tests are completely normal, you will receive your results only by: MyChart Message (if you have MyChart) OR A paper copy in the mail If you have any lab test that is abnormal or we need to change your treatment, we will call you to review the results.   Testing/Procedures: None   Follow-Up: At [facility], you and your health needs are our priority.  As part of our continuing mission to provide you with exceptional heart care, we have created designated Provider Care Teams.  These Care Teams include your primary Cardiologist (physician) and Advanced Practice Providers (APPs -  Physician Assistants and Nurse Practitioners) who all work together to provide you with the care you need, when you need it.   Your next appointment:   01/12/24 at 2:00 pm with Dr. Jimmey Ralph 01/13/24 at 4:20 pm with Dr. Okey Dupre

## 2023-12-09 ENCOUNTER — Ambulatory Visit: Payer: PPO | Admitting: Internal Medicine

## 2023-12-14 ENCOUNTER — Encounter (HOSPITAL_COMMUNITY): Payer: Self-pay

## 2023-12-16 ENCOUNTER — Ambulatory Visit: Admission: RE | Admit: 2023-12-16 | Payer: PPO | Source: Ambulatory Visit

## 2024-01-04 DIAGNOSIS — H02054 Trichiasis without entropian left upper eyelid: Secondary | ICD-10-CM | POA: Diagnosis not present

## 2024-01-06 DIAGNOSIS — M533 Sacrococcygeal disorders, not elsewhere classified: Secondary | ICD-10-CM | POA: Diagnosis not present

## 2024-01-07 DIAGNOSIS — J9611 Chronic respiratory failure with hypoxia: Secondary | ICD-10-CM | POA: Diagnosis not present

## 2024-01-07 DIAGNOSIS — J449 Chronic obstructive pulmonary disease, unspecified: Secondary | ICD-10-CM | POA: Diagnosis not present

## 2024-01-09 NOTE — Progress Notes (Deleted)
 Electrophysiology Office Note:   Date:  01/09/2024  ID:  Tiffany Caldwell, Tiffany Caldwell 1940-10-15, MRN [id number]  Primary Cardiologist: Yvonne Kendall, MD Primary Heart Failure: None Electrophysiologist: Nobie Putnam, MD      History of Present Illness:   Tiffany Caldwell is a 83 y.o. female with h/o CAD status post remote PCI with subsequent emergent three-vessel CABG for iatrogenic left main/LAD dissection in 2001, right lower lobe cavitary lung mass, labile hypertension, hyperlipidemia, chronic diastolic heart failure, left-sided breast cancer, COPD, IBS, GERD who presents for  seen today for evaluation of PVCs.   Patient was seen by general cardiology in October 2024 and was reporting some SOB and fatigue at that visit. EKG was done and showed sinus rhythm with PVCs. A Zio monitor was then ordered which revealed a PVC burden of 22.7%, for which she has been referred for EP evaluation. Today, she reports doing relatively well. She continues to have baseline shortness of breath which is attributed to her COPD and emphysema. She went to ED on 12/25 for fatigue and confusion. She was discharged same day without any obvious cause despite thorough workup. She denies any history of palpitations/fluttering/skipping beats in chest.   Review of systems complete and found to be negative unless listed in HPI.   EP Information / Studies Reviewed:    EKG is ordered today. Personal review as below.      Zio Monitor 08/03/23:    The patient was monitored for 13 days, 23 hours.   The predominant rhythm was sinus with an average rate of 62 bpm (range 42-86 bpm in sinus).   There were occasional PACs (4.2% burden) and frequent PVCs (22.7% burden).   3 episodes of nonsustained ventricular tachycardia occurred, lasting up to 9 beats with a maximum rate of 176 bpm.   There were 41 supraventricular runs, lasting up to 16 beats with a maximum rate of 164 bpm.   No sustained arrhythmia or prolonged pause was  observed.   There were no patient triggered events.   Predominantly sinus rhythm with frequent PVCs and occasional PACs.  Three episodes of NSVT and multiple supraventricular runs were observed, as detailed above.  Echo 05/12/23:   1. No LVOT obstruction.. Left ventricular ejection fraction, by  estimation, is 55 to 60%. The left ventricle has normal function. The left  ventricle has no regional wall motion abnormalities. There is severe  asymmetric left ventricular hypertrophy of  the basal-septal segment. Left ventricular diastolic parameters are  consistent with Grade II diastolic dysfunction (pseudonormalization).   2. Right ventricular systolic function is low normal. The right  ventricular size is normal. There is moderately elevated pulmonary artery  systolic pressure.   3. Left atrial size was severely dilated.   4. Right atrial size was mild to moderately dilated.   5. The mitral valve is normal in structure. Mild to moderate mitral valve  regurgitation.   6. The aortic valve is tricuspid. Aortic valve regurgitation is mild.  Aortic valve sclerosis is present, with no evidence of aortic valve  stenosis.   7. The inferior vena cava is dilated in size with >50% respiratory  variability, suggesting right atrial pressure of 8 mmHg.   LHC/RHC 06/17/23:  Conclusions: Severe single-vessel coronary artery disease with chronic total occlusion of proximal/mid LAD stent, with mid/distal LAD supplied by patent LIMA graft.  There is also 60-70% stenosis at the ostium of OM1, 50% mid LCx disease, and 30% proximal RCA stenosis. Widely patent LIMA-LAD. Chronically occluded  SVG-D1 and SVG-OM1. Normal left and right heart filling pressures (LVEDP 14 mmHg, mean RA 3 mmHg, RVEDP 6 mmHg). Mild to moderate pulmonary hypertension (PA 50/14, mean 26 mmHg). Normal Fick cardiac output/index (CO 5.3 L/min, CI 3.2 L/min/m).   Recommendations: Continue follow-up with pulmonology for management of  chronic lung disease that is most likely driving Tiffany Caldwell's chronic exertional dyspnea and mild to moderate pulmonary hypertension.  If symptoms persist, 1 could consider cardiac MRI to better evaluate severe asymmetric left ventricular hypertrophy as well as consultation with the advanced heart failure service. Aggressive secondary prevention of coronary artery disease.  Favor medical management of 60-70% ostial OM1 and 50% mid LCx disease.   Physical Exam:   VS:  LMP  (LMP Unknown)    Wt Readings from Last 3 Encounters:  12/01/23 120 lb 3.2 oz (54.5 kg)  11/06/23 126 lb 1.7 oz (57.2 kg)  11/01/23 121 lb 4.1 oz (55 kg)     GEN: Well nourished, well developed in no acute distress NECK: No JVD CARDIAC: Normal rate and irregular rhythm RESPIRATORY:  Clear to auscultation without rales, wheezing or rhonchi  ABDOMEN: Soft, non-tender, non-distended EXTREMITIES:  No edema; No deformity   ASSESSMENT AND PLAN:   Tiffany Caldwell is a 83 y.o. female with h/o CAD status post remote PCI with subsequent emergent three-vessel CABG for iatrogenic left main/LAD dissection in 2001, right lower lobe cavitary lung mass, labile hypertension, hyperlipidemia, chronic diastolic heart failure, left-sided breast cancer, COPD, IBS, GERD who presents for  seen today for evaluation of PVCs.   Patient recently wore Zio monitor which showed PVC burden of 22.7% and PAC burden 4.2%. It is unclear to what extent she is symptomatic. She denies palpitations and has baseline shortness of breath. Importantly, her LVEF is normal on last echocardiogram. Cardiac MRI is pending. If LVEF remains normal and patient is not overtly symptomatic then we do not have to be aggressive with PVC suppression; she is not an ablation candidate due to age and co-morbidities. A PVC burden of >20% does correlate with increased risk of developing a PVC induced cardiomyopathy. For this reason, I think it is reasonable to try and suppress PVCs to  some degree with more conservative measures, such as optimizing beta blocker and starting mexiletine. Would like to avoid amiodarone given underlying lung disease but if she were to develop a PVC induced cardiomyopathy than sotalol or amiodarone would be the next option.  #. Frequent unifocal PVCs: Appears to be from AL pap muscle. RBBB in V1 with right inferior axis.  - Start mexiletine 150mg  twice daily.  - Change carvedilol to metoprolol XL 25mg  twice daily.  - Repeat Zio monitor in 1 month to assess burden.  - Cardiac MRI is pending.   #. PACs:  - Change carvedilol to metoprolol XL 25mg  twice daily.   #. Chronic diastolic heart failure:  #. CAD s/p CABG - Appears relatively well compensated today.  - Continue current regimen and follow up with general cardiologist.  Follow up with Dr. Jimmey Ralph  in 8 weeks.    Total time of encounter: 67 minutes total time of encounter, including chart review, face-to-face patient care, coordination of care and counseling regarding high complexity medical decision making.   Signed, Nobie Putnam, MD

## 2024-01-10 ENCOUNTER — Emergency Department
Admission: EM | Admit: 2024-01-10 | Discharge: 2024-01-10 | Discharge status: Against Medical Advice | Attending: Emergency Medicine | Admitting: Emergency Medicine

## 2024-01-10 ENCOUNTER — Other Ambulatory Visit: Payer: Self-pay

## 2024-01-10 ENCOUNTER — Encounter: Payer: Self-pay | Admitting: Emergency Medicine

## 2024-01-10 DIAGNOSIS — I10 Essential (primary) hypertension: Secondary | ICD-10-CM | POA: Diagnosis not present

## 2024-01-10 DIAGNOSIS — R531 Weakness: Secondary | ICD-10-CM | POA: Diagnosis not present

## 2024-01-10 DIAGNOSIS — Z5321 Procedure and treatment not carried out due to patient leaving prior to being seen by health care provider: Secondary | ICD-10-CM | POA: Insufficient documentation

## 2024-01-10 LAB — CBC
HCT: 44.9 % (ref 36.0–46.0)
Hemoglobin: 15 g/dL (ref 12.0–15.0)
MCH: 33 pg (ref 26.0–34.0)
MCHC: 33.4 g/dL (ref 30.0–36.0)
MCV: 98.7 fL (ref 80.0–100.0)
Platelets: 147 10*3/uL — ABNORMAL LOW (ref 150–400)
RBC: 4.55 MIL/uL (ref 3.87–5.11)
RDW: 13.6 % (ref 11.5–15.5)
WBC: 5.2 10*3/uL (ref 4.0–10.5)
nRBC: 0 % (ref 0.0–0.2)

## 2024-01-10 LAB — BASIC METABOLIC PANEL WITH GFR
Anion gap: 9 (ref 5–15)
BUN: 19 mg/dL (ref 8–23)
CO2: 27 mmol/L (ref 22–32)
Calcium: 10.3 mg/dL (ref 8.9–10.3)
Chloride: 102 mmol/L (ref 98–111)
Creatinine, Ser: 0.71 mg/dL (ref 0.44–1.00)
GFR, Estimated: >60 mL/min (ref >60)
Glucose, Bld: 93 mg/dL (ref 70–99)
Potassium: 3.6 mmol/L (ref 3.5–5.1)
Sodium: 138 mmol/L (ref 135–145)

## 2024-01-10 LAB — URINALYSIS, ROUTINE W REFLEX MICROSCOPIC
Bacteria, UA: NONE SEEN
Bilirubin Urine: NEGATIVE
Glucose, UA: NEGATIVE mg/dL
Ketones, ur: NEGATIVE mg/dL
Leukocytes,Ua: NEGATIVE
Nitrite: NEGATIVE
Protein, ur: 30 mg/dL — AB
Specific Gravity, Urine: 1.005 (ref 1.005–1.030)
pH: 8 (ref 5.0–8.0)

## 2024-01-10 NOTE — ED Triage Notes (Signed)
 Pt in via [facility] with generalized weakness since waking this morning. Pt resides in independent living at [facility], has been feeling weak when she walks today and has had high BP readings all day. 184/134 in triage, denies any cp, sob or recent sick contacts. HR variable 60-120's en route with EMS

## 2024-01-10 NOTE — ED Notes (Signed)
 First Nurse: Pt BIB EMS from home c/o weakness all day since this early morning. Possible new onset A-Fib, rate 60-120, denies N/V/D, denies CP or SOB.   EMS vitals 190/100, recheck was 208/123 94% RA 60-120s,   EMS did not give any meds PTA

## 2024-01-10 NOTE — ED Notes (Signed)
 Pt called for treatment room, no answer from pt, pt and husband not seen in [facility] lobby, will call again momentary

## 2024-01-10 NOTE — ED Notes (Signed)
 Pt called for 2nd time, still no answer from pt or husband, another pt in lobby advised that pt and her husband left a while ago as they were all communicating. Assume pt has left the premises with her husband, will remove off [facility] board.

## 2024-01-11 ENCOUNTER — Other Ambulatory Visit: Payer: Self-pay

## 2024-01-11 ENCOUNTER — Telehealth: Payer: Self-pay | Admitting: Emergency Medicine

## 2024-01-11 ENCOUNTER — Observation Stay
Admission: EM | Admit: 2024-01-11 | Discharge: 2024-01-12 | Disposition: A | Discharge status: Home / Self Care | Attending: Internal Medicine | Admitting: Internal Medicine

## 2024-01-11 ENCOUNTER — Emergency Department

## 2024-01-11 DIAGNOSIS — I4891 Unspecified atrial fibrillation: Secondary | ICD-10-CM | POA: Diagnosis not present

## 2024-01-11 DIAGNOSIS — I130 Hypertensive heart and chronic kidney disease with heart failure and stage 1 through stage 4 chronic kidney disease, or unspecified chronic kidney disease: Secondary | ICD-10-CM | POA: Insufficient documentation

## 2024-01-11 DIAGNOSIS — I2510 Atherosclerotic heart disease of native coronary artery without angina pectoris: Secondary | ICD-10-CM | POA: Insufficient documentation

## 2024-01-11 DIAGNOSIS — I10 Essential (primary) hypertension: Secondary | ICD-10-CM | POA: Diagnosis not present

## 2024-01-11 DIAGNOSIS — Z955 Presence of coronary angioplasty implant and graft: Secondary | ICD-10-CM | POA: Diagnosis not present

## 2024-01-11 DIAGNOSIS — Z79899 Other long term (current) drug therapy: Secondary | ICD-10-CM | POA: Insufficient documentation

## 2024-01-11 DIAGNOSIS — I5032 Chronic diastolic (congestive) heart failure: Secondary | ICD-10-CM | POA: Insufficient documentation

## 2024-01-11 DIAGNOSIS — R7989 Other specified abnormal findings of blood chemistry: Secondary | ICD-10-CM | POA: Insufficient documentation

## 2024-01-11 DIAGNOSIS — J449 Chronic obstructive pulmonary disease, unspecified: Secondary | ICD-10-CM | POA: Diagnosis not present

## 2024-01-11 DIAGNOSIS — D696 Thrombocytopenia, unspecified: Secondary | ICD-10-CM | POA: Insufficient documentation

## 2024-01-11 DIAGNOSIS — N1831 Chronic kidney disease, stage 3a: Secondary | ICD-10-CM | POA: Diagnosis not present

## 2024-01-11 DIAGNOSIS — I480 Paroxysmal atrial fibrillation: Principal | ICD-10-CM | POA: Insufficient documentation

## 2024-01-11 DIAGNOSIS — Z87891 Personal history of nicotine dependence: Secondary | ICD-10-CM | POA: Diagnosis not present

## 2024-01-11 DIAGNOSIS — Z1152 Encounter for screening for COVID-19: Secondary | ICD-10-CM | POA: Diagnosis not present

## 2024-01-11 DIAGNOSIS — Z96641 Presence of right artificial hip joint: Secondary | ICD-10-CM | POA: Insufficient documentation

## 2024-01-11 DIAGNOSIS — Z951 Presence of aortocoronary bypass graft: Secondary | ICD-10-CM | POA: Insufficient documentation

## 2024-01-11 DIAGNOSIS — I491 Atrial premature depolarization: Secondary | ICD-10-CM | POA: Diagnosis not present

## 2024-01-11 DIAGNOSIS — Z853 Personal history of malignant neoplasm of breast: Secondary | ICD-10-CM | POA: Insufficient documentation

## 2024-01-11 DIAGNOSIS — R0602 Shortness of breath: Secondary | ICD-10-CM | POA: Diagnosis present

## 2024-01-11 DIAGNOSIS — I5033 Acute on chronic diastolic (congestive) heart failure: Secondary | ICD-10-CM | POA: Diagnosis present

## 2024-01-11 DIAGNOSIS — Z7982 Long term (current) use of aspirin: Secondary | ICD-10-CM | POA: Diagnosis not present

## 2024-01-11 DIAGNOSIS — R531 Weakness: Secondary | ICD-10-CM | POA: Diagnosis not present

## 2024-01-11 DIAGNOSIS — I517 Cardiomegaly: Secondary | ICD-10-CM | POA: Diagnosis not present

## 2024-01-11 DIAGNOSIS — J439 Emphysema, unspecified: Secondary | ICD-10-CM | POA: Diagnosis not present

## 2024-01-11 DIAGNOSIS — R778 Other specified abnormalities of plasma proteins: Secondary | ICD-10-CM | POA: Diagnosis not present

## 2024-01-11 DIAGNOSIS — R000 Tachycardia, unspecified: Secondary | ICD-10-CM | POA: Diagnosis not present

## 2024-01-11 DIAGNOSIS — I700 Atherosclerosis of aorta: Secondary | ICD-10-CM | POA: Diagnosis not present

## 2024-01-11 DIAGNOSIS — R002 Palpitations: Secondary | ICD-10-CM | POA: Diagnosis not present

## 2024-01-11 LAB — TSH
TSH: 3.969 u[IU]/mL (ref 0.350–4.500)
TSH: 5.503 u[IU]/mL — ABNORMAL HIGH (ref 0.350–4.500)

## 2024-01-11 LAB — COMPREHENSIVE METABOLIC PANEL WITH GFR
ALT: 32 U/L (ref 0–44)
AST: 43 U/L — ABNORMAL HIGH (ref 15–41)
Albumin: 3.9 g/dL (ref 3.5–5.0)
Alkaline Phosphatase: 91 U/L (ref 38–126)
Anion gap: 9 (ref 5–15)
BUN: 20 mg/dL (ref 8–23)
CO2: 28 mmol/L (ref 22–32)
Calcium: 10.8 mg/dL — ABNORMAL HIGH (ref 8.9–10.3)
Chloride: 101 mmol/L (ref 98–111)
Creatinine, Ser: 0.91 mg/dL (ref 0.44–1.00)
GFR, Estimated: >60 mL/min (ref >60)
Glucose, Bld: 98 mg/dL (ref 70–99)
Potassium: 3.9 mmol/L (ref 3.5–5.1)
Sodium: 138 mmol/L (ref 135–145)
Total Bilirubin: 1 mg/dL (ref 0.0–1.2)
Total Protein: 7.3 g/dL (ref 6.5–8.1)

## 2024-01-11 LAB — CBC WITH DIFFERENTIAL/PLATELET
Abs Immature Granulocytes: 0.01 10*3/uL (ref 0.00–0.07)
Basophils Absolute: 0 10*3/uL (ref 0.0–0.1)
Basophils Relative: 0 %
Eosinophils Absolute: 0 10*3/uL (ref 0.0–0.5)
Eosinophils Relative: 0 %
HCT: 44.7 % (ref 36.0–46.0)
Hemoglobin: 15 g/dL (ref 12.0–15.0)
Immature Granulocytes: 0 %
Lymphocytes Relative: 14 %
Lymphs Abs: 0.8 10*3/uL (ref 0.7–4.0)
MCH: 33.5 pg (ref 26.0–34.0)
MCHC: 33.6 g/dL (ref 30.0–36.0)
MCV: 99.8 fL (ref 80.0–100.0)
Monocytes Absolute: 0.7 10*3/uL (ref 0.1–1.0)
Monocytes Relative: 12 %
Neutro Abs: 4.1 10*3/uL (ref 1.7–7.7)
Neutrophils Relative %: 74 %
Platelets: 136 10*3/uL — ABNORMAL LOW (ref 150–400)
RBC: 4.48 MIL/uL (ref 3.87–5.11)
RDW: 13.5 % (ref 11.5–15.5)
WBC: 5.6 10*3/uL (ref 4.0–10.5)
nRBC: 0 % (ref 0.0–0.2)

## 2024-01-11 LAB — MAGNESIUM
Magnesium: 2.1 mg/dL (ref 1.7–2.4)
Magnesium: 2 mg/dL (ref 1.7–2.4)

## 2024-01-11 LAB — PROTIME-INR
INR: 1.1 (ref 0.8–1.2)
Prothrombin Time: 14.3 s (ref 11.4–15.2)

## 2024-01-11 LAB — RESP PANEL BY RT-PCR (RSV, FLU A&B, COVID)  RVPGX2
Influenza A by PCR: NEGATIVE
Influenza B by PCR: NEGATIVE
Resp Syncytial Virus by PCR: NEGATIVE
SARS Coronavirus 2 by RT PCR: NEGATIVE

## 2024-01-11 LAB — APTT: aPTT: 32 s (ref 24–36)

## 2024-01-11 LAB — BRAIN NATRIURETIC PEPTIDE: B Natriuretic Peptide: 876.8 pg/mL — ABNORMAL HIGH (ref 0.0–100.0)

## 2024-01-11 LAB — TROPONIN I (HIGH SENSITIVITY)
Troponin I (High Sensitivity): 91 ng/L — ABNORMAL HIGH (ref <18)
Troponin I (High Sensitivity): 98 ng/L — ABNORMAL HIGH (ref <18)

## 2024-01-11 LAB — PHOSPHORUS: Phosphorus: 3.1 mg/dL (ref 2.5–4.6)

## 2024-01-11 MED ORDER — UMECLIDINIUM-VILANTEROL 62.5-25 MCG/ACT IN AEPB
1.0000 | INHALATION_SPRAY | Freq: Every day | RESPIRATORY_TRACT | Status: DC
  Administered 2024-01-12: 1 via RESPIRATORY_TRACT
  Filled 2024-01-11: qty 14

## 2024-01-11 MED ORDER — GABAPENTIN 300 MG PO CAPS
300.0000 mg | ORAL_CAPSULE | Freq: Three times a day (TID) | ORAL | Status: DC
  Administered 2024-01-11 – 2024-01-12 (×3): 300 mg via ORAL
  Filled 2024-01-11 (×3): qty 1

## 2024-01-11 MED ORDER — SODIUM CHLORIDE 0.9% FLUSH
3.0000 mL | INTRAVENOUS | Status: DC | PRN
  Administered 2024-01-12: 3 mL via INTRAVENOUS

## 2024-01-11 MED ORDER — FUROSEMIDE 10 MG/ML IJ SOLN
20.0000 mg | Freq: Two times a day (BID) | INTRAMUSCULAR | Status: DC
  Administered 2024-01-11 – 2024-01-12 (×2): 20 mg via INTRAVENOUS
  Filled 2024-01-11 (×2): qty 4

## 2024-01-11 MED ORDER — METOPROLOL TARTRATE 5 MG/5ML IV SOLN: 2.5000 mg | INTRAVENOUS | Status: DC | PRN

## 2024-01-11 MED ORDER — ATORVASTATIN CALCIUM 20 MG PO TABS
20.0000 mg | ORAL_TABLET | Freq: Every day | ORAL | Status: DC
  Administered 2024-01-11 – 2024-01-12 (×2): 20 mg via ORAL
  Filled 2024-01-11 (×2): qty 1

## 2024-01-11 MED ORDER — SODIUM CHLORIDE 0.9 % IV SOLN: 250.0000 mL | INTRAVENOUS | Status: AC | PRN

## 2024-01-11 MED ORDER — ONDANSETRON HCL 4 MG/2ML IJ SOLN: 4.0000 mg | Freq: Four times a day (QID) | INTRAMUSCULAR | Status: DC | PRN

## 2024-01-11 MED ORDER — CARVEDILOL 6.25 MG PO TABS
6.2500 mg | ORAL_TABLET | Freq: Two times a day (BID) | ORAL | Status: DC
  Administered 2024-01-11: 6.25 mg via ORAL
  Filled 2024-01-11: qty 1

## 2024-01-11 MED ORDER — LORATADINE 10 MG PO TABS
10.0000 mg | ORAL_TABLET | Freq: Every day | ORAL | Status: DC
  Administered 2024-01-12: 10 mg via ORAL
  Filled 2024-01-11: qty 1

## 2024-01-11 MED ORDER — ACETAMINOPHEN 325 MG PO TABS: 650.0000 mg | ORAL_TABLET | ORAL | Status: DC | PRN

## 2024-01-11 MED ORDER — SODIUM CHLORIDE 0.9% FLUSH
3.0000 mL | Freq: Two times a day (BID) | INTRAVENOUS | Status: DC
  Administered 2024-01-11 – 2024-01-12 (×3): 3 mL via INTRAVENOUS

## 2024-01-11 MED ORDER — MEXILETINE HCL 150 MG PO CAPS
150.0000 mg | ORAL_CAPSULE | Freq: Two times a day (BID) | ORAL | Status: DC
  Administered 2024-01-11 – 2024-01-12 (×2): 150 mg via ORAL
  Filled 2024-01-11 (×3): qty 1

## 2024-01-11 MED ORDER — POTASSIUM CHLORIDE CRYS ER 20 MEQ PO TBCR: 10.0000 meq | EXTENDED_RELEASE_TABLET | ORAL | Status: DC

## 2024-01-11 MED ORDER — APIXABAN 2.5 MG PO TABS
2.5000 mg | ORAL_TABLET | Freq: Two times a day (BID) | ORAL | Status: DC
Start: 2024-01-11 — End: 2024-01-12
  Administered 2024-01-11 – 2024-01-12 (×2): 2.5 mg via ORAL
  Filled 2024-01-11 (×3): qty 1

## 2024-01-11 NOTE — ED Provider Notes (Signed)
 [facility] Provider Note    Event Date/Time   First MD Initiated Contact with Patient 01/11/24 1227     (approximate)   History   Abnormal Lab Pt arrives via ems from home with c/o an elevated troponin that was called in by her PCP. Pt denies CP and SOB. Ems administered 10mg  of cardizem in route. Pt in afib at time of arrival.   HPI  Tiffany Caldwell is a 83 y.o. female PMH COPD, IBS, hyperlipidemia, pulmonary hypertension, diastolic CHF, CKD 3, breast cancer - per EMS, patient was notably tachycardic, received 10 mg diltiazem IV and round given A-fib RVR -Patient and husband at bedside, she has been feeling weak with palpitations over the past few days.  No infectious symptoms.  No chest pain.  No prior history of atrial fibrillation  Per chart review, last admitted in late January of this year for possible pneumonia and hypoxia     Physical Exam   Triage Vital Signs:   Most recent vital signs: Vitals:   01/11/24 1500 01/11/24 1545  BP: (!) 161/142 (!) 191/94  Pulse: 86 (!) 147  Resp: 19   Temp:    SpO2: 96% 93%     General: Awake, no distress.  CV:  Good peripheral perfusion.  Irregular rate, regular rhythm.  RP 2+. Resp:  Normal effort.  CTAB. Abd:  No distention.  No tenderness to palpation throughout.    ED Results / Procedures / Treatments   Labs (all labs ordered are listed, but only abnormal results are displayed) Labs Reviewed  CBC WITH DIFFERENTIAL/PLATELET - Abnormal; Notable for the following components:      Result Value   Platelets 136 (*)    All other components within normal limits  COMPREHENSIVE METABOLIC PANEL WITH GFR - Abnormal; Notable for the following components:   Calcium 10.8 (*)    AST 43 (*)    All other components within normal limits  BRAIN NATRIURETIC PEPTIDE - Abnormal; Notable for the following components:   B Natriuretic Peptide 876.8 (*)    All other components within normal limits  TROPONIN  I (HIGH SENSITIVITY) - Abnormal; Notable for the following components:   Troponin I (High Sensitivity) 98 (*)    All other components within normal limits  RESP PANEL BY RT-PCR (RSV, FLU A&B, COVID)  [id number]  PROTIME-INR  APTT  MAGNESIUM  PHOSPHORUS  URINALYSIS, COMPLETE (UACMP) WITH MICROSCOPIC  TSH     EKG  See ED course below   RADIOLOGY See ED course below  PROCEDURES:  Critical Care performed: No  Procedures   MEDICATIONS ORDERED IN ED: Medications  metoprolol tartrate (LOPRESSOR) injection 2.5 mg (has no administration in time range)  furosemide (LASIX) injection 20 mg (has no administration in time range)  carvedilol (COREG) tablet 6.25 mg (has no administration in time range)  atorvastatin (LIPITOR) tablet 20 mg (has no administration in time range)  mexiletine (MEXITIL) capsule 150 mg (has no administration in time range)  gabapentin (NEURONTIN) capsule 300 mg (has no administration in time range)  potassium chloride SA (KLOR-CON M) CR tablet 10 mEq (has no administration in time range)  loratadine (CLARITIN) tablet 10 mg (has no administration in time range)  umeclidinium-vilanterol (ANORO ELLIPTA) 62.5-25 MCG/ACT 1 puff (has no administration in time range)  sodium chloride flush (NS) 0.9 % injection 3 mL (has no administration in time range)  sodium chloride flush (NS) 0.9 % injection 3 mL (has no administration in time range)  0.9 %  sodium chloride infusion (has no administration in time range)  acetaminophen (TYLENOL) tablet 650 mg (has no administration in time range)  ondansetron (ZOFRAN) injection 4 mg (has no administration in time range)     IMPRESSION / MDM / ASSESSMENT AND PLAN / ED COURSE  I reviewed the triage vital signs and the nursing notes.                              Differential diagnosis includes, but is not limited to, new onset arrhythmia, consider underlying electrolyte abnormality, anemia, CHF, ACS.  Clinically doubt pulmonary  embolism.  Also consider underlying infection such as viral syndrome including covid 19, pneumonia or UTI or given generalized fatigue in this elderly patient.  Appears to be responding well to initial dose hide exam, does have some intermittent mild bradycardia to the upper 50s--will defer oral loading at this time.   Plan -Labs -EKG -Chest x-ray -Consider admission for new onset A-fib RVR  Patient's presentation is most consistent with acute presentation with potential threat to life or bodily function.  The patient is on the cardiac monitor to evaluate for evidence of arrhythmia and/or significant heart rate changes.  ED Course below. Workup w/ mild-moderately elevated troponin (suspect type II NSTEMI in the setting of A-fib RVR.).  Admitted to medicine service.   Clinical Course as of 01/11/24 1615  Mon Jan 11, 2024  1249 Cg = atrial fibrillation with intermittent sinus rhythm, rate 94, no ST elevation or depression, no significant repolarization abnormality, normal axis, normal intervals.  No evidence of ischemia on my read. [MM]  1341 Cbc wnl [MM]  1352 X-ray reviewed, no acute pathology on my interpretation [MM]  1415 BNP elevated, similar to 2 months ago [MM]  1439 Troponin mildly elevated, suspect type II NSTEMI in the setting of A-fib RVR   Hospitalist consult order placed  Viral swab neg [MM]  1458 D/w Dr. Chipper Herb hospitalist for admission [MM]    Clinical Course User Index [MM] Marinell Blight, MD     FINAL CLINICAL IMPRESSION(S) / ED DIAGNOSES   Final diagnoses:  Atrial fibrillation with rapid ventricular response (HCC)  Elevated troponin     Rx / DC Orders   ED Discharge Orders     None        Note:  This document was prepared using Dragon voice recognition software and may include unintentional dictation errors.   Marinell Blight, MD 01/11/24 [phone]

## 2024-01-11 NOTE — ED Notes (Signed)
 This RN gave report to [employer] and performed care handoff via phone call. pt updated on plan of care.

## 2024-01-11 NOTE — ED Notes (Signed)
 This RN gave report to [facility] RN and performed care handoff. Call light in reach, bed wheels locked, side rail raised, pt updated on plan of care. Rounding completed. Fall precautions in place.

## 2024-01-11 NOTE — ED Notes (Signed)
 Pt given water

## 2024-01-11 NOTE — ED Notes (Signed)
 This RN introduced self to pt. Call light in reach, bed wheels locked, side rail raised, pt updated on plan of care. Rounding completed. Fall risk precautions in place. Husband bedside.

## 2024-01-11 NOTE — ED Triage Notes (Signed)
 Pt arrives via ems from home with c/o an elevated troponin that was called in by her PCP. Pt denies CP and SOB. Ems administered 10mg  of cardizem in route. Pt in afib at time of arrival.

## 2024-01-11 NOTE — Consult Note (Signed)
 PHARMACY - ANTICOAGULATION CONSULT NOTE  Pharmacy Consult for Apixaban Indication: atrial fibrillation  Allergies  Allergen Reactions   Amlodipine Swelling    Leg swelling and fatigue.   Hydromorphone Other (See Comments)    ALTERED MENTAL STATUS Altered mental status   Azithromycin Rash   Ciprofloxacin Nausea Only and Nausea And Vomiting   Hydromorphone Hcl Other (See Comments)    Altered mental status Altered mental status   Levofloxacin Nausea Only and Other (See Comments)   Lisinopril Other (See Comments)    Fatigue  Fatigue    Patient Measurements: Height: 5\' 3"  (160 cm) Weight: 53.8 kg (118 lb 9.7 oz) IBW/kg (Calculated) : 52.4 HEPARIN DW (KG): 53.8  Vital Signs: Temp: 97.9 F (36.6 C) (04/07 1236) Temp Source: Oral (04/07 1236) BP: 191/94 (04/07 1545) Pulse Rate: 147 (04/07 1545)  Labs: Recent Labs    01/10/24 1959 01/11/24 1243  HGB 15.0 15.0  HCT 44.9 44.7  PLT 147* 136*  APTT  --  32  LABPROT  --  14.3  INR  --  1.1  CREATININE 0.71 0.91  TROPONINIHS 91* 98*    Estimated Creatinine Clearance: 38.1 mL/min (by C-G formula based on SCr of 0.91 mg/dL).   Medical History: Past Medical History:  Diagnosis Date   Allergy    Arthritis    Breast cancer (HCC)    left breast   Coronary artery disease 2001   CABG   DDD (degenerative disc disease), lumbosacral    Dyspnea    if rushing or climmbing lots of stairs   Emphysema lung (HCC)    emphysema   GERD (gastroesophageal reflux disease)    11/20/16- no a current problem   Hyperlipidemia    Hypertension    Irritable bowel syndrome (IBS)    Lung disorder    leison   Neuropathy    Pinched nerve    back and left leg   Pneumonia 2016    Assessment: Patient with hx of COPD, CAD, chronic HFpEF, HCOM, CKD stage IIIa, chronic thrombocytopenia, pulmonary MAI, IBS, PVC/PAC, breast cancer. Presented to ED with generalized weakness and exertional dyspnea. Pharmacy consulted to initiate Apixaban for  nonvalvular Afib.  Relevant baseline labs: hgb 15, plt 136, Scr 0.91  Plan:  Start apixaban 2.5mg  po BID for nonvalvular Afib (age 83, wt 53.8kg, Scr 0.91).  Amorina Doerr Rodriguez-Guzman PharmD, BCPS 01/11/2024 5:21 PM

## 2024-01-11 NOTE — H&P (Signed)
 History and Physical    Tiffany Caldwell ZOX:[id number] DOB: 12-Nov-1940 DOA: 01/11/2024  PCP: Delma Officer, PA (Confirm with patient/family/NH records and if not entered, this has to be entered at [facility] point of entry) Patient coming from: Home  I have personally briefly reviewed patient's old medical records in [facility]  Chief Complaint: Feeling weak, SOB  HPI: Tiffany Caldwell is a 83 y.o. female with medical history significant of COPD, CAD, chronic HFpEF, HCOM, CKD stage IIIa, chronic thrombocytopenia, pulmonary MAI, IBS, PVC/PAC, breast cancer presented with worsening of generalized weakness and exertional dyspnea.  Symptoms started 2 days ago, patient started to develop generalized weakness and exertional dyspnea denied any chest pain no cough no fever or chills.  She has a history of CHF and takes Lasix Monday Wednesday Friday but denied any increasing peripheral edema.  She does admitted that her blood pressure has been running high.  She has been compliant with her medications.  ED Course: Afebrile, rapid A-fib heart rate ranging from 60-1 20, blood pressure SBP 170-180.  Chest x-ray negative for acute infiltrates, troponin 91> 98.  Blood work showed K3.9 bicarb 28 creatinine 0.9 WBC 5.6 hemoglobin 15 platelet 136.  ED.  After start patient on Cardizem bolus and drip the patient came bradycardic and Cardizem discontinued.   Review of Systems: As per HPI otherwise 14 point review of systems negative.    Past Medical History:  Diagnosis Date   Allergy    Arthritis    Breast cancer (HCC)    left breast   Coronary artery disease 2001   CABG   DDD (degenerative disc disease), lumbosacral    Dyspnea    if rushing or climmbing lots of stairs   Emphysema lung (HCC)    emphysema   GERD (gastroesophageal reflux disease)    11/20/16- no a current problem   Hyperlipidemia    Hypertension    Irritable bowel syndrome (IBS)    Lung disorder    leison   Neuropathy     Pinched nerve    back and left leg   Pneumonia 2016    Past Surgical History:  Procedure Laterality Date   BREAST FIBROADENOMA SURGERY  01/13/1980   BREAST SURGERY  02/01/2004   tram/mastectomyfor recurrence   CARPOMETACARPEL SUSPENSION PLASTY Right 12/17/2017   Procedure: SUSPENSIONPLASTY RIGHT THUMB WITH EXCISION TRAPEZIUM;  Surgeon: Cindee Salt, MD;  Location: [facility];  Service: Orthopedics;  Laterality: Right;   COLONOSCOPY     CORONARY ANGIOPLASTY  2001   had tear to two vessels- had to have open heart surgery   CORONARY ARTERY BYPASS GRAFT  2001   EYE SURGERY Bilateral    Cataract   KNEE ARTHROSCOPY  January 2011   right   MASTECTOMY PARTIAL / LUMPECTOMY W/ AXILLARY LYMPHADENECTOMY  04/10/1989   Dr Maple Hudson / left breast   PARTIAL HYSTERECTOMY     vaginal   RIGHT/LEFT HEART CATH AND CORONARY ANGIOGRAPHY Bilateral 06/17/2023   Procedure: RIGHT/LEFT HEART CATH AND CORONARY ANGIOGRAPHY;  Surgeon: Yvonne Kendall, MD;  Location: [facility];  Service: Cardiovascular;  Laterality: Bilateral;   TENDON TRANSFER Right 12/17/2017   Procedure: ABDUCTOR POLLICIS LONGUS TRANSFER;  Surgeon: Cindee Salt, MD;  Location: [facility];  Service: Orthopedics;  Laterality: Right;   TONSILLECTOMY AND ADENOIDECTOMY     TOTAL HIP ARTHROPLASTY Right 08/05/2016   Procedure: TOTAL HIP ARTHROPLASTY ANTERIOR APPROACH;  Surgeon: Marcene Corning, MD;  Location: [facility];  Service:  Orthopedics;  Laterality: Right;   UPPER GASTROINTESTINAL ENDOSCOPY     VIDEO BRONCHOSCOPY WITH ENDOBRONCHIAL NAVIGATION N/A 11/27/2016   Procedure: VIDEO BRONCHOSCOPY WITH ENDOBRONCHIAL NAVIGATION;  Surgeon: Loreli Slot, MD;  Location: [facility] OR;  Service: Thoracic;  Laterality: N/A;     reports that she quit smoking about 34 years ago. Her smoking use included cigarettes. She started smoking about 64 years ago. She has never used smokeless tobacco. She reports current alcohol use of about 1.0  standard drink of alcohol per week. She reports that she does not use drugs.  Allergies  Allergen Reactions   Amlodipine Swelling    Leg swelling and fatigue.   Hydromorphone Other (See Comments)    ALTERED MENTAL STATUS Altered mental status   Azithromycin Rash   Ciprofloxacin Nausea Only and Nausea And Vomiting   Hydromorphone Hcl Other (See Comments)    Altered mental status Altered mental status   Levofloxacin Nausea Only and Other (See Comments)   Lisinopril Other (See Comments)    Fatigue  Fatigue    Family History  Problem Relation Age of Onset   Emphysema Father        deceased   Heart disease Mother        deceased   Colon cancer Paternal Grandmother    Colon cancer Son    Colon cancer Paternal Uncle     Prior to Admission medications   Medication Sig Start Date End Date Taking? Authorizing Provider  acetaminophen (TYLENOL) 500 MG tablet Take 1,000 mg by mouth 2 (two) times daily.     [provider]  albuterol (VENTOLIN HFA) 108 (90 Base) MCG/ACT inhaler INHALE 1-2 PUFFS BY MOUTH EVERY 6 HOURS AS NEEDED FOR WHEEZING OR SHORTNESS OF BREATH Patient taking differently: Inhale 1-2 puffs into the lungs every 6 (six) hours as needed for wheezing or shortness of breath. prn 06/20/23   Leslye Peer, MD  aspirin EC 81 MG tablet Take 81 mg by mouth daily.    [provider]  atorvastatin (LIPITOR) 20 MG tablet Take 1 tablet (20 mg total) by mouth daily. 03/13/23   End, Cristal Deer, MD  Biotin 16109 MCG TABS Take 1 tablet by mouth daily.    [provider]  carvedilol (COREG) 3.125 MG tablet Take 3.125 mg by mouth 2 (two) times daily with a meal. 11/04/23   [provider]  feeding supplement (ENSURE ENLIVE / ENSURE PLUS) LIQD Take 237 mLs by mouth 2 (two) times daily between meals. 11/08/23   Enedina Finner, MD  fexofenadine (ALLEGRA) 180 MG tablet Take 180 mg by mouth daily.    [provider]  fish oil-omega-3 fatty acids 1000 MG  capsule Take 1 g by mouth daily.     [provider]  furosemide (LASIX) 20 MG tablet Take 1 tablet (20 mg total) by mouth every other day. Take 1 tablet 3 times a week (Monday, Wednesday and Friday) as needed. 11/09/23   Enedina Finner, MD  gabapentin (NEURONTIN) 300 MG capsule Take 300 mg by mouth 3 (three) times daily.    [provider]  Glucosamine-Chondroit-Vit C-Mn (GLUCOSAMINE CHONDR 1500 COMPLX PO) Take 1 tablet by mouth once.    [provider]  mexiletine (MEXITIL) 150 MG capsule Take 1 capsule (150 mg total) by mouth 2 (two) times daily. 10/13/23   Nobie Putnam, MD  Multiple Vitamin (MULTIVITAMIN) tablet Take 1 tablet by mouth daily.      [provider]  nitroGLYCERIN (NITROSTAT) 0.4 MG  SL tablet Place 0.4 mg under the tongue every 5 (five) minutes as needed for chest pain.    [provider]  potassium chloride (KLOR-CON M) 10 MEQ tablet Take 1 tablet (10 mEq total) by mouth every other day. 11/09/23   Enedina Finner, MD  umeclidinium-vilanterol [facility] ELLIPTA) 62.5-25 MCG/ACT AEPB INHALE 1 PUFF INTO THE LUNGS BY MOUTH ONCE DAILY 11/04/23   Leslye Peer, MD    Physical Exam: Vitals:   01/11/24 1415 01/11/24 1430 01/11/24 1445 01/11/24 1500  BP:  (!) 175/83  (!) 161/142  Pulse: (!) 46 (!) 106 97 86  Resp: 14 (!) 26 (!) 29 19  Temp:      TempSrc:      SpO2: 97% 97% 97% 96%  Weight:      Height:        Constitutional: NAD, calm, comfortable Vitals:   01/11/24 1415 01/11/24 1430 01/11/24 1445 01/11/24 1500  BP:  (!) 175/83  (!) 161/142  Pulse: (!) 46 (!) 106 97 86  Resp: 14 (!) 26 (!) 29 19  Temp:      TempSrc:      SpO2: 97% 97% 97% 96%  Weight:      Height:       Eyes: PERRL, lids and conjunctivae normal ENMT: Mucous membranes are moist. Posterior pharynx clear of any exudate or lesions.Normal dentition.  Neck: normal, supple, no masses, no thyromegaly Respiratory: clear to auscultation bilaterally, no wheezing, no crackles.  Normal respiratory effort. No accessory muscle use.  Cardiovascular: Irregular heart rate, tachycardia, no murmurs / rubs / gallops. 2+ extremity edema. 2+ pedal pulses. No carotid bruits.  Abdomen: no tenderness, no masses palpated. No hepatosplenomegaly. Bowel sounds positive.  Musculoskeletal: no clubbing / cyanosis. No joint deformity upper and lower extremities. Good ROM, no contractures. Normal muscle tone.  Skin: no rashes, lesions, ulcers. No induration Neurologic: CN 2-12 grossly intact. Sensation intact, DTR normal. Strength 5/5 in all 4.  Psychiatric: Normal judgment and insight. Alert and oriented x 3. Normal mood.    Labs on Admission: I have personally reviewed following labs and imaging studies  CBC: Recent Labs  Lab 01/10/24 1959 01/11/24 1243  WBC 5.2 5.6  NEUTROABS  --  4.1  HGB 15.0 15.0  HCT 44.9 44.7  MCV 98.7 99.8  PLT 147* 136*   Basic Metabolic Panel: Recent Labs  Lab 01/10/24 1959 01/11/24 1243  NA 138 138  K 3.6 3.9  CL 102 101  CO2 27 28  GLUCOSE 93 98  BUN 19 20  CREATININE 0.71 0.91  CALCIUM 10.3 10.8*  MG 2.1  --    GFR: Estimated Creatinine Clearance: 38.1 mL/min (by C-G formula based on SCr of 0.91 mg/dL). Liver Function Tests: Recent Labs  Lab 01/11/24 1243  AST 43*  ALT 32  ALKPHOS 91  BILITOT 1.0  PROT 7.3  ALBUMIN 3.9   No results for input(s): "LIPASE", "AMYLASE" in the last 168 hours. No results for input(s): "AMMONIA" in the last 168 hours. Coagulation Profile: Recent Labs  Lab 01/11/24 1243  INR 1.1   Cardiac Enzymes: No results for input(s): "CKTOTAL", "CKMB", "CKMBINDEX", "TROPONINI" in the last 168 hours. BNP (last 3 results) No results for input(s): "PROBNP" in the last 8760 hours. HbA1C: No results for input(s): "HGBA1C" in the last 72 hours. CBG: No results for input(s): "GLUCAP" in the last 168 hours. Lipid Profile: No results for input(s): "CHOL", "HDL", "LDLCALC", "TRIG", "CHOLHDL", "LDLDIRECT" in the  last 72 hours.  Thyroid Function Tests: Recent Labs    01/10/24 1959  TSH 3.969   Anemia Panel: No results for input(s): "VITAMINB12", "FOLATE", "FERRITIN", "TIBC", "IRON", "RETICCTPCT" in the last 72 hours. Urine analysis:    Component Value Date/Time   COLORURINE COLORLESS (A) 01/10/2024 1955   APPEARANCEUR CLEAR (A) 01/10/2024 1955   LABSPEC 1.005 01/10/2024 1955   PHURINE 8.0 01/10/2024 1955   GLUCOSEU NEGATIVE 01/10/2024 1955   HGBUR SMALL (A) 01/10/2024 1955   BILIRUBINUR NEGATIVE 01/10/2024 1955   KETONESUR NEGATIVE 01/10/2024 1955   PROTEINUR 30 (A) 01/10/2024 1955   NITRITE NEGATIVE 01/10/2024 1955   LEUKOCYTESUR NEGATIVE 01/10/2024 1955    Radiological Exams on Admission: DG Chest Portable 1 View Result Date: 01/11/2024 CLINICAL DATA:  Palpitations.  Chest radiograph dated 11/02/2023. EXAM: PORTABLE CHEST 1 VIEW COMPARISON:  Stable cardiomegaly.  Prior median sternotomy and CABG. FINDINGS: Patient's chin obscures evaluation of the bilateral paramedian lung apices. Stable cardiomegaly. Prior median sternotomy and CABG. Aortic atherosclerosis. Emphysematous changes again noted. No focal consolidation, pleural effusion, or pneumothorax. Left axillary surgical clips. No acute osseous abnormality. IMPRESSION: 1. No acute cardiopulmonary findings. 2. Stable cardiomegaly. 3. Emphysema. Electronically Signed   By: Hart Robinsons M.D.   On: 01/11/2024 15:06    EKG: Independently reviewed.  A-fib with RVR, no acute ST changes.  Assessment/Plan Principal Problem:   Afib (HCC) Active Problems:   Acute on chronic diastolic CHF (congestive heart failure) (HCC)   Paroxysmal A-fib (HCC)  (please populate well all problems here in Problem List. (For example, if patient is on BP meds at home and you resume or decide to hold them, it is a problem that needs to be her. Same for CAD, COPD, HLD and so on)  A-fib with RVR - A-fib probably chronic problems or paroxysmal as patient's  cardiologist note from February at least 1 episode of A-fib and previous EKG. - Given there is a concurrent uncontrolled hypertension, we will increase patient's Coreg dosage from 3.125 to 6.25 mg twice daily.  Plus as needed Lopressor for rate control - CHADS2=3, denied any history of intracranial bleeding or GI bleed, no anemia.  Will start patient on Eliquis. - Echocardiogram was done less than 6 months ago, will not repeat at this time. - Check TSH  Acute on chronic HFpEF decompensation Acute pulmonary hypertension decompensated - Continue IV diuresis 20 mg twice daily for 1-2 doses, does not probably can switch back to p.o. Lasix 3 times a week - BP can go as above  Elevated troponins History of CAD - Pattern of elevation of troponin is flat likely demand ischemia from CHF decompensation and rapid A-fib.  The patient has no chest pains and EKG showed no acute ST changes - LHC September last year showed stable CAD  COPD - Stable, no symptoms or signs of acute exacerbation - Avoid albuterol  PAC and frequent PVCs - Continue mexiletine - Coreg dosage increased  CKD stage IIIa - Volume overloaded, creatinine level stable, IV diuresis as above  Chronic thrombocytopenia - Platelet count stable, hemoglobin stable, denied any bleeding.  Deconditioning - PT evaluation  DVT prophylaxis: Eliquis Code Status: Full code Family Communication: Husband at bedside Disposition Plan: Expect less than 2 midnight hospital Consults called: None Admission status: PCU observation   Emeline General MD [facility] Pager [phone]  01/11/2024, 3:44 PM

## 2024-01-11 NOTE — Telephone Encounter (Signed)
 Called patient due to left emergency department before provider exam to inquire about condition and follow up plans.. I spoke to the patient, but she had a lot of difficulty hearing me.   I did get across that she needs to call her pcp today, and she understood.  Says she has to wait for husband to get home.  I tried to explain that her labs were abnormal, but I do not believe she comprehended that.  I did call the office of Olivia celland PA (PCP) and left message for sholanda to inform her of elevated troponin (although it appears to elevated historically.)

## 2024-01-12 ENCOUNTER — Telehealth (HOSPITAL_COMMUNITY): Payer: Self-pay | Admitting: Pharmacy Technician

## 2024-01-12 ENCOUNTER — Other Ambulatory Visit (HOSPITAL_COMMUNITY): Payer: Self-pay

## 2024-01-12 ENCOUNTER — Other Ambulatory Visit: Payer: Self-pay

## 2024-01-12 ENCOUNTER — Ambulatory Visit: Payer: PPO | Attending: Cardiology | Admitting: Cardiology

## 2024-01-12 ENCOUNTER — Emergency Department

## 2024-01-12 ENCOUNTER — Observation Stay

## 2024-01-12 ENCOUNTER — Inpatient Hospital Stay
Admission: EM | Admit: 2024-01-12 | Discharge: 2024-01-18 | DRG: 089 | Disposition: A | Discharge status: Skilled Nursing Facility | Attending: Internal Medicine | Admitting: Internal Medicine

## 2024-01-12 ENCOUNTER — Encounter: Payer: Self-pay | Admitting: Internal Medicine

## 2024-01-12 DIAGNOSIS — Z881 Allergy status to other antibiotic agents status: Secondary | ICD-10-CM

## 2024-01-12 DIAGNOSIS — R7989 Other specified abnormal findings of blood chemistry: Secondary | ICD-10-CM | POA: Insufficient documentation

## 2024-01-12 DIAGNOSIS — I5030 Unspecified diastolic (congestive) heart failure: Secondary | ICD-10-CM | POA: Diagnosis not present

## 2024-01-12 DIAGNOSIS — S0990XA Unspecified injury of head, initial encounter: Secondary | ICD-10-CM | POA: Diagnosis not present

## 2024-01-12 DIAGNOSIS — W19XXXA Unspecified fall, initial encounter: Principal | ICD-10-CM

## 2024-01-12 DIAGNOSIS — I130 Hypertensive heart and chronic kidney disease with heart failure and stage 1 through stage 4 chronic kidney disease, or unspecified chronic kidney disease: Secondary | ICD-10-CM | POA: Diagnosis present

## 2024-01-12 DIAGNOSIS — F0390 Unspecified dementia without behavioral disturbance: Secondary | ICD-10-CM | POA: Diagnosis present

## 2024-01-12 DIAGNOSIS — E860 Dehydration: Secondary | ICD-10-CM | POA: Diagnosis present

## 2024-01-12 DIAGNOSIS — K219 Gastro-esophageal reflux disease without esophagitis: Secondary | ICD-10-CM | POA: Diagnosis present

## 2024-01-12 DIAGNOSIS — S060X0A Concussion without loss of consciousness, initial encounter: Secondary | ICD-10-CM | POA: Diagnosis not present

## 2024-01-12 DIAGNOSIS — W19XXXS Unspecified fall, sequela: Secondary | ICD-10-CM | POA: Diagnosis not present

## 2024-01-12 DIAGNOSIS — I2510 Atherosclerotic heart disease of native coronary artery without angina pectoris: Secondary | ICD-10-CM | POA: Diagnosis not present

## 2024-01-12 DIAGNOSIS — Z885 Allergy status to narcotic agent status: Secondary | ICD-10-CM

## 2024-01-12 DIAGNOSIS — R531 Weakness: Secondary | ICD-10-CM | POA: Diagnosis not present

## 2024-01-12 DIAGNOSIS — Z8249 Family history of ischemic heart disease and other diseases of the circulatory system: Secondary | ICD-10-CM

## 2024-01-12 DIAGNOSIS — I480 Paroxysmal atrial fibrillation: Secondary | ICD-10-CM | POA: Diagnosis present

## 2024-01-12 DIAGNOSIS — I10 Essential (primary) hypertension: Secondary | ICD-10-CM | POA: Diagnosis not present

## 2024-01-12 DIAGNOSIS — Z853 Personal history of malignant neoplasm of breast: Secondary | ICD-10-CM

## 2024-01-12 DIAGNOSIS — I517 Cardiomegaly: Secondary | ICD-10-CM | POA: Diagnosis not present

## 2024-01-12 DIAGNOSIS — S199XXA Unspecified injury of neck, initial encounter: Secondary | ICD-10-CM | POA: Diagnosis not present

## 2024-01-12 DIAGNOSIS — I6782 Cerebral ischemia: Secondary | ICD-10-CM | POA: Diagnosis not present

## 2024-01-12 DIAGNOSIS — Z6821 Body mass index (BMI) 21.0-21.9, adult: Secondary | ICD-10-CM

## 2024-01-12 DIAGNOSIS — I2489 Other forms of acute ischemic heart disease: Secondary | ICD-10-CM | POA: Diagnosis present

## 2024-01-12 DIAGNOSIS — R402142 Coma scale, eyes open, spontaneous, at arrival to emergency department: Secondary | ICD-10-CM | POA: Diagnosis present

## 2024-01-12 DIAGNOSIS — Z7951 Long term (current) use of inhaled steroids: Secondary | ICD-10-CM

## 2024-01-12 DIAGNOSIS — G9340 Encephalopathy, unspecified: Secondary | ICD-10-CM | POA: Diagnosis not present

## 2024-01-12 DIAGNOSIS — M25572 Pain in left ankle and joints of left foot: Secondary | ICD-10-CM | POA: Diagnosis not present

## 2024-01-12 DIAGNOSIS — W010XXA Fall on same level from slipping, tripping and stumbling without subsequent striking against object, initial encounter: Secondary | ICD-10-CM | POA: Diagnosis present

## 2024-01-12 DIAGNOSIS — M25551 Pain in right hip: Secondary | ICD-10-CM | POA: Diagnosis not present

## 2024-01-12 DIAGNOSIS — I5032 Chronic diastolic (congestive) heart failure: Secondary | ICD-10-CM | POA: Insufficient documentation

## 2024-01-12 DIAGNOSIS — J439 Emphysema, unspecified: Secondary | ICD-10-CM | POA: Diagnosis present

## 2024-01-12 DIAGNOSIS — J449 Chronic obstructive pulmonary disease, unspecified: Secondary | ICD-10-CM | POA: Diagnosis not present

## 2024-01-12 DIAGNOSIS — R296 Repeated falls: Secondary | ICD-10-CM | POA: Diagnosis present

## 2024-01-12 DIAGNOSIS — Z87891 Personal history of nicotine dependence: Secondary | ICD-10-CM

## 2024-01-12 DIAGNOSIS — I509 Heart failure, unspecified: Secondary | ICD-10-CM | POA: Diagnosis not present

## 2024-01-12 DIAGNOSIS — Z79899 Other long term (current) drug therapy: Secondary | ICD-10-CM

## 2024-01-12 DIAGNOSIS — R402362 Coma scale, best motor response, obeys commands, at arrival to emergency department: Secondary | ICD-10-CM | POA: Diagnosis present

## 2024-01-12 DIAGNOSIS — S3993XA Unspecified injury of pelvis, initial encounter: Secondary | ICD-10-CM | POA: Diagnosis not present

## 2024-01-12 DIAGNOSIS — N1831 Chronic kidney disease, stage 3a: Secondary | ICD-10-CM | POA: Diagnosis not present

## 2024-01-12 DIAGNOSIS — Z951 Presence of aortocoronary bypass graft: Secondary | ICD-10-CM

## 2024-01-12 DIAGNOSIS — E785 Hyperlipidemia, unspecified: Secondary | ICD-10-CM | POA: Diagnosis present

## 2024-01-12 DIAGNOSIS — I4891 Unspecified atrial fibrillation: Secondary | ICD-10-CM | POA: Diagnosis present

## 2024-01-12 DIAGNOSIS — Z825 Family history of asthma and other chronic lower respiratory diseases: Secondary | ICD-10-CM

## 2024-01-12 DIAGNOSIS — Z888 Allergy status to other drugs, medicaments and biological substances status: Secondary | ICD-10-CM

## 2024-01-12 DIAGNOSIS — Z800 Family history of malignant neoplasm of digestive organs: Secondary | ICD-10-CM

## 2024-01-12 DIAGNOSIS — K5730 Diverticulosis of large intestine without perforation or abscess without bleeding: Secondary | ICD-10-CM | POA: Diagnosis not present

## 2024-01-12 DIAGNOSIS — Z96641 Presence of right artificial hip joint: Secondary | ICD-10-CM | POA: Diagnosis present

## 2024-01-12 DIAGNOSIS — R402252 Coma scale, best verbal response, oriented, at arrival to emergency department: Secondary | ICD-10-CM | POA: Diagnosis present

## 2024-01-12 DIAGNOSIS — R64 Cachexia: Secondary | ICD-10-CM | POA: Diagnosis present

## 2024-01-12 DIAGNOSIS — G9341 Metabolic encephalopathy: Secondary | ICD-10-CM | POA: Diagnosis present

## 2024-01-12 LAB — CBC
HCT: 47.5 % — ABNORMAL HIGH (ref 36.0–46.0)
Hemoglobin: 15.4 g/dL — ABNORMAL HIGH (ref 12.0–15.0)
MCH: 32.9 pg (ref 26.0–34.0)
MCHC: 32.4 g/dL (ref 30.0–36.0)
MCV: 101.5 fL — ABNORMAL HIGH (ref 80.0–100.0)
Platelets: 167 10*3/uL (ref 150–400)
RBC: 4.68 MIL/uL (ref 3.87–5.11)
RDW: 13.8 % (ref 11.5–15.5)
WBC: 5.1 10*3/uL (ref 4.0–10.5)
nRBC: 0 % (ref 0.0–0.2)

## 2024-01-12 LAB — COMPREHENSIVE METABOLIC PANEL WITH GFR
ALT: 28 U/L (ref 0–44)
AST: 45 U/L — ABNORMAL HIGH (ref 15–41)
Albumin: 3.7 g/dL (ref 3.5–5.0)
Alkaline Phosphatase: 100 U/L (ref 38–126)
Anion gap: 12 (ref 5–15)
BUN: 28 mg/dL — ABNORMAL HIGH (ref 8–23)
CO2: 23 mmol/L (ref 22–32)
Calcium: 10.4 mg/dL — ABNORMAL HIGH (ref 8.9–10.3)
Chloride: 102 mmol/L (ref 98–111)
Creatinine, Ser: 1.03 mg/dL — ABNORMAL HIGH (ref 0.44–1.00)
GFR, Estimated: 54 mL/min — ABNORMAL LOW (ref >60)
Glucose, Bld: 115 mg/dL — ABNORMAL HIGH (ref 70–99)
Potassium: 4.4 mmol/L (ref 3.5–5.1)
Sodium: 137 mmol/L (ref 135–145)
Total Bilirubin: 1 mg/dL (ref 0.0–1.2)
Total Protein: 6.9 g/dL (ref 6.5–8.1)

## 2024-01-12 LAB — BASIC METABOLIC PANEL WITH GFR
Anion gap: 9 (ref 5–15)
BUN: 20 mg/dL (ref 8–23)
CO2: 29 mmol/L (ref 22–32)
Calcium: 10.3 mg/dL (ref 8.9–10.3)
Chloride: 101 mmol/L (ref 98–111)
Creatinine, Ser: 0.81 mg/dL (ref 0.44–1.00)
GFR, Estimated: >60 mL/min (ref >60)
Glucose, Bld: 84 mg/dL (ref 70–99)
Potassium: 3.2 mmol/L — ABNORMAL LOW (ref 3.5–5.1)
Sodium: 139 mmol/L (ref 135–145)

## 2024-01-12 LAB — URINALYSIS, COMPLETE (UACMP) WITH MICROSCOPIC
Bilirubin Urine: NEGATIVE
Glucose, UA: NEGATIVE mg/dL
Hgb urine dipstick: NEGATIVE
Ketones, ur: NEGATIVE mg/dL
Leukocytes,Ua: NEGATIVE
Nitrite: NEGATIVE
Protein, ur: 30 mg/dL — AB
Specific Gravity, Urine: 1.013 (ref 1.005–1.030)
pH: 6 (ref 5.0–8.0)

## 2024-01-12 LAB — TROPONIN I (HIGH SENSITIVITY)
Troponin I (High Sensitivity): 101 ng/L (ref <18)
Troponin I (High Sensitivity): 70 ng/L — ABNORMAL HIGH (ref <18)

## 2024-01-12 MED ORDER — CARVEDILOL 12.5 MG PO TABS: 12.5000 mg | ORAL_TABLET | Freq: Two times a day (BID) | ORAL | 1 refills | Status: DC

## 2024-01-12 MED ORDER — POTASSIUM CHLORIDE CRYS ER 20 MEQ PO TBCR
40.0000 meq | EXTENDED_RELEASE_TABLET | Freq: Once | ORAL | Status: AC
  Administered 2024-01-12: 40 meq via ORAL
  Filled 2024-01-12: qty 2

## 2024-01-12 MED ORDER — FENTANYL CITRATE PF 50 MCG/ML IJ SOSY
50.0000 ug | PREFILLED_SYRINGE | Freq: Once | INTRAMUSCULAR | Status: AC
  Administered 2024-01-12: 50 ug via INTRAVENOUS
  Filled 2024-01-12: qty 1

## 2024-01-12 MED ORDER — HYDRALAZINE HCL 20 MG/ML IJ SOLN
10.0000 mg | Freq: Four times a day (QID) | INTRAMUSCULAR | Status: DC | PRN
  Administered 2024-01-12: 10 mg via INTRAVENOUS
  Filled 2024-01-12: qty 1

## 2024-01-12 MED ORDER — CARVEDILOL 6.25 MG PO TABS
12.5000 mg | ORAL_TABLET | Freq: Two times a day (BID) | ORAL | Status: DC
  Administered 2024-01-12: 12.5 mg via ORAL
  Filled 2024-01-12: qty 2

## 2024-01-12 MED ORDER — APIXABAN 2.5 MG PO TABS
2.5000 mg | ORAL_TABLET | Freq: Two times a day (BID) | ORAL | 1 refills | Status: DC
Start: 2024-01-12 — End: 2024-01-18

## 2024-01-12 NOTE — ED Notes (Signed)
 Fall precautions in place for Pt. This RN placed fall band, fall grip socks, bed alarm and fall sign.

## 2024-01-12 NOTE — Evaluation (Signed)
 Physical Therapy Evaluation Patient Details Name: Tiffany Caldwell MRN: [id number] DOB: 03-Aug-1941 Today's Date: 01/12/2024  History of Present Illness  Pt is an 83 yo female that presented to ED for weakness, exertional dyspnea. Workup for afib with RVR, PMH of COPD, CAD, chronic HFpEF, HCOM, CKD, chronic thrombocytopenia, pulmonary MAI, IBS, PAC, breast cancer.  Clinical Impression  Patient alert, agreeable to PT, denied pain. Pt oriented x4, but did display some perseveration/confusion with lines/leads/clothing management. Pt reported she lives at [facility], handicap accessible bathroom, lives with her husband and ambulatory with her rollator. Supine <> sit CGA, use of bed rails. Sit <> stand and step pivot with RW and CGA, decreased to supervision for lines/leads. She was able to perform pericare modI in sitting, and supervision for doffing/donning mesh underwear. She ambulated ~30ft but fatigued, HR in 120s, CGA for safety.  Overall the patient demonstrated deficits (see "PT Problem List") that impede the patient's functional abilities, safety, and mobility and would benefit from skilled PT intervention.          If plan is discharge home, recommend the following: A little help with walking and/or transfers;A little help with bathing/dressing/bathroom;Assistance with cooking/housework;Assist for transportation;Help with stairs or ramp for entrance;Direct supervision/assist for medications management   Can travel by private vehicle        Equipment Recommendations None recommended by PT  Recommendations for Other Services       Functional Status Assessment Patient has had a recent decline in their functional status and demonstrates the ability to make significant improvements in function in a reasonable and predictable amount of time.     Precautions / Restrictions Precautions Precautions: Fall Recall of Precautions/Restrictions: Intact Restrictions Weight Bearing Restrictions  Per Provider Order: No      Mobility  Bed Mobility Overal bed mobility: Needs Assistance Bed Mobility: Supine to Sit, Sit to Supine     Supine to sit: Supervision Sit to supine: Supervision        Transfers Overall transfer level: Needs assistance Equipment used: Rolling walker (2 wheels) Transfers: Sit to/from Stand, Bed to chair/wheelchair/BSC Sit to Stand: Contact guard assist   Step pivot transfers: Contact guard assist, Supervision       General transfer comment: assist for lines/leads, clothing management    Ambulation/Gait Ambulation/Gait assistance: Contact guard assist Gait Distance (Feet): 40 Feet Assistive device: Rolling walker (2 wheels)   Gait velocity: decreased     General Gait Details: pt endorsed fatigue, HR in 120s with activities  Stairs            Wheelchair Mobility     Tilt Bed    Modified Rankin (Stroke Patients Only)       Balance Overall balance assessment: Needs assistance Sitting-balance support: Feet supported Sitting balance-Leahy Scale: Good Sitting balance - Comments: pericare in sitting   Standing balance support: Reliant on assistive device for balance Standing balance-Leahy Scale: Fair                               Pertinent Vitals/Pain Pain Assessment Pain Assessment: No/denies pain    Home Living Family/patient expects to be discharged to:: Private residence Living Arrangements: Spouse/significant other   Type of Home: Independent living facility Home Access: Level entry (1 step from curb)       Home Layout: One level Home Equipment: Rollator (4 wheels);Grab bars - tub/shower;Grab bars - toilet;Cane - single point  Prior Function Prior Level of Function : Independent/Modified Independent;History of Falls (last six months)             Mobility Comments: Rollator ADLs Comments: indep in some IADL, hired assist for food drop off/ often door dash     Extremity/Trunk  Assessment   Upper Extremity Assessment Upper Extremity Assessment: Defer to OT evaluation    Lower Extremity Assessment Lower Extremity Assessment: Generalized weakness    Cervical / Trunk Assessment Cervical / Trunk Assessment: Kyphotic  Communication   Communication Communication: Impaired Factors Affecting Communication: Hearing impaired    Cognition Arousal: Alert Behavior During Therapy: WFL for tasks assessed/performed   PT - Cognitive impairments: No apparent impairments                       PT - Cognition Comments: pt oriented x4 but did display some difficulty managing clothes, perseverated on lines/leads Following commands: Intact       Cueing       General Comments General comments (skin integrity, edema, etc.): Significant kyphotic posture noted    Exercises     Assessment/Plan    PT Assessment Patient needs continued PT services  PT Problem List Decreased strength;Decreased activity tolerance;Decreased balance;Decreased mobility;Decreased coordination       PT Treatment Interventions DME instruction;Neuromuscular re-education;Stair training;Gait training;Patient/family education;Therapeutic activities;Functional mobility training;Therapeutic exercise;Balance training    PT Goals (Current goals can be found in the Care Plan section)  Acute Rehab PT Goals Patient Stated Goal: to go home PT Goal Formulation: With patient Time For Goal Achievement: 01/26/24 Potential to Achieve Goals: Good    Frequency Min 2X/week     Co-evaluation               AM-PAC PT "6 Clicks" Mobility  Outcome Measure Help needed turning from your back to your side while in a flat bed without using bedrails?: A Little Help needed moving from lying on your back to sitting on the side of a flat bed without using bedrails?: A Little Help needed moving to and from a bed to a chair (including a wheelchair)?: A Little Help needed standing up from a chair using  your arms (e.g., wheelchair or bedside chair)?: A Little Help needed to walk in hospital room?: A Little Help needed climbing 3-5 steps with a railing? : A Little 6 Click Score: 18    End of Session Equipment Utilized During Treatment: Gait belt Activity Tolerance: Patient tolerated treatment well Patient left: in bed;with call bell/phone within reach;with bed alarm set Nurse Communication: Mobility status PT Visit Diagnosis: Other abnormalities of gait and mobility (R26.89);Difficulty in walking, not elsewhere classified (R26.2);Muscle weakness (generalized) (M62.81)    Time: 4098-1191 PT Time Calculation (min) (ACUTE ONLY): 19 min   Charges:   PT Evaluation $PT Eval Low Complexity: 1 Low PT Treatments $Therapeutic Activity: 8-22 mins PT General Charges $$ ACUTE PT VISIT: 1 Visit         Olga Coaster PT, DPT 2:51 PM,01/12/24

## 2024-01-12 NOTE — Discharge Summary (Signed)
 Physician Discharge Summary  IVEY NEMBHARD Caldwell:[id number] DOB: September 02, 1941 DOA: 01/11/2024  PCP: Delma Officer, PA  Admit date: 01/11/2024 Discharge date: 01/12/2024  Admitted From: Home Disposition:  Home  Recommendations for Outpatient Follow-up:  Follow up with PCP in 1-2 weeks Follow up with cardiology 2 weeks  Home Health:No  Equipment/Devices:None   Discharge Condition:Stable  CODE STATUS:FULL  Diet recommendation: Heart healthy  Brief/Interim Summary:  HPI: Tiffany Caldwell is a 83 y.o. female with medical history significant of COPD, CAD, chronic HFpEF, HCOM, CKD stage IIIa, chronic thrombocytopenia, pulmonary MAI, IBS, PVC/PAC, breast cancer presented with worsening of generalized weakness and exertional dyspnea.   Symptoms started 2 days ago, patient started to develop generalized weakness and exertional dyspnea denied any chest pain no cough no fever or chills.  She has a history of CHF and takes Lasix Monday Wednesday Friday but denied any increasing peripheral edema.  She does admitted that her blood pressure has been running high.  She has been compliant with her medications.   ED Course: Afebrile, rapid A-fib heart rate ranging from 60-1 20, blood pressure SBP 170-180.  Chest x-ray negative for acute infiltrates, troponin 91> 98.  Blood work showed K3.9 bicarb 28 creatinine 0.9 WBC 5.6 hemoglobin 15 platelet 136.  ED.  After start patient on Cardizem bolus and drip the patient came bradycardic and Cardizem discontinued.    Discharge Diagnoses:  Principal Problem:   Afib (HCC) Active Problems:   Acute on chronic diastolic CHF (congestive heart failure) (HCC)   Paroxysmal A-fib (HCC)   A-fib with RVR Suspect paroxysmal.  Cardiologist note from February indicates at least 1 as well as of atrial fibrillation.  Increased dose of Coreg to 12.5 mg twice daily.  Stop Lopressor.  [place] Vascor is at least 3.  Will meet criteria for therapeutic anticoagulation.  Start  patient on Eliquis 2.5 mg twice daily given advanced age.  Echocardiogram less than 6 months old.  Did not repeat.  Stable for discharge.  Follow-up outpatient PCP and cardiology.    Acute on chronic HFpEF decompensation Acute pulmonary hypertension decompensated Exacerbation was relatively mild.  Received IV Lasix while in house.  Can resume previous home diuretic regimen.   Elevated troponins History of CAD No significant delta to suggest ACS.  Left heart cath last September showed stable CAD.  No chest pain at time of discharge.  Follow-up outpatient.  Discharge Instructions  Discharge Instructions     Diet - low sodium heart healthy   Complete by: As directed    Increase activity slowly   Complete by: As directed       Allergies as of 01/12/2024       Reactions   Amlodipine Swelling   Leg swelling and fatigue.   Hydromorphone Other (See Comments)   ALTERED MENTAL STATUS Altered mental status   Azithromycin Rash   Ciprofloxacin Nausea Only, Nausea And Vomiting   Hydromorphone Hcl Other (See Comments)   Altered mental status Altered mental status   Levofloxacin Nausea Only, Other (See Comments)   Lisinopril Other (See Comments)   Fatigue Fatigue        Medication List     STOP taking these medications    aspirin EC 81 MG tablet   metoprolol succinate 25 MG 24 hr tablet Commonly known as: TOPROL-XL       TAKE these medications    acetaminophen 500 MG tablet Commonly known as: TYLENOL Take 1,000 mg by mouth 2 (two) times daily.  albuterol 108 (90 Base) MCG/ACT inhaler Commonly known as: VENTOLIN HFA INHALE 1-2 PUFFS BY MOUTH EVERY 6 HOURS AS NEEDED FOR WHEEZING OR SHORTNESS OF BREATH What changed: See the new instructions.   Anoro Ellipta 62.5-25 MCG/ACT Aepb Generic drug: umeclidinium-vilanterol INHALE 1 PUFF INTO THE LUNGS BY MOUTH ONCE DAILY   apixaban 2.5 MG Tabs tablet Commonly known as: ELIQUIS Take 1 tablet (2.5 mg total) by mouth 2 (two)  times daily.   atorvastatin 20 MG tablet Commonly known as: LIPITOR Take 1 tablet (20 mg total) by mouth daily.   Biotin 16109 MCG Tabs Take 1 tablet by mouth daily.   carvedilol 12.5 MG tablet Commonly known as: COREG Take 1 tablet (12.5 mg total) by mouth 2 (two) times daily with a meal. What changed:  medication strength how much to take   feeding supplement Liqd Take 237 mLs by mouth 2 (two) times daily between meals.   fexofenadine 180 MG tablet Commonly known as: ALLEGRA Take 180 mg by mouth daily.   fish oil-omega-3 fatty acids 1000 MG capsule Take 1 g by mouth daily.   furosemide 20 MG tablet Commonly known as: Lasix Take 1 tablet (20 mg total) by mouth every other day. Take 1 tablet 3 times a week (Monday, Wednesday and Friday) as needed.   gabapentin 300 MG capsule Commonly known as: NEURONTIN Take 300 mg by mouth 4 (four) times daily.   GLUCOSAMINE CHONDR 1500 COMPLX PO Take 1 tablet by mouth once.   mexiletine 150 MG capsule Commonly known as: MEXITIL Take 1 capsule (150 mg total) by mouth 2 (two) times daily.   multivitamin tablet Take 1 tablet by mouth daily.   nitroGLYCERIN 0.4 MG SL tablet Commonly known as: NITROSTAT Place 0.4 mg under the tongue every 5 (five) minutes as needed for chest pain.   potassium chloride 10 MEQ tablet Commonly known as: KLOR-CON M Take 1 tablet (10 mEq total) by mouth every other day.        Follow-up Information     Delma Officer, [place]. Schedule an appointment as soon as possible for a visit in 1 week(s).   Specialty: Internal Medicine Contact information: [address] [place] [address] [phone]         Yvonne Kendall, MD. Schedule an appointment as soon as possible for a visit in 2 week(s).   Specialty: Cardiology Contact information: [address] [place] [address] [phone]                Allergies  Allergen Reactions   Amlodipine  Swelling    Leg swelling and fatigue.   Hydromorphone Other (See Comments)    ALTERED MENTAL STATUS Altered mental status   Azithromycin Rash   Ciprofloxacin Nausea Only and Nausea And Vomiting   Hydromorphone Hcl Other (See Comments)    Altered mental status Altered mental status   Levofloxacin Nausea Only and Other (See Comments)   Lisinopril Other (See Comments)    Fatigue  Fatigue    Consultations: None   Procedures/Studies: DG Chest 1 View Result Date: 01/12/2024 CLINICAL DATA:  Congestive heart failure. EXAM: CHEST  1 VIEW COMPARISON:  01/11/2024 FINDINGS: Status post median sternotomy and CABG procedure. Stable cardiac enlargement. No pleural effusion identified. No frank interstitial edema. Advanced changes of COPD/emphysema with upper lobe predominant scarring and architectural distortion. Chronic upper lobe predominant scarring. No acute osseous findings. Surgical clips noted in the left axilla. IMPRESSION: 1. No acute findings.  2. Advanced changes of COPD/emphysema. Electronically Signed   By: Signa Kell M.D.   On: 01/12/2024 08:23   DG Chest Portable 1 View Result Date: 01/11/2024 CLINICAL DATA:  Palpitations.  Chest radiograph dated 11/02/2023. EXAM: PORTABLE CHEST 1 VIEW COMPARISON:  Stable cardiomegaly.  Prior median sternotomy and CABG. FINDINGS: Patient's chin obscures evaluation of the bilateral paramedian lung apices. Stable cardiomegaly. Prior median sternotomy and CABG. Aortic atherosclerosis. Emphysematous changes again noted. No focal consolidation, pleural effusion, or pneumothorax. Left axillary surgical clips. No acute osseous abnormality. IMPRESSION: 1. No acute cardiopulmonary findings. 2. Stable cardiomegaly. 3. Emphysema. Electronically Signed   By: Hart Robinsons M.D.   On: 01/11/2024 15:06      Subjective: Seen and examined on the day of discharge.  Stable, no distress.  Husband at bedside.  Appropriate discharge home.  Discharge Exam: Vitals:    01/12/24 0807 01/12/24 1455  BP: (!) 145/67 125/66  Pulse:  97  Resp:    Temp:    SpO2:     Vitals:   01/12/24 0730 01/12/24 0753 01/12/24 0807 01/12/24 1455  BP: (!) 188/79 (!) 188/79 (!) 145/67 125/66  Pulse: 61   97  Resp:      Temp:      TempSrc:      SpO2: 100%     Weight:      Height:        General: Pt is alert, awake, not in acute distress Cardiovascular: RRR, S1/S2 +, no rubs, no gallops Respiratory: CTA bilaterally, no wheezing, no rhonchi Abdominal: Soft, NT, ND, bowel sounds + Extremities: no edema, no cyanosis    The results of significant diagnostics from this hospitalization (including imaging, microbiology, ancillary and laboratory) are listed below for reference.     Microbiology: Recent Results (from the past 240 hours)  Resp panel by RT-PCR (RSV, Flu A&B, Covid) Anterior Nasal Swab     Status: None   Collection Time: 01/11/24  1:31 PM   Specimen: Anterior Nasal Swab  Result Value Ref Range Status   SARS Coronavirus 2 by RT PCR NEGATIVE NEGATIVE Final    Comment: (NOTE) SARS-CoV-2 target nucleic acids are NOT DETECTED.  The SARS-CoV-2 RNA is generally detectable in upper respiratory specimens during the acute phase of infection. The lowest concentration of SARS-CoV-2 viral copies this assay can detect is 138 copies/mL. A negative result does not preclude SARS-Cov-2 infection and should not be used as the sole basis for treatment or other patient management decisions. A negative result may occur with  improper specimen collection/handling, submission of specimen other than nasopharyngeal swab, presence of viral mutation(s) within the areas targeted by this assay, and inadequate number of viral copies(<138 copies/mL). A negative result must be combined with clinical observations, patient history, and epidemiological information. The expected result is Negative.  Fact Sheet for Patients:  BloggerCourse.com  Fact Sheet for  Healthcare Providers:  SeriousBroker.it  This test is no t yet approved or cleared by the [place] FDA and  has been authorized for detection and/or diagnosis of SARS-CoV-2 by FDA under an Emergency Use Authorization (EUA). This EUA will remain  in effect (meaning this test can be used) for the duration of the COVID-19 declaration under Section 564(b)(1) of the Act, 21 U.S.C.section 360bbb-3(b)(1), unless the authorization is terminated  or revoked sooner.       Influenza A by PCR NEGATIVE NEGATIVE Final   Influenza B by PCR NEGATIVE NEGATIVE Final    Comment: (NOTE) The Xpert Xpress  SARS-CoV-2/FLU/RSV plus assay is intended as an aid in the diagnosis of influenza from Nasopharyngeal swab specimens and should not be used as a sole basis for treatment. Nasal washings and aspirates are unacceptable for Xpert Xpress SARS-CoV-2/FLU/RSV testing.  Fact Sheet for Patients: BloggerCourse.com  Fact Sheet for Healthcare Providers: SeriousBroker.it  This test is not yet approved or cleared by the [place] FDA and has been authorized for detection and/or diagnosis of SARS-CoV-2 by FDA under an Emergency Use Authorization (EUA). This EUA will remain in effect (meaning this test can be used) for the duration of the COVID-19 declaration under Section 564(b)(1) of the Act, 21 U.S.C. section 360bbb-3(b)(1), unless the authorization is terminated or revoked.     Resp Syncytial Virus by PCR NEGATIVE NEGATIVE Final    Comment: (NOTE) Fact Sheet for Patients: BloggerCourse.com  Fact Sheet for Healthcare Providers: SeriousBroker.it  This test is not yet approved or cleared by the [place] FDA and has been authorized for detection and/or diagnosis of SARS-CoV-2 by FDA under an Emergency Use Authorization (EUA). This EUA will remain in effect (meaning this  test can be used) for the duration of the COVID-19 declaration under Section 564(b)(1) of the Act, 21 U.S.C. section 360bbb-3(b)(1), unless the authorization is terminated or revoked.  Performed at [facility], [address] [place], [place] [address]      Labs: BNP (last 3 results) Recent Labs    11/05/23 1455 01/11/24 1243  BNP 855.4* 876.8*   Basic Metabolic Panel: Recent Labs  Lab 01/10/24 1959 01/11/24 1243 01/12/24 0600  NA 138 138 139  K 3.6 3.9 3.2*  CL 102 101 101  CO2 27 28 29   GLUCOSE 93 98 84  BUN 19 20 20   CREATININE 0.71 0.91 0.81  CALCIUM 10.3 10.8* 10.3  MG 2.1 2.0  --   PHOS  --  3.1  --    Liver Function Tests: Recent Labs  Lab 01/11/24 1243  AST 43*  ALT 32  ALKPHOS 91  BILITOT 1.0  PROT 7.3  ALBUMIN 3.9   No results for input(s): "LIPASE", "AMYLASE" in the last 168 hours. No results for input(s): "AMMONIA" in the last 168 hours. CBC: Recent Labs  Lab 01/10/24 1959 01/11/24 1243  WBC 5.2 5.6  NEUTROABS  --  4.1  HGB 15.0 15.0  HCT 44.9 44.7  MCV 98.7 99.8  PLT 147* 136*   Cardiac Enzymes: No results for input(s): "CKTOTAL", "CKMB", "CKMBINDEX", "TROPONINI" in the last 168 hours. BNP: Invalid input(s): "POCBNP" CBG: No results for input(s): "GLUCAP" in the last 168 hours. D-Dimer No results for input(s): "DDIMER" in the last 72 hours. Hgb A1c No results for input(s): "HGBA1C" in the last 72 hours. Lipid Profile No results for input(s): "CHOL", "HDL", "LDLCALC", "TRIG", "CHOLHDL", "LDLDIRECT" in the last 72 hours. Thyroid function studies Recent Labs    01/11/24 1243  TSH 5.503*   Anemia work up No results for input(s): "VITAMINB12", "FOLATE", "FERRITIN", "TIBC", "IRON", "RETICCTPCT" in the last 72 hours. Urinalysis    Component Value Date/Time   COLORURINE COLORLESS (A) 01/10/2024 1955   APPEARANCEUR CLEAR (A) 01/10/2024 1955   LABSPEC 1.005 01/10/2024 1955   PHURINE 8.0 01/10/2024 1955   GLUCOSEU  NEGATIVE 01/10/2024 1955   HGBUR SMALL (A) 01/10/2024 1955   BILIRUBINUR NEGATIVE 01/10/2024 1955   KETONESUR NEGATIVE 01/10/2024 1955   PROTEINUR 30 (A) 01/10/2024 1955   NITRITE NEGATIVE 01/10/2024 1955   LEUKOCYTESUR NEGATIVE 01/10/2024 1955   Sepsis Labs Recent  Labs  Lab 01/10/24 1959 01/11/24 1243  WBC 5.2 5.6   Microbiology Recent Results (from the past 240 hours)  Resp panel by RT-PCR (RSV, Flu A&B, Covid) Anterior Nasal Swab     Status: None   Collection Time: 01/11/24  1:31 PM   Specimen: Anterior Nasal Swab  Result Value Ref Range Status   SARS Coronavirus 2 by RT PCR NEGATIVE NEGATIVE Final    Comment: (NOTE) SARS-CoV-2 target nucleic acids are NOT DETECTED.  The SARS-CoV-2 RNA is generally detectable in upper respiratory specimens during the acute phase of infection. The lowest concentration of SARS-CoV-2 viral copies this assay can detect is 138 copies/mL. A negative result does not preclude SARS-Cov-2 infection and should not be used as the sole basis for treatment or other patient management decisions. A negative result may occur with  improper specimen collection/handling, submission of specimen other than nasopharyngeal swab, presence of viral mutation(s) within the areas targeted by this assay, and inadequate number of viral copies(<138 copies/mL). A negative result must be combined with clinical observations, patient history, and epidemiological information. The expected result is Negative.  Fact Sheet for Patients:  BloggerCourse.com  Fact Sheet for Healthcare Providers:  SeriousBroker.it  This test is no t yet approved or cleared by the [place] FDA and  has been authorized for detection and/or diagnosis of SARS-CoV-2 by FDA under an Emergency Use Authorization (EUA). This EUA will remain  in effect (meaning this test can be used) for the duration of the COVID-19 declaration under Section  564(b)(1) of the Act, 21 U.S.C.section 360bbb-3(b)(1), unless the authorization is terminated  or revoked sooner.       Influenza A by PCR NEGATIVE NEGATIVE Final   Influenza B by PCR NEGATIVE NEGATIVE Final    Comment: (NOTE) The Xpert Xpress SARS-CoV-2/FLU/RSV plus assay is intended as an aid in the diagnosis of influenza from Nasopharyngeal swab specimens and should not be used as a sole basis for treatment. Nasal washings and aspirates are unacceptable for Xpert Xpress SARS-CoV-2/FLU/RSV testing.  Fact Sheet for Patients: BloggerCourse.com  Fact Sheet for Healthcare Providers: SeriousBroker.it  This test is not yet approved or cleared by the [place] FDA and has been authorized for detection and/or diagnosis of SARS-CoV-2 by FDA under an Emergency Use Authorization (EUA). This EUA will remain in effect (meaning this test can be used) for the duration of the COVID-19 declaration under Section 564(b)(1) of the Act, 21 U.S.C. section 360bbb-3(b)(1), unless the authorization is terminated or revoked.     Resp Syncytial Virus by PCR NEGATIVE NEGATIVE Final    Comment: (NOTE) Fact Sheet for Patients: BloggerCourse.com  Fact Sheet for Healthcare Providers: SeriousBroker.it  This test is not yet approved or cleared by the [place] FDA and has been authorized for detection and/or diagnosis of SARS-CoV-2 by FDA under an Emergency Use Authorization (EUA). This EUA will remain in effect (meaning this test can be used) for the duration of the COVID-19 declaration under Section 564(b)(1) of the Act, 21 U.S.C. section 360bbb-3(b)(1), unless the authorization is terminated or revoked.  Performed at [facility] [address] [place], [place] [address]      Time coordinating discharge: Over 30 minutes  SIGNED:   Tresa Moore, MD  [facility] 01/12/2024, 2:59 PM Pager   If 7PM-7AM, please contact night-coverage

## 2024-01-12 NOTE — TOC Initial Note (Addendum)
 Transition of Care [facility]) - Initial/Assessment Note    Patient Details  Name: Tiffany Caldwell MRN: [id number] Date of Birth: September 09, 1941  Transition of Care [facility]) CM/SW Contact:    Colin Broach, LCSW Phone Number: 01/12/2024, 3:07 PM  Clinical Narrative:                 CSW met with patient and spouse, Tiffany Caldwell, at bedside.  CSW introduced self and reason for visit.  Initial assessment completed. She is a CHF patient and is being recommended for HHPT/OT.  Patient resides at [facility].  CSW spoke with Sue Lush at [facility] and she only needs the outpatient PT/OT orders and the [facility] services will be provided in the home.  Patient's PCP is Legrand Rams, [place] and [facility].  Patient lives with her husband.  She states that she has a rollator at home that she uses to get around.  Patient declined the DME recommended. No other TOC needs at this time.  Expected Discharge Plan: Home w Home Health Services Barriers to Discharge: No Barriers Identified   Patient Goals and CMS Choice            Expected Discharge Plan and Services       Living arrangements for the past 2 months: Independent Living Facility Expected Discharge Date: 01/12/24                         HH Arranged: PT, OT       Representative spoke with at [facility] Agency: Sue Lush at [facility] needs Outpatient PT/OT orders only  Prior Living Arrangements/Services Living arrangements for the past 2 months: Independent Living Facility Lives with:: Spouse Patient language and need for interpreter reviewed:: Yes Do you feel safe going back to the place where you live?: Yes      Need for Family Participation in Patient Care: Yes (Comment) Care giver support system in place?: Yes (comment)   Criminal Activity/Legal Involvement Pertinent to Current Situation/Hospitalization: No - Comment as needed  Activities of Daily Living   ADL Screening (condition at time of  admission) Independently performs ADLs?: Yes (appropriate for developmental age) Is the patient deaf or have difficulty hearing?: No Does the patient have difficulty seeing, even when wearing glasses/contacts?: No Does the patient have difficulty concentrating, remembering, or making decisions?: No  Permission Sought/Granted                  Emotional Assessment Appearance:: Appears stated age Attitude/Demeanor/Rapport: Gracious Affect (typically observed): Appropriate Orientation: : Oriented to Self, Oriented to Place, Oriented to  Time, Oriented to Situation Alcohol / Substance Use: Not Applicable Psych Involvement: No (comment)  Admission diagnosis:  Afib (HCC) [I48.91] Patient Active Problem List   Diagnosis Date Noted   Paroxysmal A-fib (HCC) 01/11/2024   Afib (HCC) 01/11/2024   Acute on chronic respiratory failure with hypoxia (HCC) 11/07/2023   SIRS (systemic inflammatory response syndrome) (HCC) 11/06/2023   Myocardial injury 11/06/2023   Frequent PVCs 11/05/2023   Acute on chronic diastolic CHF (congestive heart failure) (HCC) 11/05/2023   Chronic kidney disease, stage 3a (HCC) 11/05/2023   Thrombocytopenia (HCC) 11/05/2023   Gallstone 11/05/2023   Acute metabolic encephalopathy 11/05/2023   Pulmonary hypertension (HCC) 06/17/2023   PVC (premature ventricular contraction) 06/06/2021   Changing skin lesion 10/19/2018   PAC (premature atrial contraction) 09/10/2018   Pinched nerve    Neuropathy  Lung disorder    Irritable bowel syndrome (IBS)    Hyperlipidemia LDL goal <70    GERD (gastroesophageal reflux disease)    Emphysema lung (HCC)    Dyspnea on exertion    DDD (degenerative disc disease), lumbosacral    Breast cancer (HCC)    Arthritis    Allergy    Labile hypertension    Fever of unknown origin    Hypoxemia    Somnolence    Primary localized osteoarthritis of right hip 08/05/2016   Primary osteoarthritis of right hip 08/05/2016   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 [place] [address] Phone: [phone] Fax: [phone]     Social Drivers of Health (SDOH) Social History: SDOH Screenings   Food Insecurity: No Food Insecurity (01/12/2024)  Housing: Low Risk  (01/12/2024)  Transportation Needs: No Transportation Needs (01/12/2024)  Utilities: Not At Risk (01/12/2024)  Social Connections: Socially Integrated (01/12/2024)  Tobacco Use: Medium Risk (01/12/2024)   SDOH Interventions:     Readmission Risk Interventions     No data to display

## 2024-01-12 NOTE — ED Notes (Signed)
 Pt transported to CT at this time.

## 2024-01-12 NOTE — ED Notes (Signed)
Pt to rad

## 2024-01-12 NOTE — Telephone Encounter (Signed)

## 2024-01-12 NOTE — ED Provider Notes (Signed)
 [facility] Provider Note    Event Date/Time   First MD Initiated Contact with Patient 01/12/24 1908     (approximate)  History   Chief Complaint: Fall, head injury, hip pain  HPI  Tiffany Caldwell is a 83 y.o. female with a past medical history of arthritis, hypertension, hyperlipidemia, recently diagnosed atrial fibrillation yesterday started on Eliquis presents to the emergency department after a fall.  According to EMS part patient was trying to step up a step when she lost her balance falling backwards hitting the back of her head.  Patient was complaining of pelvis pain/right hip pain and she was unable to stand so EMS was called and brought the patient to the emergency department for evaluation.  I reviewed the patient's chart from yesterday 4/7 patient was seen in the emergency department for what appears to be new onset atrial fibrillation was admitted to the hospital service started on Eliquis.  Physical Exam   Triage Vital Signs: ED Triage Vitals  Encounter Vitals Group     BP      Systolic BP Percentile      Diastolic BP Percentile      Pulse      Resp      Temp      Temp src      SpO2      Weight      Height      Head Circumference      Peak Flow      Pain Score      Pain Loc      Pain Education      Exclude from Growth Chart     Most recent vital signs: There were no vitals filed for this visit.  General: Awake, no distress.  Small hematoma palpated to occipital scalp with there is a small abrasion but no laceration. CV:  Good peripheral perfusion.  Regular rate and rhythm  Resp:  Normal effort.  Equal breath sounds bilaterally.  Abd:  No distention.  Soft, nontender.  No rebound or guarding. Other:  Patient states pain in the pelvis however good range of motion bilateral hips without tenderness identified.  Mild tenderness with right hip palpation.   ED Results / Procedures / Treatments   EKG  I have reviewed and  interpreted the EKG.  Patient has a normal sinus rhythm at 71 bpm with a narrow QRS, normal axis, normal intervals.  Patient does have inferolateral T wave inversions.  I reviewed the patient's EKG from yesterday and there does not appear to be inversions.  We will add on troponins to further evaluate.  RADIOLOGY  I have reviewed the right hip and pelvis images I do not appreciate any fracture my evaluation. Radiology is read the hip and x-ray fracture is negative, CT scan of the head is negative, CT scan of the cervical spine does not appear to show any acute fracture.   MEDICATIONS ORDERED IN ED: Medications - No data to display   IMPRESSION / MDM / ASSESSMENT AND PLAN / ED COURSE  I reviewed the triage vital signs and the nursing notes.  Patient's presentation is most consistent with acute presentation with potential threat to life or bodily function.  Patient presents emergency department after a fall with a head injury with an abrasion to the occipital scalp as well as pelvic pain.  Does have mild tenderness of right hip palpation we will obtain a right hip and pelvis x-ray to evaluate for  fracture.  Will obtain CT imaging of the head and C-spine as the patient was started yesterday on Eliquis.  We will check labs and EKG and continue to closely monitor.  Overall the patient appears well she is alert and oriented gives a good history.  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  I have reviewed the hip x-ray images I do not appreciate any significant fracture.  Awaiting radiology read to further evaluate.  Patient's EKG however does appear to show new inferolateral T wave inversions.  Labs have been sent including a troponin.  Patient's troponin is mildly elevated however large unchanged from historical values, CBC and chemistry showed no concerning finding.  Patient's x-ray does not appear to show any acute fracture of the hip however patient continues to complain of significant pain in the right  hip/pelvis.  Will proceed with a CT scan to further evaluate.  Patient does have some mild confusion, however she did receive fentanyl x 3 doses at this point which could explain that we will check a urinalysis as a precaution.  CT and urinalysis pending.  Patient care signed out to oncoming provider.   FINAL CLINICAL IMPRESSION(S) / ED DIAGNOSES   Fall Head injury   Note:  This document was prepared using Dragon voice recognition software and may include unintentional dictation errors.   Minna Antis, MD 01/12/24 2308

## 2024-01-12 NOTE — ED Triage Notes (Addendum)
 Pt presenting via EMS from [facility] after being d/c from here. Per EMS report her husband was helping her into the house when she attempted to step up on the curb, fell back and hit her head. Pt also endorsing R hip pain. Pt was recently started on eliquis, has only taken one dose. GCS 15 on arrival to ED. Received fent en route

## 2024-01-12 NOTE — Progress Notes (Signed)
 Occupational Therapy Evaluation Patient Details Name: Tiffany Caldwell MRN: [id number] DOB: 10-09-1940 Today's Date: 01/12/2024   History of Present Illness   Pt is an 83 yo female that presented to ED for weakness, exertional dyspnea. Workup for afib with RVR, PMH of COPD, CAD, chronic HFpEF, HCOM, CKD, chronic thrombocytopenia, pulmonary MAI, IBS, PAC, breast cancer.     Clinical Impressions Pt was seen for OT evaluation this date. Prior to hospital admission, pt was independent/MODI in all ADLs, and receiving assistance from husband for meals. Pt lives at independent living with her husband at [facility]. Pt presents to acute OT demonstrating impaired ADL performance and functional mobility 2/2  (See OT problem list for additional functional deficits). Pt completed bed mobility with supervision, requiring extra time but able to get to EOB without physical assistance. On initial STS from EOB pt required [facility] for lift off, throughout the rest of the session she was CGA during STS from various heights including regular toilets seat. Pt amb to hallway BR where she was able to complete pericare with CGA; once returned back to room pt requested to use the [facility] for BM. Pt required MAXA for pericare due to difficulty with problem solving with her clothing management and lines/leads. Pt returned to bed with all needs in reach, and husband at bedside. Husband endorses that Pt is fairly close to her baseline. VSS throughout. Pt would benefit from skilled OT services to address noted impairments and functional limitations (see below for any additional details) in order to maximize safety and independence while minimizing falls risk and caregiver burden.  OT will follow acutely.    If plan is discharge home, recommend the following:   A little help with walking and/or transfers;A little help with bathing/dressing/bathroom;Assistance with cooking/housework     Functional Status Assessment   Patient has  had a recent decline in their functional status and demonstrates the ability to make significant improvements in function in a reasonable and predictable amount of time.     Equipment Recommendations   BSC/3in1     Recommendations for Other Services         Precautions/Restrictions   Precautions Precautions: Fall Recall of Precautions/Restrictions: Intact Restrictions Weight Bearing Restrictions Per Provider Order: No     Mobility Bed Mobility Overal bed mobility: Needs Assistance Bed Mobility: Supine to Sit, Sit to Supine     Supine to sit: Supervision Sit to supine: Supervision   General bed mobility comments: Extra time required to complete, indep scoot self up in bed    Transfers Overall transfer level: Needs assistance Equipment used: Rolling walker (2 wheels) Transfers: Sit to/from Stand, Bed to chair/wheelchair/BSC Sit to Stand: Min assist, Contact guard assist           General transfer comment: On initial STS from EOB pt required MINA for lift off, throughout the rest of the session she was CGA during STS from various heights.      Balance Overall balance assessment: Needs assistance Sitting-balance support: Feet supported, No upper extremity supported Sitting balance-Leahy Scale: Fair Sitting balance - Comments: Often posterior lean to be able to seee author during session, able to maintain balance dispite signficant lean. Postural control: Posterior lean Standing balance support: Reliant on assistive device for balance, During functional activity, Bilateral upper extremity supported Standing balance-Leahy Scale: Fair                             ADL  either performed or assessed with clinical judgement   ADL Overall ADL's : Needs assistance/impaired Eating/Feeding: Set up;Sitting   Grooming: Wash/dry hands;Sitting               Lower Body Dressing: Contact guard assist;Sit to/from stand   Toilet Transfer: Contact guard  assist;Ambulation;Regular Toilet   Toileting- Clothing Manipulation and Hygiene: Maximal assistance;Sit to/from stand       Functional mobility during ADLs: Contact guard assist;Rolling walker (2 wheels) General ADL Comments: Pt amb to hallway BR where she was able to complete pericare with CGA; once returned back to room pt requested to use the [facility] for BM. Pt required MAXA for pericare due to difficulty with problem solving with her clothing management and lines/leads.     Vision Baseline Vision/History: 1 Wears glasses              Pertinent Vitals/Pain Pain Assessment Pain Assessment: No/denies pain     Extremity/Trunk Assessment Upper Extremity Assessment Upper Extremity Assessment: Defer to OT evaluation   Lower Extremity Assessment Lower Extremity Assessment: Generalized weakness   Cervical / Trunk Assessment Cervical / Trunk Assessment: Kyphotic   Communication Communication Communication: Impaired Factors Affecting Communication: Hearing impaired   Cognition Arousal: Alert Behavior During Therapy: WFL for tasks assessed/performed Cognition: Cognition impaired           Executive functioning impairment (select all impairments): Sequencing, Problem solving OT - Cognition Comments: A/Ox4                 Following commands: Impaired Following commands impaired: Follows one step commands with increased time     Cueing  General Comments   Cueing Techniques: Verbal cues;Gestural cues  Significant kyphotic posture noted   Exercises Exercises: Other exercises Other Exercises Other Exercises: Edu: Role of OT, safe ADL completion, DME management, handplacement sequencing during session   Shoulder Instructions      Home Living Family/patient expects to be discharged to:: Private residence Living Arrangements: Spouse/significant other   Type of Home: Independent living facility Home Access: Level entry (1 step from curb)     Home Layout: One  level     Bathroom Shower/Tub: Producer, television/film/video: Handicapped height Bathroom Accessibility: Yes How Accessible: Accessible via walker Home Equipment: Rollator (4 wheels);Grab bars - tub/shower;Grab bars - toilet;Cane - single point          Prior Functioning/Environment Prior Level of Function : Independent/Modified Independent;History of Falls (last six months)             Mobility Comments: Rollator ADLs Comments: indep in some IADL, hired assist for food drop off/ often door dash    OT Problem List: Decreased strength;Decreased activity tolerance;Impaired balance (sitting and/or standing);Decreased coordination;Decreased safety awareness;Decreased cognition;Decreased knowledge of use of DME or AE;Decreased knowledge of precautions   OT Treatment/Interventions: Therapeutic exercise;Self-care/ADL training;DME and/or AE instruction;Therapeutic activities;Patient/family education;Balance training      OT Goals(Current goals can be found in the care plan section)   Acute Rehab OT Goals Patient Stated Goal: To return to [facility] OT Goal Formulation: With patient Time For Goal Achievement: 01/26/24 Potential to Achieve Goals: Good   OT Frequency:  Min 2X/week    Co-evaluation              AM-PAC OT "6 Clicks" Daily Activity     Outcome Measure Help from another person eating meals?: A Little Help from another person taking care of personal grooming?: A Little Help from another person  toileting, which includes using toliet, bedpan, or urinal?: A Lot Help from another person bathing (including washing, rinsing, drying)?: A Little Help from another person to put on and taking off regular upper body clothing?: A Little Help from another person to put on and taking off regular lower body clothing?: A Little 6 Click Score: 17   End of Session Equipment Utilized During Treatment: Gait belt;Rolling walker (2 wheels) Nurse Communication: Mobility  status  Activity Tolerance: Patient tolerated treatment well Patient left: in bed;with family/visitor present;with bed alarm set;with call bell/phone within reach  OT Visit Diagnosis: Unsteadiness on feet (R26.81);Other abnormalities of gait and mobility (R26.89);Repeated falls (R29.6);Muscle weakness (generalized) (M62.81)                Time: 1301-1350 OT Time Calculation (min): 49 min Charges:  OT General Charges $OT Visit: 1 Visit OT Evaluation $OT Eval Low Complexity: 1 Low OT Treatments $Self Care/Home Management : 8-22 mins $Therapeutic Activity: 8-22 mins  Glenard Haring M.S. OTR/L  01/12/24, 2:57 PM

## 2024-01-13 ENCOUNTER — Ambulatory Visit: Payer: PPO | Admitting: Internal Medicine

## 2024-01-13 ENCOUNTER — Encounter: Payer: Self-pay | Admitting: Family Medicine

## 2024-01-13 DIAGNOSIS — W19XXXS Unspecified fall, sequela: Secondary | ICD-10-CM | POA: Diagnosis not present

## 2024-01-13 DIAGNOSIS — I5030 Unspecified diastolic (congestive) heart failure: Secondary | ICD-10-CM | POA: Diagnosis not present

## 2024-01-13 DIAGNOSIS — R7989 Other specified abnormal findings of blood chemistry: Secondary | ICD-10-CM | POA: Insufficient documentation

## 2024-01-13 DIAGNOSIS — G9340 Encephalopathy, unspecified: Secondary | ICD-10-CM | POA: Diagnosis not present

## 2024-01-13 DIAGNOSIS — I2510 Atherosclerotic heart disease of native coronary artery without angina pectoris: Secondary | ICD-10-CM

## 2024-01-13 DIAGNOSIS — I4891 Unspecified atrial fibrillation: Secondary | ICD-10-CM | POA: Diagnosis not present

## 2024-01-13 DIAGNOSIS — J449 Chronic obstructive pulmonary disease, unspecified: Secondary | ICD-10-CM

## 2024-01-13 DIAGNOSIS — I5032 Chronic diastolic (congestive) heart failure: Secondary | ICD-10-CM | POA: Insufficient documentation

## 2024-01-13 LAB — TROPONIN I (HIGH SENSITIVITY): Troponin I (High Sensitivity): 77 ng/L — ABNORMAL HIGH (ref <18)

## 2024-01-13 MED ORDER — OLANZAPINE 10 MG IM SOLR
2.5000 mg | Freq: Four times a day (QID) | INTRAMUSCULAR | Status: DC | PRN
  Administered 2024-01-13: 2.5 mg via INTRAMUSCULAR
  Filled 2024-01-13: qty 10

## 2024-01-13 MED ORDER — LORAZEPAM 2 MG/ML IJ SOLN
0.5000 mg | INTRAMUSCULAR | Status: DC | PRN
  Administered 2024-01-13: 0.5 mg via INTRAVENOUS
  Filled 2024-01-13: qty 1

## 2024-01-13 MED ORDER — LORAZEPAM 2 MG/ML IJ SOLN
0.5000 mg | Freq: Once | INTRAMUSCULAR | Status: AC
  Administered 2024-01-13: 0.5 mg via INTRAVENOUS
  Filled 2024-01-13: qty 1

## 2024-01-13 MED ORDER — FUROSEMIDE 40 MG PO TABS
20.0000 mg | ORAL_TABLET | ORAL | Status: DC
Start: 2024-01-13 — End: 2024-01-13

## 2024-01-13 MED ORDER — GABAPENTIN 300 MG PO CAPS
300.0000 mg | ORAL_CAPSULE | Freq: Four times a day (QID) | ORAL | Status: DC
  Administered 2024-01-14: 300 mg via ORAL
  Filled 2024-01-13 (×2): qty 1

## 2024-01-13 MED ORDER — SODIUM CHLORIDE 0.9 % IV SOLN: INTRAVENOUS | Status: AC

## 2024-01-13 MED ORDER — ONDANSETRON HCL 4 MG/2ML IJ SOLN: 4.0000 mg | Freq: Four times a day (QID) | INTRAMUSCULAR | Status: DC | PRN

## 2024-01-13 MED ORDER — MORPHINE SULFATE (PF) 4 MG/ML IV SOLN
4.0000 mg | INTRAVENOUS | Status: DC | PRN
  Administered 2024-01-13: 4 mg via INTRAVENOUS
  Filled 2024-01-13: qty 1

## 2024-01-13 MED ORDER — CARVEDILOL 6.25 MG PO TABS
12.5000 mg | ORAL_TABLET | Freq: Two times a day (BID) | ORAL | Status: DC
  Administered 2024-01-14 – 2024-01-18 (×8): 12.5 mg via ORAL
  Filled 2024-01-13 (×8): qty 2

## 2024-01-13 MED ORDER — ATORVASTATIN CALCIUM 20 MG PO TABS
20.0000 mg | ORAL_TABLET | Freq: Every day | ORAL | Status: DC
  Administered 2024-01-15 – 2024-01-17 (×3): 20 mg via ORAL
  Filled 2024-01-13 (×5): qty 1

## 2024-01-13 MED ORDER — APIXABAN 2.5 MG PO TABS
2.5000 mg | ORAL_TABLET | Freq: Two times a day (BID) | ORAL | Status: DC
  Administered 2024-01-14 – 2024-01-17 (×6): 2.5 mg via ORAL
  Filled 2024-01-13 (×6): qty 1

## 2024-01-13 MED ORDER — ONDANSETRON HCL 4 MG PO TABS: 4.0000 mg | ORAL_TABLET | Freq: Four times a day (QID) | ORAL | Status: DC | PRN

## 2024-01-13 MED ORDER — ENOXAPARIN SODIUM 40 MG/0.4ML IJ SOSY: 40.0000 mg | PREFILLED_SYRINGE | INTRAMUSCULAR | Status: DC

## 2024-01-13 MED ORDER — HYDRALAZINE HCL 20 MG/ML IJ SOLN
10.0000 mg | INTRAMUSCULAR | Status: DC | PRN
  Administered 2024-01-13 – 2024-01-15 (×3): 10 mg via INTRAVENOUS
  Filled 2024-01-13 (×3): qty 1

## 2024-01-13 NOTE — Assessment & Plan Note (Addendum)
 Fall Positive generalized delirium and confusion in the setting of recent fall and head trauma Noted subacute dementia chronically per the family CT head grossly stable Plain fim/imaging grossly WNL  Clinically dry Notable multiple head traumas over the past 2 to 3 weeks. Suspect likely multifactorial with contributions of dehydration, recurrent falls,?  Concussion Nonfocal neuroexam No overt infection noted IV fluid hydration Fall precautions PT OT evaluation Monitor mentation closely

## 2024-01-13 NOTE — ED Notes (Signed)
 Pt experiencing increased confusion and restlessness. Only alert and oriented to self. Was alert and oriented x 4 upon arrival with EMS. Minna Antis, MD notified.

## 2024-01-13 NOTE — H&P (Signed)
 History and Physical    Patient: Tiffany Caldwell ZOX:[id number] DOB: 1941/05/01 DOA: 01/12/2024 DOS: the patient was seen and examined on 01/13/2024 PCP: Delma Officer, PA  Patient coming from: Home  Chief Complaint:  Chief Complaint  Patient presents with   Fall   HPI: Tiffany Caldwell is a 83 y.o. female with medical history significant of COPD, CAD, chronic HFpEF, HCOM, CKD stage IIIa, chronic thrombocytopenia, pulmonary MAI, IBS, PVC/PAC, breast cancer presented with fall, encephalopathy.  Patient noted been discharged from the hospital yesterday for issues including A-fib with RVR as well as acute on chronic HFpEF.  Per report, patient was discharged and had subsequent fall going back to living facility.  Husband attempted to use wheelchair to help patient ambulate with patient subsequently fell landing on her backside/hip as well as the head.  Husband reports similar fall roughly 2 weeks ago.  No focal hemiparesis.  Husband does admit to dementia like symptoms for several months to years.  No formal diagnosis prior.  No chest pain or shortness of breath.  No reported loss conscious.  No reports of chest pain abdominal pain or nausea.  Was discharged on Eliquis.  Husband reports compliance with medication. Presented to the ER afebrile, hemodynamically stable.  Satting well on room air.  White count 5, hemoglobin 15.4, platelets 167, troponin 70s.  Urinalysis not indicative of infection.  Creatinine 1.03.  CT head and CT C-spine grossly within normal limits.  Pelvic CT also negative for any acute fractures.  Noted status post right hip replacement. Review of Systems: As mentioned in the history of present illness. All other systems reviewed and are negative. Past Medical History:  Diagnosis Date   Allergy    Arthritis    Breast cancer (HCC)    left breast   Coronary artery disease 2001   CABG   DDD (degenerative disc disease), lumbosacral    Dyspnea    if rushing or climmbing lots  of stairs   Emphysema lung (HCC)    emphysema   GERD (gastroesophageal reflux disease)    11/20/16- no a current problem   Hyperlipidemia    Hypertension    Irritable bowel syndrome (IBS)    Lung disorder    leison   Neuropathy    Pinched nerve    back and left leg   Pneumonia 2016   Past Surgical History:  Procedure Laterality Date   BREAST FIBROADENOMA SURGERY  01/13/1980   BREAST SURGERY  02/01/2004   tram/mastectomyfor recurrence   CARPOMETACARPEL SUSPENSION PLASTY Right 12/17/2017   Procedure: SUSPENSIONPLASTY RIGHT THUMB WITH EXCISION TRAPEZIUM;  Surgeon: Cindee Salt, MD;  Location: [facility];  Service: Orthopedics;  Laterality: Right;   COLONOSCOPY     CORONARY ANGIOPLASTY  2001   had tear to two vessels- had to have open heart surgery   CORONARY ARTERY BYPASS GRAFT  2001   EYE SURGERY Bilateral    Cataract   KNEE ARTHROSCOPY  January 2011   right   MASTECTOMY PARTIAL / LUMPECTOMY W/ AXILLARY LYMPHADENECTOMY  04/10/1989   Dr Maple Hudson / left breast   PARTIAL HYSTERECTOMY     vaginal   RIGHT/LEFT HEART CATH AND CORONARY ANGIOGRAPHY Bilateral 06/17/2023   Procedure: RIGHT/LEFT HEART CATH AND CORONARY ANGIOGRAPHY;  Surgeon: Yvonne Kendall, MD;  Location: [facility];  Service: Cardiovascular;  Laterality: Bilateral;   TENDON TRANSFER Right 12/17/2017   Procedure: ABDUCTOR POLLICIS LONGUS TRANSFER;  Surgeon: Cindee Salt, MD;  Location: [facility];  Service: Orthopedics;  Laterality: Right;   TONSILLECTOMY AND ADENOIDECTOMY     TOTAL HIP ARTHROPLASTY Right 08/05/2016   Procedure: TOTAL HIP ARTHROPLASTY ANTERIOR APPROACH;  Surgeon: Marcene Corning, MD;  Location: [facility] OR;  Service: Orthopedics;  Laterality: Right;   UPPER GASTROINTESTINAL ENDOSCOPY     VIDEO BRONCHOSCOPY WITH ENDOBRONCHIAL NAVIGATION N/A 11/27/2016   Procedure: VIDEO BRONCHOSCOPY WITH ENDOBRONCHIAL NAVIGATION;  Surgeon: Loreli Slot, MD;  [facility];  Service:  Thoracic;  Laterality: N/A;   Social History:  reports that she quit smoking about 34 years ago. Her smoking use included cigarettes. She started smoking about 64 years ago. She has never used smokeless tobacco. She reports current alcohol use of about 1.0 standard drink of alcohol per week. She reports that she does not use drugs.  Allergies  Allergen Reactions   Amlodipine Swelling    Leg swelling and fatigue.   Hydromorphone Other (See Comments)    ALTERED MENTAL STATUS Altered mental status   Azithromycin Rash   Ciprofloxacin Nausea Only and Nausea And Vomiting   Hydromorphone Hcl Other (See Comments)    Altered mental status Altered mental status   Levofloxacin Nausea Only and Other (See Comments)   Lisinopril Other (See Comments)    Fatigue  Fatigue    Family History  Problem Relation Age of Onset   Emphysema Father        deceased   Heart disease Mother        deceased   Colon cancer Paternal Grandmother    Colon cancer Son    Colon cancer Paternal Uncle     Prior to Admission medications   Medication Sig Start Date End Date Taking? Authorizing Provider  acetaminophen (TYLENOL) 500 MG tablet Take 1,000 mg by mouth 2 (two) times daily.    Yes [provider]  albuterol (VENTOLIN HFA) 108 (90 Base) MCG/ACT inhaler INHALE 1-2 PUFFS BY MOUTH EVERY 6 HOURS AS NEEDED FOR WHEEZING OR SHORTNESS OF BREATH Patient taking differently: Inhale 1-2 puffs into the lungs every 6 (six) hours as needed for wheezing or shortness of breath. prn 06/20/23  Yes Byrum, Les Pou, MD  apixaban (ELIQUIS) 2.5 MG TABS tablet Take 1 tablet (2.5 mg total) by mouth 2 (two) times daily. 01/12/24 03/12/24 Yes Sreenath, Sudheer B, MD  atorvastatin (LIPITOR) 20 MG tablet Take 1 tablet (20 mg total) by mouth daily. 03/13/23  Yes End, Cristal Deer, MD  Biotin 95621 MCG TABS Take 1 tablet by mouth daily.   Yes [provider]  carvedilol (COREG) 12.5 MG tablet Take 1 tablet (12.5 mg total) by  mouth 2 (two) times daily with a meal. 01/12/24 03/12/24 Yes Sreenath, Sudheer B, MD  fexofenadine (ALLEGRA) 180 MG tablet Take 180 mg by mouth daily.   Yes [provider]  fish oil-omega-3 fatty acids 1000 MG capsule Take 1 g by mouth daily.    Yes [provider]  furosemide (LASIX) 20 MG tablet Take 1 tablet (20 mg total) by mouth every other day. Take 1 tablet 3 times a week (Monday, Wednesday and Friday) as needed. 11/09/23  Yes Enedina Finner, MD  gabapentin (NEURONTIN) 300 MG capsule Take 300 mg by mouth 4 (four) times daily.   Yes [provider]  Glucosamine-Chondroit-Vit C-Mn (GLUCOSAMINE CHONDR 1500 COMPLX PO) Take 1 tablet by mouth once.   Yes [provider]  metoprolol succinate (TOPROL-XL) 25 MG 24 hr tablet Take 25 mg by mouth 2 (two) times daily.   Yes  [provider]  mexiletine (MEXITIL) 150 MG capsule Take 1 capsule (150 mg total) by mouth 2 (two) times daily. 10/13/23  Yes Nobie Putnam, MD  Multiple Vitamin (MULTIVITAMIN) tablet Take 1 tablet by mouth daily.     Yes [provider]  nitroGLYCERIN (NITROSTAT) 0.4 MG SL tablet Place 0.4 mg under the tongue every 5 (five) minutes as needed for chest pain.   Yes [provider]  potassium chloride (KLOR-CON M) 10 MEQ tablet Take 1 tablet (10 mEq total) by mouth every other day. 11/09/23  Yes Enedina Finner, MD  umeclidinium-vilanterol [facility] ELLIPTA) 62.5-25 MCG/ACT AEPB INHALE 1 PUFF INTO THE LUNGS BY MOUTH ONCE DAILY 11/04/23  Yes Leslye Peer, MD  feeding supplement (ENSURE ENLIVE / ENSURE PLUS) LIQD Take 237 mLs by mouth 2 (two) times daily between meals. 11/08/23   Enedina Finner, MD    Physical Exam: Vitals:   01/13/24 0654 01/13/24 0709 01/13/24 0915 01/13/24 1030  BP: (!) 169/99  138/84 136/83  Pulse: 90  64 71  Resp:   (!) 22 15  Temp:      TempSrc:      SpO2: 95% 95% 100%   Weight:      Height:       Physical Exam Constitutional:      Appearance: She is normal  weight.     Comments: Mild generalized lethargy s/p narcotics    HENT:     Head: Normocephalic.     Nose: Nose normal.     Mouth/Throat:     Mouth: Mucous membranes are moist.  Eyes:     Pupils: Pupils are equal, round, and reactive to light.  Cardiovascular:     Rate and Rhythm: Normal rate and regular rhythm.  Pulmonary:     Effort: Pulmonary effort is normal.  Abdominal:     General: Bowel sounds are normal.  Musculoskeletal:        General: Normal range of motion.  Skin:    General: Skin is warm.  Neurological:     General: No focal deficit present.  Psychiatric:        Mood and Affect: Mood normal.     Data Reviewed:  There are no new results to review at this time. CT PELVIS WO CONTRAST CLINICAL DATA:  Hip trauma right hip pain fall  EXAM: CT PELVIS WITHOUT CONTRAST  TECHNIQUE: Multidetector CT imaging of the pelvis was performed following the standard protocol without intravenous contrast.  RADIATION DOSE REDUCTION: This exam was performed according to the departmental dose-optimization program which includes automated exposure control, adjustment of the mA and/or kV according to patient size and/or use of iterative reconstruction technique.  COMPARISON:  Radiograph 01/12/2024, CT 11/05/2023  FINDINGS: Urinary Tract: Partially obscured by artifact from right hip hardware. No acute abnormality  Bowel: Diverticular disease of the sigmoid colon. No acute wall thickening  Vascular/Lymphatic: Atherosclerosis. No aneurysm or suspicious lymph nodes  Reproductive:  Negative for adnexal mass  Other:  Negative for pelvic effusion  Musculoskeletal: Right hip replacement with normal alignment. No definitive fracture is seen. No SI joint widening. Pubic symphysis and rami appear intact.  IMPRESSION: 1. Right hip replacement with normal alignment. No definitive fracture is seen. 2. Diverticular disease of the sigmoid colon.  Electronically Signed   By:  Jasmine Pang M.D.   On: 01/13/2024 01:44  Lab Results  Component Value Date   WBC 5.1 01/12/2024   HGB 15.4 (H) 01/12/2024   HCT 47.5 (H) 01/12/2024  MCV 101.5 (H) 01/12/2024   PLT 167 01/12/2024   Last metabolic panel Lab Results  Component Value Date   GLUCOSE 115 (H) 01/12/2024   NA 137 01/12/2024   K 4.4 01/12/2024   CL 102 01/12/2024   CO2 23 01/12/2024   BUN 28 (H) 01/12/2024   CREATININE 1.03 (H) 01/12/2024   GFRNONAA 54 (L) 01/12/2024   CALCIUM 10.4 (H) 01/12/2024   PHOS 3.1 01/11/2024   PROT 6.9 01/12/2024   ALBUMIN 3.7 01/12/2024   BILITOT 1.0 01/12/2024   ALKPHOS 100 01/12/2024   AST 45 (H) 01/12/2024   ALT 28 01/12/2024   ANIONGAP 12 01/12/2024    Assessment and Plan: * Acute encephalopathy Fall Positive generalized delirium and confusion in the setting of recent fall and head trauma Noted subacute dementia chronically per the family CT head grossly stable Plain fim/imaging grossly WNL  Clinically dry Notable multiple head traumas over the past 2 to 3 weeks. Suspect likely multifactorial with contributions of dehydration, recurrent falls,?  Concussion Nonfocal neuroexam No overt infection noted IV fluid hydration Fall precautions PT OT evaluation Monitor mentation closely  COPD (chronic obstructive pulmonary disease) (HCC) Stable from a respiratory standpoint Continue home inhalers  Essential hypertension BP stable Titrate home regimen  Chronic kidney disease, stage 3a (HCC) Creatinine 1.03 with GFR in the 50s Monitor  Elevated troponin Trop 100s-->70s in setting of fall  Suspect minimal to mild demand ischemia  Downtrending troponin without chest pain  Monitor   Chronic heart failure with preserved ejection fraction (HFpEF) (HCC) 2D echo August 2024 with EF of 55 to 60% and grade 2 diastolic dysfunction Clinically dry on presentation Gentle IV fluid hydration in the setting of dehydration Monitor volume status closely Weight  today 53.8 kg  Afib (HCC) Based on paroxysmal atrial fibrillation with recent evaluation/admission for similar issues Continue Coreg On low-dose Eliquis Consider holding long-term given recurrent fall risk        Advance Care Planning:   Code Status: Full Code   Consults: None   Family Communication: Family at the bedside   Severity of Illness: The appropriate patient status for this patient is OBSERVATION. Observation status is judged to be reasonable and necessary in order to provide the required intensity of service to ensure the patient's safety. The patient's presenting symptoms, physical exam findings, and initial radiographic and laboratory data in the context of their medical condition is felt to place them at decreased risk for further clinical deterioration. Furthermore, it is anticipated that the patient will be medically stable for discharge from the hospital within 2 midnights of admission.   Author: Floydene Flock, MD 01/13/2024 11:55 AM  For on call review [email or web address].

## 2024-01-13 NOTE — Assessment & Plan Note (Signed)
 Creatinine 1.03 with GFR in the 50s Monitor

## 2024-01-13 NOTE — Assessment & Plan Note (Signed)
 Based on paroxysmal atrial fibrillation with recent evaluation/admission for similar issues Continue Coreg On low-dose Eliquis Consider holding long-term given recurrent fall risk

## 2024-01-13 NOTE — ED Notes (Signed)
 NT Mariah at bedside

## 2024-01-13 NOTE — Assessment & Plan Note (Signed)
 Trop 100s-->70s in setting of fall  Suspect minimal to mild demand ischemia  Downtrending troponin without chest pain  Monitor

## 2024-01-13 NOTE — ED Notes (Signed)
 Pt refusing to take po pills. Pt educated on importance of taking medications. Family is also at bedside convincing pt to take meds. Pt still refuses. MD, Alvester Morin notified. Pt is tugging and throwing blankets.

## 2024-01-13 NOTE — ED Notes (Signed)
 Pt refused to take PO meds and slapped this RN on the arm. Pt was redirected to bed and mittens were placed. Pt continued to attempt to get out of bed and pull lines. This RN placed coband around IV. Son and husband at bedside

## 2024-01-13 NOTE — Assessment & Plan Note (Signed)
 2D echo August 2024 with EF of 55 to 60% and grade 2 diastolic dysfunction Clinically dry on presentation Gentle IV fluid hydration in the setting of dehydration Monitor volume status closely Weight today 53.8 kg

## 2024-01-13 NOTE — Assessment & Plan Note (Signed)
 Stable from a respiratory standpoint Continue home inhalers

## 2024-01-13 NOTE — ED Notes (Signed)
 Pt son and husband updated on pt POC. Pt is anxious and stating she is having abdominal pain. Morphine given and 2 L o2 [place] applied due to pt o2 sat dropping to upper 80's.

## 2024-01-13 NOTE — ED Notes (Signed)
 This nurse went into pt room to reposition BP cuff. Pt refused stating, "No! Get away from me!" Pt still confused at this time.

## 2024-01-13 NOTE — Assessment & Plan Note (Signed)
 BP stable Titrate home regimen

## 2024-01-13 NOTE — ED Notes (Signed)
 Pt was to be moved to C pod. Upon awakening pt more agitated and confused. Pulling VS monitoring equipment off, pulling gown off, grabbing staff wrists, and yelling at staff.

## 2024-01-13 NOTE — ED Provider Notes (Addendum)
 CT of the head with no acute findings.  Patient altered since arrival, may be due to the fentanyl given by EMS or may be due to a concussion from her head strike with a fall today.  She has bilateral hip pain, gingerly ranging both, states that has been going on for many weeks.  She has no urinary symptoms and otherwise lab testing unremarkable.  She is confused, disoriented, and family who is at bedside states that this is not her normal self.  On my neurologic exam other than her disorientation/confusion, she has no dysarthria or facial asymmetry or focal motor deficits to suggest stroke.  Given ability to range, bilateral hip pain, chronicity of symptoms, no leukocytosis or fever I doubt hip infection.  Given her altered mental status and head injury will consult for hospitalist admission-spoke with hospitalist about the patient's clinical status, recommend reevaluating in the early morning and if persistently altered and no improvement will reconsult for admission.     Pilar Jarvis, MD 01/13/24 [phone]

## 2024-01-13 NOTE — ED Notes (Signed)
 Alvester Morin, MD made aware of pt refusing PO meds.

## 2024-01-13 NOTE — ED Notes (Signed)
 Son at bedside.

## 2024-01-14 DIAGNOSIS — I480 Paroxysmal atrial fibrillation: Secondary | ICD-10-CM | POA: Diagnosis not present

## 2024-01-14 DIAGNOSIS — R531 Weakness: Secondary | ICD-10-CM | POA: Diagnosis not present

## 2024-01-14 DIAGNOSIS — I10 Essential (primary) hypertension: Secondary | ICD-10-CM | POA: Diagnosis not present

## 2024-01-14 DIAGNOSIS — I5032 Chronic diastolic (congestive) heart failure: Secondary | ICD-10-CM | POA: Diagnosis not present

## 2024-01-14 DIAGNOSIS — J449 Chronic obstructive pulmonary disease, unspecified: Secondary | ICD-10-CM | POA: Diagnosis not present

## 2024-01-14 DIAGNOSIS — G9340 Encephalopathy, unspecified: Secondary | ICD-10-CM | POA: Diagnosis not present

## 2024-01-14 DIAGNOSIS — R7989 Other specified abnormal findings of blood chemistry: Secondary | ICD-10-CM | POA: Diagnosis not present

## 2024-01-14 DIAGNOSIS — R296 Repeated falls: Secondary | ICD-10-CM | POA: Diagnosis not present

## 2024-01-14 DIAGNOSIS — N1831 Chronic kidney disease, stage 3a: Secondary | ICD-10-CM | POA: Diagnosis not present

## 2024-01-14 LAB — COMPREHENSIVE METABOLIC PANEL WITH GFR
ALT: 29 U/L (ref 0–44)
AST: 53 U/L — ABNORMAL HIGH (ref 15–41)
Albumin: 3.3 g/dL — ABNORMAL LOW (ref 3.5–5.0)
Alkaline Phosphatase: 90 U/L (ref 38–126)
Anion gap: 8 (ref 5–15)
BUN: 33 mg/dL — ABNORMAL HIGH (ref 8–23)
CO2: 27 mmol/L (ref 22–32)
Calcium: 10.5 mg/dL — ABNORMAL HIGH (ref 8.9–10.3)
Chloride: 107 mmol/L (ref 98–111)
Creatinine, Ser: 0.96 mg/dL (ref 0.44–1.00)
GFR, Estimated: 58 mL/min — ABNORMAL LOW (ref >60)
Glucose, Bld: 86 mg/dL (ref 70–99)
Potassium: 4.3 mmol/L (ref 3.5–5.1)
Sodium: 142 mmol/L (ref 135–145)
Total Bilirubin: 1.4 mg/dL — ABNORMAL HIGH (ref 0.0–1.2)
Total Protein: 6.5 g/dL (ref 6.5–8.1)

## 2024-01-14 LAB — CBC
HCT: 44.9 % (ref 36.0–46.0)
Hemoglobin: 14.9 g/dL (ref 12.0–15.0)
MCH: 33 pg (ref 26.0–34.0)
MCHC: 33.2 g/dL (ref 30.0–36.0)
MCV: 99.6 fL (ref 80.0–100.0)
Platelets: 149 10*3/uL — ABNORMAL LOW (ref 150–400)
RBC: 4.51 MIL/uL (ref 3.87–5.11)
RDW: 13.9 % (ref 11.5–15.5)
WBC: 6.5 10*3/uL (ref 4.0–10.5)
nRBC: 0 % (ref 0.0–0.2)

## 2024-01-14 MED ORDER — METOPROLOL TARTRATE 5 MG/5ML IV SOLN
5.0000 mg | Freq: Once | INTRAVENOUS | Status: AC
Start: 2024-01-14 — End: 2024-01-14
  Administered 2024-01-14: 5 mg via INTRAVENOUS
  Filled 2024-01-14: qty 5

## 2024-01-14 MED ORDER — SODIUM CHLORIDE 0.9 % IV SOLN: INTRAVENOUS | Status: AC

## 2024-01-14 MED ORDER — ENSURE ENLIVE PO LIQD
237.0000 mL | Freq: Two times a day (BID) | ORAL | Status: DC
  Administered 2024-01-14 – 2024-01-18 (×5): 237 mL via ORAL

## 2024-01-14 NOTE — Evaluation (Signed)
 Occupational Therapy Evaluation Patient Details Name: Tiffany Caldwell MRN: [id number] DOB: 1941/07/03 Today's Date: 01/14/2024   History of Present Illness   Pt is an 83 y.o. female presented with fall, encephalopathy. Pt was discharged from hospital the day prior for A-fib with RVR and acute on chronic HFpEF, but fell going back to her living facility. PMH of COPD, CAD, chronic HFpEF, HCOM, CKD stage IIIa, chronic thrombocytopenia, pulmonary MAI, IBS, PVC/PAC, breast cancer     Clinical Impressions Pt was seen for PT/OT co-evaluation this date. Prior to hospital admission, pt was residing at [facility] with her husband and was MOD I with rollator use and for ADLs as well as some IADLs prior to recent admissions.  Pt presents to acute OT demonstrating impaired ADL performance and functional mobility 2/2 weakness, falls, lethargy, balance deficits and low activity tolerance. Pt is lethargic, doesn't open eyes fully during session, minimally verbal. Difficulty following commands d/t lethargy. She currently requires Max A x2 for bed mobility and Min to Max A with posterior lean and kyphotic posture while seated at EOB. Total assist x2 to SPT to [facility] with difficulty advancing BLEs during transfer to [facility], noted improvement with return to [facility], but still needing x2 assist for safety. Urinated in the floor requiring clean up. HR up to 130 during session. Sp02 on RA 90-92%. Pt would benefit from skilled OT services to address noted impairments and functional limitations to maximize safety and independence while minimizing falls risk and caregiver burden. Do anticipate the need for follow up OT services upon acute hospital DC.      If plan is discharge home, recommend the following:   Two people to help with walking and/or transfers;A lot of help with bathing/dressing/bathroom;Two people to help with bathing/dressing/bathroom;Assistance with cooking/housework;Help with stairs or ramp for  entrance;Direct supervision/assist for medications management;Direct supervision/assist for financial management;Supervision due to cognitive status     Functional Status Assessment   Patient has had a recent decline in their functional status and demonstrates the ability to make significant improvements in function in a reasonable and predictable amount of time.     Equipment Recommendations   Other (comment) (defer to next venue)     Recommendations for Other Services         Precautions/Restrictions   Precautions Precautions: Fall Recall of Precautions/Restrictions: Intact Restrictions Weight Bearing Restrictions Per Provider Order: No     Mobility Bed Mobility Overal bed mobility: Needs Assistance Bed Mobility: Supine to Sit, Sit to Supine     Supine to sit: Max assist, +2 for safety/equipment, +2 for physical assistance Sit to supine: Max assist, +2 for physical assistance, +2 for safety/equipment   General bed mobility comments: lethargic, unable to follow cues; anxious/scared with mobility attempts anticipated; kyphotic posture noted    Transfers Overall transfer level: Needs assistance Equipment used: 2 person hand held assist Transfers: Bed to chair/wheelchair/BSC, Sit to/from Stand Sit to Stand: Max assist, +2 safety/equipment Stand pivot transfers: Total assist, +2 physical assistance         General transfer comment: pt did not move feet during transfer from bed to [facility] needed physical placement of LLE began voiding while standing at [facility]; able to sit with increased time once leg positioned properly; improved to with ability to move  BLEs to return to bed      Balance Overall balance assessment: Needs assistance Sitting-balance support: Feet supported Sitting balance-Leahy Scale: Poor Sitting balance - Comments: constant Min to Max A for seated  balance Postural control: Posterior lean Standing balance support: Bilateral upper extremity  supported Standing balance-Leahy Scale: Zero Standing balance comment: no device used; total A x2 for SPT                           ADL either performed or assessed with clinical judgement   ADL Overall ADL's : Needs assistance/impaired                         Toilet Transfer: +2 for physical assistance;Total assistance   Toileting- Clothing Manipulation and Hygiene: Total assistance         General ADL Comments: lethargic and has difficulty following commands, ultimately Max/total assist x2 for transfers     Vision         Perception         Praxis         Pertinent Vitals/Pain Pain Assessment Pain Assessment: Faces Faces Pain Scale: Hurts little more Pain Location: buttocks and abdomen Pain Descriptors / Indicators: Aching, Sore Pain Intervention(s): Monitored during session, Repositioned     Extremity/Trunk Assessment Upper Extremity Assessment Upper Extremity Assessment: Generalized weakness;Difficult to assess due to impaired cognition   Lower Extremity Assessment Lower Extremity Assessment: Generalized weakness;Difficult to assess due to impaired cognition   Cervical / Trunk Assessment Cervical / Trunk Assessment: Kyphotic   Communication Communication Communication: Impaired Factors Affecting Communication: Hearing impaired   Cognition Arousal: Lethargic Behavior During Therapy: WFL for tasks assessed/performed                                 Following commands: Impaired Following commands impaired: Follows one step commands inconsistently     Cueing  General Comments   Cueing Techniques: Verbal cues;Gestural cues  significant kyphotic posture noted; HR up to 130 with transfer; sp02 90-92% on RA with return to bed   Exercises Other Exercises Other Exercises: Edu on role of OT in acute setting.   Shoulder Instructions      Home Living Family/patient expects to be discharged to:: Private  residence Living Arrangements: Spouse/significant other Available Help at Discharge: Family;Available 24 hours/day Type of Home: Independent living facility Home Access: Level entry     Home Layout: One level     Bathroom Shower/Tub: Producer, television/film/video: Handicapped height     Home Equipment: Rollator (4 wheels);Grab bars - tub/shower;Grab bars - toilet;Cane - single point   Additional Comments: lives at [facility]      Prior Functioning/Environment Prior Level of Function : Independent/Modified Independent;History of Falls (last six months)             Mobility Comments: Rollator- all PLOF obtained from last admission. Pt confused and unable to answer questions at this time ADLs Comments: indep in some IADL, hired assist for food drop off/ often door dash    OT Problem List: Decreased strength;Decreased activity tolerance;Impaired balance (sitting and/or standing);Decreased coordination;Decreased safety awareness;Decreased cognition;Decreased knowledge of use of DME or AE;Decreased knowledge of precautions   OT Treatment/Interventions: Therapeutic exercise;Self-care/ADL training;DME and/or AE instruction;Therapeutic activities;Patient/family education;Balance training      OT Goals(Current goals can be found in the care plan section)   Acute Rehab OT Goals Patient Stated Goal: improve alertness OT Goal Formulation: With family Time For Goal Achievement: 01/28/24 Potential to Achieve Goals: Good ADL Goals Pt Will Perform Grooming:  sitting;with min assist Pt Will Perform Upper Body Bathing: sitting;with min assist Pt Will Transfer to Toilet: with min assist;with mod assist;bedside commode;stand pivot transfer   OT Frequency:  Min 2X/week    Co-evaluation PT/OT/SLP Co-Evaluation/Treatment: Yes Reason for Co-Treatment: Necessary to address cognition/behavior during functional activity;For patient/therapist safety;To address functional/ADL  transfers PT goals addressed during session: Mobility/safety with mobility;Strengthening/ROM;Balance OT goals addressed during session: ADL's and self-care      AM-PAC OT "6 Clicks" Daily Activity     Outcome Measure Help from another person eating meals?: A Lot Help from another person taking care of personal grooming?: A Lot Help from another person toileting, which includes using toliet, bedpan, or urinal?: A Lot Help from another person bathing (including washing, rinsing, drying)?: Total Help from another person to put on and taking off regular upper body clothing?: A Lot Help from another person to put on and taking off regular lower body clothing?: Total 6 Click Score: 10   End of Session Nurse Communication: Mobility status  Activity Tolerance: Patient limited by lethargy Patient left: in bed;with family/visitor present;with bed alarm set;with call bell/phone within reach  OT Visit Diagnosis: Unsteadiness on feet (R26.81);Other abnormalities of gait and mobility (R26.89);Repeated falls (R29.6);Muscle weakness (generalized) (M62.81)                Time: 0630-1601 OT Time Calculation (min): 32 min Charges:  OT General Charges $OT Visit: 1 Visit OT Evaluation $OT Eval Moderate Complexity: 1 Mod  Klarisa Barman, OTR/L 01/14/24, 12:39 PM  Stacy Sailer E Desmen Schoffstall 01/14/2024, 12:35 PM

## 2024-01-14 NOTE — Progress Notes (Signed)
 Progress Note   Patient: Tiffany Caldwell NUU:[id number] DOB: 1941-01-01 DOA: 01/12/2024     0 DOS: the patient was seen and examined on 01/14/2024   Brief hospital course: Tiffany Caldwell is a 83 y.o. female with medical history significant of COPD, CAD, chronic HFpEF, HCOM, CKD stage IIIa, chronic thrombocytopenia, pulmonary MAI, IBS, PVC/PAC, breast cancer, recently discharged after treating for Afib, heart failure presented with fall, encephalopathy.   Assessment and Plan: * Acute encephalopathy S/p Fall Generalized weakness She is more lethargic and sleepy. Patient did get Zyprexa, Ativan, morphine yesterday.  Family at bedside stated that her mental status deteriorated in the last couple of days. CT head grossly stable. Plain fim/imaging grossly WNL  Clinically dry, continue gentle IV fluids. Notable multiple head traumas over the past 2 to 3 weeks. Suspect likely multifactorial with contributions of dehydration, recurrent falls and/or concussion. Nonfocal neuroexam. No overt infection noted. Avoid sedatives, IV pain medications, gabapentin. Fall, aspiration precautions PT OT follow-up Monitor neurochecks closely  COPD (chronic obstructive pulmonary disease) (HCC) Stable from a respiratory standpoint Continue home inhalers  Essential hypertension BP stable. Will continue current home medications if she is able to tolerate oral.  Chronic kidney disease, stage 3a (HCC) Creatinine 1.03 with GFR in the 50s Monitor  Elevated troponin Trop 100s-->70s in setting of fall  Suspect minimal to mild demand ischemia  Downtrending troponin without chest pain   Chronic heart failure with preserved ejection fraction (HFpEF) (HCC) 2D echo August 2024 with EF of 55 to 60% and grade 2 diastolic dysfunction Clinically dry on presentation Gentle IV fluid hydration in the setting of dehydration Watch for fluid overload.  Afib (HCC) Based on paroxysmal atrial fibrillation with recent  evaluation/admission for similar issues. Continue Coreg On low-dose Eliquis.  Will discuss with family regarding stopping anticoagulation long-term given fall risk.     Out of bed to chair. Incentive spirometry. Nursing supportive care. Fall, aspiration precautions. Diet:  Diet Orders (From admission, onward)     Start     Ordered   01/14/24 0844  Diet 2 gram sodium Fluid consistency: Thin  Diet effective now       Question:  Fluid consistency:  Answer:  Thin   01/14/24 0843           DVT prophylaxis: apixaban (ELIQUIS) tablet 2.5 mg Start: 01/14/24 1000 apixaban (ELIQUIS) tablet 2.5 mg  Level of care: Telemetry Medical   Code Status: Full Code  Subjective: Patient is seen and examined today morning. She is more sleepy and lethargic. Per RN able to take her meds. She got doses of Zyprexa, morphine, ativan yesterday. PT at bedside worked with her, she is requiring 2 people assist. Son, husband at bedside.  Physical Exam: Vitals:   01/14/24 0603 01/14/24 0757 01/14/24 1053 01/14/24 1133  BP: (!) 185/99 (!) 140/83  (!) 141/59  Pulse: 68 82  (!) 59  Resp:  18  18  Temp: 98.6 F (37 C) 98.3 F (36.8 C)  97.7 F (36.5 C)  TempSrc: Oral Oral  Oral  SpO2: 100% 94% 92% 92%  Weight:      Height:        General - Elderly thin built Caucasian female, sleepy and lethargic HEENT - PERRLA, EOMI, atraumatic head, non tender sinuses. Lung - Clear, basal rales, rhonchi, wheezes. Heart - S1, S2 heard, no murmurs, rubs, trace pedal edema. Abdomen - Soft, non tender, bowel sounds good Neuro -Sleepy, lethargic, non focal exam. Skin - Warm and  dry.  Data Reviewed:      Latest Ref Rng & Units 01/14/2024    5:38 AM 01/12/2024    8:49 PM 01/11/2024   12:43 PM  CBC  WBC 4.0 - 10.5 K/uL 6.5  5.1  5.6   Hemoglobin 12.0 - 15.0 g/dL 16.1  09.6  04.5   Hematocrit 36.0 - 46.0 % 44.9  47.5  44.7   Platelets 150 - 400 K/uL 149  167  136       Latest Ref Rng & Units 01/14/2024    5:38 AM  01/12/2024    8:49 PM 01/12/2024    6:00 AM  BMP  Glucose 70 - 99 mg/dL 86  409  84   BUN 8 - 23 mg/dL 33  28  20   Creatinine 0.44 - 1.00 mg/dL 8.11  9.14  7.82   Sodium 135 - 145 mmol/L 142  137  139   Potassium 3.5 - 5.1 mmol/L 4.3  4.4  3.2   Chloride 98 - 111 mmol/L 107  102  101   CO2 22 - 32 mmol/L 27  23  29    Calcium 8.9 - 10.3 mg/dL 95.6  21.3  08.6    CT PELVIS WO CONTRAST Result Date: 01/13/2024 CLINICAL DATA:  Hip trauma right hip pain fall EXAM: CT PELVIS WITHOUT CONTRAST TECHNIQUE: Multidetector CT imaging of the pelvis was performed following the standard protocol without intravenous contrast. RADIATION DOSE REDUCTION: This exam was performed according to the departmental dose-optimization program which includes automated exposure control, adjustment of the mA and/or kV according to patient size and/or use of iterative reconstruction technique. COMPARISON:  Radiograph 01/12/2024, CT 11/05/2023 FINDINGS: Urinary Tract: Partially obscured by artifact from right hip hardware. No acute abnormality Bowel: Diverticular disease of the sigmoid colon. No acute wall thickening Vascular/Lymphatic: Atherosclerosis. No aneurysm or suspicious lymph nodes Reproductive:  Negative for adnexal mass Other:  Negative for pelvic effusion Musculoskeletal: Right hip replacement with normal alignment. No definitive fracture is seen. No SI joint widening. Pubic symphysis and rami appear intact. IMPRESSION: 1. Right hip replacement with normal alignment. No definitive fracture is seen. 2. Diverticular disease of the sigmoid colon. Electronically Signed   By: Jasmine Pang M.D.   On: 01/13/2024 01:44   CT HEAD WO CONTRAST ( ) Result Date: 01/12/2024 CLINICAL DATA:  Head trauma, fell backwards and hit head. Recently started anticoagulation. EXAM: CT HEAD WITHOUT CONTRAST CT CERVICAL SPINE WITHOUT CONTRAST TECHNIQUE: Multidetector CT imaging of the head and cervical spine was performed following the standard  protocol without intravenous contrast. Multiplanar CT image reconstructions of the cervical spine were also generated. RADIATION DOSE REDUCTION: This exam was performed according to the departmental dose-optimization program which includes automated exposure control, adjustment of the mA and/or kV according to patient size and/or use of iterative reconstruction technique. COMPARISON:  CT head 11/05/2023. FINDINGS: CT HEAD FINDINGS Brain: No acute intracranial hemorrhage. No CT evidence of acute infarct. Nonspecific hypoattenuation in the periventricular and subcortical white matter favored to reflect chronic microvascular ischemic changes. No edema, mass effect, or midline shift. The basilar cisterns are patent. Ventricles: Prominence of the ventricles suggesting underlying parenchymal volume loss. Vascular: Atherosclerotic calcifications of the carotid siphons. No hyperdense vessel. Skull: No acute or aggressive finding. Orbits: Orbits are symmetric. Sinuses: The visualized paranasal sinuses are clear. Other: Mastoid air cells are clear. CT CERVICAL SPINE FINDINGS Alignment: Straightening of the normal cervical lordosis. Stepwise trace anterolisthesis of C2 on C3, C3 on C4, C4 on  C5, and C5 on C6. There is additional 3 mm anterolisthesis of C6 on C7. Trace anterolisthesis of C7 on T1. No facet subluxation or dislocation. Skull base and vertebrae: No compression fracture or displaced fracture in the cervical spine. No suspicious osseous lesion. Soft tissues and spinal canal: No prevertebral fluid or swelling. No visible canal hematoma. Disc levels: Moderate disc space narrowing at multiple levels. Disc osteophyte complexes throughout the cervical spine. There is no evidence of high-grade osseous spinal canal stenosis. Facet arthrosis and uncovertebral hypertrophy at multiple levels. Upper chest: Biapical pleuroparenchymal scarring.  Emphysema. Other: None. IMPRESSION: No CT evidence of acute intracranial  abnormality. Slightly limited evaluation of the cervical spine due to artifact and positioning. Within these limitations there is no acute fracture or traumatic malalignment appreciated. Chronic and degenerative changes as above. Emphysema (ICD10-J43.9). Electronically Signed   By: Emily Filbert M.D.   On: 01/12/2024 21:56   CT Cervical Spine Wo Contrast Result Date: 01/12/2024 CLINICAL DATA:  Head trauma, fell backwards and hit head. Recently started anticoagulation. EXAM: CT HEAD WITHOUT CONTRAST CT CERVICAL SPINE WITHOUT CONTRAST TECHNIQUE: Multidetector CT imaging of the head and cervical spine was performed following the standard protocol without intravenous contrast. Multiplanar CT image reconstructions of the cervical spine were also generated. RADIATION DOSE REDUCTION: This exam was performed according to the departmental dose-optimization program which includes automated exposure control, adjustment of the mA and/or kV according to patient size and/or use of iterative reconstruction technique. COMPARISON:  CT head 11/05/2023. FINDINGS: CT HEAD FINDINGS Brain: No acute intracranial hemorrhage. No CT evidence of acute infarct. Nonspecific hypoattenuation in the periventricular and subcortical white matter favored to reflect chronic microvascular ischemic changes. No edema, mass effect, or midline shift. The basilar cisterns are patent. Ventricles: Prominence of the ventricles suggesting underlying parenchymal volume loss. Vascular: Atherosclerotic calcifications of the carotid siphons. No hyperdense vessel. Skull: No acute or aggressive finding. Orbits: Orbits are symmetric. Sinuses: The visualized paranasal sinuses are clear. Other: Mastoid air cells are clear. CT CERVICAL SPINE FINDINGS Alignment: Straightening of the normal cervical lordosis. Stepwise trace anterolisthesis of C2 on C3, C3 on C4, C4 on C5, and C5 on C6. There is additional 3 mm anterolisthesis of C6 on C7. Trace anterolisthesis of C7 on  T1. No facet subluxation or dislocation. Skull base and vertebrae: No compression fracture or displaced fracture in the cervical spine. No suspicious osseous lesion. Soft tissues and spinal canal: No prevertebral fluid or swelling. No visible canal hematoma. Disc levels: Moderate disc space narrowing at multiple levels. Disc osteophyte complexes throughout the cervical spine. There is no evidence of high-grade osseous spinal canal stenosis. Facet arthrosis and uncovertebral hypertrophy at multiple levels. Upper chest: Biapical pleuroparenchymal scarring.  Emphysema. Other: None. IMPRESSION: No CT evidence of acute intracranial abnormality. Slightly limited evaluation of the cervical spine due to artifact and positioning. Within these limitations there is no acute fracture or traumatic malalignment appreciated. Chronic and degenerative changes as above. Emphysema (ICD10-J43.9). Electronically Signed   By: Emily Filbert M.D.   On: 01/12/2024 21:56   DG HIP UNILAT WITH PELVIS 2-3 VIEWS RIGHT Result Date: 01/12/2024 CLINICAL DATA:  Right hip pain after fall. EXAM: DG HIP (WITH OR WITHOUT PELVIS) 2-3V RIGHT COMPARISON:  None Available. FINDINGS: Right hip arthroplasty is intact. No periprosthetic fracture or lucency. No acute pelvic fracture. Pubic rami are intact. Pubic symphysis and sacroiliac joints are congruent. The bones are subjectively under mineralized. IMPRESSION: 1. No acute fracture or dislocation of the pelvis or  right hip. 2. Right hip arthroplasty without complication. Electronically Signed   By: Narda Rutherford M.D.   On: 01/12/2024 21:46    Family Communication: Discussed with patient's husband, son at bedside. They understand and agree. All questions answered.  Disposition: Status is: Observation The patient remains OBS appropriate and will d/c before 2 midnights.  Planned Discharge Destination: Skilled nursing facility     Time spent: 40 minutes  Author: Marcelino Duster,  MD 01/14/2024 1:14 PM Secure chat 7am to 7pm For on call review [email or web address].

## 2024-01-14 NOTE — Care Management Obs Status (Signed)
 MEDICARE OBSERVATION STATUS NOTIFICATION   Patient Details  Name: Tiffany Caldwell MRN: [id number] Date of Birth: November 23, 1940   Medicare Observation Status Notification Given:  Orland Dec, CMA 01/14/2024, 10:34 AM

## 2024-01-14 NOTE — Evaluation (Signed)
 Physical Therapy Evaluation Patient Details Name: Tiffany Caldwell MRN: [id number] DOB: 1941/02/15 Today's Date: 01/14/2024  History of Present Illness  Pt admitted for acute encephalopathy with complaints of fall at home. Pt with multiple recent admissions. Other PMH includes COPD, CAD, heart failure, CKD, IBS, and breast cancer.  Clinical Impression  PT/OT co-evaluation performed this date. Pt is a pleasant 83 year old female who was admitted for acute encephalopathy with complaints of falls/confusion. Pt performs bed mobility/transfers with +2 assist between surfaces and session limited due to fatigue and lethargy. Not safe to ambulate and PLOF received from spouse at bedside as pt unable to provide information. Pt demonstrates deficits with strength/mobility/cognition/balance.  Pt is not at baseline level and has potential to make improvements pending improved cognition and following commands. O2 sats at 90-92% on RA, RN cleared to leave on RA at this time. Would benefit from skilled PT to address above deficits and promote optimal return to PLOF. Pt will continue to receive skilled PT services while admitted and will defer to TOC/care team for updates regarding disposition planning.       If plan is discharge home, recommend the following: Two people to help with walking and/or transfers;Two people to help with bathing/dressing/bathroom;Help with stairs or ramp for entrance;Assist for transportation;Supervision due to cognitive status   Can travel by private vehicle   No    Equipment Recommendations  (TBD)  Recommendations for Other Services       Functional Status Assessment Patient has had a recent decline in their functional status and demonstrates the ability to make significant improvements in function in a reasonable and predictable amount of time.     Precautions / Restrictions Precautions Precautions: Fall Recall of Precautions/Restrictions: Intact Restrictions Weight  Bearing Restrictions Per Provider Order: No      Mobility  Bed Mobility Overal bed mobility: Needs Assistance Bed Mobility: Supine to Sit, Sit to Supine     Supine to sit: Max assist, +2 for safety/equipment, +2 for physical assistance Sit to supine: Max assist, +2 for physical assistance, +2 for safety/equipment   General bed mobility comments: unable to follow cues and appears scared with mobility efforts. Keeps eyes closed. Once seated at EOB, kyphotic posture noted and increased forward neck flexion noted. Needs constant min/mod assist to maintain balance    Transfers Overall transfer level: Needs assistance Equipment used: 2 person hand held assist Transfers: Bed to chair/wheelchair/BSC Sit to Stand: Max assist, +2 safety/equipment Stand pivot transfers: Total assist, +2 physical assistance         General transfer comment: pt with difficulty for following commands and gets legs tangled during transfer. Pt with incontience and begins voiding during transfer. Improved technique improving to max assist +2 for BSC->bed    Ambulation/Gait               General Gait Details: not safe  Stairs            Wheelchair Mobility     Tilt Bed    Modified Rankin (Stroke Patients Only)       Balance Overall balance assessment: Needs assistance Sitting-balance support: Feet supported Sitting balance-Leahy Scale: Zero     Standing balance support: Reliant on assistive device for balance Standing balance-Leahy Scale: Zero                               Pertinent Vitals/Pain Pain Assessment Pain Assessment: Faces Faces Pain Scale:  Hurts little more Pain Location: buttocks Pain Descriptors / Indicators: Aching Pain Intervention(s): Limited activity within patient's tolerance, Repositioned    Home Living Family/patient expects to be discharged to:: Private residence Living Arrangements: Spouse/significant other Available Help at Discharge:  Family;Available 24 hours/day Type of Home: Independent living facility Home Access: Level entry       Home Layout: One level Home Equipment: Rollator (4 wheels);Grab bars - tub/shower;Grab bars - toilet;Cane - single point Additional Comments: lives at [facility] IL[facility]    Prior Function Prior Level of Function : Independent/Modified Independent;History of Falls (last six months)             Mobility Comments: Rollator- all PLOF obtained from last admission. Pt confused and unable to answer questions at this time ADLs Comments: indep in some IADL, hired assist for food drop off/ often door dash     Extremity/Trunk Assessment   Upper Extremity Assessment Upper Extremity Assessment: Generalized weakness;Difficult to assess due to impaired cognition    Lower Extremity Assessment Lower Extremity Assessment: Generalized weakness;Difficult to assess due to impaired cognition       Communication   Communication Communication: Impaired Factors Affecting Communication: Hearing impaired    Cognition Arousal: Lethargic Behavior During Therapy: WFL for tasks assessed/performed   PT - Cognitive impairments: Difficult to assess                       PT - Cognition Comments: Pt is confused, very lethargic. Has difficulty opening eyes and maintaining alertness. Not combative or agitated this date Following commands: Impaired Following commands impaired: Follows one step commands inconsistently     Cueing Cueing Techniques: Verbal cues, Gestural cues     General Comments      Exercises Other Exercises Other Exercises: able to perform SPT from bed<>BSC to void. Pt needs total assist for hygiene/sequencing and transfer. +2 HHA noted this date.   Assessment/Plan    PT Assessment Patient needs continued PT services  PT Problem List Decreased strength;Decreased activity tolerance;Decreased balance;Decreased mobility;Decreased cognition;Decreased safety awareness;Pain        PT Treatment Interventions DME instruction;Gait training;Therapeutic exercise;Balance training    PT Goals (Current goals can be found in the Care Plan section)  Acute Rehab PT Goals Patient Stated Goal: to go home PT Goal Formulation: With family Time For Goal Achievement: 01/28/24 Potential to Achieve Goals: Fair    Frequency Min 2X/week     Co-evaluation PT/OT/SLP Co-Evaluation/Treatment: Yes Reason for Co-Treatment: Necessary to address cognition/behavior during functional activity;For patient/therapist safety;To address functional/ADL transfers PT goals addressed during session: Mobility/safety with mobility;Strengthening/ROM;Balance OT goals addressed during session: ADL's and self-care       AM-PAC PT "6 Clicks" Mobility  Outcome Measure Help needed turning from your back to your side while in a flat bed without using bedrails?: Total Help needed moving from lying on your back to sitting on the side of a flat bed without using bedrails?: Total Help needed moving to and from a bed to a chair (including a wheelchair)?: Total Help needed standing up from a chair using your arms (e.g., wheelchair or bedside chair)?: Total Help needed to walk in hospital room?: Total Help needed climbing 3-5 steps with a railing? : Total 6 Click Score: 6    End of Session Equipment Utilized During Treatment: Gait belt Activity Tolerance: Patient limited by lethargy Patient left: in bed;with call bell/phone within reach;with bed alarm set Nurse Communication: Mobility status PT Visit Diagnosis: Unsteadiness on  feet (R26.81);Muscle weakness (generalized) (M62.81);History of falling (Z91.81);Difficulty in walking, not elsewhere classified (R26.2);Pain Pain - Right/Left:  (buttocks) Pain - part of body:  (buttocks)    Time: 6213-0865 PT Time Calculation (min) (ACUTE ONLY): 33 min   Charges:   PT Evaluation $PT Eval Moderate Complexity: 1 Mod PT Treatments $Therapeutic Activity:  8-22 mins PT General Charges $$ ACUTE PT VISIT: 1 Visit         Elizabeth Palau, PT, DPT, GCS [phone]   Livvy Spilman 01/14/2024, 11:17 AM

## 2024-01-14 NOTE — Progress Notes (Addendum)
       CROSS COVER NOTE  NAME: HARLYNN KIMBELL MRN: [id number] DOB : 1940-12-05 ATTENDING PHYSICIAN: Floydene Flock, MD    Date of Service   01/14/2024   HPI/Events of Note   Messages received from RN Good evening, Mrs. Miguelina just came up from the ED. She is unresponsive to voice and only responsive to pain and movement, She has a history of a-fib and isn't on teley. Do you want to start teley? Patient refused coreg today. Heart rate jumping to 160s  Interventions   Assessment/Plan:    01/13/2024   10:03 PM 01/13/2024    8:03 PM 01/13/2024    6:49 PM  Vitals with BMI  Systolic 156 145 782  Diastolic 77 77 117  Pulse 90 86 117    Metoprolol 5 mg IV x 1, Monitor patient on tele        Donnie Mesa NP [facility] Cross Cover 7pm-7am - check amion for availability Pager [phone]

## 2024-01-15 DIAGNOSIS — R7989 Other specified abnormal findings of blood chemistry: Secondary | ICD-10-CM | POA: Diagnosis not present

## 2024-01-15 DIAGNOSIS — J449 Chronic obstructive pulmonary disease, unspecified: Secondary | ICD-10-CM | POA: Diagnosis not present

## 2024-01-15 DIAGNOSIS — R64 Cachexia: Secondary | ICD-10-CM | POA: Diagnosis not present

## 2024-01-15 DIAGNOSIS — G9341 Metabolic encephalopathy: Secondary | ICD-10-CM | POA: Diagnosis present

## 2024-01-15 DIAGNOSIS — I130 Hypertensive heart and chronic kidney disease with heart failure and stage 1 through stage 4 chronic kidney disease, or unspecified chronic kidney disease: Secondary | ICD-10-CM | POA: Diagnosis not present

## 2024-01-15 DIAGNOSIS — I480 Paroxysmal atrial fibrillation: Secondary | ICD-10-CM

## 2024-01-15 DIAGNOSIS — G629 Polyneuropathy, unspecified: Secondary | ICD-10-CM | POA: Diagnosis not present

## 2024-01-15 DIAGNOSIS — N1831 Chronic kidney disease, stage 3a: Secondary | ICD-10-CM

## 2024-01-15 DIAGNOSIS — I2510 Atherosclerotic heart disease of native coronary artery without angina pectoris: Secondary | ICD-10-CM | POA: Diagnosis not present

## 2024-01-15 DIAGNOSIS — R531 Weakness: Secondary | ICD-10-CM

## 2024-01-15 DIAGNOSIS — E860 Dehydration: Secondary | ICD-10-CM | POA: Diagnosis not present

## 2024-01-15 DIAGNOSIS — I10 Essential (primary) hypertension: Secondary | ICD-10-CM

## 2024-01-15 DIAGNOSIS — S060X0A Concussion without loss of consciousness, initial encounter: Secondary | ICD-10-CM | POA: Diagnosis not present

## 2024-01-15 DIAGNOSIS — Z79899 Other long term (current) drug therapy: Secondary | ICD-10-CM | POA: Diagnosis not present

## 2024-01-15 DIAGNOSIS — G9340 Encephalopathy, unspecified: Secondary | ICD-10-CM | POA: Diagnosis not present

## 2024-01-15 DIAGNOSIS — R278 Other lack of coordination: Secondary | ICD-10-CM | POA: Diagnosis not present

## 2024-01-15 DIAGNOSIS — Z881 Allergy status to other antibiotic agents status: Secondary | ICD-10-CM | POA: Diagnosis not present

## 2024-01-15 DIAGNOSIS — M6281 Muscle weakness (generalized): Secondary | ICD-10-CM | POA: Diagnosis not present

## 2024-01-15 DIAGNOSIS — Z741 Need for assistance with personal care: Secondary | ICD-10-CM | POA: Diagnosis not present

## 2024-01-15 DIAGNOSIS — M25551 Pain in right hip: Secondary | ICD-10-CM | POA: Diagnosis present

## 2024-01-15 DIAGNOSIS — F0390 Unspecified dementia without behavioral disturbance: Secondary | ICD-10-CM | POA: Diagnosis not present

## 2024-01-15 DIAGNOSIS — R402252 Coma scale, best verbal response, oriented, at arrival to emergency department: Secondary | ICD-10-CM | POA: Diagnosis not present

## 2024-01-15 DIAGNOSIS — I5032 Chronic diastolic (congestive) heart failure: Secondary | ICD-10-CM | POA: Diagnosis not present

## 2024-01-15 DIAGNOSIS — I2489 Other forms of acute ischemic heart disease: Secondary | ICD-10-CM | POA: Diagnosis not present

## 2024-01-15 DIAGNOSIS — R2689 Other abnormalities of gait and mobility: Secondary | ICD-10-CM | POA: Diagnosis not present

## 2024-01-15 DIAGNOSIS — Z87891 Personal history of nicotine dependence: Secondary | ICD-10-CM | POA: Diagnosis not present

## 2024-01-15 DIAGNOSIS — Z8249 Family history of ischemic heart disease and other diseases of the circulatory system: Secondary | ICD-10-CM | POA: Diagnosis not present

## 2024-01-15 DIAGNOSIS — Z888 Allergy status to other drugs, medicaments and biological substances status: Secondary | ICD-10-CM | POA: Diagnosis not present

## 2024-01-15 DIAGNOSIS — I4891 Unspecified atrial fibrillation: Secondary | ICD-10-CM | POA: Diagnosis not present

## 2024-01-15 DIAGNOSIS — E785 Hyperlipidemia, unspecified: Secondary | ICD-10-CM | POA: Diagnosis not present

## 2024-01-15 DIAGNOSIS — Z885 Allergy status to narcotic agent status: Secondary | ICD-10-CM | POA: Diagnosis not present

## 2024-01-15 DIAGNOSIS — W010XXA Fall on same level from slipping, tripping and stumbling without subsequent striking against object, initial encounter: Secondary | ICD-10-CM | POA: Diagnosis present

## 2024-01-15 DIAGNOSIS — R402362 Coma scale, best motor response, obeys commands, at arrival to emergency department: Secondary | ICD-10-CM | POA: Diagnosis not present

## 2024-01-15 DIAGNOSIS — R296 Repeated falls: Secondary | ICD-10-CM | POA: Diagnosis not present

## 2024-01-15 DIAGNOSIS — R4189 Other symptoms and signs involving cognitive functions and awareness: Secondary | ICD-10-CM | POA: Diagnosis not present

## 2024-01-15 DIAGNOSIS — R2681 Unsteadiness on feet: Secondary | ICD-10-CM | POA: Diagnosis not present

## 2024-01-15 DIAGNOSIS — R402142 Coma scale, eyes open, spontaneous, at arrival to emergency department: Secondary | ICD-10-CM | POA: Diagnosis not present

## 2024-01-15 DIAGNOSIS — Z853 Personal history of malignant neoplasm of breast: Secondary | ICD-10-CM | POA: Diagnosis not present

## 2024-01-15 DIAGNOSIS — Z951 Presence of aortocoronary bypass graft: Secondary | ICD-10-CM | POA: Diagnosis not present

## 2024-01-15 DIAGNOSIS — J439 Emphysema, unspecified: Secondary | ICD-10-CM | POA: Diagnosis not present

## 2024-01-15 MED ORDER — ACETAMINOPHEN 325 MG PO TABS
650.0000 mg | ORAL_TABLET | Freq: Four times a day (QID) | ORAL | Status: DC | PRN
  Administered 2024-01-15 – 2024-01-18 (×6): 650 mg via ORAL
  Filled 2024-01-15 (×6): qty 2

## 2024-01-15 MED ORDER — ACETAMINOPHEN 500 MG PO TABS
1000.0000 mg | ORAL_TABLET | Freq: Once | ORAL | Status: AC
  Administered 2024-01-15: 1000 mg via ORAL
  Filled 2024-01-15: qty 2

## 2024-01-15 NOTE — Progress Notes (Signed)
 Physical Therapy Treatment Patient Details Name: Tiffany Caldwell MRN: [id number] DOB: July 06, 1941 Today's Date: 01/15/2024   History of Present Illness Pt is an 83 y.o. female presented with fall, encephalopathy. Pt was discharged from hospital the day prior for A-fib with RVR and acute on chronic HFpEF, but fell going back to her living facility. PMH of COPD, CAD, chronic HFpEF, HCOM, CKD stage IIIa, chronic thrombocytopenia, pulmonary MAI, IBS, PVC/PAC, breast cancer    PT Comments  Patient is agreeable to PT session. She is able to follow single step commands consistently with increased time and multimodal cues. Patient walked in hallway with rolling walker, Min A required for steadying, turns, and advancement of rolling walker. Assistance required for transfers with increased help required without the rolling walker. Patient is fatigued with mobility. Anticipate the need for initial physical assistance with mobility after this hospital stay. Rehabilitation < 3 hours/day recommended.    If plan is discharge home, recommend the following: Two people to help with walking and/or transfers;Two people to help with bathing/dressing/bathroom;Help with stairs or ramp for entrance;Assist for transportation;Supervision due to cognitive status   Can travel by private vehicle     No  Equipment Recommendations  None recommended by PT    Recommendations for Other Services       Precautions / Restrictions Precautions Precautions: Fall Restrictions Weight Bearing Restrictions Per Provider Order: No     Mobility  Bed Mobility Overal bed mobility: Needs Assistance Bed Mobility: Supine to Sit, Sit to Supine     Supine to sit: Min assist, Mod assist, +2 for physical assistance Sit to supine: Supervision   General bed mobility comments: verbal cues for sequencing    Transfers Overall transfer level: Needs assistance Equipment used: Rolling walker (2 wheels), None Transfers: Sit to/from  Stand, Bed to chair/wheelchair/BSC Sit to Stand: Min assist, +2 physical assistance Stand pivot transfers: Mod assist, +2 physical assistance, Min assist         General transfer comment: verbal cues for technique. patient required increased assistance for standing from bed without the walker while completing step transfer to and from bed side commode    Ambulation/Gait Ambulation/Gait assistance: Min assist Gait Distance (Feet): 100 Feet Assistive device: Rolling walker (2 wheels) Gait Pattern/deviations: Step-through pattern Gait velocity: decreased     General Gait Details: patient required steadying assistance for ambulation with rolling walker, especailly with turns. intermittent assistance for rolling walker advancement initially. increased trunk flexion with fatigue   Stairs             Wheelchair Mobility     Tilt Bed    Modified Rankin (Stroke Patients Only)       Balance Overall balance assessment: Needs assistance Sitting-balance support: Feet supported Sitting balance-Leahy Scale: Poor   Postural control: Posterior lean Standing balance support: Bilateral upper extremity supported Standing balance-Leahy Scale: Zero Standing balance comment: external support required to maintain standing balance                            Communication Communication Communication: Impaired Factors Affecting Communication: Hearing impaired  Cognition Arousal: Alert Behavior During Therapy: WFL for tasks assessed/performed   PT - Cognitive impairments: History of cognitive impairments                       PT - Cognition Comments: Patient is alert, following commands with increased time. Intermittent difficulty with sequencing with mobility tasks, multi  modal cues required Following commands: Impaired Following commands impaired: Follows one step commands with increased time    Cueing Cueing Techniques: Verbal cues, Gestural cues   Exercises      General Comments General comments (skin integrity, edema, etc.): kyphotic posture throughout session. vitals stable throughout      Pertinent Vitals/Pain Pain Assessment Pain Assessment: Faces Faces Pain Scale: Hurts a little bit Pain Location: back Pain Descriptors / Indicators: Sore Pain Intervention(s): Repositioned, Limited activity within patient's tolerance, Monitored during session    Home Living                          Prior Function            PT Goals (current goals can now be found in the care plan section) Acute Rehab PT Goals Patient Stated Goal: to go home PT Goal Formulation: With family Time For Goal Achievement: 01/28/24 Potential to Achieve Goals: Fair Progress towards PT goals: Progressing toward goals    Frequency    Min 2X/week      PT Plan      Co-evaluation PT/OT/SLP Co-Evaluation/Treatment: Yes Reason for Co-Treatment: Necessary to address cognition/behavior during functional activity;For patient/therapist safety;To address functional/ADL transfers PT goals addressed during session: Mobility/safety with mobility;Strengthening/ROM;Balance OT goals addressed during session: ADL's and self-care      AM-PAC PT "6 Clicks" Mobility   Outcome Measure  Help needed turning from your back to your side while in a flat bed without using bedrails?: A Lot Help needed moving from lying on your back to sitting on the side of a flat bed without using bedrails?: A Lot Help needed moving to and from a bed to a chair (including a wheelchair)?: A Lot Help needed standing up from a chair using your arms (e.g., wheelchair or bedside chair)?: A Lot Help needed to walk in hospital room?: A Lot Help needed climbing 3-5 steps with a railing? : Total 6 Click Score: 11    End of Session   Activity Tolerance: Patient tolerated treatment well Patient left: in bed;with call bell/phone within reach;with bed alarm set;with family/visitor  present (son and spouse present) Nurse Communication: Mobility status PT Visit Diagnosis: Unsteadiness on feet (R26.81);Muscle weakness (generalized) (M62.81);History of falling (Z91.81);Difficulty in walking, not elsewhere classified (R26.2);Pain     Time: 1335-1400 PT Time Calculation (min) (ACUTE ONLY): 25 min  Charges:    $Therapeutic Activity: 8-22 mins PT General Charges $$ ACUTE PT VISIT: 1 Visit                    Donna Bernard, PT, MPT    Ina Homes 01/15/2024, 2:25 PM

## 2024-01-15 NOTE — Progress Notes (Signed)
 Occupational Therapy Treatment Patient Details Name: Tiffany Caldwell MRN: [id number] DOB: 10-Jul-1941 Today's Date: 01/15/2024   History of present illness Pt is an 83 y.o. female presented with fall, encephalopathy. Pt was discharged from hospital the day prior for A-fib with RVR and acute on chronic HFpEF, but fell going back to her living facility. PMH of COPD, CAD, chronic HFpEF, HCOM, CKD stage IIIa, chronic thrombocytopenia, pulmonary MAI, IBS, PVC/PAC, breast cancer   OT comments  Pt is supine in bed on arrival. Pleasant and agreeable to PT/OT co-tx session. She reports minimal pain in her back. Pt performed bed mobility with Min/Mod A x2 and multi modal cueing for sequencing, initiation and hand placement on bed rail. Pt required Min A X2 for STS to RW from EOB and progressed mobility out into hallway with Min A x1 and CGA x1 for lines/leads management and safety. Seated rest break, then SPT to [facility] without RW requiring Min/Mod A X2 for safety, sequencing. Able to perform peri-care with CGA seated on BSC. SUP to return to supine in bed. Provided therabands and exercise handout to son to maximize her BUE strength.  Pt returned to bed with all needs in place and will cont to require skilled acute OT services to maximize her safety and IND to return to PLOF.       If plan is discharge home, recommend the following:  A lot of help with bathing/dressing/bathroom;Two people to help with bathing/dressing/bathroom;Assistance with cooking/housework;Help with stairs or ramp for entrance;Direct supervision/assist for medications management;Direct supervision/assist for financial management;Supervision due to cognitive status;A lot of help with walking and/or transfers   Equipment Recommendations  Other (comment) (defer)    Recommendations for Other Services      Precautions / Restrictions Precautions Precautions: Fall Restrictions Weight Bearing Restrictions Per Provider Order: No        Mobility Bed Mobility Overal bed mobility: Needs Assistance Bed Mobility: Supine to Sit, Sit to Supine     Supine to sit: Min assist, Mod assist, +2 for physical assistance Sit to supine: Supervision   General bed mobility comments: cues for sequencing/initiation and hand placement on bed rail    Transfers Overall transfer level: Needs assistance Equipment used: Rolling walker (2 wheels), None Transfers: Sit to/from Stand, Bed to chair/wheelchair/BSC Sit to Stand: Min assist, +2 physical assistance Stand pivot transfers: Mod assist, +2 physical assistance, Min assist         General transfer comment: cues for technique and hand placement, more difficulty with SPT without RW use able to perform STS to RW with Min A X2 and progress mobility with Min A x1 and SBA x1 for lines/leads management     Balance Overall balance assessment: Needs assistance Sitting-balance support: Feet supported Sitting balance-Leahy Scale: Fair Sitting balance - Comments: CGA to SBA for seated balance at EOB and on BSC for hygiene   Standing balance support: Bilateral upper extremity supported, Reliant on assistive device for balance Standing balance-Leahy Scale: Poor Standing balance comment: RW and +1 to maintain balance                           ADL either performed or assessed with clinical judgement   ADL Overall ADL's : Needs assistance/impaired                         Toilet Transfer: +2 for physical assistance;Moderate assistance;Cueing for safety;Cueing for sequencing;Stand-pivot;BSC/3in1   Toileting- Clothing  Manipulation and Hygiene: Contact guard assist;Sitting/lateral lean         General ADL Comments: alert and improved ability to follow commands today, Mod A x2  for SPT to [facility] without RW, cueing for hand placement and weight shift/turn    Extremity/Trunk Assessment              Vision       Perception     Praxis     Communication  Communication Communication: Impaired Factors Affecting Communication: Hearing impaired   Cognition Arousal: Alert Behavior During Therapy: WFL for tasks assessed/performed                                 Following commands: Impaired Following commands impaired: Follows one step commands with increased time      Cueing   Cueing Techniques: Verbal cues, Gestural cues  Exercises      Shoulder Instructions       General Comments VSS throughout    Pertinent Vitals/ Pain       Pain Assessment Pain Assessment: Faces Faces Pain Scale: Hurts a little bit Pain Location: back Pain Descriptors / Indicators: Sore Pain Intervention(s): Monitored during session, Repositioned  Home Living                                          Prior Functioning/Environment              Frequency  Min 2X/week        Progress Toward Goals  OT Goals(current goals can now be found in the care plan section)  Progress towards OT goals: Progressing toward goals  Acute Rehab OT Goals Patient Stated Goal: improve function OT Goal Formulation: With family Time For Goal Achievement: 01/28/24 Potential to Achieve Goals: Good  Plan      Co-evaluation    PT/OT/SLP Co-Evaluation/Treatment: Yes Reason for Co-Treatment: Necessary to address cognition/behavior during functional activity;For patient/therapist safety;To address functional/ADL transfers PT goals addressed during session: Mobility/safety with mobility;Strengthening/ROM;Balance OT goals addressed during session: ADL's and self-care      AM-PAC OT "6 Clicks" Daily Activity     Outcome Measure   Help from another person eating meals?: A Little Help from another person taking care of personal grooming?: A Little Help from another person toileting, which includes using toliet, bedpan, or urinal?: A Lot Help from another person bathing (including washing, rinsing, drying)?: A Lot Help from another  person to put on and taking off regular upper body clothing?: A Little Help from another person to put on and taking off regular lower body clothing?: A Lot 6 Click Score: 15    End of Session Equipment Utilized During Treatment: Gait belt;Rolling walker (2 wheels)  OT Visit Diagnosis: Unsteadiness on feet (R26.81);Other abnormalities of gait and mobility (R26.89);Repeated falls (R29.6);Muscle weakness (generalized) (M62.81)   Activity Tolerance Patient tolerated treatment well   Patient Left in bed;with family/visitor present;with bed alarm set;with call bell/phone within reach   Nurse Communication Mobility status        Time: 1335-1401 OT Time Calculation (min): 26 min  Charges: OT General Charges $OT Visit: 1 Visit OT Treatments $Self Care/Home Management : 8-22 mins  Tiffany Caldwell, OTR/L  01/15/24, 3:17 PM   Tiffany Caldwell 01/15/2024, 3:14 PM

## 2024-01-15 NOTE — TOC Initial Note (Signed)
 Transition of Care [facility]) - Initial/Assessment Note    Patient Details  Name: Tiffany Caldwell MRN: [id number] Date of Birth: 1940/11/21  Transition of Care [facility]) CM/SW Contact:    Cherre Blanc, RN Phone Number: 01/15/2024, 1:55 PM  Clinical Narrative:                 TOC spoke with the patient's husband Iantha Fallen. He and his wife are from Independent Living at [facility]. PT is recommending SNF at discharge. Iantha Fallen would like for the patient to go to the SNF at [facility]. FL2 sent to [facility].   TOC will continue to outreach.  Expected Discharge Plan: Skilled Nursing Facility Barriers to Discharge: Continued Medical Work up   Patient Goals and CMS Choice            Expected Discharge Plan and Services   Discharge Planning Services: CM Consult   Living arrangements for the past 2 months: Independent Living Facility                                      Prior Living Arrangements/Services Living arrangements for the past 2 months: Independent Living Facility Lives with:: Spouse Patient language and need for interpreter reviewed:: No Do you feel safe going back to the place where you live?: Yes               Activities of Daily Living   ADL Screening (condition at time of admission) Independently performs ADLs?: No Does the patient have a NEW difficulty with bathing/dressing/toileting/self-feeding that is expected to last >3 days?: Yes (Initiates electronic notice to provider for possible OT consult) Does the patient have a NEW difficulty with getting in/out of bed, walking, or climbing stairs that is expected to last >3 days?: Yes (Initiates electronic notice to provider for possible PT consult) Does the patient have a NEW difficulty with communication that is expected to last >3 days?: Yes (Initiates electronic notice to provider for possible SLP consult) Is the patient deaf or have difficulty hearing?: No Does the patient have difficulty seeing,  even when wearing glasses/contacts?: No Does the patient have difficulty concentrating, remembering, or making decisions?: Yes  Permission Sought/Granted                  Emotional Assessment Appearance:: Appears stated age     Orientation: : Oriented to Self   Psych Involvement: No (comment)  Admission diagnosis:  Bilateral hip pain [M25.551, M25.552] Acute encephalopathy [G93.40] Fall, initial encounter [W19.XXXA] Traumatic injury of head, initial encounter [S09.90XA] Patient Active Problem List   Diagnosis Date Noted   Acute encephalopathy 01/13/2024   Chronic heart failure with preserved ejection fraction (HFpEF) (HCC) 01/13/2024   Elevated troponin 01/13/2024   Paroxysmal A-fib (HCC) 01/11/2024   Afib (HCC) 01/11/2024   Acute on chronic respiratory failure with hypoxia (HCC) 11/07/2023   SIRS (systemic inflammatory response syndrome) (HCC) 11/06/2023   Myocardial injury 11/06/2023   Frequent PVCs 11/05/2023   Acute on chronic diastolic CHF (congestive heart failure) (HCC) 11/05/2023   Chronic kidney disease, stage 3a (HCC) 11/05/2023   Thrombocytopenia (HCC) 11/05/2023   Gallstone 11/05/2023   Acute metabolic encephalopathy 11/05/2023   Pulmonary hypertension (HCC) 06/17/2023   PVC (premature ventricular contraction) 06/06/2021   Changing skin lesion 10/19/2018   PAC (premature atrial contraction) 09/10/2018   Pinched nerve    Neuropathy    Lung  disorder    Irritable bowel syndrome (IBS)    Hyperlipidemia LDL goal <70    GERD (gastroesophageal reflux disease)    Emphysema lung (HCC)    Dyspnea on exertion    DDD (degenerative disc disease), lumbosacral    Breast cancer (HCC)    Arthritis    Allergy    Labile hypertension    Fever of unknown origin    Hypoxemia    Somnolence    Primary localized osteoarthritis of right hip 08/05/2016   Primary osteoarthritis of right hip 08/05/2016   Pulmonary Mycobacterium avium infection (HCC) 05/01/2015   Coronary  artery disease 05/01/2015   Essential hypertension 05/01/2015   Dyslipidemia 05/01/2015   Right lower lobe lung mass 12/14/2014   COPD (chronic obstructive pulmonary disease) (HCC) 12/14/2014   Pneumonia 10/06/2014   History of breast cancer 07/25/2011   Gallstones 10/31/2010   CHRONIC RHINOSINUSITIS 09/24/2010   External hemorrhoids 09/23/2010   Diverticulosis of colon 09/23/2010   PCP:  Delma Officer, PA Pharmacy:   [facility] [phone] [place], [place] - [address] [place] [address] Phone: [phone] Fax: [phone]     Social Drivers of Health (SDOH) Social History: SDOH Screenings   Food Insecurity: No Food Insecurity (01/14/2024)  Housing: Low Risk  (01/14/2024)  Transportation Needs: No Transportation Needs (01/14/2024)  Utilities: Not At Risk (01/14/2024)  Social Connections: Socially Integrated (01/14/2024)  Tobacco Use: Medium Risk (01/13/2024)   SDOH Interventions:     Readmission Risk Interventions     No data to display

## 2024-01-15 NOTE — Progress Notes (Signed)
 Progress Note   Patient: Tiffany Caldwell DOB: 1940/12/16 DOA: 01/12/2024     0 DOS: the patient was seen and examined on 01/15/2024   Brief hospital course: Tiffany Caldwell is a 83 y.o. female with medical history significant of COPD, CAD, chronic HFpEF, HCOM, CKD stage IIIa, chronic thrombocytopenia, pulmonary MAI, IBS, PVC/PAC, breast cancer, recently discharged after treating for Afib, heart failure presented with fall, encephalopathy.   Assessment and Plan: * Acute encephalopathy S/p Fall Generalized weakness Suspect likely multifactorial with contributions of dehydration, recurrent falls and/or concussion. She is more sleepy but able to answer me this morning. Family at bedside stated that her mental status deteriorated in the last couple of days though she does have undiagnosed dementia. Notable multiple fall, head traumas over the past 2 to 3 weeks. Imaging unremarkable.  Nonfocal neuroexam. No overt infection noted. Clinically dry, gentle IV fluids for today. She is not eating.  Avoid sedatives, IV pain medications, gabapentin. Fall, aspiration precautions PT OT follow-up for dc plan, she may need SNF. Monitor neurochecks closely.  COPD (chronic obstructive pulmonary disease) (HCC) Stable from a respiratory standpoint Continue home inhalers  Essential hypertension BP stable. Continue coreg therapy.  Chronic kidney disease, stage 3a (HCC) Creatinine 1.03 with GFR in the 50s, stable.  Elevated troponin Trop 100s-->70s in setting of fall  Suspect minimal to mild demand ischemia  Downtrending troponin without chest pain   Chronic heart failure with preserved ejection fraction (HFpEF) (HCC) 2D echo August 2024 with EF of 55 to 60% and grade 2 diastolic dysfunction. Clinically dry on presentation Gentle IV fluid hydration if not eating. Watch for fluid overload.  Afib (HCC) Based on paroxysmal atrial fibrillation with recent evaluation/admission for  similar issues. Continue Coreg On low-dose Eliquis.  Will discuss with family regarding stopping anticoagulation long-term given fall risk.     Out of bed to chair. Incentive spirometry. Nursing supportive care. Fall, aspiration precautions. Diet:  Diet Orders (From admission, onward)     Start     Ordered   01/14/24 0844  Diet 2 gram sodium Fluid consistency: Thin  Diet effective now       Question:  Fluid consistency:  Answer:  Thin   01/14/24 0843           DVT prophylaxis: apixaban (ELIQUIS) tablet 2.5 mg Start: 01/14/24 1000 apixaban (ELIQUIS) tablet 2.5 mg  Level of care: Telemetry Medical   Code Status: Full Code  Subjective: Patient is seen and examined today morning. She was able to tell me her name, date of birth this morning. Later she was sleepy upon family arrival.   Did talk to RN she was able to eat a little and seems more alert, worked with PT  Physical Exam: Vitals:   01/15/24 0509 01/15/24 0607 01/15/24 0747 01/15/24 1209  BP: (!) 197/93 (!) 165/86 (!) 145/70 (!) 126/56  Pulse: 60 70 65 (!) 56  Resp: 19  17 19   Temp: 98.5 F (36.9 C)  98.2 F (36.8 C) 97.8 F (36.6 C)  TempSrc:    Oral  SpO2: 93%  94% 94%  Weight:      Height:        General - Elderly thin built Caucasian female, sleeping HEENT - PERRLA, EOMI, atraumatic head, non tender sinuses. Lung - Clear, basal rales, rhonchi, wheezes. Heart - S1, S2 heard, no murmurs, rubs, trace pedal edema. Abdomen - Soft, non tender, bowel sounds good Neuro -Sleepy, unable to do full neuro exam.  Skin - Warm and dry.  Data Reviewed:      Latest Ref Rng & Units 01/14/2024    5:38 AM 01/12/2024    8:49 PM 01/11/2024   12:43 PM  CBC  WBC 4.0 - 10.5 K/uL 6.5  5.1  5.6   Hemoglobin 12.0 - 15.0 g/dL 16.1  09.6  04.5   Hematocrit 36.0 - 46.0 % 44.9  47.5  44.7   Platelets 150 - 400 K/uL 149  167  136       Latest Ref Rng & Units 01/14/2024    5:38 AM 01/12/2024    8:49 PM 01/12/2024    6:00 AM  BMP   Glucose 70 - 99 mg/dL 86  409  84   BUN 8 - 23 mg/dL 33  28  20   Creatinine 0.44 - 1.00 mg/dL 8.11  9.14  7.82   Sodium 135 - 145 mmol/L 142  137  139   Potassium 3.5 - 5.1 mmol/L 4.3  4.4  3.2   Chloride 98 - 111 mmol/L 107  102  101   CO2 22 - 32 mmol/L 27  23  29    Calcium 8.9 - 10.3 mg/dL 95.6  21.3  08.6    No results found.   Family Communication: Discussed with patient's husband, son at bedside. They understand and agree. All questions answered.  Disposition: Status is: Observation The patient will require care spanning > 2 midnights and should be moved to inpatient because: acute encephalopathy, poor oral intake.  Planned Discharge Destination: Skilled nursing facility     Time spent: 41 minutes  Author: Marcelino Duster, MD 01/15/2024 1:49 PM Secure chat 7am to 7pm For on call review [email or web address].

## 2024-01-15 NOTE — NC FL2 (Signed)
 [place] MEDICAID [facility] LEVEL OF CARE FORM     IDENTIFICATION  Patient Name: Tiffany Caldwell Birthdate: 07/22/1941 Sex: female Admission Date (Current Location): 01/12/2024  [place] and [place] Number:  Chiropodist and Address:  [facility], [address] [place], [place] [address]      Provider Number: [id number]  Attending Physician Name and Address:  Marcelino Duster, MD  Relative Name and Phone Number:  Iantha Fallen [phone]    Current Level of Care: Other (Comment) Recommended Level of Care: Skilled Nursing Facility Prior Approval Number:    Date Approved/Denied:   PASRR Number: [id number]  Discharge Plan: SNF    Current Diagnoses: Patient Active Problem List   Diagnosis Date Noted   Acute encephalopathy 01/13/2024   Chronic heart failure with preserved ejection fraction (HFpEF) (HCC) 01/13/2024   Elevated troponin 01/13/2024   Paroxysmal A-fib (HCC) 01/11/2024   Afib (HCC) 01/11/2024   Acute on chronic respiratory failure with hypoxia (HCC) 11/07/2023   SIRS (systemic inflammatory response syndrome) (HCC) 11/06/2023   Myocardial injury 11/06/2023   Frequent PVCs 11/05/2023   Acute on chronic diastolic CHF (congestive heart failure) (HCC) 11/05/2023   Chronic kidney disease, stage 3a (HCC) 11/05/2023   Thrombocytopenia (HCC) 11/05/2023   Gallstone 11/05/2023   Acute metabolic encephalopathy 11/05/2023   Pulmonary hypertension (HCC) 06/17/2023   PVC (premature ventricular contraction) 06/06/2021   Changing skin lesion 10/19/2018   PAC (premature atrial contraction) 09/10/2018   Pinched nerve    Neuropathy    Lung disorder    Irritable bowel syndrome (IBS)    Hyperlipidemia LDL goal <70    GERD (gastroesophageal reflux disease)    Emphysema lung (HCC)    Dyspnea on exertion    DDD (degenerative disc disease), lumbosacral    Breast cancer (HCC)    Arthritis    Allergy    Labile hypertension    Fever of unknown  origin    Hypoxemia    Somnolence    Primary localized osteoarthritis of right hip 08/05/2016   Primary osteoarthritis of right hip 08/05/2016   Pulmonary Mycobacterium avium infection (HCC) 05/01/2015   Coronary artery disease 05/01/2015   Essential hypertension 05/01/2015   Dyslipidemia 05/01/2015   Right lower lobe lung mass 12/14/2014   COPD (chronic obstructive pulmonary disease) (HCC) 12/14/2014   Pneumonia 10/06/2014   History of breast cancer 07/25/2011   Gallstones 10/31/2010   CHRONIC RHINOSINUSITIS 09/24/2010   External hemorrhoids 09/23/2010   Diverticulosis of colon 09/23/2010    Orientation RESPIRATION BLADDER Height & Weight     Self, Place    Incontinent Weight: 53.8 kg Height:  5\' 3"  (160 cm)  BEHAVIORAL SYMPTOMS/MOOD NEUROLOGICAL BOWEL NUTRITION STATUS      Incontinent Diet (low sodium)  AMBULATORY STATUS COMMUNICATION OF NEEDS Skin   Extensive Assist Verbally                         Personal Care Assistance Level of Assistance  Bathing, Feeding, Dressing Bathing Assistance: Limited assistance Feeding assistance: Limited assistance Dressing Assistance: Limited assistance     Functional Limitations Info             SPECIAL CARE FACTORS FREQUENCY  PT (By licensed PT), OT (By licensed OT)     PT Frequency: 5 x week OT Frequency: 5 x week            Contractures      Additional Factors Info  Code Status,  Allergies Code Status Info: FULL Allergies Info: Amlodipine Hydromorphone Azithromycin Ciprofloxacin Hydromorphone Levofloxacin Lisinopril           Current Medications (01/15/2024):  This is the current hospital active medication list Current Facility-Administered Medications  Medication Dose Route Frequency Provider Last Rate Last Admin   0.9 %  sodium chloride infusion   Intravenous Continuous Marcelino Duster, MD 50 mL/hr at 01/15/24 0911 New Bag at 01/15/24 0911   apixaban (ELIQUIS) tablet 2.5 mg  2.5 mg Oral BID Floydene Flock, MD   2.5 mg at 01/15/24 0900   atorvastatin (LIPITOR) tablet 20 mg  20 mg Oral Daily Floydene Flock, MD       carvedilol (COREG) tablet 12.5 mg  12.5 mg Oral BID WC Floydene Flock, MD   12.5 mg at 01/15/24 0900   feeding supplement (ENSURE ENLIVE / ENSURE PLUS) liquid 237 mL  237 mL Oral BID BM Marcelino Duster, MD   237 mL at 01/15/24 0901   hydrALAZINE (APRESOLINE) injection 10 mg  10 mg Intravenous Q4H PRN Floydene Flock, MD   10 mg at 01/15/24 0512   LORazepam (ATIVAN) injection 0.5 mg  0.5 mg Intravenous Q4H PRN Floydene Flock, MD   0.5 mg at 01/13/24 1547   morphine (PF) 4 MG/ML injection 4 mg  4 mg Intravenous Q2H PRN Floydene Flock, MD   4 mg at 01/13/24 1016   OLANZapine (ZYPREXA) injection 2.5 mg  2.5 mg Intramuscular Q6H PRN Floydene Flock, MD   2.5 mg at 01/13/24 1634   ondansetron (ZOFRAN) tablet 4 mg  4 mg Oral Q6H PRN Floydene Flock, MD       Or   ondansetron [facility]) injection 4 mg  4 mg Intravenous Q6H PRN Floydene Flock, MD         Discharge Medications: Please see discharge summary for a list of discharge medications.  Relevant Imaging Results:  Relevant Lab Results:   Additional Information [id number]  Cherre Blanc, RN

## 2024-01-15 NOTE — Plan of Care (Signed)

## 2024-01-16 DIAGNOSIS — I10 Essential (primary) hypertension: Secondary | ICD-10-CM | POA: Diagnosis not present

## 2024-01-16 DIAGNOSIS — G9340 Encephalopathy, unspecified: Secondary | ICD-10-CM | POA: Diagnosis not present

## 2024-01-16 DIAGNOSIS — J449 Chronic obstructive pulmonary disease, unspecified: Secondary | ICD-10-CM | POA: Diagnosis not present

## 2024-01-16 DIAGNOSIS — I480 Paroxysmal atrial fibrillation: Secondary | ICD-10-CM | POA: Diagnosis not present

## 2024-01-16 NOTE — TOC Progression Note (Addendum)
 Transition of Care [facility]) - Progression Note    Patient Details  Name: KENLEI SAFI MRN: [id number] Date of Birth: [birth year]  Transition of Care [facility]) CM/SW Contact  Wlliam Grosso E Jazion Atteberry, LCSW Phone Number: 01/16/2024, 3:14 PM  Clinical Narrative:    Checked with MD who states patient is medically ready for insurance auth to be started. CSW called [employer]  and spoke with Tammy. Started auth for [employer] [facility] and [facility].   Expected Discharge Plan: Skilled Nursing Facility Barriers to Discharge: Continued Medical Work up  Expected Discharge Plan and Services   Discharge Planning Services: CM Consult   Living arrangements for the past 2 months: Independent Living Facility                                       Social Determinants of Health (SDOH) Interventions SDOH Screenings   Food Insecurity: No Food Insecurity (01/14/2024)  Housing: Low Risk  (01/14/2024)  Transportation Needs: No Transportation Needs (01/14/2024)  Utilities: Not At Risk (01/14/2024)  Social Connections: Socially Integrated (01/14/2024)  Tobacco Use: Medium Risk (01/13/2024)    Readmission Risk Interventions     No data to display

## 2024-01-16 NOTE — Progress Notes (Signed)
 Progress Note   Patient: Tiffany Caldwell ZOX:[id number] DOB: 07/19/1941 DOA: 01/12/2024     1 DOS: the patient was seen and examined on 01/16/2024   Brief hospital course: KEISHIA GROUND is a 83 y.o. female with medical history significant of COPD, CAD, chronic HFpEF, HCOM, CKD stage IIIa, chronic thrombocytopenia, pulmonary MAI, IBS, PVC/PAC, breast cancer, recently discharged after treating for Afib, heart failure presented with fall, encephalopathy.   Assessment and Plan: * Acute encephalopathy S/p Fall Generalized weakness Suspect likely multifactorial with contributions of dehydration, recurrent falls and/or concussion. Imaging unremarkable.  Nonfocal neuroexam. No overt infection noted. She is more alert today and able to answer me. She is eating better, IV fluids stopped. Avoid sedatives, IV pain medications, gabapentin. Fall, aspiration precautions PT OT advised SNF. TOC working on [employer]. Monitor neurochecks closely.  COPD (chronic obstructive pulmonary disease) (HCC) Stable from a respiratory standpoint Continue home inhalers  Essential hypertension BP stable. Continue coreg therapy.  Chronic kidney disease, stage 3a (HCC) Creatinine baseline. Avoid nephrotoxic drugs.  Elevated troponin Trop 100s-->70s in setting of fall  Suspect minimal to mild demand ischemia  Downtrending troponin without chest pain   Chronic heart failure with preserved ejection fraction (HFpEF) (HCC) 2D echo August 2024 with EF of 55 to 60% and grade 2 diastolic dysfunction. Clinically dry on presentation received gentle IV fluids which are stopped now.  Afib (HCC) Continue Coreg On low-dose Eliquis.  Will discuss with family regarding stopping anticoagulation long-term given fall risk.     Out of bed to chair. Incentive spirometry. Nursing supportive care. Fall, aspiration precautions. Diet:  Diet Orders (From admission, onward)     Start     Ordered   01/14/24 0844  Diet 2  gram sodium Fluid consistency: Thin  Diet effective now       Question:  Fluid consistency:  Answer:  Thin   01/14/24 0843           DVT prophylaxis: apixaban (ELIQUIS) tablet 2.5 mg Start: 01/14/24 1000 apixaban (ELIQUIS) tablet 2.5 mg  Level of care: Telemetry Medical   Code Status: Full Code  Subjective: Patient is seen and examined today morning. She is sitting in chair. More alert, awake. Feels weak. Eating better today. No family present at bedside.  Physical Exam: Vitals:   01/15/24 1702 01/15/24 2106 01/16/24 0637 01/16/24 0825  BP: 120/68 121/65 (!) 154/106 (!) 184/96  Pulse: (!) 55 (!) 53 (!) 48   Resp: 16 20 18 18   Temp: 98.2 F (36.8 C) 98 F (36.7 C) 97.9 F (36.6 C) (!) 97.5 F (36.4 C)  TempSrc: Oral     SpO2: 93% 95% 92% 96%  Weight:      Height:        General - Elderly thin built Caucasian female, sitting in no distress. HEENT - PERRLA, EOMI, atraumatic head, non tender sinuses. Lung - Clear, basal rales, rhonchi, wheezes. Heart - S1, S2 heard, no murmurs, rubs, trace pedal edema. Abdomen - Soft, non tender, bowel sounds good Neuro -Alert, awake and oriented x3, non focal neuro exam. Skin - Warm and dry.  Data Reviewed:      Latest Ref Rng & Units 01/14/2024    5:38 AM 01/12/2024    8:49 PM 01/11/2024   12:43 PM  CBC  WBC 4.0 - 10.5 K/uL 6.5  5.1  5.6   Hemoglobin 12.0 - 15.0 g/dL 81.1  91.4  78.2   Hematocrit 36.0 - 46.0 % 44.9  47.5  44.7   Platelets 150 - 400 K/uL 149  167  136       Latest Ref Rng & Units 01/14/2024    5:38 AM 01/12/2024    8:49 PM 01/12/2024    6:00 AM  BMP  Glucose 70 - 99 mg/dL 86  161  84   BUN 8 - 23 mg/dL 33  28  20   Creatinine 0.44 - 1.00 mg/dL 0.96  0.45  4.09   Sodium 135 - 145 mmol/L 142  137  139   Potassium 3.5 - 5.1 mmol/L 4.3  4.4  3.2   Chloride 98 - 111 mmol/L 107  102  101   CO2 22 - 32 mmol/L 27  23  29    Calcium 8.9 - 10.3 mg/dL 81.1  91.4  78.2    No results found.   Family Communication:  Discussed with patient's husband, son at bedside. They understand and agree. All questions answered.  Disposition: Status is: inpatient because: acute encephalopathy, poor oral intake.  Planned Discharge Destination: Skilled nursing facility     Time spent: 39 minutes  Author: Aisha Hove, MD 01/16/2024 2:58 PM Secure chat 7am to 7pm For on call review [email or web address].

## 2024-01-17 DIAGNOSIS — J449 Chronic obstructive pulmonary disease, unspecified: Secondary | ICD-10-CM | POA: Diagnosis not present

## 2024-01-17 DIAGNOSIS — I10 Essential (primary) hypertension: Secondary | ICD-10-CM | POA: Diagnosis not present

## 2024-01-17 DIAGNOSIS — N1831 Chronic kidney disease, stage 3a: Secondary | ICD-10-CM | POA: Diagnosis not present

## 2024-01-17 DIAGNOSIS — G9340 Encephalopathy, unspecified: Secondary | ICD-10-CM | POA: Diagnosis not present

## 2024-01-17 NOTE — Plan of Care (Signed)
°  Problem: Clinical Measurements: Goal: Will remain free from infection Outcome: Progressing   Problem: Activity: Goal: Risk for activity intolerance will decrease Outcome: Progressing   Problem: Nutrition: Goal: Adequate nutrition will be maintained Outcome: Progressing

## 2024-01-17 NOTE — Plan of Care (Signed)
   Problem: Activity: Goal: Risk for activity intolerance will decrease Outcome: Progressing   Problem: Pain Managment: Goal: General experience of comfort will improve and/or be controlled Outcome: Progressing   Problem: Safety: Goal: Ability to remain free from injury will improve Outcome: Progressing

## 2024-01-17 NOTE — TOC Progression Note (Signed)
 Transition of Care [facility]) - Progression Note    Patient Details  Name: SMRITI BARKOW MRN: [id number] Date of Birth: 04-20-1941  Transition of Care [facility]) CM/SW Contact  Areta Beer, RN Phone Number: 01/17/2024, 10:32 AM  Clinical Narrative:  4/13: [facility] SN[facility] insurance authorization number [id number] for SNF, good for 7 days. Ambulance authorization is pending per Earla Glassman at [facility]. [id number].    Katheryn Pandy MSN RN CM  RN Case Manager [place]  Transitions of Care Direct Dial: [phone] (Weekends Only) [facility] Main Office Phone: [phone] [facility] Fax: [phone] [email or web address]      Expected Discharge Plan: Skilled Nursing Facility Barriers to Discharge: Continued Medical Work up  Expected Discharge Plan and Services   Discharge Planning Services: CM Consult   Living arrangements for the past 2 months: Independent Living Facility                                       Social Determinants of Health (SDOH) Interventions SDOH Screenings   Food Insecurity: No Food Insecurity (01/14/2024)  Housing: Low Risk  (01/14/2024)  Transportation Needs: No Transportation Needs (01/14/2024)  Utilities: Not At Risk (01/14/2024)  Social Connections: Socially Integrated (01/14/2024)  Tobacco Use: Medium Risk (01/13/2024)    Readmission Risk Interventions     No data to display

## 2024-01-17 NOTE — Plan of Care (Signed)
  Problem: Activity: Goal: Risk for activity intolerance will decrease Outcome: Progressing   Problem: Pain Managment: Goal: General experience of comfort will improve and/or be controlled Outcome: Progressing   Problem: Safety: Goal: Ability to remain free from injury will improve Outcome: Progressing   Problem: Skin Integrity: Goal: Risk for impaired skin integrity will decrease Outcome: Progressing

## 2024-01-17 NOTE — Progress Notes (Signed)
 Progress Note   Patient: Tiffany Caldwell HYQ:[id number] DOB: 04-Apr-1941 DOA: 01/12/2024     2 DOS: the patient was seen and examined on 01/17/2024   Brief hospital course: Tiffany Caldwell is a 83 y.o. female with medical history significant of COPD, CAD, chronic HFpEF, HCOM, CKD stage IIIa, chronic thrombocytopenia, pulmonary MAI, IBS, PVC/PAC, breast cancer, recently discharged after treating for Afib, heart failure presented with fall, encephalopathy.   Assessment and Plan: * Acute encephalopathy S/p Fall Generalized weakness Suspect likely multifactorial with contributions of dehydration, recurrent falls and/or concussion. Imaging unremarkable.  Nonfocal neuroexam. No overt infection noted. She is alert today and able to clearly answer Her oral intake is improved Avoid sedatives, IV pain medications, gabapentin. Fall, aspiration precautions PT OT advised SNF. TOC working on [employer]. Monitor neurochecks closely.  COPD (chronic obstructive pulmonary disease) (HCC) Stable from a respiratory standpoint Continue home inhalers  Essential hypertension BP stable. Continue coreg therapy.  Chronic kidney disease, stage 3a (HCC) Creatinine baseline. Avoid nephrotoxic drugs.  Elevated troponin Trop 100s-->70s in setting of fall  Suspect minimal to mild demand ischemia  Downtrending troponin without chest pain   Chronic heart failure with preserved ejection fraction (HFpEF) (HCC) 2D echo August 2024 with EF of 55 to 60% and grade 2 diastolic dysfunction. Clinically dry on presentation received gentle IV fluids which are stopped now.  Afib (HCC) Continue Coreg On low-dose Eliquis.  Patient understand risks and benefit of blood thinner.  She would like to stop blood thinner for now.  She understands the risk of stroke and/or blood clots.  She does remain at high risk for recurrent falls      Diet:  Diet Orders (From admission, onward)     Start     Ordered   01/14/24  0844  Diet 2 gram sodium Fluid consistency: Thin  Diet effective now       Question:  Fluid consistency:  Answer:  Thin   01/14/24 0843           DVT prophylaxis:   Level of care: Telemetry Medical   Code Status: Full Code  Subjective: She denies any new issues.  Physical Exam: Vitals:   01/16/24 1549 01/16/24 2015 01/17/24 0300 01/17/24 0750  BP: (!) 142/80 (!) 147/68 136/70 (!) 120/46  Pulse: (!) 48 61 (!) 56 (!) 54  Resp: 18 16 18 16   Temp: 97.8 F (36.6 C) 97.7 F (36.5 C) 98.4 F (36.9 C) 98.4 F (36.9 C)  TempSrc: Oral Oral Oral Oral  SpO2: 94% 96% 97% 96%  Weight:      Height:        General - Elderly thin built Caucasian female, sitting in no distress. HEENT - PERRLA, EOMI, atraumatic head, non tender sinuses. Lung - Clear, basal rales, rhonchi, wheezes. Heart - S1, S2 heard, no murmurs, rubs, trace pedal edema. Abdomen - Soft, non tender, bowel sounds good Neuro -Alert, awake and oriented x3, non focal neuro exam. Skin - Warm and dry.  Data Reviewed:      Latest Ref Rng & Units 01/14/2024    5:38 AM 01/12/2024    8:49 PM 01/11/2024   12:43 PM  CBC  WBC 4.0 - 10.5 K/uL 6.5  5.1  5.6   Hemoglobin 12.0 - 15.0 g/dL 95.2  84.1  32.4   Hematocrit 36.0 - 46.0 % 44.9  47.5  44.7   Platelets 150 - 400 K/uL 149  167  136  Latest Ref Rng & Units 01/14/2024    5:38 AM 01/12/2024    8:49 PM 01/12/2024    6:00 AM  BMP  Glucose 70 - 99 mg/dL 86  130  84   BUN 8 - 23 mg/dL 33  28  20   Creatinine 0.44 - 1.00 mg/dL 8.65  7.84  6.96   Sodium 135 - 145 mmol/L 142  137  139   Potassium 3.5 - 5.1 mmol/L 4.3  4.4  3.2   Chloride 98 - 111 mmol/L 107  102  101   CO2 22 - 32 mmol/L 27  23  29    Calcium 8.9 - 10.3 mg/dL 29.5  28.4  13.2    No results found.   Family Communication: Discussed with patient's husband, son at bedside. They understand and agree. All questions answered.  Disposition: Status is: inpatient because: acute encephalopathy, poor oral intake.   Waiting for SNF placement  Planned Discharge Destination: Skilled nursing facility     Time spent: 35 minutes  Author: Brenna Cam, MD 01/17/2024 12:47 PM Secure chat 7am to 7pm For on call review [email or web address].

## 2024-01-17 NOTE — Progress Notes (Signed)
 Mobility Specialist - Progress Note   01/17/24 1201  Mobility  Activity Ambulated with assistance in hallway;Stood at bedside;Dangled on edge of bed  Level of Assistance Contact guard assist, steadying assist  Assistive Device Front wheel walker  Distance Ambulated (ft) 60 ft  Activity Response Tolerated well  Mobility Referral Yes  Mobility visit 1 Mobility  Mobility Specialist Start Time (ACUTE ONLY) 1041  Mobility Specialist Stop Time (ACUTE ONLY) 1101  Mobility Specialist Time Calculation (min) (ACUTE ONLY) 20 min   Pt supine in bed on RA upon arrival. Pt completes bed mobility, STS, and ambulates in hallway CGA. Pt left in recliner with needs in reach and chair alarm activated.   Wash Hack  Mobility Specialist  01/17/24 12:02 PM

## 2024-01-18 ENCOUNTER — Telehealth: Payer: Self-pay | Admitting: Student

## 2024-01-18 DIAGNOSIS — R2689 Other abnormalities of gait and mobility: Secondary | ICD-10-CM | POA: Diagnosis not present

## 2024-01-18 DIAGNOSIS — G629 Polyneuropathy, unspecified: Secondary | ICD-10-CM | POA: Diagnosis not present

## 2024-01-18 DIAGNOSIS — R2681 Unsteadiness on feet: Secondary | ICD-10-CM | POA: Diagnosis not present

## 2024-01-18 DIAGNOSIS — I491 Atrial premature depolarization: Secondary | ICD-10-CM | POA: Diagnosis not present

## 2024-01-18 DIAGNOSIS — I4891 Unspecified atrial fibrillation: Secondary | ICD-10-CM | POA: Diagnosis not present

## 2024-01-18 DIAGNOSIS — C50919 Malignant neoplasm of unspecified site of unspecified female breast: Secondary | ICD-10-CM | POA: Diagnosis not present

## 2024-01-18 DIAGNOSIS — R7989 Other specified abnormal findings of blood chemistry: Secondary | ICD-10-CM | POA: Diagnosis not present

## 2024-01-18 DIAGNOSIS — E785 Hyperlipidemia, unspecified: Secondary | ICD-10-CM | POA: Diagnosis not present

## 2024-01-18 DIAGNOSIS — Z515 Encounter for palliative care: Secondary | ICD-10-CM | POA: Diagnosis not present

## 2024-01-18 DIAGNOSIS — R4189 Other symptoms and signs involving cognitive functions and awareness: Secondary | ICD-10-CM | POA: Diagnosis not present

## 2024-01-18 DIAGNOSIS — K588 Other irritable bowel syndrome: Secondary | ICD-10-CM | POA: Diagnosis not present

## 2024-01-18 DIAGNOSIS — I700 Atherosclerosis of aorta: Secondary | ICD-10-CM | POA: Diagnosis not present

## 2024-01-18 DIAGNOSIS — I5032 Chronic diastolic (congestive) heart failure: Secondary | ICD-10-CM | POA: Diagnosis not present

## 2024-01-18 DIAGNOSIS — G9340 Encephalopathy, unspecified: Secondary | ICD-10-CM | POA: Diagnosis not present

## 2024-01-18 DIAGNOSIS — M6281 Muscle weakness (generalized): Secondary | ICD-10-CM | POA: Diagnosis not present

## 2024-01-18 DIAGNOSIS — R278 Other lack of coordination: Secondary | ICD-10-CM | POA: Diagnosis not present

## 2024-01-18 DIAGNOSIS — Z741 Need for assistance with personal care: Secondary | ICD-10-CM | POA: Diagnosis not present

## 2024-01-18 DIAGNOSIS — I130 Hypertensive heart and chronic kidney disease with heart failure and stage 1 through stage 4 chronic kidney disease, or unspecified chronic kidney disease: Secondary | ICD-10-CM | POA: Diagnosis not present

## 2024-01-18 DIAGNOSIS — Z853 Personal history of malignant neoplasm of breast: Secondary | ICD-10-CM | POA: Diagnosis not present

## 2024-01-18 DIAGNOSIS — E46 Unspecified protein-calorie malnutrition: Secondary | ICD-10-CM | POA: Diagnosis not present

## 2024-01-18 DIAGNOSIS — R918 Other nonspecific abnormal finding of lung field: Secondary | ICD-10-CM | POA: Diagnosis not present

## 2024-01-18 DIAGNOSIS — I2510 Atherosclerotic heart disease of native coronary artery without angina pectoris: Secondary | ICD-10-CM | POA: Diagnosis not present

## 2024-01-18 DIAGNOSIS — R911 Solitary pulmonary nodule: Secondary | ICD-10-CM | POA: Diagnosis not present

## 2024-01-18 DIAGNOSIS — I5033 Acute on chronic diastolic (congestive) heart failure: Secondary | ICD-10-CM | POA: Diagnosis not present

## 2024-01-18 DIAGNOSIS — J449 Chronic obstructive pulmonary disease, unspecified: Secondary | ICD-10-CM | POA: Diagnosis not present

## 2024-01-18 DIAGNOSIS — R531 Weakness: Secondary | ICD-10-CM | POA: Diagnosis not present

## 2024-01-18 DIAGNOSIS — Z7189 Other specified counseling: Secondary | ICD-10-CM | POA: Diagnosis not present

## 2024-01-18 DIAGNOSIS — J439 Emphysema, unspecified: Secondary | ICD-10-CM | POA: Diagnosis not present

## 2024-01-18 DIAGNOSIS — N1831 Chronic kidney disease, stage 3a: Secondary | ICD-10-CM | POA: Diagnosis not present

## 2024-01-18 LAB — BASIC METABOLIC PANEL WITH GFR
Anion gap: 8 (ref 5–15)
BUN: 28 mg/dL — ABNORMAL HIGH (ref 8–23)
CO2: 28 mmol/L (ref 22–32)
Calcium: 10.7 mg/dL — ABNORMAL HIGH (ref 8.9–10.3)
Chloride: 102 mmol/L (ref 98–111)
Creatinine, Ser: 0.75 mg/dL (ref 0.44–1.00)
GFR, Estimated: >60 mL/min (ref >60)
Glucose, Bld: 98 mg/dL (ref 70–99)
Potassium: 4 mmol/L (ref 3.5–5.1)
Sodium: 138 mmol/L (ref 135–145)

## 2024-01-18 LAB — CBC
HCT: 41.9 % (ref 36.0–46.0)
Hemoglobin: 13.8 g/dL (ref 12.0–15.0)
MCH: 32.9 pg (ref 26.0–34.0)
MCHC: 32.9 g/dL (ref 30.0–36.0)
MCV: 100 fL (ref 80.0–100.0)
Platelets: 151 10*3/uL (ref 150–400)
RBC: 4.19 MIL/uL (ref 3.87–5.11)
RDW: 13.2 % (ref 11.5–15.5)
WBC: 3.9 10*3/uL — ABNORMAL LOW (ref 4.0–10.5)
nRBC: 0 % (ref 0.0–0.2)

## 2024-01-18 MED ORDER — LIDOCAINE 5 % EX PTCH
1.0000 | MEDICATED_PATCH | CUTANEOUS | Status: DC
  Administered 2024-01-18: 1 via TRANSDERMAL
  Filled 2024-01-18: qty 1

## 2024-01-18 MED ORDER — IBUPROFEN 400 MG PO TABS
400.0000 mg | ORAL_TABLET | Freq: Three times a day (TID) | ORAL | Status: DC
  Administered 2024-01-18: 400 mg via ORAL
  Filled 2024-01-18: qty 1

## 2024-01-18 MED ORDER — GABAPENTIN 300 MG PO CAPS: 300.0000 mg | ORAL_CAPSULE | Freq: Three times a day (TID) | ORAL | Status: DC

## 2024-01-18 MED ORDER — ALBUTEROL SULFATE HFA 108 (90 BASE) MCG/ACT IN AERS: 1.0000 | INHALATION_SPRAY | Freq: Four times a day (QID) | RESPIRATORY_TRACT | Status: AC | PRN

## 2024-01-18 MED ORDER — ACETAMINOPHEN 500 MG PO TABS
1000.0000 mg | ORAL_TABLET | ORAL | Status: AC
  Administered 2024-01-18: 1000 mg via ORAL
  Filled 2024-01-18: qty 2

## 2024-01-18 MED ORDER — ACETAMINOPHEN 325 MG PO TABS: 650.0000 mg | ORAL_TABLET | Freq: Four times a day (QID) | ORAL | Status: DC | PRN

## 2024-01-18 MED ORDER — IBUPROFEN 400 MG PO TABS: 400.0000 mg | ORAL_TABLET | Freq: Three times a day (TID) | ORAL | 0 refills | Status: DC | PRN

## 2024-01-18 MED ORDER — CYCLOBENZAPRINE HCL 10 MG PO TABS
5.0000 mg | ORAL_TABLET | Freq: Three times a day (TID) | ORAL | Status: DC | PRN
  Administered 2024-01-18: 5 mg via ORAL
  Filled 2024-01-18: qty 1

## 2024-01-18 NOTE — TOC Transition Note (Signed)
 Transition of Care [facility]) - Discharge Note   Patient Details  Name: Tiffany Caldwell MRN: [id number] Date of Birth: [birth year]  Transition of [facility]) CM/SW Contact:  Elsie Halo, RN Phone Number: 01/18/2024, 11:30 AM   Clinical Narrative:    Patient is medically clear for dc to [facility]. The patient will be transported to the facility by her husband Aimee Houseman [phone], who is agreeable with the dc plan. No other TOC needs identified.  Nurse to call report to [phone]   Final next level of care: Skilled Nursing Facility Barriers to Discharge: Insurance Authorization   Patient Goals and CMS Choice            Discharge Placement              Patient chooses bed at: [facility] Patient to be transferred to facility by: Charlesetta Connors Name of family member notified: Valinda Gault Patient and family notified of of transfer: 01/18/24  Discharge Plan and Services Additional resources added to the After Visit Summary for     Discharge Planning Services: CM Consult                                 Social Drivers of Health (SDOH) Interventions SDOH Screenings   Food Insecurity: No Food Insecurity (01/14/2024)  Housing: Low Risk  (01/14/2024)  Transportation Needs: No Transportation Needs (01/14/2024)  Utilities: Not At Risk (01/14/2024)  Social Connections: Socially Integrated (01/14/2024)  Tobacco Use: Medium Risk (01/13/2024)     Readmission Risk Interventions     No data to display

## 2024-01-18 NOTE — Telephone Encounter (Signed)
 Review meds for admission to rehab facility.

## 2024-01-18 NOTE — Progress Notes (Signed)
 Physical Therapy Treatment Patient Details Name: Tiffany Caldwell MRN: [id number] DOB: [birth year] Today's Date: 01/18/2024   History of Present Illness Pt is an 83 y.o. female presented with fall, encephalopathy. Pt was discharged from hospital the day prior for A-fib with RVR and acute on chronic HFpEF, but fell going back to her living facility. PMH of COPD, CAD, chronic HFpEF, HCOM, CKD stage IIIa, chronic thrombocytopenia, pulmonary MAI, IBS, PVC/PAC, breast cancer    PT Comments  Pt is excited for potential dc to SNF this date. Reports she has been up several times this morning from bed<>chair with nursing staff. Declines ambulation attempts this date and reports she wants to stay in recliner. Able to sit<>stand for positioning as pt sliding out of chair and pillow placed to give bottom support. B UE there-ex performed while seated and educated on breath work. Will continue to progress as able.   If plan is discharge home, recommend the following: A lot of help with walking and/or transfers;A lot of help with bathing/dressing/bathroom;Help with stairs or ramp for entrance;Supervision due to cognitive status;Assist for transportation   Can travel by private vehicle     Yes  Equipment Recommendations  None recommended by PT    Recommendations for Other Services       Precautions / Restrictions Precautions Precautions: Fall Recall of Precautions/Restrictions: Intact Restrictions Weight Bearing Restrictions Per Provider Order: No     Mobility  Bed Mobility               General bed mobility comments: NT, recevied in recliner    Transfers Overall transfer level: Needs assistance Equipment used: 1 person hand held assist Transfers: Sit to/from Stand, Bed to chair/wheelchair/BSC Sit to Stand: Min assist           General transfer comment: cues for pushing from seated surface.Once standing flexed posture. Placed pillow in bottom of chair for comfort     Ambulation/Gait               General Gait Details: declined to ambulation at this time   Stairs             Wheelchair Mobility     Tilt Bed    Modified Rankin (Stroke Patients Only)       Balance Overall balance assessment: Needs assistance Sitting-balance support: Feet supported Sitting balance-Leahy Scale: Good     Standing balance support: Bilateral upper extremity supported, Reliant on assistive device for balance Standing balance-Leahy Scale: Fair                              Hotel manager: No apparent difficulties  Cognition Arousal: Alert Behavior During Therapy: WFL for tasks assessed/performed   PT - Cognitive impairments: History of cognitive impairments                       PT - Cognition Comments: following commands and agreeable to session. Following commands: Impaired      Cueing Cueing Techniques: Verbal cues, Gestural cues  Exercises Other Exercises Other Exercises: ther-ex performed on B UE with heavy education of performance and technique. Able to perform modified seated abdominal crunches using B hands, scap squeezes, and modified chair push ups. Fatigues and needs rest breaks between there-ex. 10 reps performed    General Comments        Pertinent Vitals/Pain Pain Assessment Pain Assessment: Faces Faces Pain Scale: Hurts even more  Pain Location: buttocks Pain Descriptors / Indicators: Sore Pain Intervention(s): Limited activity within patient's tolerance, Repositioned    Home Living                          Prior Function            PT Goals (current goals can now be found in the care plan section) Acute Rehab PT Goals Patient Stated Goal: to go home PT Goal Formulation: With family Time For Goal Achievement: 01/28/24 Potential to Achieve Goals: Fair Progress towards PT goals: Progressing toward goals    Frequency    Min 2X/week      PT  Plan      Co-evaluation              AM-PAC PT "6 Clicks" Mobility   Outcome Measure  Help needed turning from your back to your side while in a flat bed without using bedrails?: A Lot Help needed moving from lying on your back to sitting on the side of a flat bed without using bedrails?: A Lot Help needed moving to and from a bed to a chair (including a wheelchair)?: A Lot Help needed standing up from a chair using your arms (e.g., wheelchair or bedside chair)?: A Lot Help needed to walk in hospital room?: A Lot Help needed climbing 3-5 steps with a railing? : Total 6 Click Score: 11    End of Session Equipment Utilized During Treatment: Gait belt Activity Tolerance: Patient tolerated treatment well Patient left: in chair;with chair alarm set Nurse Communication: Mobility status PT Visit Diagnosis: Unsteadiness on feet (R26.81);Muscle weakness (generalized) (M62.81);History of falling (Z91.81);Difficulty in walking, not elsewhere classified (R26.2);Pain Pain - Right/Left:  (buttocks) Pain - part of body:  (buttocks)     Time: 1610-9604 PT Time Calculation (min) (ACUTE ONLY): 14 min  Charges:    $Therapeutic Exercise: 8-22 mins PT General Charges $$ ACUTE PT VISIT: 1 Visit                     Amparo Balk, PT, DPT, GCS [phone]    Ernie Kasler 01/18/2024, 11:06 AM

## 2024-01-18 NOTE — Progress Notes (Signed)
 Called [facility] [phone] and provided report to [place], LPN. Patient transported by wheelchair to [facility] entrance with personal belongings, by this RN. Patient transported by family member to [facility] facility.

## 2024-01-18 NOTE — Care Management Important Message (Signed)
 Important Message  Patient Details  Name: Tiffany Caldwell MRN: [id number] Date of Birth: [birth year]   Important Message Given:  Yes - Medicare IM     Denai Caba W, CMA 01/18/2024, 11:19 AM

## 2024-01-18 NOTE — Discharge Summary (Signed)
 Physician Discharge Summary   Patient: Tiffany Caldwell MRN: [id number] DOB: 10/04/1941  Admit date:     01/12/2024  Discharge date: 01/18/24  Discharge Physician: Delfino Lovett   PCP: Delma Officer, PA   Recommendations at discharge:   Follow-up with outpatient providers as requested  Discharge Diagnoses: Principal Problem:   Acute encephalopathy Active Problems:   COPD (chronic obstructive pulmonary disease) (HCC)   Essential hypertension   Chronic kidney disease, stage 3a (HCC)   Afib (HCC)   Chronic heart failure with preserved ejection fraction (HFpEF) (HCC)   Elevated troponin  Hospital Course: Assessment and Plan:  Tiffany Caldwell is a 83 y.o. female with medical history significant of COPD, CAD, chronic HFpEF, HCOM, CKD stage IIIa, chronic thrombocytopenia, pulmonary MAI, IBS, PVC/PAC, breast cancer, recently discharged after treating for Afib, heart failure presented with fall, encephalopathy.    Assessment and Plan: * Acute encephalopathy S/p Fall Generalized weakness Suspect likely multifactorial with contributions of dehydration, recurrent falls and/or concussion. Imaging unremarkable.  Nonfocal neuroexam. No overt infection noted. She is alert today and able to clearly answer Her oral intake is improved Avoid sedatives and narcotics Fall, aspiration precautions PT OT advised SNF.  She is going to SNF at [facility]   COPD (chronic obstructive pulmonary disease) (HCC) Stable from a respiratory standpoint Continue home inhalers   Essential hypertension BP stable. Continue coreg    Chronic kidney disease, stage 3a (HCC) Creatinine baseline.   Elevated troponin Trop 100s-->70s in setting of fall  Suspect minimal to mild demand ischemia  Downtrending troponin without chest pain    Chronic heart failure with preserved ejection fraction (HFpEF) (HCC) 2D echo August 2024 with EF of 55 to 60% and grade 2 diastolic dysfunction.    Afib (HCC) Continue  Coreg for rate control Considering high risk for bleed due to recurrent fall Eliquis has been discontinued on this admission. Patient and husband understand risks and benefit of blood thinner.  They would like to stop blood thinner for now.  They understands the risk of stroke and/or blood clots.  She does remain at high risk for recurrent falls         Disposition: Skilled nursing facility Diet recommendation:  Discharge Diet Orders (From admission, onward)     Start     Ordered   01/18/24 0000  Diet - low sodium heart healthy        01/18/24 1200           Carb modified diet DISCHARGE MEDICATION: Allergies as of 01/18/2024       Reactions   Amlodipine Swelling   Leg swelling and fatigue.   Hydromorphone Other (See Comments)   ALTERED MENTAL STATUS Altered mental status   Morphine Other (See Comments)   Pt and family report altered mental status.   Azithromycin Rash   Ciprofloxacin Nausea Only, Nausea And Vomiting   Hydromorphone Hcl Other (See Comments)   Altered mental status Altered mental status   Levofloxacin Nausea Only, Other (See Comments)   Lisinopril Other (See Comments)   Fatigue Fatigue        Medication List     STOP taking these medications    apixaban 2.5 MG Tabs tablet Commonly known as: ELIQUIS   metoprolol succinate 25 MG 24 hr tablet Commonly known as: TOPROL-XL       TAKE these medications    acetaminophen 500 MG tablet Commonly known as: TYLENOL Take 1,000 mg by mouth 2 (two) times daily.  albuterol 108 (90 Base) MCG/ACT inhaler Commonly known as: VENTOLIN HFA INHALE 1-2 PUFFS BY MOUTH EVERY 6 HOURS AS NEEDED FOR WHEEZING OR SHORTNESS OF BREATH What changed: See the new instructions.   Anoro Ellipta 62.5-25 MCG/ACT Aepb Generic drug: umeclidinium-vilanterol INHALE 1 PUFF INTO THE LUNGS BY MOUTH ONCE DAILY   atorvastatin 20 MG tablet Commonly known as: LIPITOR Take 1 tablet (20 mg total) by mouth daily.   Biotin 10000  MCG Tabs Take 1 tablet by mouth daily.   carvedilol 12.5 MG tablet Commonly known as: COREG Take 1 tablet (12.5 mg total) by mouth 2 (two) times daily with a meal.   feeding supplement Liqd Take 237 mLs by mouth 2 (two) times daily between meals.   fexofenadine 180 MG tablet Commonly known as: ALLEGRA Take 180 mg by mouth daily.   fish oil-omega-3 fatty acids 1000 MG capsule Take 1 g by mouth daily.   furosemide 20 MG tablet Commonly known as: Lasix Take 1 tablet (20 mg total) by mouth every other day. Take 1 tablet 3 times a week (Monday, Wednesday and Friday) as needed.   gabapentin 300 MG capsule Commonly known as: NEURONTIN Take 1 capsule (300 mg total) by mouth 3 (three) times daily. What changed: when to take this   GLUCOSAMINE CHONDR 1500 COMPLX PO Take 1 tablet by mouth once.   ibuprofen 400 MG tablet Commonly known as: ADVIL Take 1 tablet (400 mg total) by mouth every 8 (eight) hours as needed.   mexiletine 150 MG capsule Commonly known as: MEXITIL Take 1 capsule (150 mg total) by mouth 2 (two) times daily.   multivitamin tablet Take 1 tablet by mouth daily.   nitroGLYCERIN 0.4 MG SL tablet Commonly known as: NITROSTAT Place 0.4 mg under the tongue every 5 (five) minutes as needed for chest pain.   potassium chloride 10 MEQ tablet Commonly known as: KLOR-CON M Take 1 tablet (10 mEq total) by mouth every other day.        Follow-up Information     Karalee Oscar, [place] Follow up.   Specialty: Internal Medicine Why: Hospital follow up Contact information: [address] [place] [address] [phone]                Discharge Exam: Tiffany Caldwell Weights   01/12/24 1923  Weight: 53.8 kg   General - Elderly cachectic looking Caucasian female, sitting in no distress. HEENT - PERRLA, EOMI, atraumatic head, non tender sinuses. Lung -clear to auscultation bilaterally Heart - S1, S2 heard, no murmurs, rubs, trace pedal  edema. Abdomen - Soft, non tender, bowel sounds good Neuro -Alert, awake and oriented x3, non focal neuro exam. Skin - Warm and dry.  Condition at discharge: fair  The results of significant diagnostics from this hospitalization (including imaging, microbiology, ancillary and laboratory) are listed below for reference.   Imaging Studies: CT PELVIS WO CONTRAST Result Date: 01/13/2024 CLINICAL DATA:  Hip trauma right hip pain fall EXAM: CT PELVIS WITHOUT CONTRAST TECHNIQUE: Multidetector CT imaging of the pelvis was performed following the standard protocol without intravenous contrast. RADIATION DOSE REDUCTION: This exam was performed according to the departmental dose-optimization program which includes automated exposure control, adjustment of the mA and/or kV according to patient size and/or use of iterative reconstruction technique. COMPARISON:  Radiograph 01/12/2024, CT 11/05/2023 FINDINGS: Urinary Tract: Partially obscured by artifact from right hip hardware. No acute abnormality Bowel: Diverticular disease of the sigmoid colon. No acute wall thickening Vascular/Lymphatic: Atherosclerosis. No  aneurysm or suspicious lymph nodes Reproductive:  Negative for adnexal mass Other:  Negative for pelvic effusion Musculoskeletal: Right hip replacement with normal alignment. No definitive fracture is seen. No SI joint widening. Pubic symphysis and rami appear intact. IMPRESSION: 1. Right hip replacement with normal alignment. No definitive fracture is seen. 2. Diverticular disease of the sigmoid colon. Electronically Signed   By: Esmeralda Hedge M.D.   On: 01/13/2024 01:44   CT HEAD WO CONTRAST ( ) Result Date: 01/12/2024 CLINICAL DATA:  Head trauma, fell backwards and hit head. Recently started anticoagulation. EXAM: CT HEAD WITHOUT CONTRAST CT CERVICAL SPINE WITHOUT CONTRAST TECHNIQUE: Multidetector CT imaging of the head and cervical spine was performed following the standard protocol without intravenous  contrast. Multiplanar CT image reconstructions of the cervical spine were also generated. RADIATION DOSE REDUCTION: This exam was performed according to the departmental dose-optimization program which includes automated exposure control, adjustment of the mA and/or kV according to patient size and/or use of iterative reconstruction technique. COMPARISON:  CT head 11/05/2023. FINDINGS: CT HEAD FINDINGS Brain: No acute intracranial hemorrhage. No CT evidence of acute infarct. Nonspecific hypoattenuation in the periventricular and subcortical white matter favored to reflect chronic microvascular ischemic changes. No edema, mass effect, or midline shift. The basilar cisterns are patent. Ventricles: Prominence of the ventricles suggesting underlying parenchymal volume loss. Vascular: Atherosclerotic calcifications of the carotid siphons. No hyperdense vessel. Skull: No acute or aggressive finding. Orbits: Orbits are symmetric. Sinuses: The visualized paranasal sinuses are clear. Other: Mastoid air cells are clear. CT CERVICAL SPINE FINDINGS Alignment: Straightening of the normal cervical lordosis. Stepwise trace anterolisthesis of C2 on C3, C3 on C4, C4 on C5, and C5 on C6. There is additional 3 mm anterolisthesis of C6 on C7. Trace anterolisthesis of C7 on T1. No facet subluxation or dislocation. Skull base and vertebrae: No compression fracture or displaced fracture in the cervical spine. No suspicious osseous lesion. Soft tissues and spinal canal: No prevertebral fluid or swelling. No visible canal hematoma. Disc levels: Moderate disc space narrowing at multiple levels. Disc osteophyte complexes throughout the cervical spine. There is no evidence of high-grade osseous spinal canal stenosis. Facet arthrosis and uncovertebral hypertrophy at multiple levels. Upper chest: Biapical pleuroparenchymal scarring.  Emphysema. Other: None. IMPRESSION: No CT evidence of acute intracranial abnormality. Slightly limited evaluation  of the cervical spine due to artifact and positioning. Within these limitations there is no acute fracture or traumatic malalignment appreciated. Chronic and degenerative changes as above. Emphysema (ICD10-J43.9). Electronically Signed   By: Denny Flack M.D.   On: 01/12/2024 21:56   CT Cervical Spine Wo Contrast Result Date: 01/12/2024 CLINICAL DATA:  Head trauma, fell backwards and hit head. Recently started anticoagulation. EXAM: CT HEAD WITHOUT CONTRAST CT CERVICAL SPINE WITHOUT CONTRAST TECHNIQUE: Multidetector CT imaging of the head and cervical spine was performed following the standard protocol without intravenous contrast. Multiplanar CT image reconstructions of the cervical spine were also generated. RADIATION DOSE REDUCTION: This exam was performed according to the departmental dose-optimization program which includes automated exposure control, adjustment of the mA and/or kV according to patient size and/or use of iterative reconstruction technique. COMPARISON:  CT head 11/05/2023. FINDINGS: CT HEAD FINDINGS Brain: No acute intracranial hemorrhage. No CT evidence of acute infarct. Nonspecific hypoattenuation in the periventricular and subcortical white matter favored to reflect chronic microvascular ischemic changes. No edema, mass effect, or midline shift. The basilar cisterns are patent. Ventricles: Prominence of the ventricles suggesting underlying parenchymal volume loss. Vascular: Atherosclerotic calcifications of  the carotid siphons. No hyperdense vessel. Skull: No acute or aggressive finding. Orbits: Orbits are symmetric. Sinuses: The visualized paranasal sinuses are clear. Other: Mastoid air cells are clear. CT CERVICAL SPINE FINDINGS Alignment: Straightening of the normal cervical lordosis. Stepwise trace anterolisthesis of C2 on C3, C3 on C4, C4 on C5, and C5 on C6. There is additional 3 mm anterolisthesis of C6 on C7. Trace anterolisthesis of C7 on T1. No facet subluxation or dislocation.  Skull base and vertebrae: No compression fracture or displaced fracture in the cervical spine. No suspicious osseous lesion. Soft tissues and spinal canal: No prevertebral fluid or swelling. No visible canal hematoma. Disc levels: Moderate disc space narrowing at multiple levels. Disc osteophyte complexes throughout the cervical spine. There is no evidence of high-grade osseous spinal canal stenosis. Facet arthrosis and uncovertebral hypertrophy at multiple levels. Upper chest: Biapical pleuroparenchymal scarring.  Emphysema. Other: None. IMPRESSION: No CT evidence of acute intracranial abnormality. Slightly limited evaluation of the cervical spine due to artifact and positioning. Within these limitations there is no acute fracture or traumatic malalignment appreciated. Chronic and degenerative changes as above. Emphysema (ICD10-J43.9). Electronically Signed   By: Denny Flack M.D.   On: 01/12/2024 21:56   DG HIP UNILAT WITH PELVIS 2-3 VIEWS RIGHT Result Date: 01/12/2024 CLINICAL DATA:  Right hip pain after fall. EXAM: DG HIP (WITH OR WITHOUT PELVIS) 2-3V RIGHT COMPARISON:  None Available. FINDINGS: Right hip arthroplasty is intact. No periprosthetic fracture or lucency. No acute pelvic fracture. Pubic rami are intact. Pubic symphysis and sacroiliac joints are congruent. The bones are subjectively under mineralized. IMPRESSION: 1. No acute fracture or dislocation of the pelvis or right hip. 2. Right hip arthroplasty without complication. Electronically Signed   By: Chadwick Colonel M.D.   On: 01/12/2024 21:46   DG Chest 1 View Result Date: 01/12/2024 CLINICAL DATA:  Congestive heart failure. EXAM: CHEST  1 VIEW COMPARISON:  01/11/2024 FINDINGS: Status post median sternotomy and CABG procedure. Stable cardiac enlargement. No pleural effusion identified. No frank interstitial edema. Advanced changes of COPD/emphysema with upper lobe predominant scarring and architectural distortion. Chronic upper lobe predominant  scarring. No acute osseous findings. Surgical clips noted in the left axilla. IMPRESSION: 1. No acute findings. 2. Advanced changes of COPD/emphysema. Electronically Signed   By: Kimberley Penman M.D.   On: 01/12/2024 08:23   DG Chest Portable 1 View Result Date: 01/11/2024 CLINICAL DATA:  Palpitations.  Chest radiograph dated 11/02/2023. EXAM: PORTABLE CHEST 1 VIEW COMPARISON:  Stable cardiomegaly.  Prior median sternotomy and CABG. FINDINGS: Patient's chin obscures evaluation of the bilateral paramedian lung apices. Stable cardiomegaly. Prior median sternotomy and CABG. Aortic atherosclerosis. Emphysematous changes again noted. No focal consolidation, pleural effusion, or pneumothorax. Left axillary surgical clips. No acute osseous abnormality. IMPRESSION: 1. No acute cardiopulmonary findings. 2. Stable cardiomegaly. 3. Emphysema. Electronically Signed   By: Mannie Seek M.D.   On: 01/11/2024 15:06    Microbiology: Results for orders placed or performed during the hospital encounter of 01/11/24  Resp panel by RT-PCR (RSV, Flu A&B, Covid) Anterior Nasal Swab     Status: None   Collection Time: 01/11/24  1:31 PM   Specimen: Anterior Nasal Swab  Result Value Ref Range Status   SARS Coronavirus 2 by RT PCR NEGATIVE NEGATIVE Final    Comment: (NOTE) SARS-CoV-2 target nucleic acids are NOT DETECTED.  The SARS-CoV-2 RNA is generally detectable in upper respiratory specimens during the acute phase of infection. The lowest concentration of SARS-CoV-2 viral  copies this assay can detect is 138 copies/mL. A negative result does not preclude SARS-Cov-2 infection and should not be used as the sole basis for treatment or other patient management decisions. A negative result may occur with  improper specimen collection/handling, submission of specimen other than nasopharyngeal swab, presence of viral mutation(s) within the areas targeted by this assay, and inadequate number of viral copies(<138  copies/mL). A negative result must be combined with clinical observations, patient history, and epidemiological information. The expected result is Negative.  Fact Sheet for Patients:  BloggerCourse.com  Fact Sheet for Healthcare Providers:  SeriousBroker.it  This test is no t yet approved or cleared by the [place] FDA and  has been authorized for detection and/or diagnosis of SARS-CoV-2 by FDA under an Emergency Use Authorization (EUA). This EUA will remain  in effect (meaning this test can be used) for the duration of the COVID-19 declaration under Section 564(b)(1) of the Act, 21 U.S.C.section 360bbb-3(b)(1), unless the authorization is terminated  or revoked sooner.       Influenza A by PCR NEGATIVE NEGATIVE Final   Influenza B by PCR NEGATIVE NEGATIVE Final    Comment: (NOTE) The Xpert Xpress SARS-CoV-2/FLU/RSV plus assay is intended as an aid in the diagnosis of influenza from Nasopharyngeal swab specimens and should not be used as a sole basis for treatment. Nasal washings and aspirates are unacceptable for Xpert Xpress SARS-CoV-2/FLU/RSV testing.  Fact Sheet for Patients: BloggerCourse.com  Fact Sheet for Healthcare Providers: SeriousBroker.it  This test is not yet approved or cleared by the [place] FDA and has been authorized for detection and/or diagnosis of SARS-CoV-2 by FDA under an Emergency Use Authorization (EUA). This EUA will remain in effect (meaning this test can be used) for the duration of the COVID-19 declaration under Section 564(b)(1) of the Act, 21 U.S.C. section 360bbb-3(b)(1), unless the authorization is terminated or revoked.     Resp Syncytial Virus by PCR NEGATIVE NEGATIVE Final    Comment: (NOTE) Fact Sheet for Patients: BloggerCourse.com  Fact Sheet for Healthcare  Providers: SeriousBroker.it  This test is not yet approved or cleared by the [place] FDA and has been authorized for detection and/or diagnosis of SARS-CoV-2 by FDA under an Emergency Use Authorization (EUA). This EUA will remain in effect (meaning this test can be used) for the duration of the COVID-19 declaration under Section 564(b)(1) of the Act, 21 U.S.C. section 360bbb-3(b)(1), unless the authorization is terminated or revoked.  Performed at [facility], [address] [place], [place] [address]     Labs: CBC: Recent Labs  Lab 01/11/24 1243 01/12/24 2049 01/14/24 0538 01/18/24 0506  WBC 5.6 5.1 6.5 3.9*  NEUTROABS 4.1  --   --   --   HGB 15.0 15.4* 14.9 13.8  HCT 44.7 47.5* 44.9 41.9  MCV 99.8 101.5* 99.6 100.0  PLT 136* 167 149* 151   Basic Metabolic Panel: Recent Labs  Lab 01/11/24 1243 01/12/24 0600 01/12/24 2049 01/14/24 0538 01/18/24 0506  NA 138 139 137 142 138  K 3.9 3.2* 4.4 4.3 4.0  CL 101 101 102 107 102  CO2 28 29 23 27 28   GLUCOSE 98 84 115* 86 98  BUN 20 20 28* 33* 28*  CREATININE 0.91 0.81 1.03* 0.96 0.75  CALCIUM 10.8* 10.3 10.4* 10.5* 10.7*  MG 2.0  --   --   --   --   PHOS 3.1  --   --   --   --  Liver Function Tests: Recent Labs  Lab 01/11/24 1243 01/12/24 2049 01/14/24 0538  AST 43* 45* 53*  ALT 32 28 29  ALKPHOS 91 100 90  BILITOT 1.0 1.0 1.4*  PROT 7.3 6.9 6.5  ALBUMIN 3.9 3.7 3.3*   CBG: No results for input(s): "GLUCAP" in the last 168 hours.  Discharge time spent: greater than 30 minutes.  Signed: Brenna Cam, MD [facility] 01/18/2024

## 2024-01-20 ENCOUNTER — Encounter: Payer: Self-pay | Admitting: Student

## 2024-01-20 ENCOUNTER — Ambulatory Visit
Admission: RE | Admit: 2024-01-20 | Discharge: 2024-01-20 | Disposition: A | Discharge status: Home / Self Care | Source: Ambulatory Visit | Attending: Emergency Medicine | Admitting: Emergency Medicine

## 2024-01-20 ENCOUNTER — Non-Acute Institutional Stay (SKILLED_NURSING_FACILITY): Payer: Self-pay | Admitting: Student

## 2024-01-20 DIAGNOSIS — Z7189 Other specified counseling: Secondary | ICD-10-CM | POA: Diagnosis not present

## 2024-01-20 DIAGNOSIS — R531 Weakness: Secondary | ICD-10-CM | POA: Diagnosis not present

## 2024-01-20 DIAGNOSIS — I5033 Acute on chronic diastolic (congestive) heart failure: Secondary | ICD-10-CM | POA: Diagnosis not present

## 2024-01-20 DIAGNOSIS — I491 Atrial premature depolarization: Secondary | ICD-10-CM

## 2024-01-20 DIAGNOSIS — R911 Solitary pulmonary nodule: Secondary | ICD-10-CM

## 2024-01-20 DIAGNOSIS — I700 Atherosclerosis of aorta: Secondary | ICD-10-CM | POA: Diagnosis not present

## 2024-01-20 DIAGNOSIS — R918 Other nonspecific abnormal finding of lung field: Secondary | ICD-10-CM | POA: Diagnosis not present

## 2024-01-20 DIAGNOSIS — I4891 Unspecified atrial fibrillation: Secondary | ICD-10-CM

## 2024-01-20 DIAGNOSIS — J449 Chronic obstructive pulmonary disease, unspecified: Secondary | ICD-10-CM | POA: Diagnosis not present

## 2024-01-20 DIAGNOSIS — K588 Other irritable bowel syndrome: Secondary | ICD-10-CM | POA: Diagnosis not present

## 2024-01-20 DIAGNOSIS — E46 Unspecified protein-calorie malnutrition: Secondary | ICD-10-CM | POA: Diagnosis not present

## 2024-01-20 DIAGNOSIS — C50919 Malignant neoplasm of unspecified site of unspecified female breast: Secondary | ICD-10-CM | POA: Diagnosis not present

## 2024-01-20 DIAGNOSIS — J439 Emphysema, unspecified: Secondary | ICD-10-CM | POA: Diagnosis not present

## 2024-01-20 NOTE — Progress Notes (Signed)
 Provider:  Threasa Flood, M.D. Location:  Other [facility] Home Room Number: 112 A Place of Service:  SNF (31)  PCP: Karalee Oscar, PA Patient Care Team: Karalee Oscar, PA as PCP - General (Internal Medicine) End, Veryl Gottron, MD as PCP - Cardiology (Cardiology) Ardeen Kohler, MD as PCP - Electrophysiology (Cardiology) Tessa Figures, MD as Attending Physician (Gastroenterology) Armand Lamy., MD (Obstetrics and Gynecology) Opal Bill, MD (Inactive) (Radiation Oncology) Florance Hun, MD (Hematology and Oncology)  Extended Emergency Contact Information Primary Emergency Contact: [facility] Address: [address]          [place], [place] [address] [place] Mobile Phone: [phone] Relation: Spouse Secondary Emergency Contact: Grenz,John Address: [address]          [place], [place] [address] [place] Home Phone: [phone] Relation: Son  Code Status: Full Code  Goals of Care: Advanced Directive information    01/12/2024    6:00 AM  Advanced Directives  Does Patient Have a Medical Advance Directive? Yes  Type of Advance Directive Living will  Does patient want to make changes to medical advance directive? No - Patient declined      Chief Complaint  Patient presents with   New Admit To SNF    New admission to [facility]     HPI: Patient is a 83 y.o. female seen today for admission to [facility].  History of Present Illness The patient, with atrial fibrillation, presents after recent hospitalization with admission for weakness and recent falls.  She has experienced multiple falls recently, including one while getting out of her walker on the front stoop and another while exiting a car. These falls have resulted in significant bruising on her hips, making it difficult to sit or lay on her back, though no fractures or serious injuries were reported. A fall prior to feeling weak was not  associated with the weakness.  She describes feeling weak and 'like I had the flu,' which led to a hospital visit. A week after a fall, she experienced atrial fibrillation and was hospitalized, during which she was started on Eliquis . She has a history of atrial fibrillation and is currently on Eliquis .  She has a history of COPD and takes inhalers, which she reports are working fine. She has experienced significant weight loss, dropping from the high 120s to 114 pounds over the past six months, which she attributes to age despite eating well. She has a history of breast cancer in 2001 with no recurrence and a pulmonary nodule that has been monitored over the years with no significant changes.  She has IBS and experiences severe pain episodes, for which she takes hyoscyamine  as needed for colon spasms. She also takes Lasix  for swelling, though she does not feel she is currently swelling, and potassium with Lasix . She reports irregular bowel movements, occurring every three days.  Her current medications include extra strength Tylenol , Allegra, Anoro, atorvastatin  20 mg daily, biotin, fish oil, Coreg  12.5 mg BID, glucosamine, hyoscyamine  as needed, Lasix  20 mg as needed, mexiletine 150 mg, multivitamin, Neurontin  300 mg TID, potassium with Lasix , and albuterol  as needed.  Resident lives at home with her husband. She is retired from [employer]. She and her husband have 2 children, daughter and son. [place] in [place]. Husband states that Mrs. Zmuda does have the beginning of dementia but prior to the falls she has been doing well at  home. He wishes for her to return home after completion of rehab services. Past Medical History:  Diagnosis Date   Allergy    Arthritis    Breast cancer (HCC)    left breast   Coronary artery disease 2001   CABG   DDD (degenerative disc disease), lumbosacral    Dyspnea    if rushing or climmbing lots of stairs   Emphysema lung (HCC)     emphysema   GERD (gastroesophageal reflux disease)    11/20/16- no a current problem   Hyperlipidemia    Hypertension    Irritable bowel syndrome (IBS)    Lung disorder    leison   Neuropathy    Pinched nerve    back and left leg   Pneumonia 2016   Past Surgical History:  Procedure Laterality Date   BREAST FIBROADENOMA SURGERY  01/13/1980   BREAST SURGERY  02/01/2004   tram/mastectomyfor recurrence   CARPOMETACARPEL SUSPENSION PLASTY Right 12/17/2017   Procedure: SUSPENSIONPLASTY RIGHT THUMB WITH EXCISION TRAPEZIUM;  Surgeon: Lyanne Sample, MD;  Location: [facility];  Service: Orthopedics;  Laterality: Right;   COLONOSCOPY     CORONARY ANGIOPLASTY  2001   had tear to two vessels- had to have open heart surgery   CORONARY ARTERY BYPASS GRAFT  2001   EYE SURGERY Bilateral    Cataract   KNEE ARTHROSCOPY  January 2011   right   MASTECTOMY PARTIAL / LUMPECTOMY W/ AXILLARY LYMPHADENECTOMY  04/10/1989   Dr Linder Revere / left breast   PARTIAL HYSTERECTOMY     vaginal   RIGHT/LEFT HEART CATH AND CORONARY ANGIOGRAPHY Bilateral 06/17/2023   Procedure: RIGHT/LEFT HEART CATH AND CORONARY ANGIOGRAPHY;  Surgeon: Sammy Crisp, MD;  Location: [facility];  Service: Cardiovascular;  Laterality: Bilateral;   TENDON TRANSFER Right 12/17/2017   Procedure: ABDUCTOR POLLICIS LONGUS TRANSFER;  Surgeon: Lyanne Sample, MD;  Location: [facility];  Service: Orthopedics;  Laterality: Right;   TONSILLECTOMY AND ADENOIDECTOMY     TOTAL HIP ARTHROPLASTY Right 08/05/2016   Procedure: TOTAL HIP ARTHROPLASTY ANTERIOR APPROACH;  Surgeon: Dayne Even, MD;  Location: [facility];  Service: Orthopedics;  Laterality: Right;   UPPER GASTROINTESTINAL ENDOSCOPY     VIDEO BRONCHOSCOPY WITH ENDOBRONCHIAL NAVIGATION N/A 11/27/2016   Procedure: VIDEO BRONCHOSCOPY WITH ENDOBRONCHIAL NAVIGATION;  Surgeon: Zelphia Higashi, MD;  Location: [facility] OR;  Service: Thoracic;  Laterality: N/A;    reports  that she quit smoking about 34 years ago. Her smoking use included cigarettes. She started smoking about 64 years ago. She has never used smokeless tobacco. She reports current alcohol use of about 1.0 standard drink of alcohol per week. She reports that she does not use drugs. Social History   Socioeconomic History   Marital status: Married    Spouse name: Not on file   Number of children: Not on file   Years of education: Not on file   Highest education level: Not on file  Occupational History   Not on file  Tobacco Use   Smoking status: Former    Current packs/day: 0.00    Types: Cigarettes    Start date: 08/01/1959    Quit date: 07/31/1989    Years since quitting: 34.4   Smokeless tobacco: Never  Vaping Use   Vaping status: Never Used  Substance and Sexual Activity   Alcohol use: Yes    Alcohol/week: 1.0 standard drink of alcohol    Types: 1 Glasses of wine per week  Comment: occasional wine   Drug use: No   Sexual activity: Not on file  Other Topics Concern   Not on file  Social History Narrative   Not on file   Social Drivers of Health   Financial Resource Strain: Not on file  Food Insecurity: No Food Insecurity (01/14/2024)   Hunger Vital Sign    Worried About Running Out of Food in the Last Year: Never true    Ran Out of Food in the Last Year: Never true  Transportation Needs: No Transportation Needs (01/14/2024)   PRAPARE - Administrator, Civil Service (Medical): No    Lack of Transportation (Non-Medical): No  Physical Activity: Not on file  Stress: Not on file  Social Connections: Socially Integrated (01/14/2024)   Social Connection and Isolation Panel [NHANES]    Frequency of Communication with Friends and Family: More than three times a week    Frequency of Social Gatherings with Friends and Family: More than three times a week    Attends Religious Services: More than 4 times per year    Active Member of Clubs or Organizations: Yes    Attends  Banker Meetings: 1 to 4 times per year    Marital Status: Married  Catering manager Violence: Not At Risk (01/14/2024)   Humiliation, Afraid, Rape, and Kick questionnaire    Fear of Current or Ex-Partner: No    Emotionally Abused: No    Physically Abused: No    Sexually Abused: No    Functional Status Survey:    Family History  Problem Relation Age of Onset   Emphysema Father        deceased   Heart disease Mother        deceased   Colon cancer Paternal Grandmother    Colon cancer Son    Colon cancer Paternal Uncle     Health Maintenance  Topic Date Due   DTaP/Tdap/Td (1 - Tdap) Never done   DEXA SCAN  Never done   Pneumonia Vaccine 75+ Years old (2 of 2 - PCV) 07/02/2019   Colonoscopy  02/27/2020   COVID-19 Vaccine (3 - Moderna risk series) 09/14/2020   Medicare Annual Wellness (AWV)  08/05/2023   INFLUENZA VACCINE  05/06/2024   Zoster Vaccines- Shingrix  Completed   HPV VACCINES  Aged Out   Meningococcal B Vaccine  Aged Out    Allergies  Allergen Reactions   Amlodipine  Swelling    Leg swelling and fatigue.   Hydromorphone  Other (See Comments)    ALTERED MENTAL STATUS Altered mental status   Morphine  Other (See Comments)    Pt and family report altered mental status.   Azithromycin Rash   Ciprofloxacin Nausea Only and Nausea And Vomiting   Hydromorphone  Hcl Other (See Comments)    Altered mental status Altered mental status   Levofloxacin  Nausea Only and Other (See Comments)   Lisinopril Other (See Comments)    Fatigue  Fatigue    Outpatient Encounter Medications as of 01/20/2024  Medication Sig   acetaminophen  (TYLENOL ) 500 MG tablet Take 1,000 mg by mouth 2 (two) times daily.    albuterol  (VENTOLIN  HFA) 108 (90 Base) MCG/ACT inhaler Inhale 1-2 puffs into the lungs every 6 (six) hours as needed for wheezing or shortness of breath. prn   atorvastatin  (LIPITOR) 20 MG tablet Take 1 tablet (20 mg total) by mouth daily.   Biotin 16109 MCG TABS  Take 1 tablet by mouth daily.   carvedilol  (COREG ) 12.5  MG tablet Take 1 tablet (12.5 mg total) by mouth 2 (two) times daily with a meal.   fexofenadine (ALLEGRA) 180 MG tablet Take 180 mg by mouth daily.   fish oil-omega-3 fatty acids 1000 MG capsule Take 1 g by mouth daily.    furosemide  (LASIX ) 20 MG tablet Take 20 mg by mouth every Monday, Wednesday, and Friday.   gabapentin  (NEURONTIN ) 300 MG capsule Take 1 capsule (300 mg total) by mouth 3 (three) times daily.   Glucosamine-Chondroit-Vit C-Mn (GLUCOSAMINE CHONDR 1500 COMPLX PO) Take 1 tablet by mouth once.   Hyoscyamine  Sulfate (HYOSCYAMINE  PO) Take 0.375 mg by mouth every 12 (twelve) hours as needed (for stomach pain or spasms).   ibuprofen  (ADVIL ) 200 MG tablet Take 200 mg by mouth every 8 (eight) hours as needed.   lactose free nutrition (BOOST) LIQD Take 237 mLs by mouth 2 (two) times daily.   mexiletine (MEXITIL ) 150 MG capsule Take 1 capsule (150 mg total) by mouth 2 (two) times daily.   Multiple Vitamin (MULTIVITAMIN) tablet Take 1 tablet by mouth daily.     nitroGLYCERIN  (NITROSTAT ) 0.4 MG SL tablet Place 0.4 mg under the tongue every 5 (five) minutes as needed for chest pain.   potassium chloride  (KLOR-CON  M) 10 MEQ tablet Take 1 tablet (10 mEq total) by mouth every other day.   umeclidinium-vilanterol (ANORO ELLIPTA ) 62.5-25 MCG/ACT AEPB INHALE 1 PUFF INTO THE LUNGS BY MOUTH ONCE DAILY   feeding supplement (ENSURE ENLIVE / ENSURE PLUS) LIQD Take 237 mLs by mouth 2 (two) times daily between meals. (Patient not taking: Reported on 01/20/2024)   furosemide  (LASIX ) 20 MG tablet Take 1 tablet (20 mg total) by mouth every other day. Take 1 tablet 3 times a week (Monday, Wednesday and Friday) as needed. (Patient not taking: Reported on 01/20/2024)   ibuprofen  (ADVIL ) 400 MG tablet Take 1 tablet (400 mg total) by mouth every 8 (eight) hours as needed. (Patient not taking: Reported on 01/20/2024)   No facility-administered encounter  medications on file as of 01/20/2024.    Review of Systems  Vitals:   01/20/24 0825  BP: (!) 135/58  Pulse: (!) 53  Weight: 114 lb 3.2 oz (51.8 kg)  Height: 5\' 3"  (1.6 m)   Body mass index is 20.23 kg/m. Physical Exam Physical Exam MEASUREMENTS: Weight- 114. GEN: Well- appearing, severe kyphosis ABDOMEN: Abdomen distended and firm, non-tender. EXTREMITIES: No swelling in legs.  Labs reviewed: Basic Metabolic Panel: Recent Labs    07/07/23 1604 09/30/23 2213 01/10/24 1959 01/11/24 1243 01/12/24 0600 01/12/24 2049 01/14/24 0538 01/18/24 0506  NA  --    < > 138 138   < > 137 142 138  K  --    < > 3.6 3.9   < > 4.4 4.3 4.0  CL  --    < > 102 101   < > 102 107 102  CO2  --    < > 27 28   < > 23 27 28   GLUCOSE  --    < > 93 98   < > 115* 86 98  BUN  --    < > 19 20   < > 28* 33* 28*  CREATININE  --    < > 0.71 0.91   < > 1.03* 0.96 0.75  CALCIUM   --    < > 10.3 10.8*   < > 10.4* 10.5* 10.7*  MG 2.2  --  2.1 2.0  --   --   --   --  PHOS  --   --   --  3.1  --   --   --   --    < > = values in this interval not displayed.   Liver Function Tests: Recent Labs    01/11/24 1243 01/12/24 2049 01/14/24 0538  AST 43* 45* 53*  ALT 32 28 29  ALKPHOS 91 100 90  BILITOT 1.0 1.0 1.4*  PROT 7.3 6.9 6.5  ALBUMIN 3.9 3.7 3.3*   Recent Labs    11/05/23 1442  LIPASE 44   No results for input(s): "AMMONIA" in the last 8760 hours. CBC: Recent Labs    01/11/24 1243 01/12/24 2049 01/14/24 0538 01/18/24 0506  WBC 5.6 5.1 6.5 3.9*  NEUTROABS 4.1  --   --   --   HGB 15.0 15.4* 14.9 13.8  HCT 44.7 47.5* 44.9 41.9  MCV 99.8 101.5* 99.6 100.0  PLT 136* 167 149* 151   Cardiac Enzymes: No results for input(s): "CKTOTAL", "CKMB", "CKMBINDEX", "TROPONINI" in the last 8760 hours. BNP: Invalid input(s): "POCBNP" No results found for: "HGBA1C" Lab Results  Component Value Date   TSH 5.503 (H) 01/11/2024   Lab Results  Component Value Date   VITAMINB12 462 09/24/2010    Lab Results  Component Value Date   FOLATE >20.0 ng/mL 09/24/2010   Lab Results  Component Value Date   IRON 85 09/24/2010    Imaging and Procedures obtained prior to SNF admission: CT PELVIS WO CONTRAST Result Date: 01/13/2024 CLINICAL DATA:  Hip trauma right hip pain fall EXAM: CT PELVIS WITHOUT CONTRAST TECHNIQUE: Multidetector CT imaging of the pelvis was performed following the standard protocol without intravenous contrast. RADIATION DOSE REDUCTION: This exam was performed according to the departmental dose-optimization program which includes automated exposure control, adjustment of the mA and/or kV according to patient size and/or use of iterative reconstruction technique. COMPARISON:  Radiograph 01/12/2024, CT 11/05/2023 FINDINGS: Urinary Tract: Partially obscured by artifact from right hip hardware. No acute abnormality Bowel: Diverticular disease of the sigmoid colon. No acute wall thickening Vascular/Lymphatic: Atherosclerosis. No aneurysm or suspicious lymph nodes Reproductive:  Negative for adnexal mass Other:  Negative for pelvic effusion Musculoskeletal: Right hip replacement with normal alignment. No definitive fracture is seen. No SI joint widening. Pubic symphysis and rami appear intact. IMPRESSION: 1. Right hip replacement with normal alignment. No definitive fracture is seen. 2. Diverticular disease of the sigmoid colon. Electronically Signed   By: Esmeralda Hedge M.D.   On: 01/13/2024 01:44   CT HEAD WO CONTRAST ( ) Result Date: 01/12/2024 CLINICAL DATA:  Head trauma, fell backwards and hit head. Recently started anticoagulation. EXAM: CT HEAD WITHOUT CONTRAST CT CERVICAL SPINE WITHOUT CONTRAST TECHNIQUE: Multidetector CT imaging of the head and cervical spine was performed following the standard protocol without intravenous contrast. Multiplanar CT image reconstructions of the cervical spine were also generated. RADIATION DOSE REDUCTION: This exam was performed according to the  departmental dose-optimization program which includes automated exposure control, adjustment of the mA and/or kV according to patient size and/or use of iterative reconstruction technique. COMPARISON:  CT head 11/05/2023. FINDINGS: CT HEAD FINDINGS Brain: No acute intracranial hemorrhage. No CT evidence of acute infarct. Nonspecific hypoattenuation in the periventricular and subcortical white matter favored to reflect chronic microvascular ischemic changes. No edema, mass effect, or midline shift. The basilar cisterns are patent. Ventricles: Prominence of the ventricles suggesting underlying parenchymal volume loss. Vascular: Atherosclerotic calcifications of the carotid siphons. No hyperdense vessel. Skull: No acute or aggressive finding.  Orbits: Orbits are symmetric. Sinuses: The visualized paranasal sinuses are clear. Other: Mastoid air cells are clear. CT CERVICAL SPINE FINDINGS Alignment: Straightening of the normal cervical lordosis. Stepwise trace anterolisthesis of C2 on C3, C3 on C4, C4 on C5, and C5 on C6. There is additional 3 mm anterolisthesis of C6 on C7. Trace anterolisthesis of C7 on T1. No facet subluxation or dislocation. Skull base and vertebrae: No compression fracture or displaced fracture in the cervical spine. No suspicious osseous lesion. Soft tissues and spinal canal: No prevertebral fluid or swelling. No visible canal hematoma. Disc levels: Moderate disc space narrowing at multiple levels. Disc osteophyte complexes throughout the cervical spine. There is no evidence of high-grade osseous spinal canal stenosis. Facet arthrosis and uncovertebral hypertrophy at multiple levels. Upper chest: Biapical pleuroparenchymal scarring.  Emphysema. Other: None. IMPRESSION: No CT evidence of acute intracranial abnormality. Slightly limited evaluation of the cervical spine due to artifact and positioning. Within these limitations there is no acute fracture or traumatic malalignment appreciated. Chronic  and degenerative changes as above. Emphysema (ICD10-J43.9). Electronically Signed   By: Denny Flack M.D.   On: 01/12/2024 21:56   CT Cervical Spine Wo Contrast Result Date: 01/12/2024 CLINICAL DATA:  Head trauma, fell backwards and hit head. Recently started anticoagulation. EXAM: CT HEAD WITHOUT CONTRAST CT CERVICAL SPINE WITHOUT CONTRAST TECHNIQUE: Multidetector CT imaging of the head and cervical spine was performed following the standard protocol without intravenous contrast. Multiplanar CT image reconstructions of the cervical spine were also generated. RADIATION DOSE REDUCTION: This exam was performed according to the departmental dose-optimization program which includes automated exposure control, adjustment of the mA and/or kV according to patient size and/or use of iterative reconstruction technique. COMPARISON:  CT head 11/05/2023. FINDINGS: CT HEAD FINDINGS Brain: No acute intracranial hemorrhage. No CT evidence of acute infarct. Nonspecific hypoattenuation in the periventricular and subcortical white matter favored to reflect chronic microvascular ischemic changes. No edema, mass effect, or midline shift. The basilar cisterns are patent. Ventricles: Prominence of the ventricles suggesting underlying parenchymal volume loss. Vascular: Atherosclerotic calcifications of the carotid siphons. No hyperdense vessel. Skull: No acute or aggressive finding. Orbits: Orbits are symmetric. Sinuses: The visualized paranasal sinuses are clear. Other: Mastoid air cells are clear. CT CERVICAL SPINE FINDINGS Alignment: Straightening of the normal cervical lordosis. Stepwise trace anterolisthesis of C2 on C3, C3 on C4, C4 on C5, and C5 on C6. There is additional 3 mm anterolisthesis of C6 on C7. Trace anterolisthesis of C7 on T1. No facet subluxation or dislocation. Skull base and vertebrae: No compression fracture or displaced fracture in the cervical spine. No suspicious osseous lesion. Soft tissues and spinal canal:  No prevertebral fluid or swelling. No visible canal hematoma. Disc levels: Moderate disc space narrowing at multiple levels. Disc osteophyte complexes throughout the cervical spine. There is no evidence of high-grade osseous spinal canal stenosis. Facet arthrosis and uncovertebral hypertrophy at multiple levels. Upper chest: Biapical pleuroparenchymal scarring.  Emphysema. Other: None. IMPRESSION: No CT evidence of acute intracranial abnormality. Slightly limited evaluation of the cervical spine due to artifact and positioning. Within these limitations there is no acute fracture or traumatic malalignment appreciated. Chronic and degenerative changes as above. Emphysema (ICD10-J43.9). Electronically Signed   By: Denny Flack M.D.   On: 01/12/2024 21:56   DG HIP UNILAT WITH PELVIS 2-3 VIEWS RIGHT Result Date: 01/12/2024 CLINICAL DATA:  Right hip pain after fall. EXAM: DG HIP (WITH OR WITHOUT PELVIS) 2-3V RIGHT COMPARISON:  None Available. FINDINGS: Right hip  arthroplasty is intact. No periprosthetic fracture or lucency. No acute pelvic fracture. Pubic rami are intact. Pubic symphysis and sacroiliac joints are congruent. The bones are subjectively under mineralized. IMPRESSION: 1. No acute fracture or dislocation of the pelvis or right hip. 2. Right hip arthroplasty without complication. Electronically Signed   By: Chadwick Colonel M.D.   On: 01/12/2024 21:46   DG Chest 1 View Result Date: 01/12/2024 CLINICAL DATA:  Congestive heart failure. EXAM: CHEST  1 VIEW COMPARISON:  01/11/2024 FINDINGS: Status post median sternotomy and CABG procedure. Stable cardiac enlargement. No pleural effusion identified. No frank interstitial edema. Advanced changes of COPD/emphysema with upper lobe predominant scarring and architectural distortion. Chronic upper lobe predominant scarring. No acute osseous findings. Surgical clips noted in the left axilla. IMPRESSION: 1. No acute findings. 2. Advanced changes of COPD/emphysema.  Electronically Signed   By: Kimberley Penman M.D.   On: 01/12/2024 08:23    Assessment/Plan  Generalized Weakness   Experienced generalized weakness with flu-like symptoms, leading to hospitalization. Weakness improved, currently in rehabilitation to regain strength. No falls associated with weakness, but had a fall prior.   - Continue rehabilitation therapy to improve strength and mobility   - Encourage use of rollator for ambulation to prevent falls    Fall with Contusion   Experienced a fall resulting in contusions and severe bruising on hips, causing significant pain and difficulty sitting or lying on back. No fractures identified. Pain improved but some soreness persists.   - Continue pain management as needed   - Encourage safe mobility practices to prevent future falls    Atrial Fibrillation   Diagnosed with atrial fibrillation, hospitalized for management, and started on Eliquis  for anticoagulation. Experienced confusion due to medication during hospitalization, now resolved.   - Continue Eliquis  for anticoagulation   - Monitor for any signs of bleeding or adverse effects from anticoagulation    Chronic Obstructive Pulmonary Disease (COPD)   COPD managed with inhalers. Reports no current breathing problems, inhalers effective. Annual CT scan for monitoring discussed.   - Continue current inhaler regimen   - Schedule annual CT scan for COPD monitoring    Pulmonary Nodule   Pulmonary nodule in right lung monitored for several years with no significant changes. CT scan scheduled to follow up on nodule.   - Proceed with scheduled CT scan of the chest to monitor pulmonary nodule    Unintentional Weight Loss   Significant weight loss of approximately 15-20 pounds over the past six months. Attributes to aging, but concerned about potential muscle mass loss. History of breast cancer, no recurrence.   - Encourage increased protein intake, aiming for 30 grams of protein per meal   -  Consider using Ensure for additional protein supplementation    Irritable Bowel Syndrome (IBS)   IBS with episodes of severe abdominal pain. Uses hyoscyamine  as needed for symptom relief.   - Continue hyoscyamine  as needed for IBS symptoms    Goals of Care   She and her husband have signed papers indicating she does not wish to undergo CPR. She prefers to evaluate the cause of any health deterioration before deciding on hospitalization. She wishes to consider the cause of worsening health and decide on the path to take with her husband's input.   - Ensure documentation of DNR status is available and updated   - Discuss and document preferences for hospitalization and treatment interventions    Follow-up   Currently in a rehabilitation facility, receiving therapy five  times a week. Laboratory studies scheduled for the following day.   - Conduct laboratory studies on the next scheduled day   - Ensure therapy sessions are conducted five times a week   - Monitor progress and adjust rehabilitation plan as needed  Family/ staff Communication: Nursing, spouse  Labs/tests ordered:BMP, CBC  I spent greater than 35  minutes for the care of this patient in face to face time, chart review, clinical documentation, patient education. I spent an additional 16 minutes discussing goals of care and advanced care planning.

## 2024-01-21 DIAGNOSIS — Z515 Encounter for palliative care: Secondary | ICD-10-CM | POA: Diagnosis not present

## 2024-01-22 ENCOUNTER — Encounter: Payer: Self-pay | Admitting: Student

## 2024-01-22 ENCOUNTER — Ambulatory Visit: Payer: Self-pay

## 2024-01-22 NOTE — Telephone Encounter (Signed)
 Reason for Disposition . MODERATE-SEVERE rectal pain (i.e., interferes with school, work, or sleep)  Protocols used: Rectal Symptoms-A-AH

## 2024-01-22 NOTE — Telephone Encounter (Addendum)
  Chief Complaint: Hemorrhoid Symptoms: hemorrhoid  Frequency: found today Pertinent Negatives: Patient denies  Disposition: [] ED /[] Urgent Care (no appt availability in office) / [] Appointment(In office/virtual)/ []  [facility]/ [] Home Care/ [] Refused Recommended Disposition /[] [facility]/ [x]  Follow-up with PCP Additional Notes: Todd from [facility] calling with concerns of patient having a hemorrhoid. States patient has a chick pea sized hemorrhoid around her anus. RN is asking for preparation H order.    Copied from CRM [id number]. Topic: Clinical - Red Word Triage >> Jan 22, 2024 11:29 AM Tiffany H wrote: [facility] that prompted transfer to Nurse Triage: Todd with [facility] called to advise that patient has a chickpea-sized hemmroid on/around her anus. Advised that it's painful. Patient was trying to have Todd "push it back in" but he declined. Would like an order for ointment. Please assist.   Dr. Valrie Gehrig is Medical Director. Answer Assessment - Initial Assessment Questions 1. SYMPTOM:  "What's the main symptom you're concerned about?" (e.g., pain, itching, swelling, rash)     External hemorrhoid 2. ONSET: "When did the hemorrhoid  start?"     called 3. RECTAL PAIN: "Do you have any pain around your rectum?" "How bad is the pain?"  (Scale 0-10; or mild, moderate, severe)   - NONE (0): no pain   - MILD (1-3): doesn't interfere with normal activities    - MODERATE (4-7): interferes with normal activities or awakens from sleep, limping    - SEVERE (8-10): excruciating pain, unable to have a bowel movement      Mild 4. RECTAL ITCHING: "Do you have any itching in this area?" "How bad is the itching?"  (Scale 0-10; or mild, moderate, severe)   - NONE: no itching   - MILD: doesn't interfere with normal activities    - MODERATE-SEVERE: interferes with normal activities or awakens from sleep     no 5. CONSTIPATION: "Do you have  constipation?" If Yes, ask: "How bad is it?"     no 6. CAUSE: "What do you think is causing the anus symptoms?"     hemmorhoid 7. OTHER SYMPTOMS: "Do you have any other symptoms?"  (e.g., abdomen pain, fever, rectal bleeding, vomiting)     No  Protocols used: Rectal Symptoms-A-AH

## 2024-01-25 NOTE — Telephone Encounter (Signed)
 Forwarded message to Dr. Jann Melody.  Patient is in Skilled Nursing at [facility].

## 2024-01-26 ENCOUNTER — Encounter: Payer: Self-pay | Admitting: Nurse Practitioner

## 2024-01-26 NOTE — Progress Notes (Signed)
 Location:  Other [place].  Nursing Home Room Number: [facility] 112A Place of Service:  SNF (31) [facility], NP  PCP: Karalee Oscar, [place]  Patient Care Team: [place], [place] as PCP - General (Internal Medicine) End, Veryl Gottron, MD as PCP - Cardiology (Cardiology) Ardeen Kohler, MD as PCP - Electrophysiology (Cardiology) Tessa Figures, MD as Attending Physician (Gastroenterology) Armand Lamy., MD (Obstetrics and Gynecology) Opal Bill, MD (Inactive) (Radiation Oncology) Florance Hun, MD (Hematology and Oncology)  Extended Emergency Contact Information Primary Emergency Contact: [facility] Address: [address]          [place], [place] [address] [place] Mobile Phone: [phone] Relation: Spouse Secondary Emergency Contact: Thon,John Address: [address]          [place], [place] [address] [place] Home Phone: [phone] Relation: Son  Goals of care: Advanced Directive information    01/12/2024    6:00 AM  Advanced Directives  Does Patient Have a Medical Advance Directive? Yes  Type of Advance Directive Living will  Does patient want to make changes to medical advance directive? No - Patient declined     Chief Complaint  Patient presents with   Discharge Note    Discharge.     HPI:  Pt is a 83 y.o. female seen today for Discharge.    Past Medical History:  Diagnosis Date   Allergy    Arthritis    Breast cancer (HCC)    left breast   Coronary artery disease 2001   CABG   DDD (degenerative disc disease), lumbosacral    Dyspnea    if rushing or climmbing lots of stairs   Emphysema lung (HCC)    emphysema   GERD (gastroesophageal reflux disease)    11/20/16- no a current problem   Hyperlipidemia    Hypertension    Irritable bowel syndrome (IBS)    Lung disorder    leison   Neuropathy    Pinched nerve    back and left leg   Pneumonia 2016   Past Surgical History:   Procedure Laterality Date   BREAST FIBROADENOMA SURGERY  01/13/1980   BREAST SURGERY  02/01/2004   tram/mastectomyfor recurrence   CARPOMETACARPEL SUSPENSION PLASTY Right 12/17/2017   Procedure: SUSPENSIONPLASTY RIGHT THUMB WITH EXCISION TRAPEZIUM;  Surgeon: Lyanne Sample, MD;  Location: [facility];  Service: Orthopedics;  Laterality: Right;   COLONOSCOPY     CORONARY ANGIOPLASTY  2001   had tear to two vessels- had to have open heart surgery   CORONARY ARTERY BYPASS GRAFT  2001   EYE SURGERY Bilateral    Cataract   KNEE ARTHROSCOPY  January 2011   right   MASTECTOMY PARTIAL / LUMPECTOMY W/ AXILLARY LYMPHADENECTOMY  04/10/1989   Dr Linder Revere / left breast   PARTIAL HYSTERECTOMY     vaginal   RIGHT/LEFT HEART CATH AND CORONARY ANGIOGRAPHY Bilateral 06/17/2023   Procedure: RIGHT/LEFT HEART CATH AND CORONARY ANGIOGRAPHY;  Surgeon: Sammy Crisp, MD;  Location: [facility];  Service: Cardiovascular;  Laterality: Bilateral;   TENDON TRANSFER Right 12/17/2017   Procedure: ABDUCTOR POLLICIS LONGUS TRANSFER;  Surgeon: Lyanne Sample, MD;  Location: [facility];  Service: Orthopedics;  Laterality: Right;   TONSILLECTOMY AND ADENOIDECTOMY     TOTAL HIP ARTHROPLASTY Right 08/05/2016   Procedure: TOTAL HIP ARTHROPLASTY ANTERIOR APPROACH;  Surgeon: Dayne Even, MD;  Location: [facility];  Service: Orthopedics;  Laterality: Right;  UPPER GASTROINTESTINAL ENDOSCOPY     VIDEO BRONCHOSCOPY WITH ENDOBRONCHIAL NAVIGATION N/A 11/27/2016   Procedure: VIDEO BRONCHOSCOPY WITH ENDOBRONCHIAL NAVIGATION;  Surgeon: Zelphia Higashi, MD;  Location: [facility] OR;  Service: Thoracic;  Laterality: N/A;    Allergies  Allergen Reactions   Amlodipine  Swelling    Leg swelling and fatigue.   Hydromorphone  Other (See Comments)    ALTERED MENTAL STATUS Altered mental status   Morphine  Other (See Comments)    Pt and family report altered mental status.   Azithromycin Rash   Ciprofloxacin  Nausea Only and Nausea And Vomiting   Hydromorphone  Hcl Other (See Comments)    Altered mental status Altered mental status   Levofloxacin  Nausea Only and Other (See Comments)   Lisinopril Other (See Comments)    Fatigue  Fatigue    Outpatient Encounter Medications as of 01/26/2024  Medication Sig   acetaminophen  (TYLENOL ) 500 MG tablet Take 1,000 mg by mouth 2 (two) times daily.    albuterol  (VENTOLIN  HFA) 108 (90 Base) MCG/ACT inhaler Inhale 1-2 puffs into the lungs every 6 (six) hours as needed for wheezing or shortness of breath. prn   atorvastatin  (LIPITOR) 20 MG tablet Take 1 tablet (20 mg total) by mouth daily.   Biotin 19147 MCG TABS Take 1 tablet by mouth daily.   carvedilol  (COREG ) 12.5 MG tablet Take 1 tablet (12.5 mg total) by mouth 2 (two) times daily with a meal.   fexofenadine (ALLEGRA) 180 MG tablet Take 180 mg by mouth daily.   fish oil-omega-3 fatty acids 1000 MG capsule Take 1 g by mouth daily.    furosemide  (LASIX ) 20 MG tablet Take 20 mg by mouth every Monday, Wednesday, and Friday.   gabapentin  (NEURONTIN ) 300 MG capsule Take 1 capsule (300 mg total) by mouth 3 (three) times daily.   Glucosamine-Chondroit-Vit C-Mn (GLUCOSAMINE CHONDR 1500 COMPLX PO) Take 1 tablet by mouth once.   Hyoscyamine  Sulfate (HYOSCYAMINE  PO) Take 0.375 mg by mouth every 12 (twelve) hours as needed (for stomach pain or spasms).   lactose free nutrition (BOOST) LIQD Take 237 mLs by mouth 2 (two) times daily.   mexiletine (MEXITIL ) 150 MG capsule Take 1 capsule (150 mg total) by mouth 2 (two) times daily.   Multiple Vitamin (MULTIVITAMIN) tablet Take 1 tablet by mouth daily.     nitroGLYCERIN  (NITROSTAT ) 0.4 MG SL tablet Place 0.4 mg under the tongue every 5 (five) minutes as needed for chest pain.   potassium chloride  (KLOR-CON  M) 10 MEQ tablet Take 1 tablet (10 mEq total) by mouth every other day. (Patient taking differently: Take 10 mEq by mouth every other day. Mon, Wed Friday.)    umeclidinium-vilanterol (ANORO ELLIPTA ) 62.5-25 MCG/ACT AEPB INHALE 1 PUFF INTO THE LUNGS BY MOUTH ONCE DAILY   ibuprofen  (ADVIL ) 200 MG tablet Take 200 mg by mouth every 8 (eight) hours as needed. (Patient not taking: Reported on 01/26/2024)   No facility-administered encounter medications on file as of 01/26/2024.    Review of Systems   Immunization History  Administered Date(s) Administered   Fluad Trivalent(High Dose 65+) 07/22/2023   Influenza, High Dose Seasonal PF 08/02/2018, 08/07/2019, 08/06/2021   Influenza,inj,Quad PF,6+ Mos 07/26/2015   Influenza-Unspecified 08/15/2014, 07/08/2019, 07/04/2022   Moderna SARS-COV2 Booster Vaccination 08/17/2020   Moderna Sars-Covid-2 Vaccination 10/18/2019, 11/15/2019   Pneumococcal Polysaccharide-23 07/01/2018   Pneumococcal-Unspecified 06/15/2014   Zoster Recombinant(Shingrix) 03/20/2018, 08/22/2018   Pertinent  Health Maintenance Due  Topic Date Due   DEXA SCAN  Never done  Colonoscopy  02/27/2020   INFLUENZA VACCINE  05/06/2024      05/01/2015   10:13 AM 07/21/2019    6:45 PM 08/04/2019   12:02 AM 08/11/2022    5:49 PM  Fall Risk  Falls in the past year? No     (RETIRED) Patient Fall Risk Level  Low fall risk Low fall risk Low fall risk   Functional Status Survey:    Vitals:   01/26/24 1109 01/26/24 1122  BP: (!) 175/75 (!) 167/56  Pulse: (!) 55   Resp: 20   Temp: 97.6 F (36.4 C)   SpO2: 95%   Weight: 116 lb 3.2 oz (52.7 kg)   Height: 5\' 3"  (1.6 m)    Body mass index is 20.58 kg/m. Physical Exam  Labs reviewed: Recent Labs    07/07/23 1604 09/30/23 2213 01/10/24 1959 01/11/24 1243 01/12/24 0600 01/12/24 2049 01/14/24 0538 01/18/24 0506  NA  --    < > 138 138   < > 137 142 138  K  --    < > 3.6 3.9   < > 4.4 4.3 4.0  CL  --    < > 102 101   < > 102 107 102  CO2  --    < > 27 28   < > 23 27 28   GLUCOSE  --    < > 93 98   < > 115* 86 98  BUN  --    < > 19 20   < > 28* 33* 28*  CREATININE  --    < > 0.71  0.91   < > 1.03* 0.96 0.75  CALCIUM   --    < > 10.3 10.8*   < > 10.4* 10.5* 10.7*  MG 2.2  --  2.1 2.0  --   --   --   --   PHOS  --   --   --  3.1  --   --   --   --    < > = values in this interval not displayed.   Recent Labs    01/11/24 1243 01/12/24 2049 01/14/24 0538  AST 43* 45* 53*  ALT 32 28 29  ALKPHOS 91 100 90  BILITOT 1.0 1.0 1.4*  PROT 7.3 6.9 6.5  ALBUMIN 3.9 3.7 3.3*   Recent Labs    01/11/24 1243 01/12/24 2049 01/14/24 0538 01/18/24 0506  WBC 5.6 5.1 6.5 3.9*  NEUTROABS 4.1  --   --   --   HGB 15.0 15.4* 14.9 13.8  HCT 44.7 47.5* 44.9 41.9  MCV 99.8 101.5* 99.6 100.0  PLT 136* 167 149* 151   Lab Results  Component Value Date   TSH 5.503 (H) 01/11/2024   No results found for: "HGBA1C" Lab Results  Component Value Date   CHOL 145 06/01/2023   HDL 67 06/01/2023   LDLCALC 67 06/01/2023   TRIG 53 06/01/2023   CHOLHDL 2.2 06/01/2023    Significant Diagnostic Results in last 30 days:  CT PELVIS WO CONTRAST Result Date: 01/13/2024 CLINICAL DATA:  Hip trauma right hip pain fall EXAM: CT PELVIS WITHOUT CONTRAST TECHNIQUE: Multidetector CT imaging of the pelvis was performed following the standard protocol without intravenous contrast. RADIATION DOSE REDUCTION: This exam was performed according to the departmental dose-optimization program which includes automated exposure control, adjustment of the mA and/or kV according to patient size and/or use of iterative reconstruction technique. COMPARISON:  Radiograph 01/12/2024, CT 11/05/2023 FINDINGS: Urinary Tract: Partially  obscured by artifact from right hip hardware. No acute abnormality Bowel: Diverticular disease of the sigmoid colon. No acute wall thickening Vascular/Lymphatic: Atherosclerosis. No aneurysm or suspicious lymph nodes Reproductive:  Negative for adnexal mass Other:  Negative for pelvic effusion Musculoskeletal: Right hip replacement with normal alignment. No definitive fracture is seen. No SI joint  widening. Pubic symphysis and rami appear intact. IMPRESSION: 1. Right hip replacement with normal alignment. No definitive fracture is seen. 2. Diverticular disease of the sigmoid colon. Electronically Signed   By: Esmeralda Hedge M.D.   On: 01/13/2024 01:44   CT HEAD WO CONTRAST ( ) Result Date: 01/12/2024 CLINICAL DATA:  Head trauma, fell backwards and hit head. Recently started anticoagulation. EXAM: CT HEAD WITHOUT CONTRAST CT CERVICAL SPINE WITHOUT CONTRAST TECHNIQUE: Multidetector CT imaging of the head and cervical spine was performed following the standard protocol without intravenous contrast. Multiplanar CT image reconstructions of the cervical spine were also generated. RADIATION DOSE REDUCTION: This exam was performed according to the departmental dose-optimization program which includes automated exposure control, adjustment of the mA and/or kV according to patient size and/or use of iterative reconstruction technique. COMPARISON:  CT head 11/05/2023. FINDINGS: CT HEAD FINDINGS Brain: No acute intracranial hemorrhage. No CT evidence of acute infarct. Nonspecific hypoattenuation in the periventricular and subcortical white matter favored to reflect chronic microvascular ischemic changes. No edema, mass effect, or midline shift. The basilar cisterns are patent. Ventricles: Prominence of the ventricles suggesting underlying parenchymal volume loss. Vascular: Atherosclerotic calcifications of the carotid siphons. No hyperdense vessel. Skull: No acute or aggressive finding. Orbits: Orbits are symmetric. Sinuses: The visualized paranasal sinuses are clear. Other: Mastoid air cells are clear. CT CERVICAL SPINE FINDINGS Alignment: Straightening of the normal cervical lordosis. Stepwise trace anterolisthesis of C2 on C3, C3 on C4, C4 on C5, and C5 on C6. There is additional 3 mm anterolisthesis of C6 on C7. Trace anterolisthesis of C7 on T1. No facet subluxation or dislocation. Skull base and vertebrae: No  compression fracture or displaced fracture in the cervical spine. No suspicious osseous lesion. Soft tissues and spinal canal: No prevertebral fluid or swelling. No visible canal hematoma. Disc levels: Moderate disc space narrowing at multiple levels. Disc osteophyte complexes throughout the cervical spine. There is no evidence of high-grade osseous spinal canal stenosis. Facet arthrosis and uncovertebral hypertrophy at multiple levels. Upper chest: Biapical pleuroparenchymal scarring.  Emphysema. Other: None. IMPRESSION: No CT evidence of acute intracranial abnormality. Slightly limited evaluation of the cervical spine due to artifact and positioning. Within these limitations there is no acute fracture or traumatic malalignment appreciated. Chronic and degenerative changes as above. Emphysema (ICD10-J43.9). Electronically Signed   By: Denny Flack M.D.   On: 01/12/2024 21:56   CT Cervical Spine Wo Contrast Result Date: 01/12/2024 CLINICAL DATA:  Head trauma, fell backwards and hit head. Recently started anticoagulation. EXAM: CT HEAD WITHOUT CONTRAST CT CERVICAL SPINE WITHOUT CONTRAST TECHNIQUE: Multidetector CT imaging of the head and cervical spine was performed following the standard protocol without intravenous contrast. Multiplanar CT image reconstructions of the cervical spine were also generated. RADIATION DOSE REDUCTION: This exam was performed according to the departmental dose-optimization program which includes automated exposure control, adjustment of the mA and/or kV according to patient size and/or use of iterative reconstruction technique. COMPARISON:  CT head 11/05/2023. FINDINGS: CT HEAD FINDINGS Brain: No acute intracranial hemorrhage. No CT evidence of acute infarct. Nonspecific hypoattenuation in the periventricular and subcortical white matter favored to reflect chronic microvascular ischemic changes. No edema,  mass effect, or midline shift. The basilar cisterns are patent. Ventricles:  Prominence of the ventricles suggesting underlying parenchymal volume loss. Vascular: Atherosclerotic calcifications of the carotid siphons. No hyperdense vessel. Skull: No acute or aggressive finding. Orbits: Orbits are symmetric. Sinuses: The visualized paranasal sinuses are clear. Other: Mastoid air cells are clear. CT CERVICAL SPINE FINDINGS Alignment: Straightening of the normal cervical lordosis. Stepwise trace anterolisthesis of C2 on C3, C3 on C4, C4 on C5, and C5 on C6. There is additional 3 mm anterolisthesis of C6 on C7. Trace anterolisthesis of C7 on T1. No facet subluxation or dislocation. Skull base and vertebrae: No compression fracture or displaced fracture in the cervical spine. No suspicious osseous lesion. Soft tissues and spinal canal: No prevertebral fluid or swelling. No visible canal hematoma. Disc levels: Moderate disc space narrowing at multiple levels. Disc osteophyte complexes throughout the cervical spine. There is no evidence of high-grade osseous spinal canal stenosis. Facet arthrosis and uncovertebral hypertrophy at multiple levels. Upper chest: Biapical pleuroparenchymal scarring.  Emphysema. Other: None. IMPRESSION: No CT evidence of acute intracranial abnormality. Slightly limited evaluation of the cervical spine due to artifact and positioning. Within these limitations there is no acute fracture or traumatic malalignment appreciated. Chronic and degenerative changes as above. Emphysema (ICD10-J43.9). Electronically Signed   By: Denny Flack M.D.   On: 01/12/2024 21:56   DG HIP UNILAT WITH PELVIS 2-3 VIEWS RIGHT Result Date: 01/12/2024 CLINICAL DATA:  Right hip pain after fall. EXAM: DG HIP (WITH OR WITHOUT PELVIS) 2-3V RIGHT COMPARISON:  None Available. FINDINGS: Right hip arthroplasty is intact. No periprosthetic fracture or lucency. No acute pelvic fracture. Pubic rami are intact. Pubic symphysis and sacroiliac joints are congruent. The bones are subjectively under  mineralized. IMPRESSION: 1. No acute fracture or dislocation of the pelvis or right hip. 2. Right hip arthroplasty without complication. Electronically Signed   By: Chadwick Colonel M.D.   On: 01/12/2024 21:46   DG Chest 1 View Result Date: 01/12/2024 CLINICAL DATA:  Congestive heart failure. EXAM: CHEST  1 VIEW COMPARISON:  01/11/2024 FINDINGS: Status post median sternotomy and CABG procedure. Stable cardiac enlargement. No pleural effusion identified. No frank interstitial edema. Advanced changes of COPD/emphysema with upper lobe predominant scarring and architectural distortion. Chronic upper lobe predominant scarring. No acute osseous findings. Surgical clips noted in the left axilla. IMPRESSION: 1. No acute findings. 2. Advanced changes of COPD/emphysema. Electronically Signed   By: Kimberley Penman M.D.   On: 01/12/2024 08:23   DG Chest Portable 1 View Result Date: 01/11/2024 CLINICAL DATA:  Palpitations.  Chest radiograph dated 11/02/2023. EXAM: PORTABLE CHEST 1 VIEW COMPARISON:  Stable cardiomegaly.  Prior median sternotomy and CABG. FINDINGS: Patient's chin obscures evaluation of the bilateral paramedian lung apices. Stable cardiomegaly. Prior median sternotomy and CABG. Aortic atherosclerosis. Emphysematous changes again noted. No focal consolidation, pleural effusion, or pneumothorax. Left axillary surgical clips. No acute osseous abnormality. IMPRESSION: 1. No acute cardiopulmonary findings. 2. Stable cardiomegaly. 3. Emphysema. Electronically Signed   By: Mannie Seek M.D.   On: 01/11/2024 15:06    Assessment/Plan No problem-specific Assessment & Plan notes found for this encounter.     Laniya Friedl K. Denney Fisherman [facility] [phone]

## 2024-01-26 NOTE — Progress Notes (Signed)
 This encounter was created in error - please disregard.

## 2024-01-30 NOTE — Progress Notes (Deleted)
 Cardiology Clinic Note   Date: 01/30/2024 ID: Tiffany Caldwell, Tiffany Caldwell 06-12-1941, MRN [id number]  Primary Cardiologist:  Sammy Crisp, MD  Chief Complaint   Tiffany Caldwell is a 83 y.o. female who presents to the clinic today for ***  Patient Profile   Tiffany Caldwell is followed by *** for the history outlined below.       Past medical history significant for: CAD. LHC 12/16/1999 (abnormal stress test): Mid LAD 80 to 90%.  PCI with stent to LAD. LHC 05/28/2000 (abnormal stress test): Proximal RCA 30 to 40%.  Diffuse in-stent restenosis greatest degree 80 to 90% mid LAD.  PTCA and stent placement proximal LAD with good angiographic result.  Angiography and orthogonal views showed a proximal dissection of the LAD which was significantly obstructive.  An additional stent was deployed in the proximal portion of the LAD resulting in wide patency of the LAD however retrograde movement of the dissection into the distal portion of the left main.  The lesion was observed over approximately 30 minutes and the degree of stenosis increased from trivial to approximately 50%.  The patient remained hemodynamically stable and without any evidence of EKG changes or chest discomfort.  CT surgery was consulted and decision was made to proceed with semiurgent bypass due to the unpredictable nature of the lesion. CABG x 3 05/29/2000: LIMA to LAD, SVG to D1, SVG to OM1. R/LHC 06/17/2023 (DOE/pulmonary hypertension): Severe single-vessel CAD with CTO of proximal/mid LAD.  Widely patent LIMA to LAD.  Chronically occluded SVG to D1 and SVG to OM1.  60 to 78% ostial OM1.  50% mid LCx.  30% proximal RCA.  Recommend medical management. Chronic diastolic heart failure/pulmonary hypertension. Echo 05/12/2023: EF 55 to 60%.  No RWMA.  Severe asymmetric LVH of the basal-septal segment.  Grade II DD.  Low normal RV function, normal RV size.  Moderately elevated PA pressure, RVSP 59.3 mmHg.  Severe LAE.  Mild to moderate  RAE.  Mild to moderate MR.  Mild AI.  Aortic valve sclerosis without stenosis.  Dilated IVC, RA pressure 8 mmHg. RHC 06/17/2023: Normal left and right heart filling pressures.  Mild to moderate pulmonary hypertension.  Normal Fick cardiac output/index.  Recommendation for follow-up with pulmonology to manage chronic lung disease likely driving chronic DOE and mild to moderate pulmonary hypertension.  If symptoms persist would consider cardiac MRI to better evaluate severe asymmetric LVH. Frequent PVCs. 14-day ZIO 08/03/2023: HR 42 to 86 bpm, average 62 bpm.  3 episodes of NSVT lasting up to 9 beats with max rate 176 bpm.  41 SVT runs lasting up to 16 beats with max rate 164 bpm.  Occasional PACs 4.2% burden.  Frequent PVCs 22.7% burden. Hypertension. Hyperlipidemia. Lipid panel 06/01/2023: LDL 67, HDL 67, TG 53, total 145. COPD. GERD. CKD stage IIIa. Breast cancer. S/p left mastectomy 02/02/2004.  In summary, patient has a history of CAD s/p PCI with stent to LAD March 2001.  In August 2001 she developed anginal symptoms and had a positive stress test.  LHC showed diffuse in-stent restenosis 80 to 90% in the mid LAD.  She underwent PTCA and stent placement to the proximal LAD with subsequent dissection and urgent CABG as detailed above.  Lexiscan  in 2020 showed no significant ischemia and was overall low risk.  Echo at that time showed EF 65 to 70%, Grade I DD, mild AI.  In 2022 she was taken off of amlodipine  secondary to lower extremity edema with improvement to  symptoms.  In May 2024 she noted an outside office discontinued HCTZ secondary to calcium  levels.  She was placed back on amlodipine  and had recurrence of lower extremity edema.  Amlodipine  was discontinued and she was started on carvedilol  and telmisartan was continued.  In July 2024 she reported improvement in lower extremity edema and stable chronic dyspnea.  She was started on Lasix  3 days a week.  Echo August 2024 showed normal LV function  and moderate pulmonary hypertension as detailed above.  Due to findings of echo she underwent R/LHC (see details above).  It was recommended patient follow-up with pulmonology to manage chronic lung disease likely driving pulmonary hypertension.  Upon follow-up in October 2024 patient complained of increased fatigue.  EKG demonstrated frequent PVCs.  2 weeks ZIO showed frequent PVCs with 22.7% burden as detailed above.  Patient was evaluated by Dr. Daneil Dunker in EP on 10/13/2023 who started mexiletine and changed carvedilol  to Toprol  twice daily with plan to repeat ZIO in 1 month.  Patient was last seen in the office by Cadence Furth, PA-C on 10/23/2023.  She had been seen in the ED for elevated BP and fatigue with BP 178/106 upon arrival.  Troponin mildly elevated to 40.  No anginal symptoms reported.  Patient was given IV fluids and discharged home.  On follow-up she was feeling improved.  Her BP at home continued to be mildly elevated.  BP at the time of her visit was 130/72.  No medication changes were made.  Patient was evaluated in the ED on 11/01/2023 for elevated BP.  BP 187/87 upon arrival.  Troponin 24>> 26.  Labs otherwise unremarkable.  Chest x-ray showed patchy airspace opacities in the right middle lobe concerning for pneumonia.  She was discharged with p.o. antibiotics.  Patient contacted the office on 11/04/2023 to report PCP changed patient from Toprol  back to carvedilol .  Patient presented to the ED via EMS on 11/05/2023 with lethargy and vomiting.  Upon arrival BP 180/83.  She was hypoxic at 86% and placed on 2 L.  Initial labs: Initial labs: Sodium 143, potassium 4.4, creatinine 0.98, BUN 28, eGFR 57, AST 58, ALT 53, WBC 3.6, hemoglobin 14.6, BNP 855.4.  Troponin 55.  Respiratory panel negative for COVID, RSV, flu.  CT head showed no acute intracranial process.  CT chest abdomen pelvis showed cholelithiasis without complicating factors, periportal lucencies suspicious for mild underlying hepatic  inflammation, diverticulosis without diverticulitis.  Patient was discharged on 11/08/2023.   Patient was last seen in the office by me on 12/01/2023 for hospital follow-up.  She reported resolving lower extremity edema managed with Lasix  3 times a week.  Chronic dyspnea from COPD was stable and unchanged.  EKG performed in ED was documented as A-fib and this was reviewed with Dr. Alvenia Aus who felt despite poor quality of EKG could be A-fib.  It was agreed to not start patient on anticoagulation.  Patient was instructed to keep previously scheduled cardiac MRI and follow-up with Dr. Daneil Dunker.  MRI was not performed in March and appears to have been rescheduled for the end of May.  Patient presented to the ED via EMS on 01/10/2024 with complaints of generalized weakness and elevated BP.  In triage BP 184/134.  EMS reported variable heart rate from 60-120s en route.  EKG performed was read as A-fib and patient was discharged on 01/12/2024 with dose adjusted Eliquis  and increased dose of carvedilol .  Patient was readmitted to the hospital on 01/12/2024 after falling backwards getting out  of the car (after arriving home from hospital discharge) and hitting her head.  Imaging was unremarkable.  She was discharged on 01/18/2024.  Patient and husband made the decision to stop oral anticoagulation secondary to high risk for recurrent falls.     History of Present Illness    Today, patient ***  CAD S/p PCI with stent to LAD March 2001.  August 2001 LHC showed and stent restenosis mid LAD.  Patient underwent PTCA and stent placement of proximal LAD with subsequent dissection and urgent CABG x 3.  [facility] September 2024 showed severe single-vessel CAD with CTO of proximal/mid LAD, widely patent LIMA to LAD, CTO of SVG to D1 and SVG to OM1 with recommendations to treat medically.  Patient denies chest pain, pressure or tightness. She is very active around her home.*** -Continue aspirin ***, atorvastatin , carvedilol , as needed SL  NTG.   Chronic diastolic heart failure/pulmonary hypertension Echo August 2024 showed EF 55 to 60%, low normal RV function/normal RV size, severe asymmetric LVH of the basal-septal segment, Grade II DD, moderately elevated PA pressure, RVSP 59.3 mmHg, BAE, mild to moderate MR, mild AI.  RHC September 2024 showed mild to moderate pulmonary hypertension with normal right heart and left heart filling pressures.  Patient *** Euvolemic and well compensated on exam.  -Continue carvedilol , Lasix . -Pending cardiac MRI scheduled for 03/02/2024.   Frequent PVCs/questionable A-fib 14-day ZIO October 2024 showed frequent PVCs with 22.7% burden, occasional PACs 4.2% burden.  Patient reports experiencing palpitations for years. Discussed EKG performed in the ED on 1/30 that was documented as afib. This questionably afib. It was reviewed by Suzann Riddle, NP and Dr. Alvenia Aus who felt it could be afib.  Patient was admitted to the ED and early April 2025 with complaints of generalized weakness.  EKG was performed and read as A-fib.  Patient was discharged 2 days later on dose adjusted Eliquis .  Upon arriving home from the hospital she fell backwards hitting her head and returned to the hospital and was again admitted.  Patient and husband decided to stop oral anticoagulation secondary to frequency of falls.  Patient*** -Continue carvedilol  and mexiletine. - Reschedule follow up with Dr. Daneil Dunker***.    Hypertension BP today***. No report of headaches or dizziness.*** -Continue carvedilol .   Hyperlipidemia LDL August 2024 67, at goal. -Continue atorvastatin .  Frequent falls Patient***  ROS: All other systems reviewed and are otherwise negative except as noted in History of Present Illness.  EKGs/Labs Reviewed        01/14/2024: ALT 29; AST 53 01/18/2024: BUN 28; Creatinine, Ser 0.75; Potassium 4.0; Sodium 138   01/18/2024: Hemoglobin 13.8; WBC 3.9   01/11/2024: TSH 5.503   01/11/2024: B Natriuretic Peptide  876.8  ***  Risk Assessment/Calculations    {Does this patient have ATRIAL FIBRILLATION?:[id number]} No BP recorded.  {Refresh Note OR Click here to enter BP  :1}***        Physical Exam    VS:  LMP  (LMP Unknown)  , BMI There is no height or weight on file to calculate BMI.  GEN: Well nourished, well developed, in no acute distress. Neck: No JVD or carotid bruits. Cardiac: *** RRR. No murmurs. No rubs or gallops.   Respiratory:  Respirations regular and unlabored. Clear to auscultation without rales, wheezing or rhonchi. GI: Soft, nontender, nondistended. Extremities: Radials/DP/PT 2+ and equal bilaterally. No clubbing or cyanosis. No edema ***  Skin: Warm and dry, no rash. Neuro: Strength intact.  Assessment & Plan   ***  Disposition: ***     {Are you ordering a CV Procedure (e.g. stress test, cath, DCCV, TEE, etc)?   Press F2        :[id number]}   Signed, Lonell Rives. Charlett Merkle, DNP, NP-C

## 2024-02-02 ENCOUNTER — Ambulatory Visit: Attending: Student | Admitting: Student

## 2024-02-02 DIAGNOSIS — Z961 Presence of intraocular lens: Secondary | ICD-10-CM | POA: Diagnosis not present

## 2024-02-02 DIAGNOSIS — H52203 Unspecified astigmatism, bilateral: Secondary | ICD-10-CM | POA: Diagnosis not present

## 2024-02-03 DIAGNOSIS — R296 Repeated falls: Secondary | ICD-10-CM | POA: Diagnosis not present

## 2024-02-03 DIAGNOSIS — R413 Other amnesia: Secondary | ICD-10-CM | POA: Diagnosis not present

## 2024-02-03 DIAGNOSIS — I25810 Atherosclerosis of coronary artery bypass graft(s) without angina pectoris: Secondary | ICD-10-CM | POA: Diagnosis not present

## 2024-02-03 DIAGNOSIS — E7800 Pure hypercholesterolemia, unspecified: Secondary | ICD-10-CM | POA: Diagnosis not present

## 2024-02-03 DIAGNOSIS — J449 Chronic obstructive pulmonary disease, unspecified: Secondary | ICD-10-CM | POA: Diagnosis not present

## 2024-02-03 DIAGNOSIS — I10 Essential (primary) hypertension: Secondary | ICD-10-CM | POA: Diagnosis not present

## 2024-02-03 DIAGNOSIS — I5032 Chronic diastolic (congestive) heart failure: Secondary | ICD-10-CM | POA: Diagnosis not present

## 2024-02-03 DIAGNOSIS — G9340 Encephalopathy, unspecified: Secondary | ICD-10-CM | POA: Diagnosis not present

## 2024-02-05 DIAGNOSIS — R262 Difficulty in walking, not elsewhere classified: Secondary | ICD-10-CM | POA: Diagnosis not present

## 2024-02-05 DIAGNOSIS — M5030 Other cervical disc degeneration, unspecified cervical region: Secondary | ICD-10-CM | POA: Diagnosis not present

## 2024-02-06 DIAGNOSIS — J449 Chronic obstructive pulmonary disease, unspecified: Secondary | ICD-10-CM | POA: Diagnosis not present

## 2024-02-06 DIAGNOSIS — J9611 Chronic respiratory failure with hypoxia: Secondary | ICD-10-CM | POA: Diagnosis not present

## 2024-02-09 DIAGNOSIS — G9340 Encephalopathy, unspecified: Secondary | ICD-10-CM | POA: Diagnosis not present

## 2024-02-09 DIAGNOSIS — I4891 Unspecified atrial fibrillation: Secondary | ICD-10-CM | POA: Diagnosis not present

## 2024-02-09 DIAGNOSIS — M6281 Muscle weakness (generalized): Secondary | ICD-10-CM | POA: Diagnosis not present

## 2024-02-09 DIAGNOSIS — J449 Chronic obstructive pulmonary disease, unspecified: Secondary | ICD-10-CM | POA: Diagnosis not present

## 2024-02-09 DIAGNOSIS — Z741 Need for assistance with personal care: Secondary | ICD-10-CM | POA: Diagnosis not present

## 2024-02-09 DIAGNOSIS — Z9181 History of falling: Secondary | ICD-10-CM | POA: Diagnosis not present

## 2024-02-09 DIAGNOSIS — G629 Polyneuropathy, unspecified: Secondary | ICD-10-CM | POA: Diagnosis not present

## 2024-02-09 DIAGNOSIS — R4189 Other symptoms and signs involving cognitive functions and awareness: Secondary | ICD-10-CM | POA: Diagnosis not present

## 2024-02-09 DIAGNOSIS — R278 Other lack of coordination: Secondary | ICD-10-CM | POA: Diagnosis not present

## 2024-02-09 DIAGNOSIS — I5032 Chronic diastolic (congestive) heart failure: Secondary | ICD-10-CM | POA: Diagnosis not present

## 2024-02-09 DIAGNOSIS — R2689 Other abnormalities of gait and mobility: Secondary | ICD-10-CM | POA: Diagnosis not present

## 2024-03-01 ENCOUNTER — Encounter (HOSPITAL_COMMUNITY): Payer: Self-pay

## 2024-03-02 ENCOUNTER — Other Ambulatory Visit: Payer: Self-pay | Admitting: Medical

## 2024-03-02 ENCOUNTER — Ambulatory Visit
Admission: RE | Admit: 2024-03-02 | Discharge: 2024-03-02 | Disposition: A | Discharge status: Home / Self Care | Source: Ambulatory Visit | Attending: Medical | Admitting: Medical

## 2024-03-02 DIAGNOSIS — I517 Cardiomegaly: Secondary | ICD-10-CM | POA: Diagnosis not present

## 2024-03-02 DIAGNOSIS — I10 Essential (primary) hypertension: Secondary | ICD-10-CM | POA: Diagnosis not present

## 2024-03-02 MED ORDER — GADOBUTROL 1 MMOL/ML IV SOLN
8.0000 mL | Freq: Once | INTRAVENOUS | Status: AC | PRN
  Administered 2024-03-02: 8 mL via INTRAVENOUS

## 2024-03-03 ENCOUNTER — Ambulatory Visit: Payer: Self-pay | Admitting: Medical

## 2024-03-07 ENCOUNTER — Ambulatory Visit: Payer: Self-pay | Admitting: Emergency Medicine

## 2024-03-08 DIAGNOSIS — R278 Other lack of coordination: Secondary | ICD-10-CM | POA: Diagnosis not present

## 2024-03-08 DIAGNOSIS — Z741 Need for assistance with personal care: Secondary | ICD-10-CM | POA: Diagnosis not present

## 2024-03-08 DIAGNOSIS — J449 Chronic obstructive pulmonary disease, unspecified: Secondary | ICD-10-CM | POA: Diagnosis not present

## 2024-03-08 DIAGNOSIS — I4891 Unspecified atrial fibrillation: Secondary | ICD-10-CM | POA: Diagnosis not present

## 2024-03-08 DIAGNOSIS — I5032 Chronic diastolic (congestive) heart failure: Secondary | ICD-10-CM | POA: Diagnosis not present

## 2024-03-08 DIAGNOSIS — G9340 Encephalopathy, unspecified: Secondary | ICD-10-CM | POA: Diagnosis not present

## 2024-03-08 DIAGNOSIS — R2689 Other abnormalities of gait and mobility: Secondary | ICD-10-CM | POA: Diagnosis not present

## 2024-03-08 DIAGNOSIS — Z9181 History of falling: Secondary | ICD-10-CM | POA: Diagnosis not present

## 2024-03-08 DIAGNOSIS — J9611 Chronic respiratory failure with hypoxia: Secondary | ICD-10-CM | POA: Diagnosis not present

## 2024-03-08 DIAGNOSIS — R4189 Other symptoms and signs involving cognitive functions and awareness: Secondary | ICD-10-CM | POA: Diagnosis not present

## 2024-03-08 DIAGNOSIS — G629 Polyneuropathy, unspecified: Secondary | ICD-10-CM | POA: Diagnosis not present

## 2024-03-08 DIAGNOSIS — M6281 Muscle weakness (generalized): Secondary | ICD-10-CM | POA: Diagnosis not present

## 2024-03-15 ENCOUNTER — Emergency Department: Admission: EM | Admit: 2024-03-15 | Discharge: 2024-03-15 | Disposition: A | Discharge status: Home / Self Care

## 2024-03-15 ENCOUNTER — Emergency Department

## 2024-03-15 DIAGNOSIS — M40204 Unspecified kyphosis, thoracic region: Secondary | ICD-10-CM | POA: Diagnosis not present

## 2024-03-15 DIAGNOSIS — J449 Chronic obstructive pulmonary disease, unspecified: Secondary | ICD-10-CM | POA: Insufficient documentation

## 2024-03-15 DIAGNOSIS — I2510 Atherosclerotic heart disease of native coronary artery without angina pectoris: Secondary | ICD-10-CM | POA: Diagnosis not present

## 2024-03-15 DIAGNOSIS — R7989 Other specified abnormal findings of blood chemistry: Secondary | ICD-10-CM | POA: Insufficient documentation

## 2024-03-15 DIAGNOSIS — R5381 Other malaise: Secondary | ICD-10-CM

## 2024-03-15 DIAGNOSIS — R001 Bradycardia, unspecified: Secondary | ICD-10-CM | POA: Diagnosis not present

## 2024-03-15 DIAGNOSIS — I517 Cardiomegaly: Secondary | ICD-10-CM | POA: Diagnosis not present

## 2024-03-15 DIAGNOSIS — R000 Tachycardia, unspecified: Secondary | ICD-10-CM | POA: Diagnosis not present

## 2024-03-15 DIAGNOSIS — I130 Hypertensive heart and chronic kidney disease with heart failure and stage 1 through stage 4 chronic kidney disease, or unspecified chronic kidney disease: Secondary | ICD-10-CM | POA: Insufficient documentation

## 2024-03-15 DIAGNOSIS — I5030 Unspecified diastolic (congestive) heart failure: Secondary | ICD-10-CM | POA: Diagnosis not present

## 2024-03-15 DIAGNOSIS — I10 Essential (primary) hypertension: Secondary | ICD-10-CM | POA: Diagnosis not present

## 2024-03-15 DIAGNOSIS — D696 Thrombocytopenia, unspecified: Secondary | ICD-10-CM | POA: Diagnosis not present

## 2024-03-15 DIAGNOSIS — I700 Atherosclerosis of aorta: Secondary | ICD-10-CM | POA: Diagnosis not present

## 2024-03-15 DIAGNOSIS — J439 Emphysema, unspecified: Secondary | ICD-10-CM | POA: Diagnosis not present

## 2024-03-15 DIAGNOSIS — R0902 Hypoxemia: Secondary | ICD-10-CM | POA: Diagnosis not present

## 2024-03-15 DIAGNOSIS — R5383 Other fatigue: Secondary | ICD-10-CM | POA: Diagnosis not present

## 2024-03-15 DIAGNOSIS — I491 Atrial premature depolarization: Secondary | ICD-10-CM | POA: Diagnosis not present

## 2024-03-15 DIAGNOSIS — D72819 Decreased white blood cell count, unspecified: Secondary | ICD-10-CM | POA: Diagnosis not present

## 2024-03-15 DIAGNOSIS — N189 Chronic kidney disease, unspecified: Secondary | ICD-10-CM | POA: Insufficient documentation

## 2024-03-15 LAB — CBC
HCT: 45.6 % (ref 36.0–46.0)
Hemoglobin: 15 g/dL (ref 12.0–15.0)
MCH: 33 pg (ref 26.0–34.0)
MCHC: 32.9 g/dL (ref 30.0–36.0)
MCV: 100.4 fL — ABNORMAL HIGH (ref 80.0–100.0)
Platelets: 135 10*3/uL — ABNORMAL LOW (ref 150–400)
RBC: 4.54 MIL/uL (ref 3.87–5.11)
RDW: 13.7 % (ref 11.5–15.5)
WBC: 3.8 10*3/uL — ABNORMAL LOW (ref 4.0–10.5)
nRBC: 0 % (ref 0.0–0.2)

## 2024-03-15 LAB — BASIC METABOLIC PANEL WITH GFR
Anion gap: 9 (ref 5–15)
BUN: 18 mg/dL (ref 8–23)
CO2: 26 mmol/L (ref 22–32)
Calcium: 10.9 mg/dL — ABNORMAL HIGH (ref 8.9–10.3)
Chloride: 104 mmol/L (ref 98–111)
Creatinine, Ser: 0.68 mg/dL (ref 0.44–1.00)
GFR, Estimated: >60 mL/min (ref >60)
Glucose, Bld: 105 mg/dL — ABNORMAL HIGH (ref 70–99)
Potassium: 4.1 mmol/L (ref 3.5–5.1)
Sodium: 139 mmol/L (ref 135–145)

## 2024-03-15 LAB — URINALYSIS, COMPLETE (UACMP) WITH MICROSCOPIC
Bacteria, UA: NONE SEEN
Bilirubin Urine: NEGATIVE
Glucose, UA: NEGATIVE mg/dL
Hgb urine dipstick: NEGATIVE
Ketones, ur: NEGATIVE mg/dL
Leukocytes,Ua: NEGATIVE
Nitrite: NEGATIVE
Protein, ur: 30 mg/dL — AB
Specific Gravity, Urine: 1.008 (ref 1.005–1.030)
pH: 7 (ref 5.0–8.0)

## 2024-03-15 LAB — RESP PANEL BY RT-PCR (RSV, FLU A&B, COVID)  RVPGX2
Influenza A by PCR: NEGATIVE
Influenza B by PCR: NEGATIVE
Resp Syncytial Virus by PCR: NEGATIVE
SARS Coronavirus 2 by RT PCR: NEGATIVE

## 2024-03-15 LAB — TROPONIN I (HIGH SENSITIVITY): Troponin I (High Sensitivity): 26 ng/L — ABNORMAL HIGH (ref <18)

## 2024-03-15 MED ORDER — CARVEDILOL 6.25 MG PO TABS
12.5000 mg | ORAL_TABLET | Freq: Once | ORAL | Status: AC
  Administered 2024-03-15: 12.5 mg via ORAL
  Filled 2024-03-15: qty 2

## 2024-03-15 MED ORDER — HYDRALAZINE HCL 20 MG/ML IJ SOLN
5.0000 mg | Freq: Once | INTRAMUSCULAR | Status: AC
  Administered 2024-03-15: 5 mg via INTRAVENOUS
  Filled 2024-03-15: qty 1

## 2024-03-15 NOTE — ED Triage Notes (Signed)
 First Nurse Note: Patient to ED via [facility] from [facility] independent living. PT c/o not feeling good. BP 207/109- takes BP meds, took approx 1 hr ago.   90 HR 88% RA- placed on 2L [place] now 99%

## 2024-03-15 NOTE — ED Provider Notes (Signed)
 [facility] Provider Note    Event Date/Time   First MD Initiated Contact with Patient 03/15/24 1722     (approximate)   History   Fatigue  First Nurse Note: Patient to ED via [facility] from [facility] independent living. PT c/o not feeling good. BP 207/109- takes BP meds, took approx 1 hr ago.   90 HR 88% RA- placed on 2L [place] now 99%  Refer to first nurse note: Pt to ED via [facility]. Pt reports generally not feeling well. Pt reports that this started last night. Pt does have a hx of emphysema. Denies feeling more short of breath than normal. Pt's O2 saturation 88% on RA. 98% on 2L via [place].   HPI Tiffany Caldwell is a 83 y.o. female PMH COPD, hypertension, CKD, HFpEF, A-fib not on anticoag, chronic thrombocytopenia, CAD presents for evaluation of generalized malaise - Patient has no specific complaints, tells me that she "feels yucky" since yesterday evening though was actually feeling moderately better this afternoon.  Did not check her blood pressure at home and was found to be hypertensive.  Decided she would like to be seen in the emergency department for evaluation. - Denies any focal symptoms including chest pain, abdominal pain, shortness of breath, headache, nausea/vomiting/diarrhea, dizziness, urinary symptoms, focal weakness - Intermittently uses oxygen  at home  Per chart review, last admitted in April of this year for acute encephalopathy after a fall and generalized weakness, thought to be multifactorial in setting of dehydration and possible concussion.     Physical Exam   Triage Vital Signs: ED Triage Vitals [03/15/24 1509]  Encounter Vitals Group     BP (!) 194/111     Systolic BP Percentile      Diastolic BP Percentile      Pulse Rate 67     Resp 18     Temp 98.8 F (37.1 C)     Temp Source Oral     SpO2 99 %     Weight 120 lb (54.4 kg)     Height 5\' 5"  (1.651 m)     Head Circumference      Peak Flow      Pain Score 0     Pain Loc       Pain Education      Exclude from Growth Chart     Most recent vital signs: Vitals:   03/15/24 1930 03/15/24 2000  BP: (!) 155/91 133/70  Pulse: 63 65  Resp: 18   Temp:    SpO2: 95% 94%     General: Awake, no distress.  CV:  Good peripheral perfusion. RRR, RP 2+ Resp:  Normal effort. CTAB Abd:  No distention. Nontender to deep palpation throughout Neuro:  Aox4, CN II-XII intact, FNF wnl, finger taps fast b/l, 5/5 strength in bilateral finger extension/grip, arm flexion/extension, EHL/FHL. BUE AG 10+ sec no drift, BLE AG 5+ sec no drift. SILT.    ED Results / Procedures / Treatments   Labs (all labs ordered are listed, but only abnormal results are displayed) Labs Reviewed  BASIC METABOLIC PANEL WITH GFR - Abnormal; Notable for the following components:      Result Value   Glucose, Bld 105 (*)    Calcium  10.9 (*)    All other components within normal limits  CBC - Abnormal; Notable for the following components:   WBC 3.8 (*)    MCV 100.4 (*)    Platelets 135 (*)    All  other components within normal limits  URINALYSIS, COMPLETE (UACMP) WITH MICROSCOPIC - Abnormal; Notable for the following components:   Color, Urine YELLOW (*)    APPearance CLEAR (*)    Protein, ur 30 (*)    All other components within normal limits  TROPONIN I (HIGH SENSITIVITY) - Abnormal; Notable for the following components:   Troponin I (High Sensitivity) 26 (*)    All other components within normal limits  RESP PANEL BY RT-PCR (RSV, FLU A&B, COVID)  RVPGX2     EKG  Sinus tachycardia versus A-fib, rate 101, bifascicular block present.  No gross ST elevation or depression.  No clear evidence of ischemia on my read.   RADIOLOGY Radiology interpreted by myself and radiology report reviewed.    PROCEDURES:  Critical Care performed: No  Procedures   MEDICATIONS ORDERED IN ED: Medications  carvedilol  (COREG ) tablet 12.5 mg (12.5 mg Oral Given 03/15/24 1908)  hydrALAZINE  (APRESOLINE )  injection 5 mg (5 mg Intravenous Given 03/15/24 1908)     IMPRESSION / MDM / ASSESSMENT AND PLAN / ED COURSE  I reviewed the triage vital signs and the nursing notes.                              DDX/MDM/AP: Differential diagnosis includes, but is not limited to, viral syndrome, anemia, electrolyte abnormality.  Nonfocal neuro exam here, no concern for underlying CVA at this time.  Consider underlying UTI or pneumonia, doubt ACS.  Reassuring eval here, vague symptoms, will plan for broad workup.  Plan: - Labs - EKG - Chest x-ray - Reassess  Patient's presentation is most consistent with acute presentation with potential threat to life or bodily function.  The patient is on the cardiac monitor to evaluate for evidence of arrhythmia and/or significant heart rate changes.  ED course below.  Workup overall reassuring.  No significant laboratory abnormalities, chronically elevated troponin is notably lower than prior.  No obvious ischemic changes on EKG. remained hypertensive here in the emergency department, given small dose of hydralazine  and loaded with her regular carvedilol , notes that her already improved symptoms are even better after blood pressure control.  Appears to have had notable side effects to multiple blood pressure medications in the past, discussed possibly increasing her carvedilol  though heart rate is primarily in the 60s during her stay here versus continuing to monitor blood pressure and following up with her primary care provider.  Patient prefers to defer any medication changes at this time which I believe is very reasonable.  Plan for close PMD follow-up.  ED return precautions in place.  Patient agrees with plan.  No evidence of acute pathology today.  Clinical Course as of 03/15/24 2021  Tue Mar 15, 2024  1733 CBC with stable mild leukopenia, mild thrombocytopenia, no anemia  BMP reviewed, unremarkable  Viral swab negative [MM]  1733 Chest x-ray interpreted by  myself, unremarkable  Radiology report below IMPRESSION: 1. Emphysema.  No pneumonia or pulmonary edema. 2. Cardiomegaly.   [MM]  1849 UA unremarkable [MM]  1850 Troponin mildly elevated, downtrending from prior [MM]  1853 She remains notably hypertensive here and continues to have vague complaints of not feeling well though no focal symptoms.  Is on carvedilol  as outpatient, will give home dose.  Will also give small IV dose of hydralazine . [MM]    Clinical Course User Index [MM] Collis Deaner, MD     FINAL CLINICAL IMPRESSION(S) / ED  DIAGNOSES   Final diagnoses:  Malaise and fatigue  Hypertension, unspecified type     Rx / DC Orders   ED Discharge Orders     None        Note:  This document was prepared using Dragon voice recognition software and may include unintentional dictation errors.   Collis Deaner, MD 03/15/24 2021

## 2024-03-15 NOTE — ED Triage Notes (Signed)
 Refer to first nurse note: Pt to ED via [facility]. Pt reports generally not feeling well. Pt reports that this started last night. Pt does have a hx of emphysema. Denies feeling more short of breath than normal. Pt's O2 saturation 88% on RA. 98% on 2L via [place].

## 2024-03-15 NOTE — Discharge Instructions (Signed)
 Your evaluation in the emergency department was overall reassuring.  I am unsure as to the exact cause of why you felt unwell earlier, but it is possible it may have been due to your elevated blood pressure.  This improved with treatment.  Continue to take your carvedilol  as already prescribed and follow-up with your primary care doctor for reevaluation and further management.  Return to the emergency department with any new or worsening symptoms.

## 2024-03-18 ENCOUNTER — Other Ambulatory Visit: Payer: Self-pay | Admitting: Student

## 2024-03-29 DIAGNOSIS — I10 Essential (primary) hypertension: Secondary | ICD-10-CM | POA: Diagnosis not present

## 2024-03-31 ENCOUNTER — Telehealth: Payer: Self-pay | Admitting: Internal Medicine

## 2024-03-31 ENCOUNTER — Other Ambulatory Visit: Payer: Self-pay

## 2024-03-31 NOTE — Telephone Encounter (Signed)
 These medication changes were made since her last visit in our office in February.  She missed her f/u visits with several providers in our office in April (in part because she was hospitalized).  I recommend that she follow-up with me or an APP at her convenience to reassess her blood pressure and discuss her antihypertensive regimen.  Lonni Hanson, MD [facility]

## 2024-03-31 NOTE — Telephone Encounter (Signed)
 Pt c/o medication issue:  1. Name of Medication:   telmisartan (MICARDIS) 80 MG tablet  1 tablet Orally Once a day for 90 days  carvedilol  (COREG ) 3.125 MG tablet  3.125 mg.  2. How are you currently taking this medication (dosage and times per day)? As written  3. Are you having a reaction (difficulty breathing--STAT)?  No  4. What is your medication issue? Patient's son is calling because there has been some confusion when it comes to the correct dosage amount for the medications listed above. Patient's son is requesting to speak with the nurse for the correct dosage the patient should be taking. Please advise.

## 2024-04-01 NOTE — Telephone Encounter (Signed)
 Left a message for the patient to call back.

## 2024-04-04 DIAGNOSIS — J449 Chronic obstructive pulmonary disease, unspecified: Secondary | ICD-10-CM | POA: Diagnosis not present

## 2024-04-04 DIAGNOSIS — E7800 Pure hypercholesterolemia, unspecified: Secondary | ICD-10-CM | POA: Diagnosis not present

## 2024-04-04 DIAGNOSIS — I10 Essential (primary) hypertension: Secondary | ICD-10-CM | POA: Diagnosis not present

## 2024-04-04 DIAGNOSIS — I25810 Atherosclerosis of coronary artery bypass graft(s) without angina pectoris: Secondary | ICD-10-CM | POA: Diagnosis not present

## 2024-04-04 NOTE — Telephone Encounter (Signed)
 Per chart review, pt has an appointment scheduled for tomorrow 04/05/24.

## 2024-04-05 ENCOUNTER — Encounter: Payer: Self-pay | Admitting: Medical

## 2024-04-05 ENCOUNTER — Ambulatory Visit: Attending: Medical | Admitting: Medical

## 2024-04-05 VITALS — BP 100/40 | HR 58 | Ht 65.0 in | Wt 121.8 lb

## 2024-04-05 DIAGNOSIS — I4891 Unspecified atrial fibrillation: Secondary | ICD-10-CM | POA: Diagnosis not present

## 2024-04-05 DIAGNOSIS — I480 Paroxysmal atrial fibrillation: Secondary | ICD-10-CM

## 2024-04-05 DIAGNOSIS — R278 Other lack of coordination: Secondary | ICD-10-CM | POA: Diagnosis not present

## 2024-04-05 DIAGNOSIS — I25810 Atherosclerosis of coronary artery bypass graft(s) without angina pectoris: Secondary | ICD-10-CM | POA: Diagnosis not present

## 2024-04-05 DIAGNOSIS — I272 Pulmonary hypertension, unspecified: Secondary | ICD-10-CM | POA: Diagnosis not present

## 2024-04-05 DIAGNOSIS — G629 Polyneuropathy, unspecified: Secondary | ICD-10-CM | POA: Diagnosis not present

## 2024-04-05 DIAGNOSIS — R2689 Other abnormalities of gait and mobility: Secondary | ICD-10-CM | POA: Diagnosis not present

## 2024-04-05 DIAGNOSIS — I493 Ventricular premature depolarization: Secondary | ICD-10-CM | POA: Diagnosis not present

## 2024-04-05 DIAGNOSIS — Z9181 History of falling: Secondary | ICD-10-CM | POA: Diagnosis not present

## 2024-04-05 DIAGNOSIS — J449 Chronic obstructive pulmonary disease, unspecified: Secondary | ICD-10-CM | POA: Diagnosis not present

## 2024-04-05 DIAGNOSIS — I5032 Chronic diastolic (congestive) heart failure: Secondary | ICD-10-CM | POA: Diagnosis not present

## 2024-04-05 DIAGNOSIS — I10 Essential (primary) hypertension: Secondary | ICD-10-CM | POA: Diagnosis not present

## 2024-04-05 DIAGNOSIS — R4189 Other symptoms and signs involving cognitive functions and awareness: Secondary | ICD-10-CM | POA: Diagnosis not present

## 2024-04-05 DIAGNOSIS — E782 Mixed hyperlipidemia: Secondary | ICD-10-CM | POA: Diagnosis not present

## 2024-04-05 DIAGNOSIS — G9340 Encephalopathy, unspecified: Secondary | ICD-10-CM | POA: Diagnosis not present

## 2024-04-05 DIAGNOSIS — R7989 Other specified abnormal findings of blood chemistry: Secondary | ICD-10-CM | POA: Diagnosis not present

## 2024-04-05 DIAGNOSIS — M6281 Muscle weakness (generalized): Secondary | ICD-10-CM | POA: Diagnosis not present

## 2024-04-05 DIAGNOSIS — Z741 Need for assistance with personal care: Secondary | ICD-10-CM | POA: Diagnosis not present

## 2024-04-05 MED ORDER — CARVEDILOL 6.25 MG PO TABS
6.2500 mg | ORAL_TABLET | Freq: Two times a day (BID) | ORAL | 3 refills | Status: AC
Start: 2024-04-05 — End: ?

## 2024-04-05 NOTE — Patient Instructions (Signed)
 Medication Instructions:  Your physician recommends the following medication changes.   INCREASE: Carvedilol  o 6.25 mg by mouth twice a day    *If you need a refill on your cardiac medications before your next appointment, please call your pharmacy*  Lab Work: No labs ordered today    Testing/Procedures: No test ordered today   Follow-Up: At [facility], you and your health needs are our priority.  As part of our continuing mission to provide you with exceptional heart care, our providers are all part of one team.  This team includes your primary Cardiologist (physician) and Advanced Practice Providers or APPs (Physician Assistants and Nurse Practitioners) who all work together to provide you with the care you need, when you need it.  Your next appointment:   1 month(s)  Provider:   Mikey Fishman, PA-C

## 2024-04-05 NOTE — Progress Notes (Unsigned)
 Cardiology Office Note   Date:  04/06/2024  ID:  Kynlie, Jane 1941/03/25, MRN [id number] PCP: Alys Schuyler HERO, PA  [facility] Providers Cardiologist:  Lonni Hanson, MD Electrophysiologist:  Fonda Kitty, MD   History of Present Illness Tiffany Caldwell is a 83 y.o. female with a hx of CAD status post remote PCI with subsequent emergent three-vessel CABG for iatrogenic left main/LAD dissection in 2001, right lower lobe cavitary lung mass, labile hypertension, hyperlipidemia, HFpEF, left-sided breast cancer, COPD, IBS, GERD who presents for follow-up.   In 2001 patient underwent CABG x 3 with LIMA to LAD, SVG to diagonal, and SVG to OM.  Echo from 2016 showed EF of 55 to 60%, grade 1 diastolic dysfunction.  Lexiscan  in 2020 showed no significant ischemia and was overall low risk.  Echo in 2020 showed EF of 65 to 70%, grade 1 diastolic dysfunction, mild AI. More recently, amlodipine  stopped due to lower leg edema.  Patient was started on Lasix .     Echo in August 2024 showed EF of 55 to 60%, severe asymmetric LVH without LVOT obstruction, grade 2 diastolic dysfunction.  Left heart cath 06/2023 showed severe single-vessel CAD with CTO of the proximal to mid LAD stent, with mid distal LAD supplied by patent LIMA graft.  There was also 60 to 70% stenosis at the ostium of the OM1, 50% mid left circumflex disease, 30% proximal RCA stenosis.  Widely patent LIMA to LAD, chronically occluded SVG to D1 and SVG to OM 2.  Normal left and right heart filling pressures.  Mild to moderate pulmonary hypertension.  It was felt chronic lung disease was mostly driving patient's symptoms.  if symptoms persisted, recommended cardiac MRI  to evaluate severe asymmetric LVH. cMRI showed normal LV size and function, mild LV wall thickness, no LGE or scar, normal RVSF, no significant valvular abnormality and no inflammatory or infiltrative disease.   Patient was seen in October 2024 reporting  persistent breathing issues, felt to be from the lungs.  Heart monitor was ordered to evaluate PVC burden. cMRI has not been completed. Heart monitor showed 22.7% PVC burden and she was referred to EP who stated her on mexiletine and changes coreg  to metoprolol .   She had an ER visit for elevated BP. She felt very fatigued at that time. BP 178/106 on arrival. HS troponin mildly elevated to 40. No anginal symptoms were reported. Patient was given IVF and discharged home.  She was admitted 11/05/23 with acute hypoxic respiratory failure secondary to CHF/COPD.  At follow-up 11/2023 it was noted EKG from the hospital showed Afib and she was started on Eliquis .   Since that time she has been to the hospital many times. She was admitted 01/2024 for a mechanical fall. Imaging was unremarkable. Eliquis  was stopped due to fall risk. HS Troponin was elevated to 101, no anginal symptoms were reported.  Today, she reports labile blood pressures. Today, BP was very low, but earlier this month BP was high. She has been taking coreg  12.5mg  daily. Said PCP Wanted her to take telmisartan, but she is not taking that. The patient does not feel dizzy, lightheaded, tired or fatigue. She denies chest pain or SOB. She has not fallen since the last hospitalization.she just started taking ASA 81mg  daily. She is taking lasix  M,W,F.   Studies Reviewed EKG Interpretation Date/Time:  Tuesday April 05 2024 14:40:46 EDT Ventricular Rate:  58 PR Interval:  214 QRS Duration:  138 QT Interval:  480 QTC  Calculation: 471 R Axis:   -39  Text Interpretation: Sinus bradycardia with 1st degree A-V block Possible Left atrial enlargement Left axis deviation Right bundle branch block Left ventricular hypertrophy ( R in aVL , Romhilt-Estes ) Cannot rule out Septal infarct (cited on or before 15-Mar-2024) T wave abnormality, consider inferolateral ischemia When compared with ECG of 15-Mar-2024 15:26, Significant changes have occurred  Confirmed by Franchester, Zarra Geffert ([id number]) on 04/05/2024 3:02:15 PM    cMRI 02/2024  IMPRESSION: 1.  Normal LV size, normal LV systolic function.  LVEF 67%.   2.  Mild LV wall thickness.   3.  No LGE or scar.   4.  Normal RV systolic function   5.  No significant valvular abnormalities   6.  No inflammatory or infiltrative disease.      Heart monitor 08/2023    The patient was monitored for 13 days, 23 hours.   The predominant rhythm was sinus with an average rate of 62 bpm (range 42-86 bpm in sinus).   There were occasional PACs (4.2% burden) and frequent PVCs (22.7% burden).   3 episodes of nonsustained ventricular tachycardia occurred, lasting up to 9 beats with a maximum rate of 176 bpm.   There were 41 supraventricular runs, lasting up to 16 beats with a maximum rate of 164 bpm.   No sustained arrhythmia or prolonged pause was observed.   There were no patient triggered events.   Predominantly sinus rhythm with frequent PVCs and occasional PACs.  Three episodes of NSVT and multiple supraventricular runs were observed, as detailed above.   R/L heart cath 06/2023 Conclusions: Severe single-vessel coronary artery disease with chronic total occlusion of proximal/mid LAD stent, with mid/distal LAD supplied by patent LIMA graft.  There is also 60-70% stenosis at the ostium of OM1, 50% mid LCx disease, and 30% proximal RCA stenosis. Widely patent LIMA-LAD. Chronically occluded SVG-D1 and SVG-OM1. Normal left and right heart filling pressures (LVEDP 14 mmHg, mean RA 3 mmHg, RVEDP 6 mmHg). Mild to moderate pulmonary hypertension (PA 50/14, mean 26 mmHg). Normal Fick cardiac output/index (CO 5.3 L/min, CI 3.2 L/min/m).   Recommendations: Continue follow-up with pulmonology for management of chronic lung disease that is most likely driving Tiffany Caldwell's chronic exertional dyspnea and mild to moderate pulmonary hypertension.  If symptoms persist, 1 could consider cardiac MRI to better evaluate  severe asymmetric left ventricular hypertrophy as well as consultation with the advanced heart failure service. Aggressive secondary prevention of coronary artery disease.  Favor medical management of 60-70% ostial OM1 and 50% mid LCx disease.   Lonni Hanson, MD [facility]   Echo 08/2022  1. No LVOT obstruction.. Left ventricular ejection fraction, by  estimation, is 55 to 60%. The left ventricle has normal function. The left  ventricle has no regional wall motion abnormalities. There is severe  asymmetric left ventricular hypertrophy of  the basal-septal segment. Left ventricular diastolic parameters are  consistent with Grade II diastolic dysfunction (pseudonormalization).   2. Right ventricular systolic function is low normal. The right  ventricular size is normal. There is moderately elevated pulmonary artery  systolic pressure.   3. Left atrial size was severely dilated.   4. Right atrial size was mild to moderately dilated.   5. The mitral valve is normal in structure. Mild to moderate mitral valve  regurgitation.   6. The aortic valve is tricuspid. Aortic valve regurgitation is mild.  Aortic valve sclerosis is present, with no evidence of aortic valve  stenosis.  7. The inferior vena cava is dilated in size with >50% respiratory  variability, suggesting right atrial pressure of 8 mmHg.       Physical Exam VS:  BP (!) 100/40 (BP Location: Left Arm, Patient Position: Sitting, Cuff Size: Normal)   Pulse (!) 58   Ht 5' 5 (1.651 m)   Wt 121 lb 12.8 oz (55.2 kg)   LMP  (LMP Unknown)   SpO2 91%   BMI 20.27 kg/m        Wt Readings from Last 3 Encounters:  04/05/24 121 lb 12.8 oz (55.2 kg)  03/15/24 120 lb (54.4 kg)  01/26/24 116 lb 3.2 oz (52.7 kg)    GEN: Well nourished, well developed in no acute distress NECK: No JVD; No carotid bruits CARDIAC: RRR, no murmurs, rubs, gallops RESPIRATORY:  Clear to auscultation without rales, wheezing or rhonchi  ABDOMEN:  Soft, non-tender, non-distended EXTREMITIES:  1+ lower leg edema; No deformity   ASSESSMENT AND PLAN  HTN BP today is soft 100/40. In the ER earlier this month blood pressure was very high. She is taking Coreg  12.5mg  daily. She was given telmisartan, but has not started this. I recommended she check her BP at home once daily. I recommend Coreg  6.25mg  BID. We will see her back in 1 month  Paroxsymal Afib Eliquis  was stopped 01/2024 due to fall risk. She is in NSR. She is taking ASA 81mg  daily. Will let MD know.  Elevated troponin CAD s/p CABG x 3 Troponin elevated to 101 in the hospital 01/2024, no chest pain at that time. Suspect supply demand ischemia. She denies any chest pain. She has known CAD. Discussed MPI, but patient would like to defer at this time, which is reasonable given lack of symptoms, as well as management would not likely change. LHC in 06/2023 showed no PCI targets.Continue ASA 81mg  daily, Coreg  and Lipitor.   Frequent PVCs Continue Mexiletine per EP.   HLD LDL 67. Continue Lipitor.         Dispo: Follow-up in 1 month  Signed, Gareth Fitzner VEAR Fishman, PA-C

## 2024-04-07 DIAGNOSIS — J449 Chronic obstructive pulmonary disease, unspecified: Secondary | ICD-10-CM | POA: Diagnosis not present

## 2024-04-07 DIAGNOSIS — J9611 Chronic respiratory failure with hypoxia: Secondary | ICD-10-CM | POA: Diagnosis not present

## 2024-04-21 ENCOUNTER — Telehealth: Payer: Self-pay | Admitting: Medical

## 2024-04-21 NOTE — Telephone Encounter (Signed)
 Call to pt and husband: reports BPs for the last 4 days (16-13 July): 178/84 167/81 191/87 177/84 -- Husband doesn't know if these are am or pm  Today at 1045: BP 160/85 and HR 66 - pt reports she has not taken her BP meds yet.  Pt reports feeling ok but just blah but denies HA, fatigue, dizziness or blurry vision.   Pt advised to take this AM dose of carvedilol  and stand by for further instructions  ED and 911 precautions given  Pt and husband need to keep a log with twice daily measurements

## 2024-04-21 NOTE — Telephone Encounter (Signed)
 Pt c/o medication issue:  1. Name of Medication: Carvedilol  6,25 mg  2. How are you currently taking this medication (dosage and times per day)?   3. Are you having a reaction (difficulty  breathing--STAT)?    4. What is your medication issue? Husband says patient's blood pressure is higher since her dose was reduced      Pt c/o BP issue:  1. What are your last 5 BP readings? 178/84 167/81 191/87 177/84  2. Are you having any other symptoms (ex. Dizziness, headache, blurred vision, passed out)?  No   3. What is your medication issue? Blood pressure is running high

## 2024-04-22 MED ORDER — TELMISARTAN 40 MG PO TABS: 40.0000 mg | ORAL_TABLET | Freq: Every day | ORAL | 3 refills | Status: DC

## 2024-04-22 NOTE — Telephone Encounter (Signed)
 And yet again another reason why I should not be DOD at [facility]. When I leave the clinic and go to the Cath Lab the cath, my focus is no longer on clinic as I am doing procedures.  Alm Clay, MD

## 2024-04-22 NOTE — Telephone Encounter (Signed)
 I recommend that we initiate telmisartan 40 mg daily with follow-up in the office with me or an APP next week to reassess her blood pressure.  Lonni Hanson, MD [facility]

## 2024-04-22 NOTE — Telephone Encounter (Signed)
 Called patient to inform her of Dr. Ulysses recommendation to start Telmisartan 40 mg. The patient asked whether she should continue taking Carvedilol  in addition to Telmisartan. The nurse informed the patient that Dr. Mady did not recommend any other changes aside from starting Telmisartan. The patient stated she would not begin the medication until she receives a definitive answer from Dr. Mady.  The patient also reported bilateral leg swelling, which she noted is weeping. She denied shortness of breath or any other symptoms. The nurse informed the patient that a message would be forwarded to Dr. Mady for review.  The nurse offered the patient an appointment for next week on 05/28/24 at 8:25 am,  however, the patient declined, stating it was too early. She was scheduled for the next available appointment on 06/02/24.

## 2024-04-22 NOTE — Telephone Encounter (Signed)
 Husband is calling to check the status of this message. He said he would like to hear back from someone today since it is Friday. He is concerned about going into the weekend with her BP being elevated

## 2024-04-22 NOTE — Telephone Encounter (Signed)
 Per Dr. Mady, the patient is to continue Carvedilol  in addition to starting Telmisartan 40 mg. The patient was informed and verbalized understanding.  The nurse was also able to schedule the patient to be seen in the office on Monday, 04/25/24, at 2:00 PM.

## 2024-04-25 ENCOUNTER — Ambulatory Visit: Attending: Medical | Admitting: Medical

## 2024-04-25 ENCOUNTER — Encounter: Payer: Self-pay | Admitting: Medical

## 2024-04-25 VITALS — BP 135/72 | HR 61 | Ht 65.5 in | Wt 119.4 lb

## 2024-04-25 DIAGNOSIS — I10 Essential (primary) hypertension: Secondary | ICD-10-CM | POA: Diagnosis not present

## 2024-04-25 DIAGNOSIS — M7989 Other specified soft tissue disorders: Secondary | ICD-10-CM

## 2024-04-25 DIAGNOSIS — I25810 Atherosclerosis of coronary artery bypass graft(s) without angina pectoris: Secondary | ICD-10-CM | POA: Diagnosis not present

## 2024-04-25 DIAGNOSIS — I480 Paroxysmal atrial fibrillation: Secondary | ICD-10-CM | POA: Diagnosis not present

## 2024-04-25 DIAGNOSIS — I5032 Chronic diastolic (congestive) heart failure: Secondary | ICD-10-CM | POA: Diagnosis not present

## 2024-04-25 DIAGNOSIS — I493 Ventricular premature depolarization: Secondary | ICD-10-CM | POA: Diagnosis not present

## 2024-04-25 DIAGNOSIS — Z79899 Other long term (current) drug therapy: Secondary | ICD-10-CM | POA: Diagnosis not present

## 2024-04-25 MED ORDER — FUROSEMIDE 20 MG PO TABS: ORAL_TABLET | ORAL | 3 refills | Status: DC

## 2024-04-25 NOTE — Progress Notes (Unsigned)
 Cardiology Office Note   Date:  04/26/2024  ID:  Nicolet, Griffy Dec 16, 1940, MRN [id number] PCP: Alys Schuyler HERO, PA  [facility] Providers Cardiologist:  Lonni Hanson, MD Electrophysiologist:  Fonda Kitty, MD   History of Present Illness SHERLYNN TOURVILLE is a 83 y.o. female with a hx of CAD status post remote PCI with subsequent emergent three-vessel CABG for iatrogenic left main/LAD dissection in 2001, right lower lobe cavitary lung mass, labile hypertension, hyperlipidemia, HFpEF, left-sided breast cancer, COPD, IBS, GERD who presents for follow-up for BP.   In 2001 patient underwent CABG x 3 with LIMA to LAD, SVG to diagonal, and SVG to OM.  Echo from 2016 showed EF of 55 to 60%, grade 1 diastolic dysfunction.  Lexiscan  in 2020 showed no significant ischemia and was overall low risk.  Echo in 2020 showed EF of 65 to 70%, grade 1 diastolic dysfunction, mild AI. More recently, amlodipine  stopped due to lower leg edema.  Patient was started on Lasix .     Echo in August 2024 showed EF of 55 to 60%, severe asymmetric LVH without LVOT obstruction, grade 2 diastolic dysfunction.  Left heart cath 06/2023 showed severe single-vessel CAD with CTO of the proximal to mid LAD stent, with mid distal LAD supplied by patent LIMA graft.  There was also 60 to 70% stenosis at the ostium of the OM1, 50% mid left circumflex disease, 30% proximal RCA stenosis.  Widely patent LIMA to LAD, chronically occluded SVG to D1 and SVG to OM 2.  Normal left and right heart filling pressures.  Mild to moderate pulmonary hypertension.  It was felt chronic lung disease was mostly driving patient's symptoms.  if symptoms persisted, recommended cardiac MRI  to evaluate severe asymmetric LVH. cMRI showed normal LV size and function, mild LV wall thickness, no LGE or scar, normal RVSF, no significant valvular abnormality and no inflammatory or infiltrative disease.   Patient was seen in October 2024 reporting  persistent breathing issues, felt to be from the lungs.  Heart monitor was ordered to evaluate PVC burden. cMRI has not been completed. Heart monitor showed 22.7% PVC burden and she was referred to EP who stated her on mexiletine.  At hospital follow-up on 11/2023 it was noted EKG from the hospital showed Afib and she was started on Eliquis . Since that time she has been to the hospital many times. She was admitted 01/2024 for a mechanical fall. Imaging was unremarkable. Eliquis  was stopped due to fall risk. HS Troponin was elevated to 101, no anginal symptoms were reported.   She was seen in the office 04/05/24 reporting labile pressures. BP was soft 100/40 and Coreg  was decreased.   She called in last week noting elevated blood pressures and it was recommended she start Telmisartan  40mg  daily.   Today, the patient reports she has been taking coreg  6.25mg BID. Bps 130s-150s/60-70s, but she reports all blood pressure readings are BEFORE taking Coreg . When she called in with a high BP reading, she is not sure if it was before ir after her daily medications. She did not start Telmisartan . The patient denies chest pain or SOB. She has worsening lower leg edema bilaterally. She takes lasix  3 times weekly.   Studies Reviewed      cMRI 02/2024  IMPRESSION: 1.  Normal LV size, normal LV systolic function.  LVEF 67%.   2.  Mild LV wall thickness.   3.  No LGE or scar.   4.  Normal RV systolic  function   5.  No significant valvular abnormalities   6.  No inflammatory or infiltrative disease.       Heart monitor 08/2023    The patient was monitored for 13 days, 23 hours.   The predominant rhythm was sinus with an average rate of 62 bpm (range 42-86 bpm in sinus).   There were occasional PACs (4.2% burden) and frequent PVCs (22.7% burden).   3 episodes of nonsustained ventricular tachycardia occurred, lasting up to 9 beats with a maximum rate of 176 bpm.   There were 41 supraventricular runs,  lasting up to 16 beats with a maximum rate of 164 bpm.   No sustained arrhythmia or prolonged pause was observed.   There were no patient triggered events.   Predominantly sinus rhythm with frequent PVCs and occasional PACs.  Three episodes of NSVT and multiple supraventricular runs were observed, as detailed above.   R/L heart cath 06/2023 Conclusions: Severe single-vessel coronary artery disease with chronic total occlusion of proximal/mid LAD stent, with mid/distal LAD supplied by patent LIMA graft.  There is also 60-70% stenosis at the ostium of OM1, 50% mid LCx disease, and 30% proximal RCA stenosis. Widely patent LIMA-LAD. Chronically occluded SVG-D1 and SVG-OM1. Normal left and right heart filling pressures (LVEDP 14 mmHg, mean RA 3 mmHg, RVEDP 6 mmHg). Mild to moderate pulmonary hypertension (PA 50/14, mean 26 mmHg). Normal Fick cardiac output/index (CO 5.3 L/min, CI 3.2 L/min/m).   Recommendations: Continue follow-up with pulmonology for management of chronic lung disease that is most likely driving Ms. Dicson's chronic exertional dyspnea and mild to moderate pulmonary hypertension.  If symptoms persist, 1 could consider cardiac MRI to better evaluate severe asymmetric left ventricular hypertrophy as well as consultation with the advanced heart failure service. Aggressive secondary prevention of coronary artery disease.  Favor medical management of 60-70% ostial OM1 and 50% mid LCx disease.   Lonni Hanson, MD [facility]    Physical Exam VS:  BP 135/72   Pulse 61   Ht 5' 5.5 (1.664 m)   Wt 119 lb 6.4 oz (54.2 kg)   LMP  (LMP Unknown)   SpO2 91%   BMI 19.57 kg/m        Wt Readings from Last 3 Encounters:  04/25/24 119 lb 6.4 oz (54.2 kg)  04/05/24 121 lb 12.8 oz (55.2 kg)  03/15/24 120 lb (54.4 kg)    GEN: Well nourished, well developed in no acute distress NECK: No JVD; No carotid bruits CARDIAC: RRR, no murmurs, rubs, gallops RESPIRATORY:  Clear to  auscultation without rales, wheezing or rhonchi  ABDOMEN: Soft, non-tender, non-distended EXTREMITIES:  1+ lower leg edema; No deformity   ASSESSMENT AND PLAN  HTN Coreg  decreased at the last visit for borderline soft pressures. She called in last week with elevated pressure and was recommended she take Telmisartan , but has not started this. She is  unsure if this BP was before or after taking her Coreg . She brought in pressures from home, but they are all before AM medications. BP today is 135/72. She did take her Coreg  this AM. I recommend she check her BP 2 hours after her AM medication. Continue Coreg  6.25mg BID.   Lower leg edema HFpEF Worsening lower leg edema. She takes lasix  20mg  M,W,F. I recommend she take lasix  20mg  5 times weekly (Monday through Friday). BMET in 2 weeks. Echo last year showed LVEF 55-60%, severe LVH, low normal RVSF mild to mod MR   Paroxysmal Afib Eliquis  stopped 01/2024  due to fall risk. Continue ASA 81mg  daily.   CAD s/p CABG x 3 No anginal symptoms reported. Patient has known CAD with LHC 06/2023 with no PCI targets. Continue ASA, Coreg  and Lipitor.   PVCs Continue Mexiletine per EP.         Dispo: follow-up in 1 months  Signed, Quintavious Rinck VEAR Fishman, PA-C

## 2024-04-25 NOTE — Patient Instructions (Addendum)
 Medication Instructions:  Your physician recommends the following medication changes.  STOP TAKING: Telmisartan    INCREASE: Lasix  to 20 mg by mouth every Monday, Tuesday, Wednesday, Thursday, and Friday   Please check your blood pressure daily, 2 hours after taking your blood pressure medication.  *If you need a refill on your cardiac medications before your next appointment, please call your pharmacy*  Lab Work: Your provider would like for you to return in 2 week to have the following labs drawn: BMP.   Please go to [facility] [address] ([facility] [address], [place] [address] You do not need an appointment.  They are open from 8 am- 4:30 pm.  Lunch from 1:00 pm- 2:00 pm You will not need to be fasting.    Testing/Procedures: No test ordered today   Follow-Up: At [facility], you and your health needs are our priority.  As part of our continuing mission to provide you with exceptional heart care, our providers are all part of one team.  This team includes your primary Cardiologist (physician) and Advanced Practice Providers or APPs (Physician Assistants and Nurse Practitioners) who all work together to provide you with the care you need, when you need it.  Your next appointment:   1 month(s)  Provider:   Lonni Hanson, MD or Cadence Franchester, PA-C

## 2024-05-02 ENCOUNTER — Ambulatory Visit: Admitting: Physician Assistant

## 2024-05-02 DIAGNOSIS — Z79899 Other long term (current) drug therapy: Secondary | ICD-10-CM | POA: Diagnosis not present

## 2024-05-03 ENCOUNTER — Ambulatory Visit: Payer: Self-pay | Admitting: Medical

## 2024-05-03 LAB — BASIC METABOLIC PANEL WITH GFR
BUN/Creatinine Ratio: 30 — ABNORMAL HIGH (ref 12–28)
BUN: 26 mg/dL (ref 8–27)
CO2: 26 mmol/L (ref 20–29)
Calcium: 10.2 mg/dL (ref 8.7–10.3)
Chloride: 103 mmol/L (ref 96–106)
Creatinine, Ser: 0.87 mg/dL (ref 0.57–1.00)
Glucose: 118 mg/dL — ABNORMAL HIGH (ref 70–99)
Potassium: 4.2 mmol/L (ref 3.5–5.2)
Sodium: 143 mmol/L (ref 134–144)
eGFR: 66 mL/min/1.73 (ref >59)

## 2024-05-05 DIAGNOSIS — E7800 Pure hypercholesterolemia, unspecified: Secondary | ICD-10-CM | POA: Diagnosis not present

## 2024-05-05 DIAGNOSIS — J449 Chronic obstructive pulmonary disease, unspecified: Secondary | ICD-10-CM | POA: Diagnosis not present

## 2024-05-05 DIAGNOSIS — I10 Essential (primary) hypertension: Secondary | ICD-10-CM | POA: Diagnosis not present

## 2024-05-05 DIAGNOSIS — I25810 Atherosclerosis of coronary artery bypass graft(s) without angina pectoris: Secondary | ICD-10-CM | POA: Diagnosis not present

## 2024-05-08 DIAGNOSIS — J449 Chronic obstructive pulmonary disease, unspecified: Secondary | ICD-10-CM | POA: Diagnosis not present

## 2024-05-08 DIAGNOSIS — J9611 Chronic respiratory failure with hypoxia: Secondary | ICD-10-CM | POA: Diagnosis not present

## 2024-05-09 ENCOUNTER — Ambulatory Visit: Admitting: Medical

## 2024-05-10 DIAGNOSIS — I5032 Chronic diastolic (congestive) heart failure: Secondary | ICD-10-CM | POA: Diagnosis not present

## 2024-05-10 DIAGNOSIS — R2689 Other abnormalities of gait and mobility: Secondary | ICD-10-CM | POA: Diagnosis not present

## 2024-05-10 DIAGNOSIS — R4189 Other symptoms and signs involving cognitive functions and awareness: Secondary | ICD-10-CM | POA: Diagnosis not present

## 2024-05-10 DIAGNOSIS — G9340 Encephalopathy, unspecified: Secondary | ICD-10-CM | POA: Diagnosis not present

## 2024-05-10 DIAGNOSIS — Z741 Need for assistance with personal care: Secondary | ICD-10-CM | POA: Diagnosis not present

## 2024-05-10 DIAGNOSIS — J449 Chronic obstructive pulmonary disease, unspecified: Secondary | ICD-10-CM | POA: Diagnosis not present

## 2024-05-10 DIAGNOSIS — G629 Polyneuropathy, unspecified: Secondary | ICD-10-CM | POA: Diagnosis not present

## 2024-05-10 DIAGNOSIS — R278 Other lack of coordination: Secondary | ICD-10-CM | POA: Diagnosis not present

## 2024-05-10 DIAGNOSIS — M6281 Muscle weakness (generalized): Secondary | ICD-10-CM | POA: Diagnosis not present

## 2024-05-10 DIAGNOSIS — Z9181 History of falling: Secondary | ICD-10-CM | POA: Diagnosis not present

## 2024-05-10 DIAGNOSIS — I4891 Unspecified atrial fibrillation: Secondary | ICD-10-CM | POA: Diagnosis not present

## 2024-05-17 ENCOUNTER — Encounter: Payer: Self-pay | Admitting: *Deleted

## 2024-05-18 ENCOUNTER — Encounter: Payer: Self-pay | Admitting: Student

## 2024-05-18 ENCOUNTER — Ambulatory Visit: Admitting: Student

## 2024-05-18 VITALS — BP 122/64 | HR 61 | Temp 97.2°F | Ht 65.0 in | Wt 116.2 lb

## 2024-05-18 DIAGNOSIS — M40204 Unspecified kyphosis, thoracic region: Secondary | ICD-10-CM | POA: Insufficient documentation

## 2024-05-18 DIAGNOSIS — I480 Paroxysmal atrial fibrillation: Secondary | ICD-10-CM | POA: Diagnosis not present

## 2024-05-18 DIAGNOSIS — I5032 Chronic diastolic (congestive) heart failure: Secondary | ICD-10-CM

## 2024-05-18 DIAGNOSIS — M4004 Postural kyphosis, thoracic region: Secondary | ICD-10-CM

## 2024-05-18 DIAGNOSIS — K588 Other irritable bowel syndrome: Secondary | ICD-10-CM

## 2024-05-18 DIAGNOSIS — I700 Atherosclerosis of aorta: Secondary | ICD-10-CM | POA: Insufficient documentation

## 2024-05-18 DIAGNOSIS — M19039 Primary osteoarthritis, unspecified wrist: Secondary | ICD-10-CM | POA: Insufficient documentation

## 2024-05-18 DIAGNOSIS — I129 Hypertensive chronic kidney disease with stage 1 through stage 4 chronic kidney disease, or unspecified chronic kidney disease: Secondary | ICD-10-CM | POA: Insufficient documentation

## 2024-05-18 DIAGNOSIS — D696 Thrombocytopenia, unspecified: Secondary | ICD-10-CM

## 2024-05-18 DIAGNOSIS — M5137 Other intervertebral disc degeneration, lumbosacral region with discogenic back pain only: Secondary | ICD-10-CM

## 2024-05-18 DIAGNOSIS — I493 Ventricular premature depolarization: Secondary | ICD-10-CM

## 2024-05-18 DIAGNOSIS — I25119 Atherosclerotic heart disease of native coronary artery with unspecified angina pectoris: Secondary | ICD-10-CM | POA: Diagnosis not present

## 2024-05-18 DIAGNOSIS — M8580 Other specified disorders of bone density and structure, unspecified site: Secondary | ICD-10-CM | POA: Insufficient documentation

## 2024-05-18 DIAGNOSIS — R0989 Other specified symptoms and signs involving the circulatory and respiratory systems: Secondary | ICD-10-CM

## 2024-05-18 DIAGNOSIS — E210 Primary hyperparathyroidism: Secondary | ICD-10-CM | POA: Diagnosis not present

## 2024-05-18 DIAGNOSIS — A310 Pulmonary mycobacterial infection: Secondary | ICD-10-CM | POA: Insufficient documentation

## 2024-05-18 DIAGNOSIS — R739 Hyperglycemia, unspecified: Secondary | ICD-10-CM

## 2024-05-18 DIAGNOSIS — I491 Atrial premature depolarization: Secondary | ICD-10-CM | POA: Diagnosis not present

## 2024-05-18 DIAGNOSIS — J449 Chronic obstructive pulmonary disease, unspecified: Secondary | ICD-10-CM

## 2024-05-18 DIAGNOSIS — H9191 Unspecified hearing loss, right ear: Secondary | ICD-10-CM | POA: Insufficient documentation

## 2024-05-18 DIAGNOSIS — I272 Pulmonary hypertension, unspecified: Secondary | ICD-10-CM | POA: Diagnosis not present

## 2024-05-18 DIAGNOSIS — D126 Benign neoplasm of colon, unspecified: Secondary | ICD-10-CM | POA: Insufficient documentation

## 2024-05-18 DIAGNOSIS — T829XXA Unspecified complication of cardiac and vascular prosthetic device, implant and graft, initial encounter: Secondary | ICD-10-CM | POA: Diagnosis not present

## 2024-05-18 DIAGNOSIS — R918 Other nonspecific abnormal finding of lung field: Secondary | ICD-10-CM

## 2024-05-18 DIAGNOSIS — J302 Other seasonal allergic rhinitis: Secondary | ICD-10-CM | POA: Insufficient documentation

## 2024-05-18 DIAGNOSIS — E7800 Pure hypercholesterolemia, unspecified: Secondary | ICD-10-CM | POA: Insufficient documentation

## 2024-05-18 MED ORDER — GABAPENTIN 300 MG PO CAPS: 300.0000 mg | ORAL_CAPSULE | Freq: Two times a day (BID) | ORAL | Status: DC

## 2024-05-18 NOTE — Patient Instructions (Signed)
 VISIT SUMMARY:  Today, we reviewed your blood pressure management and overall health. We discussed your current medications, your heart condition, and other ongoing health issues. We also talked about your recent echocardiogram results and your daily weight monitoring routine.  YOUR PLAN:  -HEART FAILURE WITH PRESERVED EJECTION FRACTION WITH SEVERE LEFT VENTRICULAR HYPERTROPHY, MODERATE MITRAL VALVE REGURGITATION, AND CHRONIC LOWER EXTREMITY EDEMA: Heart failure with preserved ejection fraction means your heart pumps normally but is stiff and doesn't fill properly. You should continue taking Lasix  five times a week and monitor your weight daily.  -LABILE HYPERTENSION: Labile hypertension means your blood pressure fluctuates significantly. You should continue taking carvedilol  6.25 mg twice daily.  -ATRIAL FIBRILLATION AND PREMATURE ATRIAL AND VENTRICULAR CONTRACTIONS: Atrial fibrillation is an irregular and often rapid heart rate. Premature contractions are extra heartbeats that disrupt your regular heart rhythm. You should continue taking aspirin  and mexiletine as prescribed.  -CHRONIC OBSTRUCTIVE PULMONARY DISEASE (COPD): COPD is a chronic lung disease that makes it hard to breathe. You should continue using Anoro Ellipta  daily and albuterol  as needed.  -OSTEOPENIA: Osteopenia means you have lower than normal bone density, which can lead to fractures. A bone density scan has been ordered for you.  -IRRITABLE BOWEL SYNDROME WITH CHRONIC ABDOMINAL PAIN: Irritable bowel syndrome is a disorder that affects your large intestine, causing pain and discomfort. You should continue taking hyoscyamine  as needed and monitor for any side effects.  -GASTROESOPHAGEAL REFLUX DISEASE (GERD): GERD is a digestive disorder where stomach acid irritates the food pipe lining. You should continue taking Pepcid as needed.  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 You should continue taking gabapentin  twice daily.  -HISTORY OF LEFT BREAST CANCER, STATUS POST MASTECTOMY WITH CHRONIC LEFT ARM LYMPHEDEMA: Lymphedema is swelling that generally occurs in one of your arms or legs. You should avoid blood draws from your left arm.  -HISTORY OF MYCOBACTERIUM AVIUM COMPLEX (MAC) INFECTION, UNDER SURVEILLANCE: MAC infection is a type of bacterial infection. You are currently under surveillance and not on chronic antibiotics to avoid the risk of thrush.  -HYPERLIPIDEMIA: Hyperlipidemia means you have high levels of fats in your blood. You should continue taking atorvastatin .  INSTRUCTIONS:  Please follow up with a bone density scan as ordered. Continue monitoring your weight daily and take your medications as prescribed. If you experience any new symptoms or side effects, please contact our office.

## 2024-05-18 NOTE — Progress Notes (Signed)
 [facility]: [facility] Provider: ABDUL  Code Status: Full Code Goals of Care:     05/18/2024    1:47 PM  Advanced Directives  Does Patient Have a Medical Advance Directive? Yes  Type of Estate agent of [place];Living will;Out of facility DNR (pink MOST or yellow form)  Does patient want to make changes to medical advance directive? No - Patient declined  Copy of Healthcare Power of Attorney in Chart? No - copy requested     Chief Complaint  Patient presents with   Establish Care    NP to Establish    HPI: Patient is a 83 y.o. female seen today for medical management of chronic diseases.   Discussed the use of AI scribe software for clinical note transcription with the patient, who gave verbal consent to proceed.  History of Present Illness   Tiffany Caldwell is an 83 year old female with significant cardiac disease who presents for follow-up of her cardiovascular conditions and medication management.  She experiences occasional episodes of high blood pressure, particularly when rushing or going places. She was previously on carvedilol  6.25 mg twice daily, but there was concern it might have been too strong, causing her blood pressure to drop too low. She is no longer taking Eliquis  after a fall in April 2025, but continues on aspirin .  She has a history of heart failure with preserved ejection fraction, atrial fibrillation, and severe left ventricular hypertrophy with moderate mitral valve regurgitation. Her last echocardiogram showed an ejection fraction of 55-60%. She takes Lasix  five times a week and monitors her weight daily, which is usually around 115 lbs. If her weight increases to 120 lbs, she takes her regular dose of Lasix . She also has premature ventricular contractions managed by a cardiologist and takes mexiletine for this.  She has a history of chronic obstructive pulmonary disease (COPD) and smoked for 30 years. She uses  albuterol  infrequently and takes Anoro Ellipta  daily.  She has a history of polyps, diverticulosis, gallstones, acid reflux, and irritable bowel syndrome, which presents as chronic pain exacerbated by certain foods. She takes hyoscyamine  for bowel pain, which helps calm it down.  She has a history of breast cancer with a left mastectomy and reconstruction. Her left arm has been swollen since the surgery. She also has a history of a right lower lung mass, which was checked earlier this year with no significant findings. She has a history of mycobacterium avium intracellular infection, which is monitored without chronic antibiotics due to her sensitivity to them.  She takes several medications including atorvastatin  for cholesterol, biotin 10,000 mcg daily for hair thinning, Pepcid for acid reflux, Allegra daily, fish oil, gabapentin  twice daily for nerve pain in her right leg, glucosamine for bone strength, and nitroglycerin , which she has only used once. She no longer takes telmisartan , only carvedilol  for blood pressure management.  She has a history of osteopenia and degenerative disc disease of the spine, for which she takes Tylenol  Extra Strength as needed. She has not had any fractures. Her grandmother had significant bone density issues, but her mother did not. She has a history of a fall in April 2025 and uses a rolling walker for stability. She completed physical therapy about a month ago and feels steady on her feet.  She quit smoking many years ago and occasionally drinks wine, about one glass a week. She has a lady who helps with household tasks twice a week.  She has a son and a daughter who live within an hour's drive, and five grandchildren, some of whom live locally.      - Tobacco: Former smoker, 30.0 years - Alcohol: Consumes wine occasionally, about a glass a week. - Employment: Nurse in the operating room - Partner Status: Married - Living Situation: Lives with husband - Has a son  and a daughter living nearby, as well as five grandchildren. Receives help at home twice a week for household tasks.   Past Medical History:  Diagnosis Date   Allergy    Arthritis    Breast cancer (HCC)    left breast   Coronary artery disease 2001   CABG   DDD (degenerative disc disease), lumbosacral    Dyspnea    if rushing or climmbing lots of stairs   Emphysema lung (HCC)    emphysema   GERD (gastroesophageal reflux disease)    11/20/16- no a current problem   Hyperlipidemia    Hypertension    Irritable bowel syndrome (IBS)    Lung disorder    leison   Neuropathy    Pinched nerve    back and left leg   Pneumonia 2016    Past Surgical History:  Procedure Laterality Date   BREAST FIBROADENOMA SURGERY  01/13/1980   BREAST SURGERY  02/01/2004   tram/mastectomyfor recurrence   CARPOMETACARPEL SUSPENSION PLASTY Right 12/17/2017   Procedure: SUSPENSIONPLASTY RIGHT THUMB WITH EXCISION TRAPEZIUM;  Surgeon: Murrell Kuba, MD;  Location: [facility];  Service: Orthopedics;  Laterality: Right;   COLONOSCOPY     CORONARY ANGIOPLASTY  2001   had tear to two vessels- had to have open heart surgery   CORONARY ARTERY BYPASS GRAFT  2001   EYE SURGERY Bilateral    Cataract   KNEE ARTHROSCOPY  January 2011   right   MASTECTOMY PARTIAL / LUMPECTOMY W/ AXILLARY LYMPHADENECTOMY  04/10/1989   Dr Neysa / left breast   PARTIAL HYSTERECTOMY     vaginal   RIGHT/LEFT HEART CATH AND CORONARY ANGIOGRAPHY Bilateral 06/17/2023   Procedure: RIGHT/LEFT HEART CATH AND CORONARY ANGIOGRAPHY;  Surgeon: Mady Bruckner, MD;  Location: [facility];  Service: Cardiovascular;  Laterality: Bilateral;   TENDON TRANSFER Right 12/17/2017   Procedure: ABDUCTOR POLLICIS LONGUS TRANSFER;  Surgeon: Murrell Kuba, MD;  Location: [facility];  Service: Orthopedics;  Laterality: Right;   TONSILLECTOMY AND ADENOIDECTOMY     TOTAL HIP ARTHROPLASTY Right 08/05/2016   Procedure: TOTAL HIP  ARTHROPLASTY ANTERIOR APPROACH;  Surgeon: Maude Herald, MD;  Location: [facility];  Service: Orthopedics;  Laterality: Right;   UPPER GASTROINTESTINAL ENDOSCOPY     VIDEO BRONCHOSCOPY WITH ENDOBRONCHIAL NAVIGATION N/A 11/27/2016   Procedure: VIDEO BRONCHOSCOPY WITH ENDOBRONCHIAL NAVIGATION;  Surgeon: Elspeth JAYSON Millers, MD;  Location: [facility];  Service: Thoracic;  Laterality: N/A;    Allergies  Allergen Reactions   Amlodipine  Swelling    Leg swelling and fatigue.  Other Reaction(s): edema/fatigue   Amoxicillin Other (See Comments)    No reaction. Just doesn't work anymore  Other Reaction(s): not effetive-took so many times   Hydromorphone  Other (See Comments)    ALTERED MENTAL STATUS  Altered mental status   Morphine  Other (See Comments)    Pt and family report altered mental status.   Azithromycin Rash   Ciprofloxacin Nausea And Vomiting and Nausea Only   Hydromorphone  Hcl Other (See Comments)    Altered mental status Altered mental status   Levofloxacin  Nausea Only and Other (  See Comments)   Lisinopril Other (See Comments)    Fatigue  Other Reaction(s): fatigue    Outpatient Encounter Medications as of 05/18/2024  Medication Sig   acetaminophen  (TYLENOL ) 500 MG tablet Take 1,000 mg by mouth 2 (two) times daily.    albuterol  (VENTOLIN  HFA) 108 (90 Base) MCG/ACT inhaler Inhale 1-2 puffs into the lungs every 6 (six) hours as needed for wheezing or shortness of breath. prn   aspirin  EC 81 MG tablet Take 81 mg by mouth daily. Swallow whole.   atorvastatin  (LIPITOR) 20 MG tablet Take 1 tablet (20 mg total) by mouth daily.   Biotin 89999 MCG TABS Take 1 tablet by mouth daily.   carvedilol  (COREG ) 6.25 MG tablet Take 1 tablet (6.25 mg total) by mouth 2 (two) times daily with a meal.   famotidine (PEPCID) 20 MG tablet Take 20 mg by mouth daily as needed for heartburn or indigestion.   fexofenadine (ALLEGRA) 180 MG tablet Take 180 mg by mouth daily.   fish oil-omega-3 fatty acids 1000  MG capsule Take 1 g by mouth daily.    furosemide  (LASIX ) 20 MG tablet Take 1 tablet by mouth every Mon, Tues, Wed, Thurs, Fri   Glucosamine-Chondroit-Vit C-Mn (GLUCOSAMINE CHONDR 1500 COMPLX PO) Take 1 tablet by mouth once.   Hyoscyamine  Sulfate (HYOSCYAMINE  PO) Take 0.375 mg by mouth every 12 (twelve) hours as needed (for stomach pain or spasms).   mexiletine (MEXITIL ) 150 MG capsule Take 1 capsule (150 mg total) by mouth 2 (two) times daily.   Multiple Vitamin (MULTIVITAMIN) tablet Take 1 tablet by mouth daily.     nitroGLYCERIN  (NITROSTAT ) 0.4 MG SL tablet Place 0.4 mg under the tongue every 5 (five) minutes as needed for chest pain.   potassium chloride  (KLOR-CON  M) 10 MEQ tablet Take 1 tablet (10 mEq total) by mouth every other day.   umeclidinium-vilanterol (ANORO ELLIPTA ) 62.5-25 MCG/ACT AEPB INHALE 1 PUFF INTO THE LUNGS BY MOUTH ONCE DAILY   [DISCONTINUED] gabapentin  (NEURONTIN ) 300 MG capsule Take 1 capsule (300 mg total) by mouth 3 (three) times daily. (Patient taking differently: Take 300 mg by mouth 2 (two) times daily.)   [DISCONTINUED] telmisartan  (MICARDIS ) 40 MG tablet Take 40 mg by mouth daily.   gabapentin  (NEURONTIN ) 300 MG capsule Take 1 capsule (300 mg total) by mouth 2 (two) times daily.   [DISCONTINUED] ibuprofen  (ADVIL ) 200 MG tablet Take 200 mg by mouth every 8 (eight) hours as needed. (Patient not taking: Reported on 05/18/2024)   [DISCONTINUED] lactose free nutrition (BOOST) LIQD Take 237 mLs by mouth 2 (two) times daily. (Patient not taking: Reported on 05/18/2024)   No facility-administered encounter medications on file as of 05/18/2024.    Review of Systems:  Review of Systems  Health Maintenance  Topic Date Due   COVID-19 Vaccine (6 - 2024-25 season) 06/07/2023   Medicare Annual Wellness (AWV)  08/05/2023   INFLUENZA VACCINE  05/06/2024   DTaP/Tdap/Td (3 - Td or Tdap) 08/04/2032   Pneumococcal Vaccine: 50+ Years  Completed   DEXA SCAN  Completed   Zoster  Vaccines- Shingrix  Completed   Hepatitis B Vaccines  Aged Out   HPV VACCINES  Aged Out   Meningococcal B Vaccine  Aged Out   Colonoscopy  Discontinued    Physical Exam: Vitals:   05/18/24 1339  BP: 122/64  Pulse: 61  Temp: (!) 97.2 F (36.2 C)  SpO2: 93%  Weight: 116 lb 3.2 oz (52.7 kg)  Height: 5' 5 (1.651 m)  Body mass index is 19.34 kg/m. Physical Exam Physical Exam   VITALS: BP- 122/64 MEASUREMENTS: Weight- 115 lbs. Gen: Thin, Severe kyphosis HEENT: Mild cerumen in ears. CV: Irregular with PACs.  Abdomen: Soft, nontender EXTREMITIES: Mild leg edema. LUE edema      Labs reviewed: Basic Metabolic Panel: Recent Labs    07/07/23 1604 09/30/23 2213 01/10/24 1959 01/11/24 1243 01/12/24 0600 01/18/24 0506 03/15/24 1516 05/02/24 1442  NA  --    < > 138 138   < > 138 139 143  K  --    < > 3.6 3.9   < > 4.0 4.1 4.2  CL  --    < > 102 101   < > 102 104 103  CO2  --    < > 27 28   < > 28 26 26   GLUCOSE  --    < > 93 98   < > 98 105* 118*  BUN  --    < > 19 20   < > 28* 18 26  CREATININE  --    < > 0.71 0.91   < > 0.75 0.68 0.87  CALCIUM   --    < > 10.3 10.8*   < > 10.7* 10.9* 10.2  MG 2.2  --  2.1 2.0  --   --   --   --   PHOS  --   --   --  3.1  --   --   --   --   TSH 2.730  --  3.969 5.503*  --   --   --   --    < > = values in this interval not displayed.   Liver Function Tests: Recent Labs    01/11/24 1243 01/12/24 2049 01/14/24 0538  AST 43* 45* 53*  ALT 32 28 29  ALKPHOS 91 100 90  BILITOT 1.0 1.0 1.4*  PROT 7.3 6.9 6.5  ALBUMIN 3.9 3.7 3.3*   Recent Labs    11/05/23 1442  LIPASE 44   No results for input(s): AMMONIA in the last 8760 hours. CBC: Recent Labs    01/11/24 1243 01/12/24 2049 01/14/24 0538 01/18/24 0506 03/15/24 1516  WBC 5.6   < > 6.5 3.9* 3.8*  NEUTROABS 4.1  --   --   --   --   HGB 15.0   < > 14.9 13.8 15.0  HCT 44.7   < > 44.9 41.9 45.6  MCV 99.8   < > 99.6 100.0 100.4*  PLT 136*   < > 149* 151 135*   < > =  values in this interval not displayed.   Lipid Panel: Recent Labs    06/01/23 1018  CHOL 145  HDL 67  LDLCALC 67  TRIG 53  CHOLHDL 2.2   No results found for: HGBA1C  Procedures since last visit: No results found. Results   Echocardiogram: Ejection fraction 55-60%, severe left ventricular hypertrophy, moderate mitral regurgitation.      Assessment/Plan     Heart failure with preserved ejection fraction, severe left ventricular hypertrophy, moderate mitral valve regurgitation, and chronic lower extremity edema Heart failure with preserved ejection fraction, severe left ventricular hypertrophy, and moderate mitral valve regurgitation. Chronic lower extremity edema managed with Lasix . Echocardiogram shows ejection fraction of 55-60%. - Continue Lasix  five times a week - Monitor weight daily  Atrial fibrillation and labile hypertension Atrial fibrillation with labile hypertension. Blood pressure medication adjusted due to previous low readings. Current blood  pressure is 122/64 mmHg. Previously on Eliquis , discontinued due to fall risk. Currently on aspirin . - Continue aspirin   Premature ventricular contractions Premature ventricular contractions managed by cardiologist. Currently on mexiletine. - Continue mexiletine  Chronic obstructive pulmonary disease (COPD) COPD likely secondary to 30-year smoking history. Managed with Anoro Ellipta  and albuterol  as needed. - Continue Anoro Ellipta  daily - Use albuterol  as needed  Irritable bowel syndrome with chronic abdominal pain Chronic abdominal pain due to irritable bowel syndrome. Pain exacerbated by certain foods. Managed with hyoscyamine , which provides relief but has potential side effects like dry mouth and confusion. - Continue hyoscyamine  as needed - Monitor for side effects of hyoscyamine   Degenerative disc disease of the spine with chronic right leg neuropathic pain Degenerative disc disease with chronic right leg  neuropathic pain. Managed with gabapentin  for nerve pain. - Continue gabapentin  twice daily  Osteopenia Osteopenia with no history of fractures. Family history of bone density issues. Bone density re-evaluation planned. - Order bone density scan  Hyperlipidemia Hyperlipidemia managed with atorvastatin . - Continue atorvastatin   Left breast cancer with left mastectomy and chronic left arm lymphedema Left breast cancer with mastectomy and reconstruction. Chronic left arm lymphedema. Avoid blood draws on the left arm to prevent worsening of swelling. - Avoid blood draws on left arm  Mycobacterium avium complex (MAC) infection, under surveillance MAC infection, currently under surveillance. No chronic antibiotics due to risk of thrush. - Continue surveillance for MAC infection  Driving safety concerns Concerns about driving safety due to a past incident. Occupational therapy evaluation recommended to assess driving safety objectively. - Recommend occupational therapy driving evaluation  Hearing loss Reported hearing loss not confirmed. No previous hearing test conducted. Consider evaluation if communication difficulties arise. - Consider hearing evaluation if needed       Labs/tests ordered:  * No order type specified * Next appt:  06/22/2024  I spent greater than 60 minutes for the care of this patient in face to face time, chart review, clinical documentation, patient education.

## 2024-06-02 ENCOUNTER — Telehealth: Payer: Self-pay | Admitting: *Deleted

## 2024-06-02 NOTE — Telephone Encounter (Signed)
 Delon Finger, Social Worker, with [employer] walked into office to clarify the orders for patient's OT Referral for [facility] - Driving safety evaluation   She is wanting to know if this is for a Car or for a Scooter.   Please Advise.   Call OT with Clarification, Rosina sherlynn Chessman, #[phone]

## 2024-06-03 NOTE — Telephone Encounter (Signed)
 This should be for driving a car. If campus has concerns for safety on scooter can evaluate for this use as well.

## 2024-06-07 DIAGNOSIS — J449 Chronic obstructive pulmonary disease, unspecified: Secondary | ICD-10-CM | POA: Diagnosis not present

## 2024-06-07 DIAGNOSIS — G629 Polyneuropathy, unspecified: Secondary | ICD-10-CM | POA: Diagnosis not present

## 2024-06-07 DIAGNOSIS — I5032 Chronic diastolic (congestive) heart failure: Secondary | ICD-10-CM | POA: Diagnosis not present

## 2024-06-07 DIAGNOSIS — R2689 Other abnormalities of gait and mobility: Secondary | ICD-10-CM | POA: Diagnosis not present

## 2024-06-07 DIAGNOSIS — R4189 Other symptoms and signs involving cognitive functions and awareness: Secondary | ICD-10-CM | POA: Diagnosis not present

## 2024-06-07 DIAGNOSIS — R278 Other lack of coordination: Secondary | ICD-10-CM | POA: Diagnosis not present

## 2024-06-07 DIAGNOSIS — Z9181 History of falling: Secondary | ICD-10-CM | POA: Diagnosis not present

## 2024-06-07 DIAGNOSIS — G9340 Encephalopathy, unspecified: Secondary | ICD-10-CM | POA: Diagnosis not present

## 2024-06-07 DIAGNOSIS — Z741 Need for assistance with personal care: Secondary | ICD-10-CM | POA: Diagnosis not present

## 2024-06-07 DIAGNOSIS — M6281 Muscle weakness (generalized): Secondary | ICD-10-CM | POA: Diagnosis not present

## 2024-06-07 DIAGNOSIS — I4891 Unspecified atrial fibrillation: Secondary | ICD-10-CM | POA: Diagnosis not present

## 2024-06-07 NOTE — Telephone Encounter (Signed)
 Janci, Nurse at [facility], Aware.

## 2024-06-08 ENCOUNTER — Telehealth: Payer: Self-pay | Admitting: Adult Health

## 2024-06-08 DIAGNOSIS — J449 Chronic obstructive pulmonary disease, unspecified: Secondary | ICD-10-CM | POA: Diagnosis not present

## 2024-06-08 DIAGNOSIS — J9611 Chronic respiratory failure with hypoxia: Secondary | ICD-10-CM | POA: Diagnosis not present

## 2024-06-08 NOTE — Telephone Encounter (Signed)
 Patient called on call provider regarding elevated BP 1) 176/97, 2) 162/79, 3) 165/69. She is currently taking Carvedilol  25 mg BID. Recommended for her to call tomorrow and make an appointment to be seen in the clinic for BP evaluation.

## 2024-06-09 NOTE — Telephone Encounter (Signed)
 Tried calling patient to schedule an appointment. LMOM to return call.

## 2024-06-09 NOTE — Telephone Encounter (Signed)
 Tried calling patient to schedule an appointment at the [facility]. LMOM to return call.

## 2024-06-09 NOTE — Telephone Encounter (Signed)
 Tiffany Caldwell has attempted to call to schedule appt

## 2024-06-14 ENCOUNTER — Ambulatory Visit: Attending: Medical | Admitting: Medical

## 2024-06-14 ENCOUNTER — Encounter: Payer: Self-pay | Admitting: Medical

## 2024-06-14 VITALS — BP 120/70 | HR 58 | Ht 65.0 in | Wt 115.5 lb

## 2024-06-14 DIAGNOSIS — I25119 Atherosclerotic heart disease of native coronary artery with unspecified angina pectoris: Secondary | ICD-10-CM | POA: Diagnosis not present

## 2024-06-14 DIAGNOSIS — E785 Hyperlipidemia, unspecified: Secondary | ICD-10-CM

## 2024-06-14 DIAGNOSIS — I5032 Chronic diastolic (congestive) heart failure: Secondary | ICD-10-CM

## 2024-06-14 DIAGNOSIS — I491 Atrial premature depolarization: Secondary | ICD-10-CM

## 2024-06-14 DIAGNOSIS — I10 Essential (primary) hypertension: Secondary | ICD-10-CM

## 2024-06-14 DIAGNOSIS — I480 Paroxysmal atrial fibrillation: Secondary | ICD-10-CM

## 2024-06-14 NOTE — Patient Instructions (Signed)
 Medication Instructions:  Your physician recommends that you continue on your current medications as directed. Please refer to the Current Medication list given to you today.   *If you need a refill on your cardiac medications before your next appointment, please call your pharmacy*  Lab Work: No labs ordered today  If you have labs (blood work) drawn today and your tests are completely normal, you will receive your results only by: MyChart Message (if you have MyChart) OR A paper copy in the mail If you have any lab test that is abnormal or we need to change your treatment, we will call you to review the results.  Testing/Procedures: No test ordered today   Follow-Up: At [facility], you and your health needs are our priority.  As part of our continuing mission to provide you with exceptional heart care, our providers are all part of one team.  This team includes your primary Cardiologist (physician) and Advanced Practice Providers or APPs (Physician Assistants and Nurse Practitioners) who all work together to provide you with the care you need, when you need it.  Your next appointment:   4 month(s)  Provider:   You may see Lonni Hanson, MD or one of the following Advanced Practice Providers on your designated Care Team:   Lonni Meager, NP Lesley Maffucci, PA-C Bernardino Bring, PA-C Cadence Sterling, PA-C Tylene Lunch, NP Barnie Hila, NP    We recommend signing up for the patient portal called MyChart.  Sign up information is provided on this After Visit Summary.  MyChart is used to connect with patients for Virtual Visits (Telemedicine).  Patients are able to view lab/test results, encounter notes, upcoming appointments, etc.  Non-urgent messages can be sent to your provider as well.   To learn more about what you can do with MyChart, go to [email or web address].

## 2024-06-14 NOTE — Progress Notes (Signed)
 Cardiology Office Note   Date:  06/14/2024  ID:  Freyja, Govea [birth year], MRN [id number] PCP: Abdul Fine, MD  [facility] Providers Cardiologist:  Lonni Hanson, MD Electrophysiologist:  Fonda Kitty, MD   History of Present Illness Tiffany Caldwell is a 83 y.o. female with a hx of CAD status post remote PCI with subsequent emergent three-vessel CABG for iatrogenic left main/LAD dissection in 2001, right lower lobe cavitary lung mass, labile hypertension, hyperlipidemia, HFpEF, left-sided breast cancer, COPD, IBS, GERD who presents for follow-up for BP.   In 2001 patient underwent CABG x 3 with LIMA to LAD, SVG to diagonal, and SVG to OM.  Echo from 2016 showed EF of 55 to 60%, grade 1 diastolic dysfunction.  Lexiscan  in 2020 showed no significant ischemia and was overall low risk.  Echo in 2020 showed EF of 65 to 70%, grade 1 diastolic dysfunction, mild AI. More recently, amlodipine  stopped due to lower leg edema.  Patient was started on Lasix .     Echo in August 2024 showed EF of 55 to 60%, severe asymmetric LVH without LVOT obstruction, grade 2 diastolic dysfunction.  Left heart cath 06/2023 showed severe single-vessel CAD with CTO of the proximal to mid LAD stent, with mid distal LAD supplied by patent LIMA graft.  There was also 60 to 70% stenosis at the ostium of the OM1, 50% mid left circumflex disease, 30% proximal RCA stenosis.  Widely patent LIMA to LAD, chronically occluded SVG to D1 and SVG to OM 2.  Normal left and right heart filling pressures.  Mild to moderate pulmonary hypertension.  It was felt chronic lung disease was mostly driving patient's symptoms.  if symptoms persisted, recommended cardiac MRI  to evaluate severe asymmetric LVH. cMRI showed normal LV size and function, mild LV wall thickness, no LGE or scar, normal RVSF, no significant valvular abnormality and no inflammatory or infiltrative disease.   Patient was seen in October 2024 reporting  persistent breathing issues, felt to be from the lungs.  Heart monitor was ordered to evaluate PVC burden. cMRI has not been completed. Heart monitor showed 22.7% PVC burden and she was referred to EP who stated her on mexiletine.   At hospital follow-up on 11/2023 it was noted EKG from the hospital showed Afib and she was started on Eliquis . Since that time she has been to the hospital many times. She was admitted 01/2024 for a mechanical fall. Imaging was unremarkable. Eliquis  was stopped due to fall risk. HS Troponin was elevated to 101, no anginal symptoms were reported.    She was seen in the office 04/05/24 reporting labile pressures. BP was soft 100/40 and Coreg  was decreased.    She called in 04/22/24 noting elevated blood pressures and it was recommended she start Telmisartan  40mg  daily.   The patient was last seen 04/25/24 and was taking coreg  6.25mg BID, Bps 130-150s/60-70s, but these were before taking Coreg .  BP was 135/72. We continued coreg  6.25mg BID.   Today, the patient report she is overall doing well. She denies chest pain. She has chronic SOB from emphysema. BP has been good at home. She has been taking lasix  M-F.   Studies Reviewed EKG Interpretation Date/Time:  Tuesday June 14 2024 14:26:41 EDT Ventricular Rate:  58 PR Interval:  164 QRS Duration:  154 QT Interval:  492 QTC Calculation: 482 R Axis:   -44  Text Interpretation: Sinus bradycardia with Premature supraventricular complexes Left axis deviation Right bundle branch block Left ventricular hypertrophy  with repolarization abnormality ( R in aVL , Romhilt-Estes ) When compared with ECG of 05-Apr-2024 14:40, Premature supraventricular complexes are now Present PR interval has decreased Minimal criteria for Septal infarct are no longer Present T wave inversion no longer evident in Inferior leads Confirmed by Franchester, Landrum Carbonell ([id number]) on 06/14/2024 4:47:58 PM    cMRI 02/2024  IMPRESSION: 1.  Normal LV size, normal LV  systolic function.  LVEF 67%.   2.  Mild LV wall thickness.   3.  No LGE or scar.   4.  Normal RV systolic function   5.  No significant valvular abnormalities   6.  No inflammatory or infiltrative disease.       Heart monitor 08/2023    The patient was monitored for 13 days, 23 hours.   The predominant rhythm was sinus with an average rate of 62 bpm (range 42-86 bpm in sinus).   There were occasional PACs (4.2% burden) and frequent PVCs (22.7% burden).   3 episodes of nonsustained ventricular tachycardia occurred, lasting up to 9 beats with a maximum rate of 176 bpm.   There were 41 supraventricular runs, lasting up to 16 beats with a maximum rate of 164 bpm.   No sustained arrhythmia or prolonged pause was observed.   There were no patient triggered events.   Predominantly sinus rhythm with frequent PVCs and occasional PACs.  Three episodes of NSVT and multiple supraventricular runs were observed, as detailed above.   R/L heart cath 06/2023 Conclusions: Severe single-vessel coronary artery disease with chronic total occlusion of proximal/mid LAD stent, with mid/distal LAD supplied by patent LIMA graft.  There is also 60-70% stenosis at the ostium of OM1, 50% mid LCx disease, and 30% proximal RCA stenosis. Widely patent LIMA-LAD. Chronically occluded SVG-D1 and SVG-OM1. Normal left and right heart filling pressures (LVEDP 14 mmHg, mean RA 3 mmHg, RVEDP 6 mmHg). Mild to moderate pulmonary hypertension (PA 50/14, mean 26 mmHg). Normal Fick cardiac output/index (CO 5.3 L/min, CI 3.2 L/min/m).   Recommendations: Continue follow-up with pulmonology for management of chronic lung disease that is most likely driving Ms. Dicson's chronic exertional dyspnea and mild to moderate pulmonary hypertension.  If symptoms persist, 1 could consider cardiac MRI to better evaluate severe asymmetric left ventricular hypertrophy as well as consultation with the advanced heart failure  service. Aggressive secondary prevention of coronary artery disease.  Favor medical management of 60-70% ostial OM1 and 50% mid LCx disease.   Lonni Hanson, MD [facility]     Physical Exam VS:  BP 120/70 (BP Location: Left Arm, Patient Position: Sitting, Cuff Size: Normal)   Pulse (!) 58   Ht 5' 5 (1.651 m)   Wt 115 lb 8 oz (52.4 kg)   LMP  (LMP Unknown)   SpO2 94%   BMI 19.22 kg/m        Wt Readings from Last 3 Encounters:  06/14/24 115 lb 8 oz (52.4 kg)  05/18/24 116 lb 3.2 oz (52.7 kg)  04/25/24 119 lb 6.4 oz (54.2 kg)    GEN: Well nourished, well developed in no acute distress NECK: No JVD; No carotid bruits CARDIAC: RRR, no murmurs, rubs, gallops RESPIRATORY:  Clear to auscultation without rales, wheezing or rhonchi  ABDOMEN: Soft, non-tender, non-distended EXTREMITIES:  mild lower leg edema; No deformity   ASSESSMENT AND PLAN  HTN Blood pressure at home has been good.  Blood pressure today is 120/70.  Will continue Coreg  6.25 mg twice daily.  Lower leg edema  HFpEF Swelling is better on Lasix  Monday through Friday.  Follow-up labs looked good.  She still has mild swelling on exam.  Will continue current Lasix  dose.  We discussed leg elevation, low-salt diet, and compression socks.  Paroxysmal Afib Patient is in normal sinus rhythm on EKG.  Eliquis  stopped 01/2024 due to fall risk.  Continue aspirin  81 mg daily.  CAD s/p CBAG x 3 Patient denies chest pain.  She has known CAD by heart cath 06/2023 with no PCI targets.  Continue aspirin , Coreg , and Lipitor.  PVCs Continue mexiletine per EP.       Dispo: Follow-up in 4 months  Signed, Stark Aguinaga VEAR Fishman, PA-C

## 2024-06-20 DIAGNOSIS — M8580 Other specified disorders of bone density and structure, unspecified site: Secondary | ICD-10-CM | POA: Diagnosis not present

## 2024-06-20 DIAGNOSIS — I480 Paroxysmal atrial fibrillation: Secondary | ICD-10-CM | POA: Diagnosis not present

## 2024-06-20 DIAGNOSIS — M1811 Unilateral primary osteoarthritis of first carpometacarpal joint, right hand: Secondary | ICD-10-CM | POA: Diagnosis not present

## 2024-06-20 DIAGNOSIS — I700 Atherosclerosis of aorta: Secondary | ICD-10-CM | POA: Diagnosis not present

## 2024-06-21 LAB — COMPLETE METABOLIC PANEL WITHOUT GFR
AG Ratio: 1.3 (calc) (ref 1.0–2.5)
ALT: 26 U/L (ref 6–29)
AST: 39 U/L — ABNORMAL HIGH (ref 10–35)
Albumin: 3.9 g/dL (ref 3.6–5.1)
Alkaline phosphatase (APISO): 106 U/L (ref 37–153)
BUN: 21 mg/dL (ref 7–25)
CO2: 30 mmol/L (ref 20–32)
Calcium: 10.9 mg/dL — ABNORMAL HIGH (ref 8.6–10.4)
Chloride: 104 mmol/L (ref 98–110)
Creat: 0.71 mg/dL (ref 0.60–0.95)
Globulin: 3.1 g/dL (ref 1.9–3.7)
Glucose, Bld: 97 mg/dL (ref 65–99)
Potassium: 4.2 mmol/L (ref 3.5–5.3)
Sodium: 140 mmol/L (ref 135–146)
Total Bilirubin: 0.6 mg/dL (ref 0.2–1.2)
Total Protein: 7 g/dL (ref 6.1–8.1)

## 2024-06-21 LAB — CBC WITH DIFFERENTIAL/PLATELET
Absolute Lymphocytes: 1101 {cells}/uL (ref 850–3900)
Absolute Monocytes: 456 {cells}/uL (ref 200–950)
Basophils Absolute: 30 {cells}/uL (ref 0–200)
Basophils Relative: 0.7 %
Eosinophils Absolute: 52 {cells}/uL (ref 15–500)
Eosinophils Relative: 1.2 %
HCT: 42.4 % (ref 35.0–45.0)
Hemoglobin: 13.8 g/dL (ref 11.7–15.5)
MCH: 32.5 pg (ref 27.0–33.0)
MCHC: 32.5 g/dL (ref 32.0–36.0)
MCV: 100 fL (ref 80.0–100.0)
MPV: 12.8 fL — ABNORMAL HIGH (ref 7.5–12.5)
Monocytes Relative: 10.6 %
Neutro Abs: 2662 {cells}/uL (ref 1500–7800)
Neutrophils Relative %: 61.9 %
Platelets: 152 Thousand/uL (ref 140–400)
RBC: 4.24 Million/uL (ref 3.80–5.10)
RDW: 13.2 % (ref 11.0–15.0)
Total Lymphocyte: 25.6 %
WBC: 4.3 Thousand/uL (ref 3.8–10.8)

## 2024-06-21 LAB — HEMOGLOBIN A1C
Hgb A1c MFr Bld: 6.2 % — ABNORMAL HIGH (ref <5.7)
Mean Plasma Glucose: 131 mg/dL
eAG (mmol/L): 7.3 mmol/L

## 2024-06-21 LAB — VITAMIN D 25 HYDROXY (VIT D DEFICIENCY, FRACTURES): Vit D, 25-Hydroxy: 49 ng/mL (ref 30–100)

## 2024-06-21 LAB — TSH: TSH: 3.1 m[IU]/L (ref 0.40–4.50)

## 2024-06-21 LAB — LIPID PANEL
Cholesterol: 180 mg/dL (ref <200)
HDL: 66 mg/dL (ref >=50)
LDL Cholesterol (Calc): 95 mg/dL
Non-HDL Cholesterol (Calc): 114 mg/dL (ref <130)
Total CHOL/HDL Ratio: 2.7 (calc) (ref <5.0)
Triglycerides: 93 mg/dL (ref <150)

## 2024-06-21 LAB — VITAMIN B12: Vitamin B-12: 477 pg/mL (ref 200–1100)

## 2024-06-22 ENCOUNTER — Encounter: Admitting: Student

## 2024-06-29 ENCOUNTER — Encounter: Admitting: Student

## 2024-07-06 ENCOUNTER — Encounter: Admitting: Student

## 2024-07-08 DIAGNOSIS — J9611 Chronic respiratory failure with hypoxia: Secondary | ICD-10-CM | POA: Diagnosis not present

## 2024-07-08 DIAGNOSIS — J449 Chronic obstructive pulmonary disease, unspecified: Secondary | ICD-10-CM | POA: Diagnosis not present

## 2024-07-20 ENCOUNTER — Other Ambulatory Visit: Payer: Self-pay | Admitting: Cardiology

## 2024-07-25 ENCOUNTER — Other Ambulatory Visit: Payer: Self-pay | Admitting: Cardiology

## 2024-07-27 ENCOUNTER — Encounter: Admitting: Orthopedic Surgery

## 2024-08-03 ENCOUNTER — Other Ambulatory Visit: Payer: Self-pay | Admitting: Nurse Practitioner

## 2024-08-03 ENCOUNTER — Encounter: Payer: Self-pay | Admitting: Orthopedic Surgery

## 2024-08-03 ENCOUNTER — Non-Acute Institutional Stay: Admitting: Orthopedic Surgery

## 2024-08-03 VITALS — BP 138/88 | HR 61 | Temp 96.4°F | Ht 65.0 in | Wt 111.0 lb

## 2024-08-03 DIAGNOSIS — R5383 Other fatigue: Secondary | ICD-10-CM | POA: Diagnosis not present

## 2024-08-03 DIAGNOSIS — F03B4 Unspecified dementia, moderate, with anxiety: Secondary | ICD-10-CM | POA: Diagnosis not present

## 2024-08-03 DIAGNOSIS — I480 Paroxysmal atrial fibrillation: Secondary | ICD-10-CM

## 2024-08-03 DIAGNOSIS — F03A4 Unspecified dementia, mild, with anxiety: Secondary | ICD-10-CM | POA: Diagnosis not present

## 2024-08-03 DIAGNOSIS — I5032 Chronic diastolic (congestive) heart failure: Secondary | ICD-10-CM | POA: Diagnosis not present

## 2024-08-03 DIAGNOSIS — J439 Emphysema, unspecified: Secondary | ICD-10-CM

## 2024-08-03 DIAGNOSIS — F419 Anxiety disorder, unspecified: Secondary | ICD-10-CM

## 2024-08-03 MED ORDER — SERTRALINE HCL 25 MG PO TABS
25.0000 mg | ORAL_TABLET | Freq: Every day | ORAL | 3 refills | Status: AC
Start: 2024-08-03 — End: ?

## 2024-08-03 NOTE — Patient Instructions (Addendum)
 Lab visit 11/17 at 0730 am to recheck sodium level to make sure Zoloft is safe   Please get flu vaccine and covid vaccine apart   Snack more often and see if increased calories helps  flu like feeling

## 2024-08-03 NOTE — Progress Notes (Unsigned)
 Location:  Other Nursing Home Room Number: [facility] Place of Service:  Clinic ([phone]) Provider:  Greig FORBES Cluster, NP   Cluster Greig FORBES, NP  Patient Care Team: Cluster Greig FORBES, NP as PCP - General (Adult Health Nurse Practitioner) End, Lonni, MD as PCP - Cardiology (Cardiology) Kennyth Chew, MD as PCP - Electrophysiology (Cardiology) Jakie Alm SAUNDERS, MD as Attending Physician (Gastroenterology) Claudene MICAEL Rogerio Mickey., MD (Obstetrics and Gynecology) Jason Charleston, MD (Inactive) (Radiation Oncology) Tona Vinie RAMAN, MD (Hematology and Oncology) Sheril Coy, MD as Consulting Physician (Orthopedic Surgery) Joshua Blamer, MD as Attending Physician (Dermatology) Leslee Reusing, MD as Consulting Physician (Ophthalmology) Shelah Charleston RAMAN, MD as Consulting Physician (Pulmonary Disease)  Extended Emergency Contact Information Primary Emergency Contact: [facility] Address: [address]          [place], [place] [address] [place] Phone: [phone] Relation: Spouse Secondary Emergency Contact: Uttech,John Address: [address]          [place], [place] [address] [place] Home Phone: [phone] Relation: Son  Code Status:  DNR Goals of care: Advanced Directive information    08/03/2024    2:27 PM  Advanced Directives  Does Patient Have a Medical Advance Directive? Yes  Type of Estate Agent of [place];Living will  Does patient want to make changes to medical advance directive? No - Patient declined  Copy of Healthcare Power of Attorney in Chart? No - copy requested     Chief Complaint  Patient presents with   Medical Management of Chronic Issues    Medical management of Chronic Issues. 1 Month follow up.     HPI:  Pt is a 83 y.o. female seen today for medical management of chronic diseases.    Discussed the use of AI scribe software for clinical note transcription with the patient, who gave verbal  consent to proceed.  History of Present Illness   Tiffany Caldwell is an 83 year old female with cardiac conditions and emphysema who presents with episodes of feeling unwell. She is accompanied by her husband, [name].  She experiences episodes of feeling unwell, described as similar to 'getting the flu,' primarily in the evening, though her husband notes it can occur at other times, including the morning. These episodes occur almost daily and have been ongoing for quite some time. During these episodes, she moans and feels generally unwell, with increased anxiousness.   She has a history of cardiac conditions, including atrial fibrillation, heart failure, and coronary artery disease. She is on carvedilol  for her heart. Her recent blood work, including thyroid  function, vitamin D , B12, and cholesterol levels, were reported as normal.   She is also prediabetic.A1c 6.2 06/20/2024.   She has emphysema and uses Anoro Ellipta  inhaler.   No recent falls or accidents and she uses a rollator for mobility.  Her appetite is described as 'pretty good,' and she eats three meals a day, including a lot of fruit, chicken salad, and sandwiches. She has lost four pounds since July, which she attributes to eating less, though she still maintains a regular diet.  She takes gabapentin  for nerve pain in her legs, which she describes as difficult to articulate but not neuropathy. No feelings of anxiety or depression, though her husband notes some memory issues and increased frequency of feeling unwell.        Past Medical History:  Diagnosis Date   Allergy    Arthritis    Breast cancer (  HCC)    left breast   Coronary artery disease 2001   CABG   DDD (degenerative disc disease), lumbosacral    Dyspnea    if rushing or climmbing lots of stairs   Emphysema lung (HCC)    emphysema   GERD (gastroesophageal reflux disease)    11/20/16- no a current problem   Hyperlipidemia    Hypertension    Irritable bowel  syndrome (IBS)    Lung disorder    leison   Neuropathy    Pinched nerve    back and left leg   Pneumonia 2016   Past Surgical History:  Procedure Laterality Date   BREAST FIBROADENOMA SURGERY  01/13/1980   BREAST SURGERY  02/01/2004   tram/mastectomyfor recurrence   CARPOMETACARPEL SUSPENSION PLASTY Right 12/17/2017   Procedure: SUSPENSIONPLASTY RIGHT THUMB WITH EXCISION TRAPEZIUM;  Surgeon: Murrell Kuba, MD;  Location: [facility];  Service: Orthopedics;  Laterality: Right;   COLONOSCOPY     CORONARY ANGIOPLASTY  2001   had tear to two vessels- had to have open heart surgery   CORONARY ARTERY BYPASS GRAFT  2001   EYE SURGERY Bilateral    Cataract   KNEE ARTHROSCOPY  January 2011   right   MASTECTOMY PARTIAL / LUMPECTOMY W/ AXILLARY LYMPHADENECTOMY  04/10/1989   Dr Neysa / left breast   PARTIAL HYSTERECTOMY     vaginal   RIGHT/LEFT HEART CATH AND CORONARY ANGIOGRAPHY Bilateral 06/17/2023   Procedure: RIGHT/LEFT HEART CATH AND CORONARY ANGIOGRAPHY;  Surgeon: Mady Bruckner, MD;  Location: [facility];  Service: Cardiovascular;  Laterality: Bilateral;   TENDON TRANSFER Right 12/17/2017   Procedure: ABDUCTOR POLLICIS LONGUS TRANSFER;  Surgeon: Murrell Kuba, MD;  Location: [facility];  Service: Orthopedics;  Laterality: Right;   TONSILLECTOMY AND ADENOIDECTOMY     TOTAL HIP ARTHROPLASTY Right 08/05/2016   Procedure: TOTAL HIP ARTHROPLASTY ANTERIOR APPROACH;  Surgeon: Maude Herald, MD;  Location: [facility];  Service: Orthopedics;  Laterality: Right;   UPPER GASTROINTESTINAL ENDOSCOPY     VIDEO BRONCHOSCOPY WITH ENDOBRONCHIAL NAVIGATION N/A 11/27/2016   Procedure: VIDEO BRONCHOSCOPY WITH ENDOBRONCHIAL NAVIGATION;  Surgeon: Elspeth JAYSON Millers, MD;  Location: [facility];  Service: Thoracic;  Laterality: N/A;    Allergies  Allergen Reactions   Amlodipine  Swelling    Leg swelling and fatigue.  Other Reaction(s): edema/fatigue   Amoxicillin Other (See  Comments)    No reaction. Just doesn't work anymore  Other Reaction(s): not effetive-took so many times   Hydromorphone  Other (See Comments)    ALTERED MENTAL STATUS  Altered mental status   Morphine  Other (See Comments)    Pt and family report altered mental status.   Azithromycin Rash   Ciprofloxacin Nausea And Vomiting and Nausea Only   Hydromorphone  Hcl Other (See Comments)    Altered mental status Altered mental status   Levofloxacin  Nausea Only and Other (See Comments)   Lisinopril Other (See Comments)    Fatigue  Other Reaction(s): fatigue    Outpatient Encounter Medications as of 08/03/2024  Medication Sig   acetaminophen  (TYLENOL ) 500 MG tablet Take 1,000 mg by mouth 2 (two) times daily.    albuterol  (VENTOLIN  HFA) 108 (90 Base) MCG/ACT inhaler Inhale 1-2 puffs into the lungs every 6 (six) hours as needed for wheezing or shortness of breath. prn   aspirin  EC 81 MG tablet Take 81 mg by mouth daily. Swallow whole.   atorvastatin  (LIPITOR) 20 MG tablet Take 1 tablet (20 mg total) by mouth  daily.   Biotin 89999 MCG TABS Take 1 tablet by mouth daily.   carvedilol  (COREG ) 6.25 MG tablet Take 1 tablet (6.25 mg total) by mouth 2 (two) times daily with a meal.   famotidine (PEPCID) 20 MG tablet Take 20 mg by mouth daily as needed for heartburn or indigestion.   fexofenadine (ALLEGRA) 180 MG tablet Take 180 mg by mouth daily.   fish oil-omega-3 fatty acids 1000 MG capsule Take 1 g by mouth daily.    furosemide  (LASIX ) 20 MG tablet Take 1 tablet by mouth every Mon, Tues, Wed, Thurs, Fri   gabapentin  (NEURONTIN ) 300 MG capsule Take 1 capsule (300 mg total) by mouth 2 (two) times daily.   Glucosamine-Chondroit-Vit C-Mn (GLUCOSAMINE CHONDR 1500 COMPLX PO) Take 1 tablet by mouth once.   Hyoscyamine  Sulfate (HYOSCYAMINE  PO) Take 0.375 mg by mouth every 12 (twelve) hours as needed (for stomach pain or spasms).   mexiletine (MEXITIL ) 150 MG capsule Take 1 capsule (150 mg total) by mouth 2  (two) times daily.   Multiple Vitamin (MULTIVITAMIN) tablet Take 1 tablet by mouth daily.     nitroGLYCERIN  (NITROSTAT ) 0.4 MG SL tablet Place 0.4 mg under the tongue every 5 (five) minutes as needed for chest pain.   potassium chloride  (KLOR-CON  M) 10 MEQ tablet Take 1 tablet (10 mEq total) by mouth every other day.   umeclidinium-vilanterol (ANORO ELLIPTA ) 62.5-25 MCG/ACT AEPB INHALE 1 PUFF INTO THE LUNGS BY MOUTH ONCE DAILY   No facility-administered encounter medications on file as of 08/03/2024.    Review of Systems  Unable to perform ROS: Dementia    Immunization History  Administered Date(s) Administered   Fluad Trivalent(High Dose 65+) 07/22/2023   Fluzone Influenza virus vaccine,trivalent (IIV3), split virus 07/17/2009, 07/07/2011, 07/28/2013, 07/17/2014, 07/26/2015, 07/18/2016, 07/27/2017   INFLUENZA, HIGH DOSE SEASONAL PF 07/01/2018, 08/02/2018, 08/07/2019, 08/06/2021   Influenza,inj,Quad PF,6+ Mos 07/26/2015   Influenza-Unspecified 08/15/2014, 07/08/2019, 07/04/2022   Moderna Covid-19 Vaccine Bivalent Booster 60yrs & up 07/19/2021, 08/06/2021   Moderna SARS-COV2 Booster Vaccination 08/17/2020   Moderna Sars-Covid-2 Vaccination 10/18/2019, 11/15/2019, 01/22/2021   PNEUMOCOCCAL CONJUGATE-20 08/06/2023   Pneumococcal Conjugate-13 05/22/2014   Pneumococcal Polysaccharide-23 01/24/2005, 07/01/2018   Pneumococcal-Unspecified 06/15/2014   RSV,unspecified 07/04/2022   Td 08/04/2022   Td (Adult) 12/27/2001   Tdap 05/12/2012   Unspecified SARS-COV-2 Vaccination 08/29/2022   Zoster Recombinant(Shingrix) 03/20/2018, 08/22/2018   Zoster, Live 08/10/2006, 03/20/2018, 08/24/2018   Pertinent  Health Maintenance Due  Topic Date Due   Mammogram  03/04/2008   Influenza Vaccine  05/06/2024   DEXA SCAN  Completed   Colonoscopy  Discontinued      07/21/2019    6:45 PM 08/04/2019   12:02 AM 08/11/2022    5:49 PM 05/18/2024    1:46 PM 08/03/2024    2:26 PM  Fall Risk  Falls in the  past year?    0 0  Was there an injury with Fall?    0 0  Fall Risk Category Calculator    0 0  (RETIRED) Patient Fall Risk Level Low fall risk  Low fall risk  Low fall risk     Patient at Risk for Falls Due to    Impaired balance/gait;Impaired mobility Impaired balance/gait;Impaired mobility  Fall risk Follow up    Falls evaluation completed Falls evaluation completed     Data saved with a previous flowsheet row definition   Functional Status Survey:    Vitals:   08/03/24 1425  BP: 138/88  Pulse: 61  Temp: (!) 96.4 F (35.8 C)  SpO2: 96%  Weight: 111 lb (50.3 kg)  Height: 5' 5 (1.651 m)   Body mass index is 18.47 kg/m. Physical Exam Vitals reviewed.  Constitutional:      General: She is not in acute distress.    Appearance: She is not ill-appearing.  HENT:     Head: Normocephalic.  Eyes:     General:        Right eye: No discharge.        Left eye: No discharge.  Cardiovascular:     Rate and Rhythm: Normal rate and regular rhythm.     Pulses: Normal pulses.     Heart sounds: Normal heart sounds.  Pulmonary:     Effort: Pulmonary effort is normal.     Breath sounds: Normal breath sounds.  Abdominal:     General: Bowel sounds are normal.     Palpations: Abdomen is soft.  Musculoskeletal:     Cervical back: Neck supple.     Right lower leg: No edema.     Left lower leg: No edema.  Skin:    General: Skin is warm.     Capillary Refill: Capillary refill takes less than 2 seconds.  Neurological:     General: No focal deficit present.     Mental Status: She is alert. Mental status is at baseline.     Gait: Gait abnormal.  Psychiatric:        Mood and Affect: Mood is anxious.     Labs reviewed: Recent Labs    01/10/24 1959 01/11/24 1243 01/12/24 0600 03/15/24 1516 05/02/24 1442 06/20/24 0905  NA 138 138   < > 139 143 140  K 3.6 3.9   < > 4.1 4.2 4.2  CL 102 101   < > 104 103 104  CO2 27 28   < > 26 26 30   GLUCOSE 93 98   < > 105* 118* 97  BUN 19 20    < > 18 26 21   CREATININE 0.71 0.91   < > 0.68 0.87 0.71  CALCIUM  10.3 10.8*   < > 10.9* 10.2 10.9*  MG 2.1 2.0  --   --   --   --   PHOS  --  3.1  --   --   --   --    < > = values in this interval not displayed.   Recent Labs    01/11/24 1243 01/12/24 2049 01/14/24 0538 06/20/24 0905  AST 43* 45* 53* 39*  ALT 32 28 29 26   ALKPHOS 91 100 90  --   BILITOT 1.0 1.0 1.4* 0.6  PROT 7.3 6.9 6.5 7.0  ALBUMIN 3.9 3.7 3.3*  --    Recent Labs    01/11/24 1243 01/12/24 2049 01/18/24 0506 03/15/24 1516 06/20/24 0905  WBC 5.6   < > 3.9* 3.8* 4.3  NEUTROABS 4.1  --   --   --  2,662  HGB 15.0   < > 13.8 15.0 13.8  HCT 44.7   < > 41.9 45.6 42.4  MCV 99.8   < > 100.0 100.4* 100.0  PLT 136*   < > 151 135* 152   < > = values in this interval not displayed.   Lab Results  Component Value Date   TSH 3.10 06/20/2024   Lab Results  Component Value Date   HGBA1C 6.2 (H) 06/20/2024   Lab Results  Component Value Date   CHOL 180  06/20/2024   HDL 66 06/20/2024   LDLCALC 95 06/20/2024   TRIG 93 06/20/2024   CHOLHDL 2.7 06/20/2024    Significant Diagnostic Results in last 30 days:  No results found.  Assessment/Plan 1. Paroxysmal atrial fibrillation (HCC) (Primary) - followed by cardiology - HR< 100 with carvedilol  - not on DOAC - TSH 3.10 06/20/2024  2. Chronic heart failure with preserved ejection fraction (HFpEF) (HCC) - LVEF 55-60% 04/2023 - BNP 876.8 01/2024> last hospitalization - lung sounds clear, weight stable, no edema - cont furosemide    3. Fatigue, unspecified type - TSH normal  - no blood loss or sign of anemia - ? Caloric intake> weight down 4 lbs in 2 months  - recommend increasing calories or starting meal supplement shake daily   4. Mild dementia with anxiety, unspecified dementia type [facility]) - SLUMS 17/30 01/2024 - CT head noted chronic microvascular ischemic changes  - increased anxiety per husband  - will trial Zoloft  - bmp in 2-3 weeks after  starting medication  - sertraline (ZOLOFT) 25 MG tablet; Take 1 tablet (25 mg total) by mouth daily.  Dispense: 30 tablet; Refill: 3 - Basic Metabolic Panel with eGFR  6. Emphysema lung (HCC) - followed by pulmonary - no recent exacerbations - cont anoro ellipta  and albuterol  prn    Family/ staff Communication: plan discussed with patient and nurse  Labs/tests ordered:  bmp 11/17

## 2024-08-08 DIAGNOSIS — J9611 Chronic respiratory failure with hypoxia: Secondary | ICD-10-CM | POA: Diagnosis not present

## 2024-08-09 ENCOUNTER — Ambulatory Visit: Payer: Self-pay | Admitting: Nurse Practitioner

## 2024-08-29 ENCOUNTER — Emergency Department

## 2024-08-29 ENCOUNTER — Other Ambulatory Visit: Payer: Self-pay

## 2024-08-29 ENCOUNTER — Observation Stay
Admission: EM | Admit: 2024-08-29 | Discharge: 2024-09-03 | Disposition: A | Discharge status: Home / Self Care | Attending: Osteopathic Medicine | Admitting: Osteopathic Medicine

## 2024-08-29 DIAGNOSIS — Z79899 Other long term (current) drug therapy: Secondary | ICD-10-CM | POA: Diagnosis not present

## 2024-08-29 DIAGNOSIS — R0902 Hypoxemia: Secondary | ICD-10-CM | POA: Diagnosis not present

## 2024-08-29 DIAGNOSIS — I509 Heart failure, unspecified: Secondary | ICD-10-CM | POA: Diagnosis not present

## 2024-08-29 DIAGNOSIS — I5032 Chronic diastolic (congestive) heart failure: Secondary | ICD-10-CM | POA: Insufficient documentation

## 2024-08-29 DIAGNOSIS — J9611 Chronic respiratory failure with hypoxia: Secondary | ICD-10-CM | POA: Diagnosis present

## 2024-08-29 DIAGNOSIS — R0689 Other abnormalities of breathing: Secondary | ICD-10-CM | POA: Diagnosis not present

## 2024-08-29 DIAGNOSIS — R0602 Shortness of breath: Secondary | ICD-10-CM | POA: Diagnosis not present

## 2024-08-29 DIAGNOSIS — I700 Atherosclerosis of aorta: Secondary | ICD-10-CM | POA: Diagnosis not present

## 2024-08-29 DIAGNOSIS — N1831 Chronic kidney disease, stage 3a: Secondary | ICD-10-CM | POA: Diagnosis not present

## 2024-08-29 DIAGNOSIS — I130 Hypertensive heart and chronic kidney disease with heart failure and stage 1 through stage 4 chronic kidney disease, or unspecified chronic kidney disease: Secondary | ICD-10-CM | POA: Insufficient documentation

## 2024-08-29 DIAGNOSIS — I5033 Acute on chronic diastolic (congestive) heart failure: Secondary | ICD-10-CM | POA: Diagnosis not present

## 2024-08-29 DIAGNOSIS — E785 Hyperlipidemia, unspecified: Secondary | ICD-10-CM | POA: Diagnosis not present

## 2024-08-29 DIAGNOSIS — J9621 Acute and chronic respiratory failure with hypoxia: Principal | ICD-10-CM | POA: Insufficient documentation

## 2024-08-29 DIAGNOSIS — J449 Chronic obstructive pulmonary disease, unspecified: Secondary | ICD-10-CM | POA: Insufficient documentation

## 2024-08-29 DIAGNOSIS — Z7982 Long term (current) use of aspirin: Secondary | ICD-10-CM | POA: Diagnosis not present

## 2024-08-29 DIAGNOSIS — F0390 Unspecified dementia without behavioral disturbance: Secondary | ICD-10-CM | POA: Diagnosis not present

## 2024-08-29 DIAGNOSIS — I110 Hypertensive heart disease with heart failure: Secondary | ICD-10-CM | POA: Diagnosis not present

## 2024-08-29 DIAGNOSIS — I517 Cardiomegaly: Secondary | ICD-10-CM | POA: Diagnosis not present

## 2024-08-29 LAB — RESP PANEL BY RT-PCR (RSV, FLU A&B, COVID)  RVPGX2
Influenza A by PCR: NEGATIVE
Influenza B by PCR: NEGATIVE
Resp Syncytial Virus by PCR: NEGATIVE
SARS Coronavirus 2 by RT PCR: NEGATIVE

## 2024-08-29 LAB — CBC WITH DIFFERENTIAL/PLATELET
Abs Immature Granulocytes: 0.03 K/uL (ref 0.00–0.07)
Basophils Absolute: 0 K/uL (ref 0.0–0.1)
Basophils Relative: 0 %
Eosinophils Absolute: 0 K/uL (ref 0.0–0.5)
Eosinophils Relative: 0 %
HCT: 41.1 % (ref 36.0–46.0)
Hemoglobin: 13.6 g/dL (ref 12.0–15.0)
Immature Granulocytes: 0 %
Lymphocytes Relative: 7 %
Lymphs Abs: 0.6 K/uL — ABNORMAL LOW (ref 0.7–4.0)
MCH: 33.2 pg (ref 26.0–34.0)
MCHC: 33.1 g/dL (ref 30.0–36.0)
MCV: 100.2 fL — ABNORMAL HIGH (ref 80.0–100.0)
Monocytes Absolute: 0.8 K/uL (ref 0.1–1.0)
Monocytes Relative: 10 %
Neutro Abs: 6.6 K/uL (ref 1.7–7.7)
Neutrophils Relative %: 83 %
Platelets: 114 K/uL — ABNORMAL LOW (ref 150–400)
RBC: 4.1 MIL/uL (ref 3.87–5.11)
RDW: 13.3 % (ref 11.5–15.5)
WBC: 8 K/uL (ref 4.0–10.5)
nRBC: 0 % (ref 0.0–0.2)

## 2024-08-29 LAB — TROPONIN T, HIGH SENSITIVITY
Troponin T High Sensitivity: 46 ng/L — ABNORMAL HIGH (ref 0–19)
Troponin T High Sensitivity: 55 ng/L — ABNORMAL HIGH (ref 0–19)

## 2024-08-29 LAB — RESPIRATORY PANEL BY PCR

## 2024-08-29 LAB — COMPREHENSIVE METABOLIC PANEL WITH GFR
ALT: 11 U/L (ref 0–44)
AST: 28 U/L (ref 15–41)
Albumin: 3.4 g/dL — ABNORMAL LOW (ref 3.5–5.0)
Alkaline Phosphatase: 97 U/L (ref 38–126)
Anion gap: 8 (ref 5–15)
BUN: 18 mg/dL (ref 8–23)
CO2: 28 mmol/L (ref 22–32)
Calcium: 10.2 mg/dL (ref 8.9–10.3)
Chloride: 101 mmol/L (ref 98–111)
Creatinine, Ser: 0.75 mg/dL (ref 0.44–1.00)
GFR, Estimated: >60 mL/min (ref >60)
Glucose, Bld: 114 mg/dL — ABNORMAL HIGH (ref 70–99)
Potassium: 4.5 mmol/L (ref 3.5–5.1)
Sodium: 137 mmol/L (ref 135–145)
Total Bilirubin: 0.9 mg/dL (ref 0.0–1.2)
Total Protein: 6.7 g/dL (ref 6.5–8.1)

## 2024-08-29 LAB — URINALYSIS, W/ REFLEX TO CULTURE (INFECTION SUSPECTED)
Bacteria, UA: NONE SEEN
Bilirubin Urine: NEGATIVE
Glucose, UA: NEGATIVE mg/dL
Hgb urine dipstick: NEGATIVE
Ketones, ur: NEGATIVE mg/dL
Leukocytes,Ua: NEGATIVE
Nitrite: NEGATIVE
Protein, ur: NEGATIVE mg/dL
Specific Gravity, Urine: 1.009 (ref 1.005–1.030)
Squamous Epithelial / HPF: 0 /HPF (ref 0–5)
pH: 7 (ref 5.0–8.0)

## 2024-08-29 LAB — LACTIC ACID, PLASMA: Lactic Acid, Venous: 1.1 mmol/L (ref 0.5–1.9)

## 2024-08-29 LAB — PRO BRAIN NATRIURETIC PEPTIDE: Pro Brain Natriuretic Peptide: 3179 pg/mL — ABNORMAL HIGH (ref <300.0)

## 2024-08-29 MED ORDER — ACETAMINOPHEN 325 MG PO TABS
650.0000 mg | ORAL_TABLET | Freq: Four times a day (QID) | ORAL | Status: DC | PRN
  Administered 2024-09-01 – 2024-09-03 (×3): 650 mg via ORAL
  Filled 2024-08-29 (×2): qty 2

## 2024-08-29 MED ORDER — ATORVASTATIN CALCIUM 20 MG PO TABS
20.0000 mg | ORAL_TABLET | Freq: Every day | ORAL | Status: DC
  Administered 2024-08-30 – 2024-09-03 (×5): 20 mg via ORAL
  Filled 2024-08-29 (×5): qty 1

## 2024-08-29 MED ORDER — ASPIRIN 81 MG PO TBEC
81.0000 mg | DELAYED_RELEASE_TABLET | Freq: Every day | ORAL | Status: DC
  Administered 2024-08-30 – 2024-09-03 (×5): 81 mg via ORAL
  Filled 2024-08-29 (×5): qty 1

## 2024-08-29 MED ORDER — ACETAMINOPHEN 650 MG RE SUPP: 650.0000 mg | Freq: Four times a day (QID) | RECTAL | Status: DC | PRN

## 2024-08-29 MED ORDER — ENSURE PLUS HIGH PROTEIN PO LIQD
237.0000 mL | Freq: Two times a day (BID) | ORAL | Status: DC
  Administered 2024-08-30 – 2024-09-02 (×7): 237 mL via ORAL

## 2024-08-29 MED ORDER — CARVEDILOL 6.25 MG PO TABS
6.2500 mg | ORAL_TABLET | Freq: Two times a day (BID) | ORAL | Status: DC
  Administered 2024-08-29 – 2024-09-03 (×10): 6.25 mg via ORAL
  Filled 2024-08-29 (×10): qty 1

## 2024-08-29 MED ORDER — POLYETHYLENE GLYCOL 3350 17 G PO PACK
17.0000 g | PACK | Freq: Every day | ORAL | Status: DC
  Administered 2024-08-30 – 2024-09-01 (×3): 17 g via ORAL
  Filled 2024-08-29 (×4): qty 1

## 2024-08-29 MED ORDER — ONDANSETRON HCL 4 MG/2ML IJ SOLN: 4.0000 mg | Freq: Four times a day (QID) | INTRAMUSCULAR | Status: DC | PRN

## 2024-08-29 MED ORDER — BISACODYL 5 MG PO TBEC: 5.0000 mg | DELAYED_RELEASE_TABLET | Freq: Every day | ORAL | Status: DC | PRN

## 2024-08-29 MED ORDER — ENOXAPARIN SODIUM 40 MG/0.4ML IJ SOSY
40.0000 mg | PREFILLED_SYRINGE | INTRAMUSCULAR | Status: DC
  Administered 2024-08-29 – 2024-09-02 (×5): 40 mg via SUBCUTANEOUS
  Filled 2024-08-29 (×5): qty 0.4

## 2024-08-29 MED ORDER — IPRATROPIUM-ALBUTEROL 0.5-2.5 (3) MG/3ML IN SOLN
3.0000 mL | Freq: Four times a day (QID) | RESPIRATORY_TRACT | Status: DC | PRN
Start: 2024-08-29 — End: 2024-09-03

## 2024-08-29 MED ORDER — FUROSEMIDE 10 MG/ML IJ SOLN
40.0000 mg | Freq: Two times a day (BID) | INTRAMUSCULAR | Status: DC
  Administered 2024-08-29 – 2024-08-30 (×2): 40 mg via INTRAVENOUS
  Filled 2024-08-29 (×2): qty 4

## 2024-08-29 MED ORDER — FUROSEMIDE 10 MG/ML IJ SOLN
40.0000 mg | Freq: Once | INTRAMUSCULAR | Status: AC
  Administered 2024-08-29: 40 mg via INTRAVENOUS
  Filled 2024-08-29: qty 4

## 2024-08-29 MED ORDER — ONDANSETRON HCL 4 MG PO TABS: 4.0000 mg | ORAL_TABLET | Freq: Four times a day (QID) | ORAL | Status: DC | PRN

## 2024-08-29 MED ORDER — ALBUTEROL SULFATE (2.5 MG/3ML) 0.083% IN NEBU: 2.5000 mg | INHALATION_SOLUTION | RESPIRATORY_TRACT | Status: DC | PRN

## 2024-08-29 MED ORDER — UMECLIDINIUM-VILANTEROL 62.5-25 MCG/ACT IN AEPB
1.0000 | INHALATION_SPRAY | Freq: Every day | RESPIRATORY_TRACT | Status: DC
  Administered 2024-08-30 – 2024-09-03 (×5): 1 via RESPIRATORY_TRACT
  Filled 2024-08-29: qty 14

## 2024-08-29 NOTE — ED Provider Notes (Signed)
 [facility] Provider Note    Event Date/Time   First MD Initiated Contact with Patient 08/29/24 1121     (approximate)   History   Shortness of Breath   HPI  Tiffany Caldwell is a 83 y.o. female with history of CAD s/p PCI and CABG, hypertension, hyperlipidemia, HFpEF, COPD, breast cancer, GERD, and IBS who presents with generalized weakness and flulike symptoms, acute onset today, associated with shortness of breath and hypoxia.  Per EMS, the patient's O2 saturation on room air was 90% so they put her on 4 L O2 by nasal cannula.  The patient also had a fever last night.  She denies vomiting or diarrhea.  She has no chest pain.  She denies any cough.  I reviewed the past medical records for the patient's most recent outpatient encounter was with cardiology on 9/9 for a follow-up visit for her hypertension.   Physical Exam   Triage Vital Signs: ED Triage Vitals  Encounter Vitals Group     BP 08/29/24 1125 (!) 163/75     Girls Systolic BP Percentile --      Girls Diastolic BP Percentile --      Boys Systolic BP Percentile --      Boys Diastolic BP Percentile --      Pulse Rate 08/29/24 1125 67     Resp 08/29/24 1125 (!) 34     Temp 08/29/24 1125 98.8 F (37.1 C)     Temp Source 08/29/24 1125 Oral     SpO2 08/29/24 1125 96 %     Weight 08/29/24 1122 125 lb (56.7 kg)     Height 08/29/24 1122 5' 5 (1.651 m)     Head Circumference --      Peak Flow --      Pain Score --      Pain Loc --      Pain Education --      Exclude from Growth Chart --     Most recent vital signs: Vitals:   08/29/24 1125 08/29/24 1200  BP: (!) 163/75 (!) 165/73  Pulse: 67 (!) 56  Resp: (!) 34 (!) 24  Temp: 98.8 F (37.1 C)   SpO2: 96% 100%     General: Alert and oriented, no distress.  CV:  Good peripheral perfusion.  Resp:  Increased effort.  Diminished breath sounds bilaterally with no significant wheezes or rales. Abd:  No distention.  Other:  Trace  bilateral lower extremity edema.   ED Results / Procedures / Treatments   Labs (all labs ordered are listed, but only abnormal results are displayed) Labs Reviewed  CBC WITH DIFFERENTIAL/PLATELET - Abnormal; Notable for the following components:      Result Value   MCV 100.2 (*)    Platelets 114 (*)    Lymphs Abs 0.6 (*)    All other components within normal limits  PRO BRAIN NATRIURETIC PEPTIDE - Abnormal; Notable for the following components:   Pro Brain Natriuretic Peptide 3,179.0 (*)    All other components within normal limits  COMPREHENSIVE METABOLIC PANEL WITH GFR - Abnormal; Notable for the following components:   Glucose, Bld 114 (*)    Albumin 3.4 (*)    All other components within normal limits  TROPONIN T, HIGH SENSITIVITY - Abnormal; Notable for the following components:   Troponin T High Sensitivity 46 (*)    All other components within normal limits  RESP PANEL BY RT-PCR (RSV, FLU A&B, COVID)  RVPGX2  RESPIRATORY PANEL BY PCR  LACTIC ACID, PLASMA  URINALYSIS, W/ REFLEX TO CULTURE (INFECTION SUSPECTED)  TROPONIN T, HIGH SENSITIVITY     EKG  ED ECG REPORT I, Waylon Cassis, the attending physician, personally viewed and interpreted this ECG.  Date: 08/29/2024 EKG Time: 1134 Rate: 64 Rhythm: normal sinus rhythm machine read is undetermined rhythm due to poor quality baseline) QRS Axis: Left axis Intervals: RBBB ST/T Wave abnormalities: LVH, nonspecific ST abnormalities Narrative Interpretation: Nonspecific abnormalities with no evidence of acute ischemia    RADIOLOGY  Chest x-ray: I independently viewed and interpreted the images; there are some lower lung interstitial opacities, possible edema  PROCEDURES:  Critical Care performed: No  Procedures   MEDICATIONS ORDERED IN ED: Medications  acetaminophen  (TYLENOL ) tablet 650 mg (has no administration in time range)    Or  acetaminophen  (TYLENOL ) suppository 650 mg (has no administration in  time range)  polyethylene glycol (MIRALAX  / GLYCOLAX ) packet 17 g (has no administration in time range)  bisacodyl  (DULCOLAX) EC tablet 5 mg (has no administration in time range)  ondansetron  (ZOFRAN ) tablet 4 mg (has no administration in time range)    Or  ondansetron  (ZOFRAN ) injection 4 mg (has no administration in time range)  albuterol  (PROVENTIL ) (2.5 MG/3ML) 0.083% nebulizer solution 2.5 mg (has no administration in time range)  enoxaparin  (LOVENOX ) injection 40 mg (40 mg Subcutaneous Given 08/29/24 1448)  furosemide  (LASIX ) injection 40 mg (40 mg Intravenous Given 08/29/24 1419)     IMPRESSION / MDM / ASSESSMENT AND PLAN / ED COURSE  I reviewed the triage vital signs and the nursing notes.  83 year old female with PMH as noted above presents with acute onset of generalized weakness, flulike symptoms, and shortness of breath with borderline hypoxia.  On exam the patient demonstrates somewhat increased work of breathing but no acute respiratory distress.  Other vital signs are normal except for hypertension.  O2 saturation is in the high 90s on 4 L O2 by nasal cannula.  The patient has somewhat diminished breath sounds bilaterally.  Differential diagnosis includes, but is not limited to, acute bronchitis, viral syndrome, pneumonia, CHF exacerbation, other cardiac etiology.  I have a low suspicion for PE.  We will obtain chest x-ray, lab workup, and reassess.  Patient's presentation is most consistent with acute presentation with potential threat to life or bodily function.  The patient is on the cardiac monitor to evaluate for evidence of arrhythmia and/or significant heart rate changes.    ----------------------------------------- 2:37 PM on 08/29/2024 -----------------------------------------  X-ray shows bilateral interstitial opacities consistent with CHF.  CMP and CBC are unremarkable.  Lactate is normal.  Respiratory panel is negative.  BNP and troponin are elevated.   Presentation is most consistent with CHF exacerbation.  Given the new oxygen  requirement, the patient will need inpatient management.  I have ordered IV Lasix .  I consulted Dr. Lenon from the hospitalist service; based on our discussion she agrees to evaluate the patient for admission.  FINAL CLINICAL IMPRESSION(S) / ED DIAGNOSES   Final diagnoses:  Acute on chronic congestive heart failure, unspecified heart failure type (HCC)     Rx / DC Orders   ED Discharge Orders     None        Note:  This document was prepared using Dragon voice recognition software and may include unintentional dictation errors.    Cassis Waylon, MD 08/29/24 [phone]

## 2024-08-29 NOTE — H&P (Signed)
 History and Physical  Tiffany Caldwell FMW:[id number] DOB: 10-09-1940 DOA: 08/29/2024 PCP: Gil Greig BRAVO, NP  Chief Complaint: SOB Historian: patient  HPI:  Tiffany Caldwell is a 83 y.o. female with a PMH significant for hypertension, hyperlipidemia, COPD, CAD s/p PCI and CABG , diastolic CHF, CKD-3A, thrombocytopenia, pulmonary MAI, IBS, gallstone, PVC/PAC, breast cancer. At baseline, they live at [facility] and are partially independent with ADLs.  They presented from SNF to the ED on 08/29/2024 with SOB x 2 days. She states that she has felt bad since yesterday like the flu but has a hard time committing to specific symptoms. She endorses being tired but no muscle soreness. Denies any cough. Admits to chronic SOB and thinks it may have been a little worse since yesterday. She thinks she had a fever last night but is unsure what the temperature was. Denies rhinorrhea, nausea, vomiting, chest pain. She was put on 4Lnc at her facility for O2 saturations at 90% ORA.  At baseline, she wears 2-3L of oxygen  at night.   In the ED, it was found that they had stable vital signs and was originally on 4Lnc but had been reduced down to 2L while we talked and her O2 saturations remained 99%-100%.  Significant findings included: LA 1.1, WBC 8.0, hemoglobin 13.6, platelets 114. Negative COVID, flu, RSV. Troponin 46, repeat pending.  proBNP 3179.  Metabolic panel unremarkable. Chest x-ray: Moderate cardiomegaly and interstitial edema  They were initially treated with Lasix .   Patient was admitted to medicine service for further workup and management of hypoxia as outlined in detail below.  Assessment/Plan Principal Problem:   Hypoxia   Acute on chronic hypoxic respiratory failure COPD-  baseline 2-3L nasal cannula at bedtime only.  Per report, required 4 L nasal cannula at her facility to maintain her saturations and had a fever last night.  She is afebrile here.  RVP ordered to rule out common viral  etiology.  Flu, COVID, RSV negative. Suspect mild CHF exacerbation versus COPD exacerbation.  Minimal wheezing heard on lung exam.  Nonpitting edema of lower extremities. - Wean oxygen  as tolerated - Follow-up RVP - Continue IV Lasix  with strict I's/O and daily weights - Breathing treatments scheduled and as needed - Monitor temperature, Tylenol  as needed  History of pulmonary Mycobacterium avium infection [facility]):    Chronic diastolic CHF: 2D echo on 05/12/2023 showed EF of 55 to 60% with grade 2 diastolic dysfunction.  Cardiac MRI 02/2024 showed overall unremarkable findings with LVEF 67%.  Patient has elevated proBNP 3179, but no leg edema.  Takes 20 mg Lasix  on weekdays chronically. -Watch volume status closely - Continue IV Lasix  40 mg   Coronary artery disease and myocardial injury: Troponin minimally elevated 46.  Likely due to demand ischemia. - Continue home aspirin , Lipitor, Coreg  - Repeat troponin pending   Essential hypertension - Continue home Coreg    Dyslipidemia - Continue home Lipitor   Chronic kidney disease, stage 3a (HCC): Renal function stable. -Follow-up with BMP  Past Medical History:  Diagnosis Date   Allergy    Arthritis    Breast cancer (HCC)    left breast   Coronary artery disease 2001   CABG   DDD (degenerative disc disease), lumbosacral    Dyspnea    if rushing or climmbing lots of stairs   Emphysema lung (HCC)    emphysema   GERD (gastroesophageal reflux disease)    11/20/16- no a current problem   Hyperlipidemia  Hypertension    Irritable bowel syndrome (IBS)    Lung disorder    leison   Neuropathy    Pinched nerve    back and left leg   Pneumonia 2016    Past Surgical History:  Procedure Laterality Date   BREAST FIBROADENOMA SURGERY  01/13/1980   BREAST SURGERY  02/01/2004   tram/mastectomyfor recurrence   CARPOMETACARPEL SUSPENSION PLASTY Right 12/17/2017   Procedure: SUSPENSIONPLASTY RIGHT THUMB WITH EXCISION TRAPEZIUM;  Surgeon:  Murrell Kuba, MD;  Location: [facility];  Service: Orthopedics;  Laterality: Right;   COLONOSCOPY     CORONARY ANGIOPLASTY  2001   had tear to two vessels- had to have open heart surgery   CORONARY ARTERY BYPASS GRAFT  2001   EYE SURGERY Bilateral    Cataract   KNEE ARTHROSCOPY  January 2011   right   MASTECTOMY PARTIAL / LUMPECTOMY W/ AXILLARY LYMPHADENECTOMY  04/10/1989   Dr Neysa / left breast   PARTIAL HYSTERECTOMY     vaginal   RIGHT/LEFT HEART CATH AND CORONARY ANGIOGRAPHY Bilateral 06/17/2023   Procedure: RIGHT/LEFT HEART CATH AND CORONARY ANGIOGRAPHY;  Surgeon: Mady Bruckner, MD;  Location: [facility];  Service: Cardiovascular;  Laterality: Bilateral;   TENDON TRANSFER Right 12/17/2017   Procedure: ABDUCTOR POLLICIS LONGUS TRANSFER;  Surgeon: Murrell Kuba, MD;  Location: [facility];  Service: Orthopedics;  Laterality: Right;   TONSILLECTOMY AND ADENOIDECTOMY     TOTAL HIP ARTHROPLASTY Right 08/05/2016   Procedure: TOTAL HIP ARTHROPLASTY ANTERIOR APPROACH;  Surgeon: Maude Herald, MD;  Location: [facility];  Service: Orthopedics;  Laterality: Right;   UPPER GASTROINTESTINAL ENDOSCOPY     VIDEO BRONCHOSCOPY WITH ENDOBRONCHIAL NAVIGATION N/A 11/27/2016   Procedure: VIDEO BRONCHOSCOPY WITH ENDOBRONCHIAL NAVIGATION;  Surgeon: Elspeth JAYSON Millers, MD;  Location: [facility];  Service: Thoracic;  Laterality: N/A;     reports that she quit smoking about 35 years ago. Her smoking use included cigarettes. She started smoking about 65 years ago. She has never used smokeless tobacco. She reports current alcohol use of about 1.0 standard drink of alcohol per week. She reports that she does not use drugs.  Allergies  Allergen Reactions   Amlodipine  Swelling    Leg swelling and fatigue.  Other Reaction(s): edema/fatigue   Amoxicillin Other (See Comments)    No reaction. Just doesn't work anymore  Other Reaction(s): not effetive-took so many times    Hydromorphone  Other (See Comments)    ALTERED MENTAL STATUS  Altered mental status   Morphine  Other (See Comments)    Pt and family report altered mental status.   Azithromycin Rash   Ciprofloxacin Nausea And Vomiting and Nausea Only   Hydromorphone  Hcl Other (See Comments)    Altered mental status Altered mental status   Levofloxacin  Nausea Only and Other (See Comments)   Lisinopril Other (See Comments)    Fatigue  Other Reaction(s): fatigue    Family History  Problem Relation Age of Onset   Heart disease Mother        deceased   Emphysema Father        deceased   Colon cancer Son    Colon cancer Paternal Uncle    Colon cancer Paternal Grandmother     Prior to Admission medications   Medication Sig Start Date End Date Taking? Authorizing Provider  Acetaminophen  Extra Strength 500 MG TABS TAKE 2 TABLETS =1000MG  BY MOUTH TWICE DAILY *DO NOT EXCEED 4GM OF TYLENOL  IN 24 HOURS* (PATIENT REQUEST CAPLET)  08/03/24   Gil Greig BRAVO, NP  albuterol  (VENTOLIN  HFA) 108 (90 Base) MCG/ACT inhaler Inhale 1-2 puffs into the lungs every 6 (six) hours as needed for wheezing or shortness of breath. prn 01/18/24   Abdul Fine, MD  aspirin  EC 81 MG tablet Take 81 mg by mouth daily. Swallow whole.    [provider]  atorvastatin  (LIPITOR) 20 MG tablet Take 1 tablet (20 mg total) by mouth daily. 03/13/23   End, Lonni, MD  Biotin 89999 MCG TABS Take 1 tablet by mouth daily.    [provider]  carvedilol  (COREG ) 6.25 MG tablet Take 1 tablet (6.25 mg total) by mouth 2 (two) times daily with a meal. 04/05/24   Furth, Cadence H, PA-C  famotidine (PEPCID) 20 MG tablet Take 20 mg by mouth daily as needed for heartburn or indigestion.    [provider]  fexofenadine (ALLEGRA) 180 MG tablet Take 180 mg by mouth daily.    [provider]  fish oil-omega-3 fatty acids 1000 MG capsule Take 1 g by mouth daily.     [provider]  furosemide  (LASIX ) 20 MG  tablet Take 1 tablet by mouth every Mon, Tues, Wed, Thurs, Fri 04/25/24   Furth, Cadence H, PA-C  gabapentin  (NEURONTIN ) 300 MG capsule TAKE 1 CAPSULE BY MOUTH TWICE DAILY (MORNING AND EVENING) FOR SCIATICA *DO NOT CRUSH OR CHEW* 08/03/24   Fargo, Amy E, NP  Glucosamine-Chondroit-Vit C-Mn (GLUCOSAMINE CHONDR 1500 COMPLX PO) Take 1 tablet by mouth once.    [provider]  Hyoscyamine  Sulfate (HYOSCYAMINE  PO) Take 0.375 mg by mouth every 12 (twelve) hours as needed (for stomach pain or spasms).    Eubanks, Jessica K, NP  mexiletine (MEXITIL ) 150 MG capsule Take 1 capsule (150 mg total) by mouth 2 (two) times daily. 07/28/24   Kennyth Chew, MD  Multiple Vitamin (MULTIVITAMIN) tablet Take 1 tablet by mouth daily.      [provider]  nitroGLYCERIN  (NITROSTAT ) 0.4 MG SL tablet Place 0.4 mg under the tongue every 5 (five) minutes as needed for chest pain.    [provider]  potassium chloride  (KLOR-CON  M) 10 MEQ tablet TAKE 1 TABLET BY MOUTH EVERY OTHER DAY *DO NOT CRUSH OR CHEW* *TAKE WITH FOOD* 08/03/24   Fargo, Amy E, NP  sertraline  (ZOLOFT ) 25 MG tablet Take 1 tablet (25 mg total) by mouth daily. 08/03/24   Fargo, Amy E, NP  umeclidinium-vilanterol (ANORO ELLIPTA ) 62.5-25 MCG/ACT AEPB INHALE 1 PUFF INTO THE LUNGS BY MOUTH ONCE DAILY 11/04/23   Shelah Lamar RAMAN, MD   I have personally, briefly reviewed patient's prior medical records in [place] [facility]  Objective: Blood pressure (!) 165/73, pulse (!) 56, temperature 98.8 F (37.1 C), temperature source Oral, resp. rate (!) 24, height 5' 5 (1.651 m), weight 56.7 kg, SpO2 100%.   Constitutional: NAD, calm, comfortable HEENT: lids and conjunctivae normal. MMM. Posterior pharynx clear of any exudate or lesions. Normal dentition.  Respiratory:mild scattered crackles, decreased airflow bilateral bases. Normal respiratory effort. No accessory muscle use.  Cardiovascular: RRR, no murmurs / rubs / gallops. No extremity edema.  2+ pedal pulses. no clubbing / cyanosis. Abdomen: soft, NT, ND, no masses or HSM palpated. Musculoskeletal: No joint deformity upper and lower extremities. Normal muscle tone.  Skin: dry, intact, normal color, normal temperature on exposed skin Neurologic: Alert and oriented x 3. Normal speech. Grossly non-focal exam. PERRL Psychiatric: Normal mood. Congruent affect.  Labs on Admission: I have personally reviewed  admission labs and imaging studies  CBC    Component Value Date/Time   WBC 8.0 08/29/2024 1127   RBC 4.10 08/29/2024 1127   HGB 13.6 08/29/2024 1127   HGB 13.9 07/07/2023 1603   HCT 41.1 08/29/2024 1127   HCT 42.3 07/07/2023 1603   PLT 114 (L) 08/29/2024 1127   PLT 121 (L) 06/09/2023 1600   MCV 100.2 (H) 08/29/2024 1127   MCV 97 06/09/2023 1600   MCH 33.2 08/29/2024 1127   MCHC 33.1 08/29/2024 1127   RDW 13.3 08/29/2024 1127   RDW 12.3 06/09/2023 1600   LYMPHSABS 0.6 (L) 08/29/2024 1127   MONOABS 0.8 08/29/2024 1127   EOSABS 0.0 08/29/2024 1127   BASOSABS 0.0 08/29/2024 1127   CMP     Component Value Date/Time   NA 137 08/29/2024 1311   NA 143 05/02/2024 1442   K 4.5 08/29/2024 1311   CL 101 08/29/2024 1311   CO2 28 08/29/2024 1311   GLUCOSE 114 (H) 08/29/2024 1311   BUN 18 08/29/2024 1311   BUN 26 05/02/2024 1442   CREATININE 0.75 08/29/2024 1311   CREATININE 0.71 06/20/2024 0905   CALCIUM  10.2 08/29/2024 1311   PROT 6.7 08/29/2024 1311   ALBUMIN 3.4 (L) 08/29/2024 1311   AST 28 08/29/2024 1311   ALT 11 08/29/2024 1311   ALKPHOS 97 08/29/2024 1311   BILITOT 0.9 08/29/2024 1311   GFRNONAA >60 08/29/2024 1311   GFRAA 58 (L) 08/04/2019 0011    Radiological Exams on Admission: DG Chest 2 View Result Date: 08/29/2024 EXAM: 2 VIEW(S) XRAY OF THE CHEST 08/29/2024 11:54:45 AM COMPARISON: 03/15/2024 CLINICAL HISTORY: SOB (shortness of breath) FINDINGS: LUNGS AND PLEURA: The chin overlies the apices on the frontal radiograph. Lower lung predominant  interstitial prominence and indistinctness. No lobar consolidation. No pleural effusion. No pneumothorax. HEART AND MEDIASTINUM: Moderate cardiomegaly. Transverse aortic atherosclerosis. BONES AND SOFT TISSUES: Median sternotomy for CABG. Left axillary node dissection. No acute osseous abnormality. IMPRESSION: 1. Cardiomegaly, moderate, without lobar consolidation. 2. Lower lung predominant interstitial prominence and indistinctness, likely interstitial edema. Electronically signed by: Rockey Kilts MD 08/29/2024 12:22 PM EST RP Workstation: [id number]   EKG: Independently reviewed.  Needs to be repeated.  Not a readable ECG  DVT prophylaxis: enoxaparin  (LOVENOX ) injection 40 mg Start: 08/29/24 1445 SCDs Start: 08/29/24 1434  Code Status: Full Family Communication: None at bedside Disposition Plan: Admit for observation Consults called: None  Marien LITTIE Piety, DO [facility]  08/29/2024, 4:17 PM    To contact the appropriate [facility] Attending or Consulting provider: Check [email or web address] for coverage from 7pm-7am

## 2024-08-29 NOTE — ED Triage Notes (Signed)
 Pt arrived via EMS from [facility] [place]. Pt arrived due to SOB. Ems sts that pt RA sat was 90 and EMS applied 4l/min and brought pt up to 98%. Per EMS pt home health staff advised that pt had a fever last night but then went away.

## 2024-08-30 DIAGNOSIS — R0902 Hypoxemia: Secondary | ICD-10-CM | POA: Diagnosis not present

## 2024-08-30 LAB — BASIC METABOLIC PANEL WITH GFR
Anion gap: 10 (ref 5–15)
BUN: 19 mg/dL (ref 8–23)
CO2: 30 mmol/L (ref 22–32)
Calcium: 10.2 mg/dL (ref 8.9–10.3)
Chloride: 94 mmol/L — ABNORMAL LOW (ref 98–111)
Creatinine, Ser: 0.91 mg/dL (ref 0.44–1.00)
GFR, Estimated: >60 mL/min (ref >60)
Glucose, Bld: 108 mg/dL — ABNORMAL HIGH (ref 70–99)
Potassium: 3.5 mmol/L (ref 3.5–5.1)
Sodium: 135 mmol/L (ref 135–145)

## 2024-08-30 MED ORDER — FUROSEMIDE 20 MG PO TABS
20.0000 mg | ORAL_TABLET | Freq: Every day | ORAL | Status: DC
  Administered 2024-08-31 – 2024-09-01 (×2): 20 mg via ORAL
  Filled 2024-08-30 (×2): qty 1

## 2024-08-30 NOTE — Plan of Care (Signed)

## 2024-08-30 NOTE — Care Management Obs Status (Signed)
 MEDICARE OBSERVATION STATUS NOTIFICATION   Patient Details  Name: Tiffany Caldwell MRN: [id number] Date of Birth: 05-Mar-1941   Medicare Observation Status Notification Given:  Chaney BRANDY CHRISTIANE LELON, CMA 08/30/2024, 2:23 PM

## 2024-08-30 NOTE — Progress Notes (Signed)
 PT Cancellation Note  Patient Details Name: Tiffany Caldwell MRN: [id number] DOB: September 20, 1941   Cancelled Treatment:    Reason Eval/Treat Not Completed: Other (comment) (Patient refusing due to not feeling well. She declined sitting up for lunch that had just arrived. She has been OOB with staff assistance and family member in the room reports this is not her baseline. PT will follow up another time.)  Tiffany Caldwell, PT, MPT  Tiffany Caldwell 08/30/2024, 1:27 PM

## 2024-08-30 NOTE — Progress Notes (Signed)
 Progress Note    Tiffany Caldwell  FMW:[id number] DOB: [birth year]  DOA: 08/29/2024 PCP: Gil Greig BRAVO, NP      Brief Narrative:    Medical records reviewed and are as summarized below:  Tiffany Caldwell is a 83 y.o. female  with PMH significant for hypertension, hyperlipidemia, COPD, CAD s/p PCI and CABG , diastolic CHF, CKD-3A, thrombocytopenia, pulmonary MAI, IBS, gallstone, PVC/PAC, breast cancer.  She lives at [facility].  She presented to the ED because of shortness of breath and easy fatigability for about 2 days duration.  She endorsed that she has chronic shortness of breath at baseline but this has worsened considerably in the last few days.  She thinks she felt hot the night prior to admission.  She normally uses 2 to 3 L oxygen  at night at baseline. Reportedly, when EMS arrived, oxygen  saturation was 90% and she was placed on 4 L oxygen  and oxygen  saturation improved to 98%.  Vital signs in the ED: Temperature 98.8 F, respiratory 24, pulse 56, BP 167/73, oxygen  saturation 100% on 4 L oxygen .   proBNP 3,179.  Troponins 46 and second troponin 55.  Chest x-ray   IMPRESSION: 1. Cardiomegaly, moderate, without lobar consolidation. 2. Lower lung predominant interstitial prominence and indistinctness, likely interstitial edema.    Assessment/Plan:   Principal Problem:   Hypoxia   Body mass index is 17.68 kg/m.   Acute on chronic CHF with preserved EF: S/p treatment with IV Lasix .  Discontinue IV Lasix  and start oral Lasix . Recent cardiac MRI in May 2025 showed EF estimated at 67%. 2D echo in August 2024 showed EF estimated at 55 to 60%, grade 2 diastolic dysfunction.   Mildly elevated troponins: This is due to demand ischemia. CAD: Continue aspirin  and statin   COPD: Continue bronchodilators   Chronic hypoxic respiratory failure: Continue oxygen  at night and as needed. Respiratory viral panel, RSV, influenza and SARS-CoV-2 test were  negative   Hypertension: Continue carvedilol    General weakness: PT recommended discharge to SNF.  Follow-up with TOC to assist with disposition.   Comorbidities include dyslipidemia, CKD stage IIIa    Diet Order             Diet regular Room service appropriate? Yes; Fluid consistency: Thin  Diet effective now                                  Consultants: None  Procedures: None    Medications:    aspirin  EC  81 mg Oral Daily   atorvastatin   20 mg Oral Daily   carvedilol   6.25 mg Oral BID WC   enoxaparin  (LOVENOX ) injection  40 mg Subcutaneous Q24H   feeding supplement  237 mL Oral BID BM   furosemide   40 mg Intravenous Q12H   polyethylene glycol  17 g Oral Daily   umeclidinium-vilanterol  1 puff Inhalation Daily   Continuous Infusions:   Anti-infectives (From admission, onward)    None              Family Communication/Anticipated D/C date and plan/Code Status   DVT prophylaxis: enoxaparin  (LOVENOX ) injection 40 mg Start: 08/29/24 1445 SCDs Start: 08/29/24 1434     Code Status: Full Code  Family Communication: None Disposition Plan: Plan to discharge to SNF   Status is: Observation The patient will require care spanning > 2 midnights and should be moved to inpatient  because: CHF exacerbation       Subjective:   Interval events noted.  She feels a little better today.  She complains of general weakness.  She says she no longer feels hot.  Objective:    Vitals:   08/29/24 1725 08/29/24 2031 08/30/24 0505 08/30/24 0824  BP: (!) 183/81 (!) 179/89 (!) 163/68 127/66  Pulse: 79 60 65 65  Resp: 18 18 18 19   Temp: 99.2 F (37.3 C) 99.1 F (37.3 C) 97.8 F (36.6 C) 99.4 F (37.4 C)  TempSrc: Oral Oral  Oral  SpO2: 98%  96% 91%  Weight:   48.2 kg   Height:       No data found.  No intake or output data in the 24 hours ending 08/30/24 0948 Filed Weights   08/29/24 1122 08/30/24 0505  Weight: 56.7 kg 48.2  kg    Exam:  GEN: NAD SKIN: Warm and dry EYES: No pallor or icterus ENT: MMM CV: RRR PULM: CTA B ABD: soft, ND, NT, +BS CNS: AAO x 2 (person and place), non focal EXT: No edema or tenderness      Data Reviewed:   I have personally reviewed following labs and imaging studies:  Labs: Labs show the following:   Basic Metabolic Panel: Recent Labs  Lab 08/29/24 1311 08/30/24 0446  NA 137 135  K 4.5 3.5  CL 101 94*  CO2 28 30  GLUCOSE 114* 108*  BUN 18 19  CREATININE 0.75 0.91  CALCIUM  10.2 10.2   GFR Estimated Creatinine Clearance: 34.4 mL/min (by C-G formula based on SCr of 0.91 mg/dL). Liver Function Tests: Recent Labs  Lab 08/29/24 1311  AST 28  ALT 11  ALKPHOS 97  BILITOT 0.9  PROT 6.7  ALBUMIN 3.4*   No results for input(s): LIPASE, AMYLASE in the last 168 hours. No results for input(s): AMMONIA in the last 168 hours. Coagulation profile No results for input(s): INR, PROTIME in the last 168 hours.  CBC: Recent Labs  Lab 08/29/24 1127  WBC 8.0  NEUTROABS 6.6  HGB 13.6  HCT 41.1  MCV 100.2*  PLT 114*   Cardiac Enzymes: No results for input(s): CKTOTAL, CKMB, CKMBINDEX, TROPONINI in the last 168 hours. BNP (last 3 results) Recent Labs    08/29/24 1311  PROBNP 3,179.0*   CBG: No results for input(s): GLUCAP in the last 168 hours. D-Dimer: No results for input(s): DDIMER in the last 72 hours. Hgb A1c: No results for input(s): HGBA1C in the last 72 hours. Lipid Profile: No results for input(s): CHOL, HDL, LDLCALC, TRIG, CHOLHDL, LDLDIRECT in the last 72 hours. Thyroid  function studies: No results for input(s): TSH, T4TOTAL, T3FREE, THYROIDAB in the last 72 hours.  Invalid input(s): FREET3 Anemia work up: No results for input(s): VITAMINB12, FOLATE, FERRITIN, TIBC, IRON, RETICCTPCT in the last 72 hours. Sepsis Labs: Recent Labs  Lab 08/29/24 1127  WBC 8.0  LATICACIDVEN 1.1     Microbiology Recent Results (from the past 240 hours)  Resp panel by RT-PCR (RSV, Flu A&B, Covid) Anterior Nasal Swab     Status: None   Collection Time: 08/29/24 11:52 AM   Specimen: Anterior Nasal Swab  Result Value Ref Range Status   SARS Coronavirus 2 by RT PCR NEGATIVE NEGATIVE Final    Comment: (NOTE) SARS-CoV-2 target nucleic acids are NOT DETECTED.  The SARS-CoV-2 RNA is generally detectable in upper respiratory specimens during the acute phase of infection. The lowest concentration of SARS-CoV-2 viral copies this assay  can detect is 138 copies/mL. A negative result does not preclude SARS-Cov-2 infection and should not be used as the sole basis for treatment or other patient management decisions. A negative result may occur with  improper specimen collection/handling, submission of specimen other than nasopharyngeal swab, presence of viral mutation(s) within the areas targeted by this assay, and inadequate number of viral copies(<138 copies/mL). A negative result must be combined with clinical observations, patient history, and epidemiological information. The expected result is Negative.  Fact Sheet for Patients:  bloggercourse.com  Fact Sheet for Healthcare Providers:  seriousbroker.it  This test is no t yet approved or cleared by the [place]  FDA and  has been authorized for detection and/or diagnosis of SARS-CoV-2 by FDA under an Emergency Use Authorization (EUA). This EUA will remain  in effect (meaning this test can be used) for the duration of the COVID-19 declaration under Section 564(b)(1) of the Act, 21 U.S.C.section 360bbb-3(b)(1), unless the authorization is terminated  or revoked sooner.       Influenza A by PCR NEGATIVE NEGATIVE Final   Influenza B by PCR NEGATIVE NEGATIVE Final    Comment: (NOTE) The Xpert Xpress SARS-CoV-2/FLU/RSV plus assay is intended as an aid in the diagnosis of  influenza from Nasopharyngeal swab specimens and should not be used as a sole basis for treatment. Nasal washings and aspirates are unacceptable for Xpert Xpress SARS-CoV-2/FLU/RSV testing.  Fact Sheet for Patients: bloggercourse.com  Fact Sheet for Healthcare Providers: seriousbroker.it  This test is not yet approved or cleared by the [place]  FDA and has been authorized for detection and/or diagnosis of SARS-CoV-2 by FDA under an Emergency Use Authorization (EUA). This EUA will remain in effect (meaning this test can be used) for the duration of the COVID-19 declaration under Section 564(b)(1) of the Act, 21 U.S.C. section 360bbb-3(b)(1), unless the authorization is terminated or revoked.     Resp Syncytial Virus by PCR NEGATIVE NEGATIVE Final    Comment: (NOTE) Fact Sheet for Patients: bloggercourse.com  Fact Sheet for Healthcare Providers: seriousbroker.it  This test is not yet approved or cleared by the [place]  FDA and has been authorized for detection and/or diagnosis of SARS-CoV-2 by FDA under an Emergency Use Authorization (EUA). This EUA will remain in effect (meaning this test can be used) for the duration of the COVID-19 declaration under Section 564(b)(1) of the Act, 21 U.S.C. section 360bbb-3(b)(1), unless the authorization is terminated or revoked.  Performed at [facility], [address] [place], [place] [address]   Respiratory (~20 pathogens) panel by PCR     Status: None   Collection Time: 08/29/24  3:40 PM   Specimen: Nasopharyngeal Swab; Respiratory  Result Value Ref Range Status   Adenovirus NOT DETECTED NOT DETECTED Final   Coronavirus 229E NOT DETECTED NOT DETECTED Final    Comment: (NOTE) The Coronavirus on the Respiratory Panel, DOES NOT test for the novel  Coronavirus (2019 nCoV)    Coronavirus HKU1 NOT DETECTED NOT  DETECTED Final   Coronavirus NL63 NOT DETECTED NOT DETECTED Final   Coronavirus OC43 NOT DETECTED NOT DETECTED Final   Metapneumovirus NOT DETECTED NOT DETECTED Final   Rhinovirus / Enterovirus NOT DETECTED NOT DETECTED Final   Influenza A NOT DETECTED NOT DETECTED Final   Influenza B NOT DETECTED NOT DETECTED Final   Parainfluenza Virus 1 NOT DETECTED NOT DETECTED Final   Parainfluenza Virus 2 NOT DETECTED NOT DETECTED Final   Parainfluenza Virus 3 NOT DETECTED NOT DETECTED Final  Parainfluenza Virus 4 NOT DETECTED NOT DETECTED Final   Respiratory Syncytial Virus NOT DETECTED NOT DETECTED Final   Bordetella pertussis NOT DETECTED NOT DETECTED Final   Bordetella Parapertussis NOT DETECTED NOT DETECTED Final   Chlamydophila pneumoniae NOT DETECTED NOT DETECTED Final   Mycoplasma pneumoniae NOT DETECTED NOT DETECTED Final    Comment: Performed at [facility], [address], [place], [place] [address]    Procedures and diagnostic studies:  DG Chest 2 View Result Date: 08/29/2024 EXAM: 2 VIEW(S) XRAY OF THE CHEST 08/29/2024 11:54:45 AM COMPARISON: 03/15/2024 CLINICAL HISTORY: SOB (shortness of breath) FINDINGS: LUNGS AND PLEURA: The chin overlies the apices on the frontal radiograph. Lower lung predominant interstitial prominence and indistinctness. No lobar consolidation. No pleural effusion. No pneumothorax. HEART AND MEDIASTINUM: Moderate cardiomegaly. Transverse aortic atherosclerosis. BONES AND SOFT TISSUES: Median sternotomy for CABG. Left axillary node dissection. No acute osseous abnormality. IMPRESSION: 1. Cardiomegaly, moderate, without lobar consolidation. 2. Lower lung predominant interstitial prominence and indistinctness, likely interstitial edema. Electronically signed by: Rockey Kilts MD 08/29/2024 12:22 PM EST RP Workstation: [id number]               LOS: 0 days   Latonya Nelon  [facility]   Pager on [email or web address]. If 7PM-7AM, please contact  night-coverage at [email or web address]     08/30/2024, 9:48 AM

## 2024-08-30 NOTE — Evaluation (Signed)
 Physical Therapy Evaluation Patient Details Name: BOBETTE LEYH MRN: [id number] DOB: September 18, 1941 Today's Date: 08/30/2024  History of Present Illness  Patient is a 83 year old female with shortness of breath, acute on chronic hypoxic respiratory failure. PMH: hypertension, hyperlipidemia, COPD, CAD s/p PCI and CABG , diastolic CHF, CKD-3A, thrombocytopenia, pulmonary MAI, IBS, gallstone, PVC/PAC, breast cancer.  Clinical Impression  Patient trying to get out of bed on arrival to room. She is confused but cooperative during session. She lives at [facility] independent living facility per her report. She is ambulatory with a rollator at baseline and uses oxygen  at night.  Today the patient required assistance with bed mobility, transfers, and short distance ambulation with rolling walker. Activity tolerance limited by fatigue with mild dyspnea with exertion. Sp02 in the mid 90's during session. She does not appear to be at her baseline level of functional independence. Recommend to continue PT to maximize independence. Rehabilitation < 3 hours/day recommended after this hospital stay.     If plan is discharge home, recommend the following: A little help with walking and/or transfers;A little help with bathing/dressing/bathroom;Assistance with cooking/housework;Assist for transportation;Supervision due to cognitive status   Can travel by private vehicle   No    Equipment Recommendations None recommended by PT  Recommendations for Other Services       Functional Status Assessment Patient has had a recent decline in their functional status and demonstrates the ability to make significant improvements in function in a reasonable and predictable amount of time.     Precautions / Restrictions Precautions Precautions: Fall Recall of Precautions/Restrictions: Impaired Restrictions Weight Bearing Restrictions Per Provider Order: No      Mobility  Bed Mobility Overal bed mobility: Needs  Assistance Bed Mobility: Supine to Sit, Sit to Supine     Supine to sit: Min assist Sit to supine: Min assist   General bed mobility comments: assistance for trunk support to sit upright. assistance for LE support to return to bed    Transfers Overall transfer level: Needs assistance Equipment used: 1 person hand held assist Transfers: Sit to/from Stand Sit to Stand: Min assist           General transfer comment: lifting assistance reuqired. cues for hand placement    Ambulation/Gait Ambulation/Gait assistance: Min assist Gait Distance (Feet): 5 Feet Assistive device: Rolling walker (2 wheels) Gait Pattern/deviations: Step-through pattern Gait velocity: decreased     General Gait Details: activity tolerance limited by fatigue. dyspnea with exertion. Sp02 95% after return to bed  Stairs            Wheelchair Mobility     Tilt Bed    Modified Rankin (Stroke Patients Only)       Balance Overall balance assessment: Needs assistance Sitting-balance support: Feet supported Sitting balance-Leahy Scale: Fair     Standing balance support: Bilateral upper extremity supported Standing balance-Leahy Scale: Poor Standing balance comment: external support required                             Pertinent Vitals/Pain Pain Assessment Pain Assessment: No/denies pain    Home Living Family/patient expects to be discharged to:: Other (Comment) [facility] (ILF per patient report))                        Prior Function Prior Level of Function : Independent/Modified Independent;History of Falls (last six months)  Mobility Comments: rollator for ambulation       Extremity/Trunk Assessment   Upper Extremity Assessment Upper Extremity Assessment: Generalized weakness    Lower Extremity Assessment Lower Extremity Assessment: Generalized weakness    Cervical / Trunk Assessment Cervical / Trunk Assessment: Kyphotic   Communication   Communication Communication: Impaired Factors Affecting Communication: Difficulty expressing self    Cognition Arousal: Alert Behavior During Therapy: Anxious   PT - Cognitive impairments: Memory, Initiation, Sequencing, Difficult to assess Difficult to assess due to: Impaired communication (patient talks minimally)                     PT - Cognition Comments: patient is confused. she was trying to get out of bed on arrival to room but then reports she does not want to get up. Following commands: Impaired Following commands impaired: Follows one step commands with increased time     Cueing Cueing Techniques: Verbal cues     General Comments General comments (skin integrity, edema, etc.): patient appears fatigued with minimal activity    Exercises     Assessment/Plan    PT Assessment Patient needs continued PT services  PT Problem List Decreased strength;Decreased range of motion;Decreased activity tolerance;Decreased balance;Decreased mobility;Decreased knowledge of use of DME;Decreased safety awareness;Decreased cognition       PT Treatment Interventions DME instruction;Gait training;Functional mobility training;Therapeutic activities;Therapeutic exercise;Balance training;Neuromuscular re-education;Cognitive remediation;Patient/family education;Wheelchair mobility training    PT Goals (Current goals can be found in the Care Plan section)  Acute Rehab PT Goals Patient Stated Goal: none stated PT Goal Formulation: Patient unable to participate in goal setting Time For Goal Achievement: 09/13/24 Potential to Achieve Goals: Fair    Frequency Min 2X/week     Co-evaluation               AM-PAC PT 6 Clicks Mobility  Outcome Measure Help needed turning from your back to your side while in a flat bed without using bedrails?: A Little Help needed moving from lying on your back to sitting on the side of a flat bed without using bedrails?: A  Little Help needed moving to and from a bed to a chair (including a wheelchair)?: A Little Help needed standing up from a chair using your arms (e.g., wheelchair or bedside chair)?: A Little Help needed to walk in hospital room?: A Little Help needed climbing 3-5 steps with a railing? : A Lot 6 Click Score: 17    End of Session Equipment Utilized During Treatment: Oxygen  Activity Tolerance: Patient limited by fatigue Patient left: in bed;with call bell/phone within reach;with bed alarm set   PT Visit Diagnosis: Muscle weakness (generalized) (M62.81);Unsteadiness on feet (R26.81)    Time: 1430-1440 PT Time Calculation (min) (ACUTE ONLY): 10 min   Charges:   PT Evaluation $PT Eval Low Complexity: 1 Low   PT General Charges $$ ACUTE PT VISIT: 1 Visit         Randine Essex, PT, MPT   Randine LULLA Essex 08/30/2024, 3:03 PM

## 2024-08-30 NOTE — Progress Notes (Signed)
 Mobility Specialist - Progress Note    08/30/24 1657  Mobility  Activity Ambulated with assistance  Level of Assistance Contact guard assist, steadying assist  Assistive Device Front wheel walker  Distance Ambulated (ft) 35 ft  Range of Motion/Exercises Active  Activity Response Tolerated well  Mobility Referral Yes  Mobility visit 1 Mobility  Mobility Specialist Start Time (ACUTE ONLY) 1636  Mobility Specialist Stop Time (ACUTE ONLY) 1651  Mobility Specialist Time Calculation (min) (ACUTE ONLY) 15 min   Nurse requested Mobility Specialist to perform oxygen  saturation test with pt which includes removing pt from oxygen  both at rest and while ambulating.  Below are the results from that testing.     Patient Saturations on Room Air at Rest = spO2 94%  Patient Saturations on Room Air while Ambulating = sp02 92% .  Rested and performed pursed lip breathing for 1 minute with sp02 at 91%.  At end of testing pt left in room on room air   Liters of oxygen .  Reported results to nurse.    Pt resting in bed on RA upon entry. Pt STS and ambulates to hallway around NS CGA with RW. Pt endorses no pain but does make painful sounds/faces during ambulation. Pt expressed feeling weakness at 25 feet and pt returned to bed and left with needs in reach. Bed alarm activated.

## 2024-08-31 DIAGNOSIS — R0902 Hypoxemia: Secondary | ICD-10-CM | POA: Diagnosis not present

## 2024-08-31 NOTE — TOC Initial Note (Signed)
 Transition of Care [facility]) - Initial/Assessment Note    Patient Details  Name: Tiffany Caldwell MRN: [id number] Date of Birth: [birth year]  Transition [facility] Care [facility]) CM/SW Contact:    Daved JONETTA Hamilton, RN Phone Number: 08/31/2024, 4:23 PM  Clinical Narrative:                  Met with patient, introduced self, explained role. Discussed with patient therapy recommendations for SNF-STR, patient verbalized agreement and that she would like to do rehab at [facility]. Patient verbalized moderate independence prior to recent hospital encounters. Patient verbalized her children assist with transportation needs.   Spoke with Alfonso at [facility], she verbally requested I start prior auth for STR.  FL2 sent for signature, referral sent to [facility] in [place], this CM lvmm with [employer] to call this CM back so that I can request prior auth.      Expected Discharge Plan: Skilled Nursing Facility Barriers to Discharge: Insurance Authorization   Patient Goals and CMS Choice            Expected Discharge Plan and Services   Discharge Planning Services: CM Consult   Living arrangements for the past 2 months: Independent Living Facility                                      Prior Living Arrangements/Services Living arrangements for the past 2 months: Independent Living Facility Lives with:: Spouse Patient language and need for interpreter reviewed:: Yes Do you feel safe going back to the place where you live?: Yes      Need for Family Participation in Patient Care: Yes (Comment) Care giver support system in place?: Yes (comment)   Criminal Activity/Legal Involvement Pertinent to Current Situation/Hospitalization: No - Comment as needed  Activities of Daily Living   ADL Screening (condition at time of admission) Independently performs ADLs?: Yes (appropriate for developmental age) Is the patient deaf or have difficulty hearing?: Yes Does the patient have  difficulty seeing, even when wearing glasses/contacts?: Yes Does the patient have difficulty concentrating, remembering, or making decisions?: No  Permission Sought/Granted Permission sought to share information with : Family Supports, Magazine Features Editor, Case Estate Manager/land Agent granted to share information with : Yes, Verbal Permission Granted  Share Information with NAME: Atira Borello spouse, Vidhi Delellis [phone]  Permission granted to share info w AGENCY: [place]  Permission granted to share info w Relationship: spouse and son  Permission granted to share info w Contact Information: Vinie JASMINE [phone]  Emotional Assessment Appearance:: Appears stated age, Well-Groomed Attitude/Demeanor/Rapport: Engaged Affect (typically observed): Appropriate, Calm   Alcohol / Substance Use: Not Applicable Psych Involvement: No (comment)  Admission diagnosis:  Hypoxia [R09.02] Acute on chronic congestive heart failure, unspecified heart failure type [facility]) [I50.9] Patient Active Problem List   Diagnosis Date Noted   Hypoxia 08/29/2024   Thoracic kyphosis 05/18/2024   Significant disease of coronary bypass graft 05/18/2024   Seasonal allergic rhinitis 05/18/2024   Pure hypercholesterolemia 05/18/2024   Primary hyperparathyroidism 05/18/2024   Osteopenia 05/18/2024   Mycobacterium avium-intracellulare infection (HCC) 05/18/2024   Localized primary osteoarthritis of wrist 05/18/2024   Chronic kidney disease due to hypertension 05/18/2024   Atherosclerosis of aorta 05/18/2024   Adenomatous polyp of colon 05/18/2024   Chronic heart failure with preserved ejection fraction (HFpEF) (HCC) 01/13/2024   Afib (HCC) 01/11/2024   Frequent PVCs  11/05/2023   Chronic kidney disease, stage 3a (HCC) 11/05/2023   Thrombocytopenia 11/05/2023   Gallstone 11/05/2023   Pulmonary hypertension (HCC) 06/17/2023   PAC (premature atrial contraction) 09/10/2018   Pinched  nerve    Irritable bowel syndrome (IBS)    Hyperlipidemia LDL goal <70    GERD (gastroesophageal reflux disease)    Emphysema lung (HCC)    DDD (degenerative disc disease), lumbosacral    Arthritis    Allergy    Labile hypertension    Primary localized osteoarthritis of right hip 08/05/2016   Primary osteoarthritis of right hip 08/05/2016   Coronary artery disease 05/01/2015   Essential hypertension 05/01/2015   Dyslipidemia 05/01/2015   Right lower lobe lung mass 12/14/2014   COPD (chronic obstructive pulmonary disease) (HCC) 12/14/2014   History of breast cancer 07/25/2011   Diverticulosis of colon 09/23/2010   PCP:  Gil Greig BRAVO, NP Pharmacy:   [facility] [phone] [address], [place] - [address] [place] [address] Phone: [phone] Fax: [phone]  [facility] [place], [place] - [address] [place] [address] Phone: [phone] Fax: [phone]     Social Drivers of Health (SDOH) Social History: SDOH Screenings   Food Insecurity: No Food Insecurity (08/29/2024)  Housing: Low Risk  (08/29/2024)  Transportation Needs: No Transportation Needs (08/29/2024)  Utilities: Not At Risk (08/29/2024)  Depression (PHQ2-9): Low Risk  (08/03/2024)  Financial Resource Strain: Low Risk  (05/17/2024)  Physical Activity: Inactive (05/17/2024)  Social Connections: Socially Integrated (08/29/2024)  Stress: No Stress Concern Present (05/17/2024)  Tobacco Use: Medium Risk (08/29/2024)   SDOH Interventions:     Readmission Risk Interventions     No data to display

## 2024-08-31 NOTE — Progress Notes (Signed)
 PROGRESS NOTE    Tiffany Caldwell   FMW:[id number] DOB: 04-05-1941  DOA: 08/29/2024 Date of Service: 08/31/24 which is hospital day 0  PCP: Gil Greig BRAVO, NP    Hospital course / significant events:   Tiffany Caldwell is a 83 y.o. female  with PMH significant for hypertension, hyperlipidemia, COPD, CAD s/p PCI and CABG , diastolic CHF, CKD-3A, thrombocytopenia, pulmonary MAI, IBS, gallstone, PVC/PAC, breast cancer.  She lives at [facility].  She presented to the ED because of shortness of breath and easy fatigability   HPI: symptoms about 2 days duration. She endorsed that she has chronic shortness of breath at baseline but this has worsened considerably in the last few days. She thinks she felt hot the night prior to admission. She normally uses 2 to 3 L oxygen  at night at baseline.   11/24: to ED. Admitted to hospitalist. IV diuresis 11/25: doing well and back onto po diuretics. PT/OT recs for SNF rehab  11/26: euvolemic, still on O2 around-the-clock (baseline just at night), walk test for ox sats pending. Approaching medical stability, pending auth for [facility] SNF      Consultants:  none  Procedures/Surgeries: none      ASSESSMENT & PLAN:   Acute on chronic CHF with preserved EF Discontinue IV Lasix  and start oral Lasix . Euvolemic at this time, monitor I&O (under-documented)    Coronary Artery Disease without Acute Coronary Syndrome Mildly elevated troponins: This is due to demand ischemia. Continue aspirin , statin, beta blocker    COPD Acute on chronic hypoxic respiratory failure  O2 walk test see if need supplemental O2 around-the-clock Continue bronchodilators   Hypertension Continue carvedilol    HLD Statin  CKD3a Monitor BMP  General weakness:  PT recommended discharge to SNF.  Follow-up with TOC to assist with disposition.         Underweight based on BMI: Body mass index is 17.76 kg/m.SABRA Significantly low or high BMI is associated  with higher medical risk.  Underweight - under 18  overweight - 25 to 29 obese - 30 or more Class 1 obesity: BMI of 30.0 to 34 Class 2 obesity: BMI of 35.0 to 39 Class 3 obesity: BMI of 40.0 to 49 Super Morbid Obesity: BMI 50-59 Super-super Morbid Obesity: BMI 60+ Healthy nutrition and physical activity advised as adjunct to other disease management and risk reduction treatments    DVT prophylaxis: lovenox  IV fluids: no continuous IV fluids  Nutrition: regular Central lines / other devices: none  Code Status: FULL CODE ACP documentation reviewed: MOST is c/w current treatment pan, is on file in [facility]  [facility] needs: SNF auth Medical barriers to dispo: diuresing to po . Expected medical readiness for discharge tomorrow.              Subjective / Brief ROS:  Patient reports feeling improved today still tired Denies CP/SOB at rest Pain controlled.  Denies new weakness.  Tolerating diet.  Reports no concerns w/ urination/defecation.   Family Communication: none at bedside     Objective Findings:  Vitals:   08/30/24 1541 08/30/24 1937 08/31/24 0500 08/31/24 0501  BP: (!) 149/63 131/67  (!) 151/70  Pulse: 62 (!) 54  62  Resp: 16 (!) 24  (!) 24  Temp: 98.1 F (36.7 C) 98.5 F (36.9 C)  98.1 F (36.7 C)  TempSrc: Oral Oral  Oral  SpO2: (!) 89% 99%  91%  Weight:   48.4 kg   Height:  Intake/Output Summary (Last 24 hours) at 08/31/2024 1604 Last data filed at 08/31/2024 1023 Gross per 24 hour  Intake 120 ml  Output --  Net 120 ml   Filed Weights   08/29/24 1122 08/30/24 0505 08/31/24 0500  Weight: 56.7 kg 48.2 kg 48.4 kg    Examination:  Physical Exam Constitutional:      General: She is not in acute distress. Cardiovascular:     Rate and Rhythm: Normal rate and regular rhythm.  Pulmonary:     Effort: Pulmonary effort is normal.     Breath sounds: No wheezing, rhonchi or rales.  Musculoskeletal:     Right lower leg: No edema.     Left  lower leg: No edema.  Skin:    General: Skin is warm and dry.  Neurological:     Mental Status: She is alert and oriented to person, place, and time.  Psychiatric:        Mood and Affect: Mood normal.        Behavior: Behavior normal.          Scheduled Medications:   aspirin  EC  81 mg Oral Daily   atorvastatin   20 mg Oral Daily   carvedilol   6.25 mg Oral BID WC   enoxaparin  (LOVENOX ) injection  40 mg Subcutaneous Q24H   feeding supplement  237 mL Oral BID BM   furosemide   20 mg Oral Daily   polyethylene glycol  17 g Oral Daily   umeclidinium-vilanterol  1 puff Inhalation Daily    Continuous Infusions:   PRN Medications:  acetaminophen  **OR** acetaminophen , albuterol , bisacodyl , ipratropium-albuterol , ondansetron  **OR** ondansetron  (ZOFRAN ) IV  Antimicrobials from admission:  Anti-infectives (From admission, onward)    None           Data Reviewed:  I have personally reviewed the following...  CBC: Recent Labs  Lab 08/29/24 1127  WBC 8.0  NEUTROABS 6.6  HGB 13.6  HCT 41.1  MCV 100.2*  PLT 114*   Basic Metabolic Panel: Recent Labs  Lab 08/29/24 1311 08/30/24 0446  NA 137 135  K 4.5 3.5  CL 101 94*  CO2 28 30  GLUCOSE 114* 108*  BUN 18 19  CREATININE 0.75 0.91  CALCIUM  10.2 10.2   GFR: Estimated Creatinine Clearance: 34.5 mL/min (by C-G formula based on SCr of 0.91 mg/dL). Liver Function Tests: Recent Labs  Lab 08/29/24 1311  AST 28  ALT 11  ALKPHOS 97  BILITOT 0.9  PROT 6.7  ALBUMIN 3.4*   No results for input(s): LIPASE, AMYLASE in the last 168 hours. No results for input(s): AMMONIA in the last 168 hours. Coagulation Profile: No results for input(s): INR, PROTIME in the last 168 hours. Cardiac Enzymes: No results for input(s): CKTOTAL, CKMB, CKMBINDEX, TROPONINI in the last 168 hours. BNP (last 3 results) Recent Labs    08/29/24 1311  PROBNP 3,179.0*   HbA1C: No results for input(s): HGBA1C in the  last 72 hours. CBG: No results for input(s): GLUCAP in the last 168 hours. Lipid Profile: No results for input(s): CHOL, HDL, LDLCALC, TRIG, CHOLHDL, LDLDIRECT in the last 72 hours. Thyroid  Function Tests: No results for input(s): TSH, T4TOTAL, FREET4, T3FREE, THYROIDAB in the last 72 hours. Anemia Panel: No results for input(s): VITAMINB12, FOLATE, FERRITIN, TIBC, IRON, RETICCTPCT in the last 72 hours. Most Recent Urinalysis On File:     Component Value Date/Time   COLORURINE YELLOW (A) 08/29/2024 1448   APPEARANCEUR CLEAR (A) 08/29/2024 1448   LABSPEC 1.009 08/29/2024  1448   PHURINE 7.0 08/29/2024 1448   GLUCOSEU NEGATIVE 08/29/2024 1448   HGBUR NEGATIVE 08/29/2024 1448   BILIRUBINUR NEGATIVE 08/29/2024 1448   KETONESUR NEGATIVE 08/29/2024 1448   PROTEINUR NEGATIVE 08/29/2024 1448   NITRITE NEGATIVE 08/29/2024 1448   LEUKOCYTESUR NEGATIVE 08/29/2024 1448   Sepsis Labs: @LABRCNTIP (procalcitonin:4,lacticidven:4) Microbiology: Recent Results (from the past 240 hours)  Resp panel by RT-PCR (RSV, Flu A&B, Covid) Anterior Nasal Swab     Status: None   Collection Time: 08/29/24 11:52 AM   Specimen: Anterior Nasal Swab  Result Value Ref Range Status   SARS Coronavirus 2 by RT PCR NEGATIVE NEGATIVE Final    Comment: (NOTE) SARS-CoV-2 target nucleic acids are NOT DETECTED.  The SARS-CoV-2 RNA is generally detectable in upper respiratory specimens during the acute phase of infection. The lowest concentration of SARS-CoV-2 viral copies this assay can detect is 138 copies/mL. A negative result does not preclude SARS-Cov-2 infection and should not be used as the sole basis for treatment or other patient management decisions. A negative result may occur with  improper specimen collection/handling, submission of specimen other than nasopharyngeal swab, presence of viral mutation(s) within the areas targeted by this assay, and inadequate number of  viral copies(<138 copies/mL). A negative result must be combined with clinical observations, patient history, and epidemiological information. The expected result is Negative.  Fact Sheet for Patients:  bloggercourse.com  Fact Sheet for Healthcare Providers:  seriousbroker.it  This test is no t yet approved or cleared by the [place]  FDA and  has been authorized for detection and/or diagnosis of SARS-CoV-2 by FDA under an Emergency Use Authorization (EUA). This EUA will remain  in effect (meaning this test can be used) for the duration of the COVID-19 declaration under Section 564(b)(1) of the Act, 21 U.S.C.section 360bbb-3(b)(1), unless the authorization is terminated  or revoked sooner.       Influenza A by PCR NEGATIVE NEGATIVE Final   Influenza B by PCR NEGATIVE NEGATIVE Final    Comment: (NOTE) The Xpert Xpress SARS-CoV-2/FLU/RSV plus assay is intended as an aid in the diagnosis of influenza from Nasopharyngeal swab specimens and should not be used as a sole basis for treatment. Nasal washings and aspirates are unacceptable for Xpert Xpress SARS-CoV-2/FLU/RSV testing.  Fact Sheet for Patients: bloggercourse.com  Fact Sheet for Healthcare Providers: seriousbroker.it  This test is not yet approved or cleared by the [place]  FDA and has been authorized for detection and/or diagnosis of SARS-CoV-2 by FDA under an Emergency Use Authorization (EUA). This EUA will remain in effect (meaning this test can be used) for the duration of the COVID-19 declaration under Section 564(b)(1) of the Act, 21 U.S.C. section 360bbb-3(b)(1), unless the authorization is terminated or revoked.     Resp Syncytial Virus by PCR NEGATIVE NEGATIVE Final    Comment: (NOTE) Fact Sheet for Patients: bloggercourse.com  Fact Sheet for Healthcare  Providers: seriousbroker.it  This test is not yet approved or cleared by the [place]  FDA and has been authorized for detection and/or diagnosis of SARS-CoV-2 by FDA under an Emergency Use Authorization (EUA). This EUA will remain in effect (meaning this test can be used) for the duration of the COVID-19 declaration under Section 564(b)(1) of the Act, 21 U.S.C. section 360bbb-3(b)(1), unless the authorization is terminated or revoked.  Performed at [facility], [address] [place], [place] [address]   Respiratory (~20 pathogens) panel by PCR     Status: None   Collection Time: 08/29/24  3:40 PM   Specimen: Nasopharyngeal Swab; Respiratory  Result Value Ref Range Status   Adenovirus NOT DETECTED NOT DETECTED Final   Coronavirus 229E NOT DETECTED NOT DETECTED Final    Comment: (NOTE) The Coronavirus on the Respiratory Panel, DOES NOT test for the novel  Coronavirus (2019 nCoV)    Coronavirus HKU1 NOT DETECTED NOT DETECTED Final   Coronavirus NL63 NOT DETECTED NOT DETECTED Final   Coronavirus OC43 NOT DETECTED NOT DETECTED Final   Metapneumovirus NOT DETECTED NOT DETECTED Final   Rhinovirus / Enterovirus NOT DETECTED NOT DETECTED Final   Influenza A NOT DETECTED NOT DETECTED Final   Influenza B NOT DETECTED NOT DETECTED Final   Parainfluenza Virus 1 NOT DETECTED NOT DETECTED Final   Parainfluenza Virus 2 NOT DETECTED NOT DETECTED Final   Parainfluenza Virus 3 NOT DETECTED NOT DETECTED Final   Parainfluenza Virus 4 NOT DETECTED NOT DETECTED Final   Respiratory Syncytial Virus NOT DETECTED NOT DETECTED Final   Bordetella pertussis NOT DETECTED NOT DETECTED Final   Bordetella Parapertussis NOT DETECTED NOT DETECTED Final   Chlamydophila pneumoniae NOT DETECTED NOT DETECTED Final   Mycoplasma pneumoniae NOT DETECTED NOT DETECTED Final    Comment: Performed at [facility], [address] [place], [place] [address]      Radiology  Studies last 3 days: DG Chest 2 View Result Date: 08/29/2024 EXAM: 2 VIEW(S) XRAY OF THE CHEST 08/29/2024 11:54:45 AM COMPARISON: 03/15/2024 CLINICAL HISTORY: SOB (shortness of breath) FINDINGS: LUNGS AND PLEURA: The chin overlies the apices on the frontal radiograph. Lower lung predominant interstitial prominence and indistinctness. No lobar consolidation. No pleural effusion. No pneumothorax. HEART AND MEDIASTINUM: Moderate cardiomegaly. Transverse aortic atherosclerosis. BONES AND SOFT TISSUES: Median sternotomy for CABG. Left axillary node dissection. No acute osseous abnormality. IMPRESSION: 1. Cardiomegaly, moderate, without lobar consolidation. 2. Lower lung predominant interstitial prominence and indistinctness, likely interstitial edema. Electronically signed by: Rockey Kilts MD 08/29/2024 12:22 PM EST RP Workstation: [id number]          Laneta Blunt, DO [facility] 08/31/2024, 4:04 PM    Dictation software may have been used to generate the above note. Typos may occur and escape review in typed/dictated notes. Please contact Dr Blunt directly for clarity if needed.  Staff may message me via secure chat in Epic  but this may not receive an immediate response,  please page me for urgent matters!  If 7PM-7AM, please contact night coverage [email or web address]

## 2024-08-31 NOTE — Progress Notes (Signed)
 Mobility Specialist - Progress Note   08/31/24 1628  Mobility  Activity Ambulated with assistance;Stood at bedside;Pivoted/transferred to/from [facility]  Level of Assistance Standby assist, set-up cues, supervision of patient - no hands on  Assistive Device Front wheel walker  Distance Ambulated (ft) 15 ft  Range of Motion/Exercises Active;All extremities  Activity Response Tolerated well  Mobility visit 1 Mobility  Mobility Specialist Start Time (ACUTE ONLY) 1621  Mobility Specialist Stop Time (ACUTE ONLY) 1626  Mobility Specialist Time Calculation (min) (ACUTE ONLY) 5 min   Pt was having a BM upon entry. Pt ambulated from bathroom to bed with 2ww. Pt is in bed on RA and needs in reach and bed alarm on.  Clem Rodes Mobility Specialist 08/31/24, 4:30 PM

## 2024-08-31 NOTE — [place] FL2 (Signed)
 [place]  MEDICAID [id number] LEVEL OF CARE FORM     IDENTIFICATION  Patient Name: Tiffany Caldwell Birthdate: 05/09/1941 Sex: female Admission Date (Current Location): 08/29/2024  [facility] and [place] Number:  Chiropodist and Address:  [facility], [address], [place], [place] [address]      Provider Number: [id number]  Attending Physician Name and Address:  Marsa Edelman, DO  Relative Name and Phone Number:  Olga Bourbeau [phone]    Current Level of Care: Hospital Recommended Level of Care: Skilled Nursing Facility Prior Approval Number:    Date Approved/Denied:   PASRR Number: [id number]  Discharge Plan: SNF    Current Diagnoses: Patient Active Problem List   Diagnosis Date Noted   Hypoxia 08/29/2024   Thoracic kyphosis 05/18/2024   Significant disease of coronary bypass graft 05/18/2024   Seasonal allergic rhinitis 05/18/2024   Pure hypercholesterolemia 05/18/2024   Primary hyperparathyroidism 05/18/2024   Osteopenia 05/18/2024   Mycobacterium avium-intracellulare infection (HCC) 05/18/2024   Localized primary osteoarthritis of wrist 05/18/2024   Chronic kidney disease due to hypertension 05/18/2024   Atherosclerosis of aorta 05/18/2024   Adenomatous polyp of colon 05/18/2024   Chronic heart failure with preserved ejection fraction (HFpEF) (HCC) 01/13/2024   Afib (HCC) 01/11/2024   Frequent PVCs 11/05/2023   Chronic kidney disease, stage 3a (HCC) 11/05/2023   Thrombocytopenia 11/05/2023   Gallstone 11/05/2023   Pulmonary hypertension (HCC) 06/17/2023   PAC (premature atrial contraction) 09/10/2018   Pinched nerve    Irritable bowel syndrome (IBS)    Hyperlipidemia LDL goal <70    GERD (gastroesophageal reflux disease)    Emphysema lung (HCC)    DDD (degenerative disc disease), lumbosacral    Arthritis    Allergy    Labile hypertension    Primary localized osteoarthritis of right hip 08/05/2016   Primary  osteoarthritis of right hip 08/05/2016   Coronary artery disease 05/01/2015   Essential hypertension 05/01/2015   Dyslipidemia 05/01/2015   Right lower lobe lung mass 12/14/2014   COPD (chronic obstructive pulmonary disease) (HCC) 12/14/2014   History of breast cancer 07/25/2011   Diverticulosis of colon 09/23/2010    Orientation RESPIRATION BLADDER Height & Weight     Time, Situation, Place, Self  Normal Continent Weight: 48.4 kg Height:  5' 5 (165.1 cm)  BEHAVIORAL SYMPTOMS/MOOD NEUROLOGICAL BOWEL NUTRITION STATUS      Continent Diet (regular)  AMBULATORY STATUS COMMUNICATION OF NEEDS Skin   Limited Assist Verbally Normal                       Personal Care Assistance Level of Assistance  Bathing, Dressing Bathing Assistance: Limited assistance   Dressing Assistance: Limited assistance     Functional Limitations Info             SPECIAL CARE FACTORS FREQUENCY  PT (By licensed PT), OT (By licensed OT)     PT Frequency: 5x week OT Frequency: 5x week            Contractures Contractures Info: Not present    Additional Factors Info  Code Status, Allergies Code Status Info: Full Allergies Info: Amlodipine , Amoxicillin, Hydromorphone , Morphine , Azithromycin, Ciprofloxacin, Hydromorphone  Hcl, Levofloxacin , Lisinopril           Current Medications (08/31/2024):  This is the current hospital active medication list Current Facility-Administered Medications  Medication Dose Route Frequency Provider Last Rate Last Admin   acetaminophen  (TYLENOL ) tablet 650 mg  650 mg Oral Q6H  PRN Lenon Marien CROME, MD       Or   acetaminophen  (TYLENOL ) suppository 650 mg  650 mg Rectal Q6H PRN Lenon Marien CROME, MD       albuterol  (PROVENTIL ) (2.5 MG/3ML) 0.083% nebulizer solution 2.5 mg  2.5 mg Nebulization Q2H PRN Anderson, Chelsey L, MD       aspirin  EC tablet 81 mg  81 mg Oral Daily Lenon Marien CROME, MD   81 mg at 08/31/24 1015   atorvastatin  (LIPITOR) tablet 20  mg  20 mg Oral Daily Lenon Marien CROME, MD   20 mg at 08/31/24 1015   bisacodyl  (DULCOLAX) EC tablet 5 mg  5 mg Oral Daily PRN Lenon Marien CROME, MD       carvedilol  (COREG ) tablet 6.25 mg  6.25 mg Oral BID WC Lenon Marien CROME, MD   6.25 mg at 08/31/24 1015   enoxaparin  (LOVENOX ) injection 40 mg  40 mg Subcutaneous Q24H Lenon Marien CROME, MD   40 mg at 08/31/24 1530   feeding supplement (ENSURE PLUS HIGH PROTEIN) liquid 237 mL  237 mL Oral BID BM Lenon Marien CROME, MD   237 mL at 08/31/24 1530   furosemide  (LASIX ) tablet 20 mg  20 mg Oral Daily Ayiku, Bernard, MD   20 mg at 08/31/24 1015   ipratropium-albuterol  (DUONEB) 0.5-2.5 (3) MG/3ML nebulizer solution 3 mL  3 mL Nebulization Q6H PRN Lenon Marien CROME, MD       ondansetron  (ZOFRAN ) tablet 4 mg  4 mg Oral Q6H PRN Lenon Marien CROME, MD       Or   ondansetron  (ZOFRAN ) injection 4 mg  4 mg Intravenous Q6H PRN Anderson, Chelsey L, MD       polyethylene glycol (MIRALAX  / GLYCOLAX ) packet 17 g  17 g Oral Daily Lenon Marien CROME, MD   17 g at 08/31/24 1015   umeclidinium-vilanterol (ANORO ELLIPTA ) 62.5-25 MCG/ACT 1 puff  1 puff Inhalation Daily Lenon Marien CROME, MD   1 puff at 08/31/24 1015     Discharge Medications: Please see discharge summary for a list of discharge medications.  Relevant Imaging Results:  Relevant Lab Results:   Additional Information SS# [id number]  Daved JONETTA Hamilton, RN

## 2024-08-31 NOTE — Progress Notes (Signed)
 SATURATION QUALIFICATIONS: (This note is used to comply with regulatory documentation for home oxygen )  Patient Saturations on Room Air at Rest = 94%  Patient Saturations on Room Air while Ambulating = 90%  Patient Saturations on 0 Liters of oxygen  while Ambulating = 90%  Please briefly explain why patient needs home oxygen : Patient has to stop approximately every 40 feet to rest and deep breathe.

## 2024-08-31 NOTE — Hospital Course (Addendum)
 Hospital course / significant events:   Tiffany Caldwell is a 83 y.o. female  with PMH significant for hypertension, hyperlipidemia, COPD, CAD s/p PCI and CABG , diastolic CHF, CKD-3A, thrombocytopenia, pulmonary MAI, IBS, gallstone, PVC/PAC, breast cancer.  She lives at [facility].  She presented to the ED because of shortness of breath and easy fatigability   HPI: symptoms about 2 days duration. She endorsed that she has chronic shortness of breath at baseline but this has worsened considerably in the last few days. She thinks she felt hot the night prior to admission. She normally uses 2 to 3 L oxygen  at night at baseline.   11/24: to ED. Admitted to hospitalist. IV diuresis 11/25: doing well and back onto po diuretics. PT/OT recs for SNF rehab  11/26: euvolemic, still on O2 around-the-clock (baseline just at night), walk test for ox sats pending. Approaching medical stability, pending auth for [facility] SNF  11/27: stable, mild alkalosis likely volume contraction will plan follow BMP / reduce lasix  tomorrow 11/28: stable. Peer to peer review w/ Dr Janit - Called 09/02/24 3:39 PM - Pt is in a grey area - yes dementia and worsening memory, there is chance pt will not be able to return to previous level of function, but I believe patient has some capacity to improve w/ rehab to try to improve conditioning and we should give rehab a trial run. Rehab approved with the understanding this is a trial and rehab may not be an option in the future if pt continues to decline. I discussed this w/ pt's son who is understanding of the situation.  11/29: ***    Consultants:  none  Procedures/Surgeries: none      ASSESSMENT & PLAN:   Acute on chronic CHF with preserved EF Oral Lasix   Euvolemic at this time, monitor I&O (under-documented)  Continue aspirin , statin, beta blocker, and added ARB   Coronary Artery Disease without Acute Coronary Syndrome Mildly elevated troponins: due to demand  ischemia. Continue aspirin , statin, beta blocker, and added ARB   COPD Acute on chronic hypoxic respiratory failure  O2 walk test see if need supplemental O2 around-the-clock Continue bronchodilators   Question contraction alkalosis d/t diuresis Monitor BMP Consider hold lasix  or use prn rather than scheduled  Hypertension Continue carvedilol  - hold current dose d/t bordelrine low HR Added losartan today d/t BP staying high    HLD Statin  CKD3a Monitor BMP  General weakness:  PT recommended discharge to SNF.  Follow-up with TOC to assist with disposition.   Dementia, mild Wax/wane confusion, no agitation  Dementia and worsening memory, there is chance pt will not be able to return to previous level of function, but I believe patient has some capacity to improve w/ rehab to hopefully improve conditioning  Rehab approved (see hospital course re: prior auth) with the understanding this is a trial and rehab may not be an option in the future if pt continues to decline.  I discussed this w/ pt's son who is understanding of the situation.      Underweight based on BMI: Body mass index is 17.76 kg/m.SABRA Significantly low or high BMI is associated with higher medical risk.  Underweight - under 18  overweight - 25 to 29 obese - 30 or more Class 1 obesity: BMI of 30.0 to 34 Class 2 obesity: BMI of 35.0 to 39 Class 3 obesity: BMI of 40.0 to 49 Super Morbid Obesity: BMI 50-59 Super-super Morbid Obesity: BMI 60+ Healthy nutrition  and physical activity advised as adjunct to other disease management and risk reduction treatments    DVT prophylaxis: lovenox  IV fluids: no continuous IV fluids  Nutrition: regular Central lines / other devices: none  Code Status: FULL CODE ACP documentation reviewed: MOST is c/w current treatment pan, is on file in [facility]  [facility] needs: SNF auth Medical barriers to dispo: diuresing to po . Expected medical readiness for discharge tomorrow.

## 2024-08-31 NOTE — Plan of Care (Signed)

## 2024-09-01 DIAGNOSIS — R0902 Hypoxemia: Secondary | ICD-10-CM | POA: Diagnosis not present

## 2024-09-01 LAB — BASIC METABOLIC PANEL WITH GFR
Anion gap: 7 (ref 5–15)
BUN: 25 mg/dL — ABNORMAL HIGH (ref 8–23)
CO2: 33 mmol/L — ABNORMAL HIGH (ref 22–32)
Calcium: 9.7 mg/dL (ref 8.9–10.3)
Chloride: 96 mmol/L — ABNORMAL LOW (ref 98–111)
Creatinine, Ser: 0.92 mg/dL (ref 0.44–1.00)
GFR, Estimated: >60 mL/min (ref >60)
Glucose, Bld: 99 mg/dL (ref 70–99)
Potassium: 3.6 mmol/L (ref 3.5–5.1)
Sodium: 136 mmol/L (ref 135–145)

## 2024-09-01 MED ORDER — FUROSEMIDE 20 MG PO TABS
10.0000 mg | ORAL_TABLET | Freq: Every day | ORAL | Status: DC
  Administered 2024-09-02 – 2024-09-03 (×2): 10 mg via ORAL
  Filled 2024-09-01 (×2): qty 1

## 2024-09-01 NOTE — Progress Notes (Signed)
 Mobility Specialist - Progress Note    09/01/24 1000  Mobility  Activity Ambulated with assistance;Stood at bedside  Level of Assistance Standby assist, set-up cues, supervision of patient - no hands on  Assistive Device Front wheel walker  Distance Ambulated (ft) 90 ft  Range of Motion/Exercises Active  Activity Response Tolerated well  Mobility Referral Yes  Mobility visit 1 Mobility  Mobility Specialist Start Time (ACUTE ONLY) 1015  Mobility Specialist Stop Time (ACUTE ONLY) 1031  Mobility Specialist Time Calculation (min) (ACUTE ONLY) 16 min   Pt resting in bed on RA upon entry. Pt STS and ambulates to hallway around NS SBA with RW. Pt returned to recliner and left with needs in reach. Chair alarm activated.   Guido Rumble Mobility Specialist 09/01/24, 11:06 AM

## 2024-09-01 NOTE — Plan of Care (Signed)

## 2024-09-01 NOTE — Progress Notes (Addendum)
 PROGRESS NOTE    Tiffany Caldwell   FMW:[id number] DOB: [birth year]  DOA: 08/29/2024 Date of Service: 09/01/24 which is hospital day 0  PCP: Gil Greig BRAVO, NP    Hospital course / significant events:   Tiffany Caldwell is a 83 y.o. female  with PMH significant for hypertension, hyperlipidemia, COPD, CAD s/p PCI and CABG , diastolic CHF, CKD-3A, thrombocytopenia, pulmonary MAI, IBS, gallstone, PVC/PAC, breast cancer.  She lives at [facility]  She presented to the ED because of shortness of breath and easy fatigability   HPI: symptoms about 2 days duration. She endorsed that she has chronic shortness of breath at baseline but this has worsened considerably in the last few days. She thinks she felt hot the night prior to admission. She normally uses 2 to 3 L oxygen  at night at baseline.   11/24: to ED. Admitted to hospitalist. IV diuresis 11/25: doing well and back onto po diuretics. PT/OT recs for SNF rehab  11/26: euvolemic, still on O2 around-the-clock (baseline just at night), walk test for ox sats pending. Approaching medical stability, pending auth for [facility] SNF  11/27: stable, mild alkalosis likely volume contraction will plan follow BMP / reduce lasix  tomorrow     Consultants:  none  Procedures/Surgeries: none      ASSESSMENT & PLAN:   Acute on chronic CHF with preserved EF Oral Lasix   Euvolemic at this time, monitor I&O (under-documented)    Coronary Artery Disease without Acute Coronary Syndrome Mildly elevated troponins: due to demand ischemia. Continue aspirin , statin, beta blocker    COPD Acute on chronic hypoxic respiratory failure  O2 walk test see if need supplemental O2 around-the-clock Continue bronchodilators   Question contraction alkalosis d/t diuresis Monitor BMP Consider hold lasix  or use prn rather than scheduled  Hypertension Continue carvedilol    HLD Statin  CKD3a Monitor BMP  General weakness:  PT recommended discharge  to SNF.  Follow-up with TOC to assist with disposition.         Underweight based on BMI: Body mass index is 17.76 kg/m.SABRA Significantly low or high BMI is associated with higher medical risk.  Underweight - under 18  overweight - 25 to 29 obese - 30 or more Class 1 obesity: BMI of 30.0 to 34 Class 2 obesity: BMI of 35.0 to 39 Class 3 obesity: BMI of 40.0 to 49 Super Morbid Obesity: BMI 50-59 Super-super Morbid Obesity: BMI 60+ Healthy nutrition and physical activity advised as adjunct to other disease management and risk reduction treatments    DVT prophylaxis: lovenox  IV fluids: no continuous IV fluids  Nutrition: regular Central lines / other devices: none  Code Status: FULL CODE ACP documentation reviewed: MOST is c/w current treatment pan, is on file in [facility]  [facility] needs: SNF auth Medical barriers to dispo: diuresing to po . Expected medical readiness for discharge tomorrow.              Subjective / Brief ROS:  Patient reports feeling improved today still tired but walking a bit more  Denies CP/SOB at rest but has to stop to catch breath after walking some distance  Pain controlled.  Denies new weakness.  Tolerating diet.  Reports no concerns w/ urination/defecation.   Family Communication: none at bedside. Pt request call to son - I spoke on phone w/ him 09/01/24 2:50 PM all questions answered     Objective Findings:  Vitals:   08/31/24 1608 08/31/24 2034 09/01/24 0357 09/01/24 0757  BP: ROLLEN)  133/94 (!) 141/73 (!) 140/59 (!) 162/78  Pulse: 64 61 (!) 57 (!) 55  Resp: 20   20  Temp: 98.3 F (36.8 C) 97.7 F (36.5 C) (!) 97.4 F (36.3 C) 97.8 F (36.6 C)  TempSrc: Oral  Oral   SpO2: 99% 94% 92% 93%  Weight:   47.9 kg   Height:        Intake/Output Summary (Last 24 hours) at 09/01/2024 1450 Last data filed at 09/01/2024 0900 Gross per 24 hour  Intake 360 ml  Output --  Net 360 ml   Filed Weights   08/30/24 0505 08/31/24 0500 09/01/24  0357  Weight: 48.2 kg 48.4 kg 47.9 kg    Examination:  Physical Exam Constitutional:      General: She is not in acute distress. Cardiovascular:     Rate and Rhythm: Normal rate and regular rhythm.  Pulmonary:     Effort: Pulmonary effort is normal.     Breath sounds: No wheezing, rhonchi or rales.  Musculoskeletal:     Right lower leg: No edema.     Left lower leg: No edema.  Skin:    General: Skin is warm and dry.  Neurological:     Mental Status: She is alert and oriented to person, place, and time.  Psychiatric:        Mood and Affect: Mood normal.        Behavior: Behavior normal.          Scheduled Medications:   aspirin  EC  81 mg Oral Daily   atorvastatin   20 mg Oral Daily   carvedilol   6.25 mg Oral BID WC   enoxaparin  (LOVENOX ) injection  40 mg Subcutaneous Q24H   feeding supplement  237 mL Oral BID BM   [START ON 09/02/2024] furosemide   10 mg Oral Daily   polyethylene glycol  17 g Oral Daily   umeclidinium-vilanterol  1 puff Inhalation Daily    Continuous Infusions:   PRN Medications:  acetaminophen  **OR** acetaminophen , albuterol , bisacodyl , ipratropium-albuterol , ondansetron  **OR** ondansetron  (ZOFRAN ) IV  Antimicrobials from admission:  Anti-infectives (From admission, onward)    None           Data Reviewed:  I have personally reviewed the following...  CBC: Recent Labs  Lab 08/29/24 1127  WBC 8.0  NEUTROABS 6.6  HGB 13.6  HCT 41.1  MCV 100.2*  PLT 114*   Basic Metabolic Panel: Recent Labs  Lab 08/29/24 1311 08/30/24 0446 09/01/24 0610  NA 137 135 136  K 4.5 3.5 3.6  CL 101 94* 96*  CO2 28 30 33*  GLUCOSE 114* 108* 99  BUN 18 19 25*  CREATININE 0.75 0.91 0.92  CALCIUM  10.2 10.2 9.7   GFR: Estimated Creatinine Clearance: 33.8 mL/min (by C-G formula based on SCr of 0.92 mg/dL). Liver Function Tests: Recent Labs  Lab 08/29/24 1311  AST 28  ALT 11  ALKPHOS 97  BILITOT 0.9  PROT 6.7  ALBUMIN 3.4*   No  results for input(s): LIPASE, AMYLASE in the last 168 hours. No results for input(s): AMMONIA in the last 168 hours. Coagulation Profile: No results for input(s): INR, PROTIME in the last 168 hours. Cardiac Enzymes: No results for input(s): CKTOTAL, CKMB, CKMBINDEX, TROPONINI in the last 168 hours. BNP (last 3 results) Recent Labs    08/29/24 1311  PROBNP 3,179.0*   HbA1C: No results for input(s): HGBA1C in the last 72 hours. CBG: No results for input(s): GLUCAP in the last 168 hours. Lipid  Profile: No results for input(s): CHOL, HDL, LDLCALC, TRIG, CHOLHDL, LDLDIRECT in the last 72 hours. Thyroid  Function Tests: No results for input(s): TSH, T4TOTAL, FREET4, T3FREE, THYROIDAB in the last 72 hours. Anemia Panel: No results for input(s): VITAMINB12, FOLATE, FERRITIN, TIBC, IRON, RETICCTPCT in the last 72 hours. Most Recent Urinalysis On File:     Component Value Date/Time   COLORURINE YELLOW (A) 08/29/2024 1448   APPEARANCEUR CLEAR (A) 08/29/2024 1448   LABSPEC 1.009 08/29/2024 1448   PHURINE 7.0 08/29/2024 1448   GLUCOSEU NEGATIVE 08/29/2024 1448   HGBUR NEGATIVE 08/29/2024 1448   BILIRUBINUR NEGATIVE 08/29/2024 1448   KETONESUR NEGATIVE 08/29/2024 1448   PROTEINUR NEGATIVE 08/29/2024 1448   NITRITE NEGATIVE 08/29/2024 1448   LEUKOCYTESUR NEGATIVE 08/29/2024 1448   Sepsis Labs: @LABRCNTIP (procalcitonin:4,lacticidven:4) Microbiology: Recent Results (from the past 240 hours)  Resp panel by RT-PCR (RSV, Flu A&B, Covid) Anterior Nasal Swab     Status: None   Collection Time: 08/29/24 11:52 AM   Specimen: Anterior Nasal Swab  Result Value Ref Range Status   SARS Coronavirus 2 by RT PCR NEGATIVE NEGATIVE Final    Comment: (NOTE) SARS-CoV-2 target nucleic acids are NOT DETECTED.  The SARS-CoV-2 RNA is generally detectable in upper respiratory specimens during the acute phase of infection. The lowest concentration of  SARS-CoV-2 viral copies this assay can detect is 138 copies/mL. A negative result does not preclude SARS-Cov-2 infection and should not be used as the sole basis for treatment or other patient management decisions. A negative result may occur with  improper specimen collection/handling, submission of specimen other than nasopharyngeal swab, presence of viral mutation(s) within the areas targeted by this assay, and inadequate number of viral copies(<138 copies/mL). A negative result must be combined with clinical observations, patient history, and epidemiological information. The expected result is Negative.  Fact Sheet for Patients:  bloggercourse.com  Fact Sheet for Healthcare Providers:  seriousbroker.it  This test is no t yet approved or cleared by the [place]  FDA and  has been authorized for detection and/or diagnosis of SARS-CoV-2 by FDA under an Emergency Use Authorization (EUA). This EUA will remain  in effect (meaning this test can be used) for the duration of the COVID-19 declaration under Section 564(b)(1) of the Act, 21 U.S.C.section 360bbb-3(b)(1), unless the authorization is terminated  or revoked sooner.       Influenza A by PCR NEGATIVE NEGATIVE Final   Influenza B by PCR NEGATIVE NEGATIVE Final    Comment: (NOTE) The Xpert Xpress SARS-CoV-2/FLU/RSV plus assay is intended as an aid in the diagnosis of influenza from Nasopharyngeal swab specimens and should not be used as a sole basis for treatment. Nasal washings and aspirates are unacceptable for Xpert Xpress SARS-CoV-2/FLU/RSV testing.  Fact Sheet for Patients: bloggercourse.com  Fact Sheet for Healthcare Providers: seriousbroker.it  This test is not yet approved or cleared by the [place]  FDA and has been authorized for detection and/or diagnosis of SARS-CoV-2 by FDA under an Emergency Use  Authorization (EUA). This EUA will remain in effect (meaning this test can be used) for the duration of the COVID-19 declaration under Section 564(b)(1) of the Act, 21 U.S.C. section 360bbb-3(b)(1), unless the authorization is terminated or revoked.     Resp Syncytial Virus by PCR NEGATIVE NEGATIVE Final    Comment: (NOTE) Fact Sheet for Patients: bloggercourse.com  Fact Sheet for Healthcare Providers: seriousbroker.it  This test is not yet approved or cleared by the [place]  FDA and has been authorized for detection  and/or diagnosis of SARS-CoV-2 by FDA under an Emergency Use Authorization (EUA). This EUA will remain in effect (meaning this test can be used) for the duration of the COVID-19 declaration under Section 564(b)(1) of the Act, 21 U.S.C. section 360bbb-3(b)(1), unless the authorization is terminated or revoked.  Performed at [facility], [address] [place], [place] [address]   Respiratory (~20 pathogens) panel by PCR     Status: None   Collection Time: 08/29/24  3:40 PM   Specimen: Nasopharyngeal Swab; Respiratory  Result Value Ref Range Status   Adenovirus NOT DETECTED NOT DETECTED Final   Coronavirus 229E NOT DETECTED NOT DETECTED Final    Comment: (NOTE) The Coronavirus on the Respiratory Panel, DOES NOT test for the novel  Coronavirus (2019 nCoV)    Coronavirus HKU1 NOT DETECTED NOT DETECTED Final   Coronavirus NL63 NOT DETECTED NOT DETECTED Final   Coronavirus OC43 NOT DETECTED NOT DETECTED Final   Metapneumovirus NOT DETECTED NOT DETECTED Final   Rhinovirus / Enterovirus NOT DETECTED NOT DETECTED Final   Influenza A NOT DETECTED NOT DETECTED Final   Influenza B NOT DETECTED NOT DETECTED Final   Parainfluenza Virus 1 NOT DETECTED NOT DETECTED Final   Parainfluenza Virus 2 NOT DETECTED NOT DETECTED Final   Parainfluenza Virus 3 NOT DETECTED NOT DETECTED Final   Parainfluenza Virus 4 NOT  DETECTED NOT DETECTED Final   Respiratory Syncytial Virus NOT DETECTED NOT DETECTED Final   Bordetella pertussis NOT DETECTED NOT DETECTED Final   Bordetella Parapertussis NOT DETECTED NOT DETECTED Final   Chlamydophila pneumoniae NOT DETECTED NOT DETECTED Final   Mycoplasma pneumoniae NOT DETECTED NOT DETECTED Final    Comment: Performed at [facility], [address] [place], [place] [address]      Radiology Studies last 3 days: DG Chest 2 View Result Date: 08/29/2024 EXAM: 2 VIEW(S) XRAY OF THE CHEST 08/29/2024 11:54:45 AM COMPARISON: 03/15/2024 CLINICAL HISTORY: SOB (shortness of breath) FINDINGS: LUNGS AND PLEURA: The chin overlies the apices on the frontal radiograph. Lower lung predominant interstitial prominence and indistinctness. No lobar consolidation. No pleural effusion. No pneumothorax. HEART AND MEDIASTINUM: Moderate cardiomegaly. Transverse aortic atherosclerosis. BONES AND SOFT TISSUES: Median sternotomy for CABG. Left axillary node dissection. No acute osseous abnormality. IMPRESSION: 1. Cardiomegaly, moderate, without lobar consolidation. 2. Lower lung predominant interstitial prominence and indistinctness, likely interstitial edema. Electronically signed by: Rockey Kilts MD 08/29/2024 12:22 PM EST RP Workstation: [id number]          Laneta Blunt, DO [facility] 09/01/2024, 2:50 PM    Dictation software may have been used to generate the above note. Typos may occur and escape review in typed/dictated notes. Please contact Dr Blunt directly for clarity if needed.  Staff may message me via secure chat in Epic  but this may not receive an immediate response,  please page me for urgent matters!  If 7PM-7AM, please contact night coverage [email or web address]

## 2024-09-01 NOTE — TOC Progression Note (Signed)
 Transition of Care [facility]) - Progression Note    Patient Details  Name: Tiffany Caldwell MRN: [id number] Date of Birth: May 16, 1941  Transition of Care [facility]) CM/SW Contact  [name] [name], [place] Phone Number: 09/01/2024, 9:30 AM  Clinical Narrative:     CSW followed up with [employer] regarding SNF authorization. CSW spoke with Madelin Browner. Insurance auth initiated for SNF and ambulance.   Expected Discharge Plan: Skilled Nursing Facility Barriers to Discharge: Insurance Authorization               Expected Discharge Plan and Services   Discharge Planning Services: CM Consult   Living arrangements for the past 2 months: Independent Living Facility                                       Social Drivers of Health (SDOH) Interventions SDOH Screenings   Food Insecurity: No Food Insecurity (08/29/2024)  Housing: Low Risk  (08/29/2024)  Transportation Needs: No Transportation Needs (08/29/2024)  Utilities: Not At Risk (08/29/2024)  Depression (PHQ2-9): Low Risk  (08/03/2024)  Financial Resource Strain: Low Risk  (05/17/2024)  Physical Activity: Inactive (05/17/2024)  Social Connections: Socially Integrated (08/29/2024)  Stress: No Stress Concern Present (05/17/2024)  Tobacco Use: Medium Risk (08/29/2024)    Readmission Risk Interventions     No data to display

## 2024-09-02 DIAGNOSIS — R0902 Hypoxemia: Secondary | ICD-10-CM | POA: Diagnosis not present

## 2024-09-02 LAB — BASIC METABOLIC PANEL WITH GFR
Anion gap: 10 (ref 5–15)
BUN: 26 mg/dL — ABNORMAL HIGH (ref 8–23)
CO2: 30 mmol/L (ref 22–32)
Calcium: 10.1 mg/dL (ref 8.9–10.3)
Chloride: 97 mmol/L — ABNORMAL LOW (ref 98–111)
Creatinine, Ser: 0.9 mg/dL (ref 0.44–1.00)
GFR, Estimated: >60 mL/min (ref >60)
Glucose, Bld: 99 mg/dL (ref 70–99)
Potassium: 3.7 mmol/L (ref 3.5–5.1)
Sodium: 137 mmol/L (ref 135–145)

## 2024-09-02 MED ORDER — LOSARTAN POTASSIUM 25 MG PO TABS
25.0000 mg | ORAL_TABLET | Freq: Every day | ORAL | Status: DC
  Administered 2024-09-02 – 2024-09-03 (×2): 25 mg via ORAL
  Filled 2024-09-02 (×2): qty 1

## 2024-09-02 NOTE — TOC Progression Note (Addendum)
 Transition of Care [facility]) - Progression Note    Patient Details  Name: Tiffany Caldwell MRN: [id number] Date of Birth: 13-Aug-1941  Transition of Care [facility]) CM/SW Contact  Daved JONETTA Hamilton, RN Phone Number: 09/02/2024, 7:32 PM  Clinical Narrative:     Late Entry:  TOC notified by doctor that P2P completed and SNF-STR approved. This CM received VM from [facility]TA, STR approved for 7 days for [facility], must admit to facility w/in 5 days, Auth# [id number]. [facility] transport Auth# [id number]  Information sent to [facility] via [facility]. TOC will need to contact [facility] for bed availability date. TOC will need to notify patient's spouse.   Expected Discharge Plan: Skilled Nursing Facility Barriers to Discharge: Insurance Authorization               Expected Discharge Plan and Services   Discharge Planning Services: CM Consult   Living arrangements for the past 2 months: Independent Living Facility                                       Social Drivers of Health (SDOH) Interventions SDOH Screenings   Food Insecurity: No Food Insecurity (08/29/2024)  Housing: Low Risk  (08/29/2024)  Transportation Needs: No Transportation Needs (08/29/2024)  Utilities: Not At Risk (08/29/2024)  Depression (PHQ2-9): Low Risk  (08/03/2024)  Financial Resource Strain: Low Risk  (05/17/2024)  Physical Activity: Inactive (05/17/2024)  Social Connections: Socially Integrated (08/29/2024)  Stress: No Stress Concern Present (05/17/2024)  Tobacco Use: Medium Risk (08/29/2024)    Readmission Risk Interventions     No data to display

## 2024-09-02 NOTE — Plan of Care (Signed)
  Problem: Clinical Measurements: Goal: Ability to maintain clinical measurements within normal limits will improve Outcome: Progressing   Problem: Elimination: Goal: Will not experience complications related to bowel motility Outcome: Progressing   Problem: Pain Managment: Goal: General experience of comfort will improve and/or be controlled Outcome: Progressing   Problem: Safety: Goal: Ability to remain free from injury will improve Outcome: Progressing   Problem: Coping: Goal: Level of anxiety will decrease Outcome: Progressing

## 2024-09-02 NOTE — Progress Notes (Signed)
 Physical Therapy Treatment Patient Details Name: Tiffany Caldwell MRN: [id number] DOB: 1940-11-21 Today's Date: 09/02/2024   History of Present Illness Patient is a 83 year old female with shortness of breath, acute on chronic hypoxic respiratory failure. PMH: hypertension, hyperlipidemia, COPD, CAD s/p PCI and CABG , diastolic CHF, CKD-3A, thrombocytopenia, pulmonary MAI, IBS, gallstone, PVC/PAC, breast cancer.    PT Comments  Patient received with RN present in room and ambulating into bathroom. She is agreeable to PT session as tolerated. Patient able to stand from commode with supervision. Ambulated in room ~15 feet with RW, but verbalized she was too fatigued to continue ambulating out into hallway. Patient sob after returning to recliner, O2 sats at 93% on room air. She will continue to benefit from skilled PT to improve strength, endurance and safety with mobility.       If plan is discharge home, recommend the following: A lot of help with walking and/or transfers;A lot of help with bathing/dressing/bathroom   Can travel by private vehicle     Yes  Equipment Recommendations  None recommended by PT    Recommendations for Other Services       Precautions / Restrictions Precautions Precautions: Fall Recall of Precautions/Restrictions: Impaired Restrictions Weight Bearing Restrictions Per Provider Order: No     Mobility  Bed Mobility               General bed mobility comments: NT patient received up with RN ambulating to bathroom    Transfers Overall transfer level: Modified independent Equipment used: Rolling walker (2 wheels)   Sit to Stand: Modified independent (Device/Increase time)                Ambulation/Gait Ambulation/Gait assistance: Contact guard assist, Min assist Gait Distance (Feet): 20 Feet Assistive device: Rolling walker (2 wheels) Gait Pattern/deviations: Step-through pattern, Decreased step length - right, Decreased step length -  left, Decreased stride length, Trunk flexed Gait velocity: decreased     General Gait Details: patient limited by fatigue, sob. She is unable to ambulate out into hallway due to fatigue.   Stairs             Wheelchair Mobility     Tilt Bed    Modified Rankin (Stroke Patients Only)       Balance Overall balance assessment: Needs assistance Sitting-balance support: Feet supported Sitting balance-Leahy Scale: Good     Standing balance support: Bilateral upper extremity supported, During functional activity, Reliant on assistive device for balance Standing balance-Leahy Scale: Fair Standing balance comment: external support required, posterior leaning in standing                            Communication Communication Communication: Impaired Factors Affecting Communication: Hearing impaired;Difficulty expressing self  Cognition Arousal: Alert Behavior During Therapy: WFL for tasks assessed/performed   PT - Cognitive impairments: Memory Difficult to assess due to: Impaired communication, Hard of hearing/deaf                         Following commands impaired: Follows one step commands with increased time, Follows one step commands inconsistently    Cueing Cueing Techniques: Verbal cues  Exercises      General Comments        Pertinent Vitals/Pain Pain Assessment Pain Assessment: No/denies pain    Home Living  Prior Function            PT Goals (current goals can now be found in the care plan section) Acute Rehab PT Goals Patient Stated Goal: improve strength and endurance PT Goal Formulation: With patient Time For Goal Achievement: 09/13/24 Potential to Achieve Goals: Good Progress towards PT goals: Progressing toward goals    Frequency    Min 2X/week      PT Plan      Co-evaluation              AM-PAC PT 6 Clicks Mobility   Outcome Measure  Help needed turning from  your back to your side while in a flat bed without using bedrails?: A Little Help needed moving from lying on your back to sitting on the side of a flat bed without using bedrails?: A Little Help needed moving to and from a bed to a chair (including a wheelchair)?: A Little Help needed standing up from a chair using your arms (e.g., wheelchair or bedside chair)?: A Little Help needed to walk in hospital room?: A Little Help needed climbing 3-5 steps with a railing? : A Lot 6 Click Score: 17    End of Session Equipment Utilized During Treatment: Oxygen ;Gait belt Activity Tolerance: Patient limited by fatigue Patient left: in chair;with call bell/phone within reach;with chair alarm set Nurse Communication: Mobility status PT Visit Diagnosis: Muscle weakness (generalized) (M62.81);Difficulty in walking, not elsewhere classified (R26.2);Unsteadiness on feet (R26.81)     Time: 8850-8799 PT Time Calculation (min) (ACUTE ONLY): 11 min  Charges:    $Gait Training: 8-22 mins PT General Charges $$ ACUTE PT VISIT: 1 Visit                     Christo Hain, PT, GCS 09/02/24,12:28 PM

## 2024-09-02 NOTE — Progress Notes (Signed)
 PROGRESS NOTE    Tiffany Caldwell   FMW:[id number] DOB: 12/31/1940  DOA: 08/29/2024 Date of Service: 09/02/24 which is hospital day 0  PCP: Gil Greig BRAVO, NP    Hospital course / significant events:   Tiffany Caldwell is a 83 y.o. female  with PMH significant for hypertension, hyperlipidemia, COPD, CAD s/p PCI and CABG , diastolic CHF, CKD-3A, thrombocytopenia, pulmonary MAI, IBS, gallstone, PVC/PAC, breast cancer.  [facility] at [facility].  She presented to the ED because of shortness of breath and easy fatigability   HPI: symptoms about 2 days duration. She endorsed that she has chronic shortness of breath at baseline but this has worsened considerably in the last few days. She thinks she felt hot the night prior to admission. She normally uses 2 to 3 L oxygen  at night at baseline.   11/24: to ED. Admitted to hospitalist. IV diuresis 11/25: doing well and back onto po diuretics. PT/OT recs for SNF rehab  11/26: euvolemic, still on O2 around-the-clock (baseline just at night), walk test for ox sats pending. Approaching medical stability, pending auth for [facility] SNF  11/27: stable, mild alkalosis likely volume contraction will plan follow BMP / reduce lasix  tomorrow 11/28: stable.    Peer to peer review Dr Janit - Called 09/02/24 3:39 PM  Pt is in a grey area - yes dementia and worsening memory, there is chance pt will not be able to return to previous level of function, but I believe patient has some capacity to improve w/ rehab to try to improve conditioning and we should give rehab a trial run. Rehab approved with the understanding this is a trial and rehab may not be an option in the future if pt continues to decline.     Consultants:  none  Procedures/Surgeries: none      ASSESSMENT & PLAN:   Acute on chronic CHF with preserved EF Oral Lasix   Euvolemic at this time, monitor I&O (under-documented)  Continue aspirin , statin, beta blocker, and added ARB    Coronary Artery Disease without Acute Coronary Syndrome Mildly elevated troponins: due to demand ischemia. Continue aspirin , statin, beta blocker, and added ARB   COPD Acute on chronic hypoxic respiratory failure  O2 walk test see if need supplemental O2 around-the-clock Continue bronchodilators   Question contraction alkalosis d/t diuresis Monitor BMP Consider hold lasix  or use prn rather than scheduled  Hypertension Continue carvedilol  - hold current dose d/t bordelrine low HR Added losartan today d/t BP staying high    HLD Statin  CKD3a Monitor BMP  General weakness:  PT recommended discharge to SNF.  Follow-up with TOC to assist with disposition.         Underweight based on BMI: Body mass index is 17.76 kg/m.SABRA Significantly low or high BMI is associated with higher medical risk.  Underweight - under 18  overweight - 25 to 29 obese - 30 or more Class 1 obesity: BMI of 30.0 to 34 Class 2 obesity: BMI of 35.0 to 39 Class 3 obesity: BMI of 40.0 to 49 Super Morbid Obesity: BMI 50-59 Super-super Morbid Obesity: BMI 60+ Healthy nutrition and physical activity advised as adjunct to other disease management and risk reduction treatments    DVT prophylaxis: lovenox  IV fluids: no continuous IV fluids  Nutrition: regular Central lines / other devices: none  Code Status: FULL CODE ACP documentation reviewed: MOST is c/w current treatment pan, is on file in [facility]  [facility] needs: SNF auth Medical barriers to  dispo: diuresing to po . Expected medical readiness for discharge tomorrow.              Subjective / Brief ROS:  Patient reports feeling improved today still tired but walking a bit more.  Denies CP/SOB at rest but has to stop to catch breath after walking some distance  Pain controlled.  Denies new weakness.  Tolerating diet.  Reports no concerns w/ urination/defecation.    Family Communication: none at bedside. Pt request call to son - I spoke on  phone w/ son 09/02/24 3:50 PM all questions answered     Objective Findings:  Vitals:   09/02/24 0328 09/02/24 0438 09/02/24 0801 09/02/24 1223  BP: (!) 169/87  (!) 166/84   Pulse: 78  67   Resp: 16  18   Temp: 98.3 F (36.8 C)  97.6 F (36.4 C)   TempSrc:      SpO2: 92%  98% 93%  Weight:  48.1 kg    Height:        Intake/Output Summary (Last 24 hours) at 09/02/2024 1550 Last data filed at 09/02/2024 1300 Gross per 24 hour  Intake 180 ml  Output --  Net 180 ml   Filed Weights   08/31/24 0500 09/01/24 0357 09/02/24 0438  Weight: 48.4 kg 47.9 kg 48.1 kg    Examination:  Physical Exam Constitutional:      General: She is not in acute distress. Cardiovascular:     Rate and Rhythm: Normal rate and regular rhythm.  Pulmonary:     Effort: Pulmonary effort is normal.     Breath sounds: No wheezing, rhonchi or rales.  Musculoskeletal:     Right lower leg: No edema.     Left lower leg: No edema.  Skin:    General: Skin is warm and dry.  Neurological:     Mental Status: She is alert and oriented to person, place, and time.  Psychiatric:        Mood and Affect: Mood normal.        Behavior: Behavior normal.          Scheduled Medications:   aspirin  EC  81 mg Oral Daily   atorvastatin   20 mg Oral Daily   carvedilol   6.25 mg Oral BID WC   enoxaparin  (LOVENOX ) injection  40 mg Subcutaneous Q24H   feeding supplement  237 mL Oral BID BM   furosemide   10 mg Oral Daily   losartan  25 mg Oral Daily   polyethylene glycol  17 g Oral Daily   umeclidinium-vilanterol  1 puff Inhalation Daily    Continuous Infusions:   PRN Medications:  acetaminophen  **OR** acetaminophen , albuterol , bisacodyl , ipratropium-albuterol , ondansetron  **OR** ondansetron  (ZOFRAN ) IV  Antimicrobials from admission:  Anti-infectives (From admission, onward)    None           Data Reviewed:  I have personally reviewed the following...  CBC: Recent Labs  Lab 08/29/24 1127   WBC 8.0  NEUTROABS 6.6  HGB 13.6  HCT 41.1  MCV 100.2*  PLT 114*   Basic Metabolic Panel: Recent Labs  Lab 08/29/24 1311 08/30/24 0446 09/01/24 0610 09/02/24 0543  NA 137 135 136 137  K 4.5 3.5 3.6 3.7  CL 101 94* 96* 97*  CO2 28 30 33* 30  GLUCOSE 114* 108* 99 99  BUN 18 19 25* 26*  CREATININE 0.75 0.91 0.92 0.90  CALCIUM  10.2 10.2 9.7 10.1   GFR: Estimated Creatinine Clearance: 34.7 mL/min (by C-G  formula based on SCr of 0.9 mg/dL). Liver Function Tests: Recent Labs  Lab 08/29/24 1311  AST 28  ALT 11  ALKPHOS 97  BILITOT 0.9  PROT 6.7  ALBUMIN 3.4*   No results for input(s): LIPASE, AMYLASE in the last 168 hours. No results for input(s): AMMONIA in the last 168 hours. Coagulation Profile: No results for input(s): INR, PROTIME in the last 168 hours. Cardiac Enzymes: No results for input(s): CKTOTAL, CKMB, CKMBINDEX, TROPONINI in the last 168 hours. BNP (last 3 results) Recent Labs    08/29/24 1311  PROBNP 3,179.0*   HbA1C: No results for input(s): HGBA1C in the last 72 hours. CBG: No results for input(s): GLUCAP in the last 168 hours. Lipid Profile: No results for input(s): CHOL, HDL, LDLCALC, TRIG, CHOLHDL, LDLDIRECT in the last 72 hours. Thyroid  Function Tests: No results for input(s): TSH, T4TOTAL, FREET4, T3FREE, THYROIDAB in the last 72 hours. Anemia Panel: No results for input(s): VITAMINB12, FOLATE, FERRITIN, TIBC, IRON, RETICCTPCT in the last 72 hours. Most Recent Urinalysis On File:     Component Value Date/Time   COLORURINE YELLOW (A) 08/29/2024 1448   APPEARANCEUR CLEAR (A) 08/29/2024 1448   LABSPEC 1.009 08/29/2024 1448   PHURINE 7.0 08/29/2024 1448   GLUCOSEU NEGATIVE 08/29/2024 1448   HGBUR NEGATIVE 08/29/2024 1448   BILIRUBINUR NEGATIVE 08/29/2024 1448   KETONESUR NEGATIVE 08/29/2024 1448   PROTEINUR NEGATIVE 08/29/2024 1448   NITRITE NEGATIVE 08/29/2024 1448   LEUKOCYTESUR  NEGATIVE 08/29/2024 1448   Sepsis Labs: @LABRCNTIP (procalcitonin:4,lacticidven:4) Microbiology: Recent Results (from the past 240 hours)  Resp panel by RT-PCR (RSV, Flu A&B, Covid) Anterior Nasal Swab     Status: None   Collection Time: 08/29/24 11:52 AM   Specimen: Anterior Nasal Swab  Result Value Ref Range Status   SARS Coronavirus 2 by RT PCR NEGATIVE NEGATIVE Final    Comment: (NOTE) SARS-CoV-2 target nucleic acids are NOT DETECTED.  The SARS-CoV-2 RNA is generally detectable in upper respiratory specimens during the acute phase of infection. The lowest concentration of SARS-CoV-2 viral copies this assay can detect is 138 copies/mL. A negative result does not preclude SARS-Cov-2 infection and should not be used as the sole basis for treatment or other patient management decisions. A negative result may occur with  improper specimen collection/handling, submission of specimen other than nasopharyngeal swab, presence of viral mutation(s) within the areas targeted by this assay, and inadequate number of viral copies(<138 copies/mL). A negative result must be combined with clinical observations, patient history, and epidemiological information. The expected result is Negative.  Fact Sheet for Patients:  bloggercourse.com  Fact Sheet for Healthcare Providers:  seriousbroker.it  This test is no t yet approved or cleared by the [place]  FDA and  has been authorized for detection and/or diagnosis of SARS-CoV-2 by FDA under an Emergency Use Authorization (EUA). This EUA will remain  in effect (meaning this test can be used) for the duration of the COVID-19 declaration under Section 564(b)(1) of the Act, 21 U.S.C.section 360bbb-3(b)(1), unless the authorization is terminated  or revoked sooner.       Influenza A by PCR NEGATIVE NEGATIVE Final   Influenza B by PCR NEGATIVE NEGATIVE Final    Comment: (NOTE) The Xpert Xpress  SARS-CoV-2/FLU/RSV plus assay is intended as an aid in the diagnosis of influenza from Nasopharyngeal swab specimens and should not be used as a sole basis for treatment. Nasal washings and aspirates are unacceptable for Xpert Xpress SARS-CoV-2/FLU/RSV testing.  Fact Sheet for Patients: bloggercourse.com  Fact Sheet for Healthcare Providers: seriousbroker.it  This test is not yet approved or cleared by the [place]  FDA and has been authorized for detection and/or diagnosis of SARS-CoV-2 by FDA under an Emergency Use Authorization (EUA). This EUA will remain in effect (meaning this test can be used) for the duration of the COVID-19 declaration under Section 564(b)(1) of the Act, 21 U.S.C. section 360bbb-3(b)(1), unless the authorization is terminated or revoked.     Resp Syncytial Virus by PCR NEGATIVE NEGATIVE Final    Comment: (NOTE) Fact Sheet for Patients: bloggercourse.com  Fact Sheet for Healthcare Providers: seriousbroker.it  This test is not yet approved or cleared by the [place]  FDA and has been authorized for detection and/or diagnosis of SARS-CoV-2 by FDA under an Emergency Use Authorization (EUA). This EUA will remain in effect (meaning this test can be used) for the duration of the COVID-19 declaration under Section 564(b)(1) of the Act, 21 U.S.C. section 360bbb-3(b)(1), unless the authorization is terminated or revoked.  Performed at [facility], [address] [place], [place] [address]   Respiratory (~20 pathogens) panel by PCR     Status: None   Collection Time: 08/29/24  3:40 PM   Specimen: Nasopharyngeal Swab; Respiratory  Result Value Ref Range Status   Adenovirus NOT DETECTED NOT DETECTED Final   Coronavirus 229E NOT DETECTED NOT DETECTED Final    Comment: (NOTE) The Coronavirus on the Respiratory Panel, DOES NOT test for the novel   Coronavirus (2019 nCoV)    Coronavirus HKU1 NOT DETECTED NOT DETECTED Final   Coronavirus NL63 NOT DETECTED NOT DETECTED Final   Coronavirus OC43 NOT DETECTED NOT DETECTED Final   Metapneumovirus NOT DETECTED NOT DETECTED Final   Rhinovirus / Enterovirus NOT DETECTED NOT DETECTED Final   Influenza A NOT DETECTED NOT DETECTED Final   Influenza B NOT DETECTED NOT DETECTED Final   Parainfluenza Virus 1 NOT DETECTED NOT DETECTED Final   Parainfluenza Virus 2 NOT DETECTED NOT DETECTED Final   Parainfluenza Virus 3 NOT DETECTED NOT DETECTED Final   Parainfluenza Virus 4 NOT DETECTED NOT DETECTED Final   Respiratory Syncytial Virus NOT DETECTED NOT DETECTED Final   Bordetella pertussis NOT DETECTED NOT DETECTED Final   Bordetella Parapertussis NOT DETECTED NOT DETECTED Final   Chlamydophila pneumoniae NOT DETECTED NOT DETECTED Final   Mycoplasma pneumoniae NOT DETECTED NOT DETECTED Final    Comment: Performed at [facility], [address] [place], [place] [address]      Radiology Studies last 3 days: No results found.         Glori Machnik, DO [facility] 09/02/2024, 3:50 PM    Dictation software may have been used to generate the above note. Typos may occur and escape review in typed/dictated notes. Please contact Dr Marsa directly for clarity if needed.  Staff may message me via secure chat in Epic  but this may not receive an immediate response,  please page me for urgent matters!  If 7PM-7AM, please contact night coverage [email or web address]

## 2024-09-02 NOTE — Plan of Care (Signed)

## 2024-09-03 DIAGNOSIS — Z7401 Bed confinement status: Secondary | ICD-10-CM | POA: Diagnosis not present

## 2024-09-03 DIAGNOSIS — R0902 Hypoxemia: Secondary | ICD-10-CM | POA: Diagnosis not present

## 2024-09-03 DIAGNOSIS — R69 Illness, unspecified: Secondary | ICD-10-CM | POA: Diagnosis not present

## 2024-09-03 MED ORDER — BISACODYL 5 MG PO TBEC: 5.0000 mg | DELAYED_RELEASE_TABLET | Freq: Every day | ORAL | Status: AC | PRN

## 2024-09-03 MED ORDER — POLYETHYLENE GLYCOL 3350 17 G PO PACK: 17.0000 g | PACK | Freq: Every day | ORAL | Status: AC

## 2024-09-03 MED ORDER — FUROSEMIDE 20 MG PO TABS: 10.0000 mg | ORAL_TABLET | Freq: Every day | ORAL | Status: DC

## 2024-09-03 MED ORDER — ENSURE PLUS HIGH PROTEIN PO LIQD: 237.0000 mL | Freq: Two times a day (BID) | ORAL | Status: AC

## 2024-09-03 MED ORDER — LOSARTAN POTASSIUM 25 MG PO TABS: 25.0000 mg | ORAL_TABLET | Freq: Every day | ORAL | Status: DC

## 2024-09-03 MED ORDER — FUROSEMIDE 20 MG PO TABS: 10.0000 mg | ORAL_TABLET | Freq: Every day | ORAL | Status: AC

## 2024-09-03 NOTE — Discharge Summary (Signed)
 Physician Discharge Summary   Patient: Tiffany Caldwell MRN: [id number]  DOB: [birth year]   Admit:     Date of Admission: 08/29/2024 Admitted from: home / independent living facility [facility]   Discharge: Date of discharge: 09/03/24 Disposition: Skilled nursing facility Condition at discharge: fair  CODE STATUS: FULL CODE     Discharge Physician: Laneta Blunt, DO [facility]     PCP: Gil Greig BRAVO, NP  Recommendations for Outpatient Follow-up:  Follow up with PCP Gil, Amy E, NP in 1-2 weeks        Discharge Diagnoses: Principal Problem:   Hypoxia  See A/P below     Hospital course / significant events:   Tiffany Caldwell is a 83 y.o. female  with PMH significant for hypertension, hyperlipidemia, COPD, CAD s/p PCI and CABG , diastolic CHF, CKD-3A, thrombocytopenia, pulmonary MAI, IBS, gallstone, PVC/PAC, breast cancer.  She lives at [facility].  She presented to the ED because of shortness of breath and easy fatigability   HPI: symptoms about 2 days duration. She endorsed that she has chronic shortness of breath at baseline but this has worsened considerably in the last few days. She thinks she felt hot the night prior to admission. She normally uses 2 to 3 L oxygen  at night at baseline.   11/24: to ED. Admitted to hospitalist. IV diuresis 11/25: doing well and back onto po diuretics. PT/OT recs for SNF rehab  11/26: euvolemic, still on O2 around-the-clock (baseline just at night), walk test for ox sats pending. Approaching medical stability, pending auth for [facility] SNF  11/27: stable, mild alkalosis likely volume contraction will plan follow BMP / reduce lasix  tomorrow 11/28: stable. Peer to peer review w/ Dr Janit - Called 09/02/24 3:39 PM - Pt is in a grey area - yes dementia and worsening memory, there is chance pt will not be able to return to previous level of function, but I believe patient has some capacity to improve w/ rehab to  try to improve conditioning and we should give rehab a trial run. Rehab approved with the understanding this is a trial and rehab may not be an option in the future if pt continues to decline. I discussed this w/ pt's son who is understanding of the situation.  11/29: [place] can take for SNF rehab     Consultants:  none  Procedures/Surgeries: none      ASSESSMENT & PLAN:   Acute on chronic CHF with preserved EF Essential HTN Coronary Artery Disease without Acute Coronary Syndrome Mildly elevated troponins: due to demand ischemia. Oral Lasix  - 10 mg daily and can increase as needed for edema, weight gain, orthopnea  Continue aspirin , statin, beta blocker, and we added ARB   COPD Acute on chronic hypoxic respiratory failure  O2 as needed Continue bronchodilators   Question contraction alkalosis d/t diuresis Monitor BMP Reduced lasix    HLD Statin  CKD3a Monitor BMP  General weakness:  PT recommended discharge to SNF.  Follow-up with TOC to assist with disposition.   Dementia, mild Wax/wane confusion, no agitation  Dementia and worsening memory, there is chance pt will not be able to return to previous level of function, but I believe patient has some capacity to improve w/ rehab to hopefully improve conditioning  Rehab approved (see hospital course re: prior auth) with the understanding this is a trial and rehab may not be an option in the future if pt continues to decline.  I  discussed this w/ pt's son who is understanding of the situation.      Underweight based on BMI: Body mass index is 17.76 kg/m.SABRA Significantly low or high BMI is associated with higher medical risk.  Underweight - under 18  overweight - 25 to 29 obese - 30 or more Class 1 obesity: BMI of 30.0 to 34 Class 2 obesity: BMI of 35.0 to 39 Class 3 obesity: BMI of 40.0 to 49 Super Morbid Obesity: BMI 50-59 Super-super Morbid Obesity: BMI 60+ Healthy nutrition and physical activity advised  as adjunct to other disease management and risk reduction treatments            Discharge Instructions  Allergies as of 09/03/2024       Reactions   Amlodipine  Swelling   Leg swelling and fatigue. Other Reaction(s): edema/fatigue   Amoxicillin Other (See Comments)   No reaction. Just doesn't work anymore Other Reaction(s): not effetive-took so many times   Hydromorphone  Other (See Comments)   ALTERED MENTAL STATUS Altered mental status   Morphine  Other (See Comments)   Pt and family report altered mental status.   Azithromycin Rash   Ciprofloxacin Nausea And Vomiting, Nausea Only   Hydromorphone  Hcl Other (See Comments)   Altered mental status Altered mental status   Levofloxacin  Nausea Only, Other (See Comments)   Lisinopril Other (See Comments)   Fatigue Other Reaction(s): fatigue        Medication List     STOP taking these medications    gabapentin  300 MG capsule Commonly known as: NEURONTIN        TAKE these medications    Acetaminophen  Extra Strength 500 MG Tabs TAKE 2 TABLETS =1000MG  BY MOUTH TWICE DAILY *DO NOT EXCEED 4GM OF TYLENOL  IN 24 HOURS* (PATIENT REQUEST CAPLET)   albuterol  108 (90 Base) MCG/ACT inhaler Commonly known as: VENTOLIN  HFA Inhale 1-2 puffs into the lungs every 6 (six) hours as needed for wheezing or shortness of breath. prn   Anoro Ellipta  62.5-25 MCG/ACT Aepb Generic drug: umeclidinium-vilanterol INHALE 1 PUFF INTO THE LUNGS BY MOUTH ONCE DAILY   aspirin  EC 81 MG tablet Take 81 mg by mouth daily. Swallow whole.   atorvastatin  20 MG tablet Commonly known as: LIPITOR Take 1 tablet (20 mg total) by mouth daily.   Biotin 10000 MCG Tabs Take 1 tablet by mouth daily.   bisacodyl  5 MG EC tablet Commonly known as: DULCOLAX Take 1 tablet (5 mg total) by mouth daily as needed for moderate constipation.   carvedilol  6.25 MG tablet Commonly known as: COREG  Take 1 tablet (6.25 mg total) by mouth 2 (two) times daily with a  meal.   famotidine 20 MG tablet Commonly known as: PEPCID Take 20 mg by mouth daily as needed for heartburn or indigestion.   feeding supplement Liqd Take 237 mLs by mouth 2 (two) times daily between meals.   fexofenadine 180 MG tablet Commonly known as: ALLEGRA Take 180 mg by mouth daily.   fish oil-omega-3 fatty acids 1000 MG capsule Take 1 g by mouth daily.   furosemide  20 MG tablet Commonly known as: LASIX  Take 0.5 tablets (10 mg total) by mouth daily. Increase to 1 tablet (20 mg total) by mouth ONCE daily (total daily dose 20 mg) as needed for up to 3 days for increased leg swelling, shortness of breath, weight gain 5+ lbs over 1-2 days. Seek medical care if these symptoms are not improving with increased dose. Start taking on: September 04, 2024 What changed:  how much to take how to take this when to take this additional instructions   GLUCOSAMINE CHONDR 1500 COMPLX PO Take 1 tablet by mouth once.   HYOSCYAMINE  PO Take 0.375 mg by mouth every 12 (twelve) hours as needed (for stomach pain or spasms).   losartan 25 MG tablet Commonly known as: COZAAR Take 1 tablet (25 mg total) by mouth daily. Start taking on: September 04, 2024   mexiletine 150 MG capsule Commonly known as: MEXITIL  Take 1 capsule (150 mg total) by mouth 2 (two) times daily.   multivitamin tablet Take 1 tablet by mouth daily.   nitroGLYCERIN  0.4 MG SL tablet Commonly known as: NITROSTAT  Place 0.4 mg under the tongue every 5 (five) minutes as needed for chest pain.   polyethylene glycol 17 g packet Commonly known as: MIRALAX  / GLYCOLAX  Take 17 g by mouth daily. Start taking on: September 04, 2024   potassium chloride  10 MEQ tablet Commonly known as: KLOR-CON  M TAKE 1 TABLET BY MOUTH EVERY OTHER DAY *DO NOT CRUSH OR CHEW* *TAKE WITH FOOD*   sertraline  25 MG tablet Commonly known as: ZOLOFT  Take 1 tablet (25 mg total) by mouth daily.         Contact information for follow-up providers      Fargo, Amy E, NP Follow up.   Specialty: Adult Health Nurse Practitioner Why: hospital follow up Contact information: [address] [place] [address] [phone]              Contact information for after-discharge care     Destination     [facility] .   Service: Skilled Nursing Contact information: [address] [place]  [phone]                     Allergies  Allergen Reactions   Amlodipine  Swelling    Leg swelling and fatigue.  Other Reaction(s): edema/fatigue   Amoxicillin Other (See Comments)    No reaction. Just doesn't work anymore  Other Reaction(s): not effetive-took so many times   Hydromorphone  Other (See Comments)    ALTERED MENTAL STATUS  Altered mental status   Morphine  Other (See Comments)    Pt and family report altered mental status.   Azithromycin Rash   Ciprofloxacin Nausea And Vomiting and Nausea Only   Hydromorphone  Hcl Other (See Comments)    Altered mental status Altered mental status   Levofloxacin  Nausea Only and Other (See Comments)   Lisinopril Other (See Comments)    Fatigue  Other Reaction(s): fatigue     Subjective: pt feeling well this morning, denies CP/SOB   Discharge Exam: BP (!) 153/78 (BP Location: Left Arm)   Pulse 60   Temp 98 F (36.7 C) (Oral)   Resp 16   Ht 5' 5 (1.651 m)   Wt 48.6 kg   LMP  (LMP Unknown)   SpO2 95%   BMI 17.83 kg/m  General: Pt is alert, awake, not in acute distress Cardiovascular: RRR, S1/S2 +, no rubs, no gallops Respiratory:  only moderate inspiratory effort, faint rales/atelectasis at low bases but no wheezing, no rhonchi Abdominal: Soft, NT, ND, bowel sounds + Extremities: no edema, no cyanosis     The results of significant diagnostics from this hospitalization (including imaging, microbiology, ancillary and laboratory) are listed below for reference.     Microbiology: Recent Results (from the past 240 hours)   Resp panel by RT-PCR (RSV, Flu A&B, Covid) Anterior Nasal Swab  Status: None   Collection Time: 08/29/24 11:52 AM   Specimen: Anterior Nasal Swab  Result Value Ref Range Status   SARS Coronavirus 2 by RT PCR NEGATIVE NEGATIVE Final    Comment: (NOTE) SARS-CoV-2 target nucleic acids are NOT DETECTED.  The SARS-CoV-2 RNA is generally detectable in upper respiratory specimens during the acute phase of infection. The lowest concentration of SARS-CoV-2 viral copies this assay can detect is 138 copies/mL. A negative result does not preclude SARS-Cov-2 infection and should not be used as the sole basis for treatment or other patient management decisions. A negative result may occur with  improper specimen collection/handling, submission of specimen other than nasopharyngeal swab, presence of viral mutation(s) within the areas targeted by this assay, and inadequate number of viral copies(<138 copies/mL). A negative result must be combined with clinical observations, patient history, and epidemiological information. The expected result is Negative.  Fact Sheet for Patients:  bloggercourse.com  Fact Sheet for Healthcare Providers:  seriousbroker.it  This test is no t yet approved or cleared by the [place]  FDA and  has been authorized for detection and/or diagnosis of SARS-CoV-2 by FDA under an Emergency Use Authorization (EUA). This EUA will remain  in effect (meaning this test can be used) for the duration of the COVID-19 declaration under Section 564(b)(1) of the Act, 21 U.S.C.section 360bbb-3(b)(1), unless the authorization is terminated  or revoked sooner.       Influenza A by PCR NEGATIVE NEGATIVE Final   Influenza B by PCR NEGATIVE NEGATIVE Final    Comment: (NOTE) The Xpert Xpress SARS-CoV-2/FLU/RSV plus assay is intended as an aid in the diagnosis of influenza from Nasopharyngeal swab specimens and should not be used  as a sole basis for treatment. Nasal washings and aspirates are unacceptable for Xpert Xpress SARS-CoV-2/FLU/RSV testing.  Fact Sheet for Patients: bloggercourse.com  Fact Sheet for Healthcare Providers: seriousbroker.it  This test is not yet approved or cleared by the [place]  FDA and has been authorized for detection and/or diagnosis of SARS-CoV-2 by FDA under an Emergency Use Authorization (EUA). This EUA will remain in effect (meaning this test can be used) for the duration of the COVID-19 declaration under Section 564(b)(1) of the Act, 21 U.S.C. section 360bbb-3(b)(1), unless the authorization is terminated or revoked.     Resp Syncytial Virus by PCR NEGATIVE NEGATIVE Final    Comment: (NOTE) Fact Sheet for Patients: bloggercourse.com  Fact Sheet for Healthcare Providers: seriousbroker.it  This test is not yet approved or cleared by the [place]  FDA and has been authorized for detection and/or diagnosis of SARS-CoV-2 by FDA under an Emergency Use Authorization (EUA). This EUA will remain in effect (meaning this test can be used) for the duration of the COVID-19 declaration under Section 564(b)(1) of the Act, 21 U.S.C. section 360bbb-3(b)(1), unless the authorization is terminated or revoked.  Performed at [facility], [address] [place], [place] [address]   Respiratory (~20 pathogens) panel by PCR     Status: None   Collection Time: 08/29/24  3:40 PM   Specimen: Nasopharyngeal Swab; Respiratory  Result Value Ref Range Status   Adenovirus NOT DETECTED NOT DETECTED Final   Coronavirus 229E NOT DETECTED NOT DETECTED Final    Comment: (NOTE) The Coronavirus on the Respiratory Panel, DOES NOT test for the novel  Coronavirus (2019 nCoV)    Coronavirus HKU1 NOT DETECTED NOT DETECTED Final   Coronavirus NL63 NOT DETECTED NOT DETECTED Final    Coronavirus OC43 NOT DETECTED NOT  DETECTED Final   Metapneumovirus NOT DETECTED NOT DETECTED Final   Rhinovirus / Enterovirus NOT DETECTED NOT DETECTED Final   Influenza A NOT DETECTED NOT DETECTED Final   Influenza B NOT DETECTED NOT DETECTED Final   Parainfluenza Virus 1 NOT DETECTED NOT DETECTED Final   Parainfluenza Virus 2 NOT DETECTED NOT DETECTED Final   Parainfluenza Virus 3 NOT DETECTED NOT DETECTED Final   Parainfluenza Virus 4 NOT DETECTED NOT DETECTED Final   Respiratory Syncytial Virus NOT DETECTED NOT DETECTED Final   Bordetella pertussis NOT DETECTED NOT DETECTED Final   Bordetella Parapertussis NOT DETECTED NOT DETECTED Final   Chlamydophila pneumoniae NOT DETECTED NOT DETECTED Final   Mycoplasma pneumoniae NOT DETECTED NOT DETECTED Final    Comment: Performed at [facility], [address], [place], [place] [address]     Labs: BNP (last 3 results) Recent Labs    11/05/23 1455 01/11/24 1243  BNP 855.4* 876.8*   Basic Metabolic Panel: Recent Labs  Lab 08/29/24 1311 08/30/24 0446 09/01/24 0610 09/02/24 0543  NA 137 135 136 137  K 4.5 3.5 3.6 3.7  CL 101 94* 96* 97*  CO2 28 30 33* 30  GLUCOSE 114* 108* 99 99  BUN 18 19 25* 26*  CREATININE 0.75 0.91 0.92 0.90  CALCIUM  10.2 10.2 9.7 10.1   Liver Function Tests: Recent Labs  Lab 08/29/24 1311  AST 28  ALT 11  ALKPHOS 97  BILITOT 0.9  PROT 6.7  ALBUMIN 3.4*   No results for input(s): LIPASE, AMYLASE in the last 168 hours. No results for input(s): AMMONIA in the last 168 hours. CBC: Recent Labs  Lab 08/29/24 1127  WBC 8.0  NEUTROABS 6.6  HGB 13.6  HCT 41.1  MCV 100.2*  PLT 114*   Cardiac Enzymes: No results for input(s): CKTOTAL, CKMB, CKMBINDEX, TROPONINI in the last 168 hours. BNP: Invalid input(s): POCBNP CBG: No results for input(s): GLUCAP in the last 168 hours. D-Dimer No results for input(s): DDIMER in the last 72 hours. Hgb A1c No results for  input(s): HGBA1C in the last 72 hours. Lipid Profile No results for input(s): CHOL, HDL, LDLCALC, TRIG, CHOLHDL, LDLDIRECT in the last 72 hours. Thyroid  function studies No results for input(s): TSH, T4TOTAL, T3FREE, THYROIDAB in the last 72 hours.  Invalid input(s): FREET3 Anemia work up No results for input(s): VITAMINB12, FOLATE, FERRITIN, TIBC, IRON, RETICCTPCT in the last 72 hours. Urinalysis    Component Value Date/Time   COLORURINE YELLOW (A) 08/29/2024 1448   APPEARANCEUR CLEAR (A) 08/29/2024 1448   LABSPEC 1.009 08/29/2024 1448   PHURINE 7.0 08/29/2024 1448   GLUCOSEU NEGATIVE 08/29/2024 1448   HGBUR NEGATIVE 08/29/2024 1448   BILIRUBINUR NEGATIVE 08/29/2024 1448   KETONESUR NEGATIVE 08/29/2024 1448   PROTEINUR NEGATIVE 08/29/2024 1448   NITRITE NEGATIVE 08/29/2024 1448   LEUKOCYTESUR NEGATIVE 08/29/2024 1448   Sepsis Labs Recent Labs  Lab 08/29/24 1127  WBC 8.0   Microbiology Recent Results (from the past 240 hours)  Resp panel by RT-PCR (RSV, Flu A&B, Covid) Anterior Nasal Swab     Status: None   Collection Time: 08/29/24 11:52 AM   Specimen: Anterior Nasal Swab  Result Value Ref Range Status   SARS Coronavirus 2 by RT PCR NEGATIVE NEGATIVE Final    Comment: (NOTE) SARS-CoV-2 target nucleic acids are NOT DETECTED.  The SARS-CoV-2 RNA is generally detectable in upper respiratory specimens during the acute phase of infection. The lowest concentration of SARS-CoV-2 viral copies this assay  can detect is 138 copies/mL. A negative result does not preclude SARS-Cov-2 infection and should not be used as the sole basis for treatment or other patient management decisions. A negative result may occur with  improper specimen collection/handling, submission of specimen other than nasopharyngeal swab, presence of viral mutation(s) within the areas targeted by this assay, and inadequate number of viral copies(<138 copies/mL). A negative  result must be combined with clinical observations, patient history, and epidemiological information. The expected result is Negative.  Fact Sheet for Patients:  bloggercourse.com  Fact Sheet for Healthcare Providers:  seriousbroker.it  This test is no t yet approved or cleared by the [place]  FDA and  has been authorized for detection and/or diagnosis of SARS-CoV-2 by FDA under an Emergency Use Authorization (EUA). This EUA will remain  in effect (meaning this test can be used) for the duration of the COVID-19 declaration under Section 564(b)(1) of the Act, 21 U.S.C.section 360bbb-3(b)(1), unless the authorization is terminated  or revoked sooner.       Influenza A by PCR NEGATIVE NEGATIVE Final   Influenza B by PCR NEGATIVE NEGATIVE Final    Comment: (NOTE) The Xpert Xpress SARS-CoV-2/FLU/RSV plus assay is intended as an aid in the diagnosis of influenza from Nasopharyngeal swab specimens and should not be used as a sole basis for treatment. Nasal washings and aspirates are unacceptable for Xpert Xpress SARS-CoV-2/FLU/RSV testing.  Fact Sheet for Patients: bloggercourse.com  Fact Sheet for Healthcare Providers: seriousbroker.it  This test is not yet approved or cleared by the [place]  FDA and has been authorized for detection and/or diagnosis of SARS-CoV-2 by FDA under an Emergency Use Authorization (EUA). This EUA will remain in effect (meaning this test can be used) for the duration of the COVID-19 declaration under Section 564(b)(1) of the Act, 21 U.S.C. section 360bbb-3(b)(1), unless the authorization is terminated or revoked.     Resp Syncytial Virus by PCR NEGATIVE NEGATIVE Final    Comment: (NOTE) Fact Sheet for Patients: bloggercourse.com  Fact Sheet for Healthcare Providers: seriousbroker.it  This  test is not yet approved or cleared by the [place]  FDA and has been authorized for detection and/or diagnosis of SARS-CoV-2 by FDA under an Emergency Use Authorization (EUA). This EUA will remain in effect (meaning this test can be used) for the duration of the COVID-19 declaration under Section 564(b)(1) of the Act, 21 U.S.C. section 360bbb-3(b)(1), unless the authorization is terminated or revoked.  Performed at [facility], [address] [place], [place] [address]   Respiratory (~20 pathogens) panel by PCR     Status: None   Collection Time: 08/29/24  3:40 PM   Specimen: Nasopharyngeal Swab; Respiratory  Result Value Ref Range Status   Adenovirus NOT DETECTED NOT DETECTED Final   Coronavirus 229E NOT DETECTED NOT DETECTED Final    Comment: (NOTE) The Coronavirus on the Respiratory Panel, DOES NOT test for the novel  Coronavirus (2019 nCoV)    Coronavirus HKU1 NOT DETECTED NOT DETECTED Final   Coronavirus NL63 NOT DETECTED NOT DETECTED Final   Coronavirus OC43 NOT DETECTED NOT DETECTED Final   Metapneumovirus NOT DETECTED NOT DETECTED Final   Rhinovirus / Enterovirus NOT DETECTED NOT DETECTED Final   Influenza A NOT DETECTED NOT DETECTED Final   Influenza B NOT DETECTED NOT DETECTED Final   Parainfluenza Virus 1 NOT DETECTED NOT DETECTED Final   Parainfluenza Virus 2 NOT DETECTED NOT DETECTED Final   Parainfluenza Virus 3 NOT DETECTED NOT DETECTED Final  Parainfluenza Virus 4 NOT DETECTED NOT DETECTED Final   Respiratory Syncytial Virus NOT DETECTED NOT DETECTED Final   Bordetella pertussis NOT DETECTED NOT DETECTED Final   Bordetella Parapertussis NOT DETECTED NOT DETECTED Final   Chlamydophila pneumoniae NOT DETECTED NOT DETECTED Final   Mycoplasma pneumoniae NOT DETECTED NOT DETECTED Final    Comment: Performed at [facility], [address] [place], [place] [address]   Imaging DG Chest 2 View Result Date: 08/29/2024 EXAM: 2 VIEW(S) XRAY OF  THE CHEST 08/29/2024 11:54:45 AM COMPARISON: 03/15/2024 CLINICAL HISTORY: SOB (shortness of breath) FINDINGS: LUNGS AND PLEURA: The chin overlies the apices on the frontal radiograph. Lower lung predominant interstitial prominence and indistinctness. No lobar consolidation. No pleural effusion. No pneumothorax. HEART AND MEDIASTINUM: Moderate cardiomegaly. Transverse aortic atherosclerosis. BONES AND SOFT TISSUES: Median sternotomy for CABG. Left axillary node dissection. No acute osseous abnormality. IMPRESSION: 1. Cardiomegaly, moderate, without lobar consolidation. 2. Lower lung predominant interstitial prominence and indistinctness, likely interstitial edema. Electronically signed by: Rockey Kilts MD 08/29/2024 12:22 PM EST RP Workstation: [id number]      Time coordinating discharge: over 30 minutes  SIGNED:  Kasiah Manka DO [facility]

## 2024-09-03 NOTE — Plan of Care (Signed)
  Problem: Education: Goal: Knowledge of General Education information will improve Description: Including pain rating scale, medication(s)/side effects and non-pharmacologic comfort measures Outcome: Progressing   Problem: Clinical Measurements: Goal: Ability to maintain clinical measurements within normal limits will improve Outcome: Progressing Goal: Will remain free from infection Outcome: Progressing Goal: Diagnostic test results will improve Outcome: Progressing Goal: Respiratory complications will improve Outcome: Progressing Goal: Cardiovascular complication will be avoided Outcome: Progressing   Problem: Pain Managment: Goal: General experience of comfort will improve and/or be controlled Outcome: Progressing   Problem: Safety: Goal: Ability to remain free from injury will improve Outcome: Progressing

## 2024-09-05 ENCOUNTER — Non-Acute Institutional Stay (SKILLED_NURSING_FACILITY): Payer: Self-pay | Admitting: Nurse Practitioner

## 2024-09-05 DIAGNOSIS — R0989 Other specified symptoms and signs involving the circulatory and respiratory systems: Secondary | ICD-10-CM

## 2024-09-05 DIAGNOSIS — I5032 Chronic diastolic (congestive) heart failure: Secondary | ICD-10-CM

## 2024-09-05 DIAGNOSIS — R634 Abnormal weight loss: Secondary | ICD-10-CM

## 2024-09-05 DIAGNOSIS — I480 Paroxysmal atrial fibrillation: Secondary | ICD-10-CM | POA: Diagnosis not present

## 2024-09-05 DIAGNOSIS — I25119 Atherosclerotic heart disease of native coronary artery with unspecified angina pectoris: Secondary | ICD-10-CM

## 2024-09-05 DIAGNOSIS — F03A4 Unspecified dementia, mild, with anxiety: Secondary | ICD-10-CM

## 2024-09-05 DIAGNOSIS — K5904 Chronic idiopathic constipation: Secondary | ICD-10-CM | POA: Diagnosis not present

## 2024-09-05 DIAGNOSIS — J439 Emphysema, unspecified: Secondary | ICD-10-CM | POA: Diagnosis not present

## 2024-09-05 NOTE — Progress Notes (Unsigned)
 Location:  Other Nursing Home Room Number: [facility] Place of Service:  SNF (31)  Gil Greig BRAVO, NP  Patient Care Team: Gil Greig BRAVO, NP as PCP - General (Adult Health Nurse Practitioner) End, Lonni, MD as PCP - Cardiology (Cardiology) Kennyth Chew, MD as PCP - Electrophysiology (Cardiology) Jakie Alm SAUNDERS, MD as Attending Physician (Gastroenterology) Claudene MICAEL Rogerio Mickey., MD (Obstetrics and Gynecology) Jason Charleston, MD (Inactive) (Radiation Oncology) Tona Vinie RAMAN, MD (Hematology and Oncology) Sheril Coy, MD as Consulting Physician (Orthopedic Surgery) Joshua Blamer, MD as Attending Physician (Dermatology) Leslee Reusing, MD as Consulting Physician (Ophthalmology) Shelah Charleston RAMAN, MD as Consulting Physician (Pulmonary Disease)  Extended Emergency Contact Information Primary Emergency Contact: [facility] Address: [address]          [place], [place] [address] [place] Mobile Phone: [phone] Relation: Spouse Secondary Emergency Contact: Tschantz,John Address: [address]          [place], [place] [address] [place] Home Phone: [phone] Relation: Son  Goals of care: Advanced Directive information    08/29/2024    5:28 PM  Advanced Directives  Would patient like information on creating a medical advance directive? No - Patient declined     Chief Complaint  Patient presents with   Acute Visit    Hospital follow up    HPI:  Pt is a 83 y.o. female seen today for an acute visit for hospital follow up.   She has hx of chronic obstructive pulmonary disease and coronary artery disease, presents for a hospital follow-up after being admitted for shortness of breath and fatigue.  She was admitted to the hospital from November 24th to November 29th due to increased shortness of breath and fatigue over two days. She normally uses oxygen  at night and was given IV diuretics during her hospital stay to manage fluid  overload. Post-discharge, she continues to use oxygen  as needed and is on oral Lasix  10 mg daily. No current shortness of breath beyond baseline is reported.  She has a history of emphysema and COPD. She uses Anoro Ellipta , one puff daily, and does not require a rescue inhaler like albuterol . No current shortness of breath beyond her baseline.  Her past medical history includes high cholesterol, for which she takes Lipitor,   Hx of chronic kidney diseas  She also has coronary artery disease and is on aspirin  81 mg daily with statin. No chest pain is reported, and she continues on her cardiac medications, including carvedilol  6.25 mg twice daily and losartan 25 mg daily. She also takes potassium supplements with her Lasix .  She has a history of paroxysmal atrial fibrillation and is followed by a cardiologist. She is not on anticoagulation due to fall risk but continues on aspirin .  She reports no significant change in strength or mobility since before her hospital admission and continues to use a walker, which she has used for a long time. She reports she is able to bathe and dress herself independently.  She takes Zoloft  25 mg daily, which her husband suggested for anxiety, although she does not feel she needs it. She feels like her memory is good.  She takes Miralax  daily to manage constipation and reports no issues with constipation or diarrhea.   She has experienced weight loss but maintains a good appetite.  She takes Allegra daily for allergies and reports no issues with indigestion or acid reflux.   Past Medical History:  Diagnosis Date   Allergy  Arthritis    Breast cancer (HCC)    left breast   Coronary artery disease 2001   CABG   DDD (degenerative disc disease), lumbosacral    Dyspnea    if rushing or climmbing lots of stairs   Emphysema lung (HCC)    emphysema   GERD (gastroesophageal reflux disease)    11/20/16- no a current problem   Hyperlipidemia    Hypertension     Irritable bowel syndrome (IBS)    Lung disorder    leison   Neuropathy    Pinched nerve    back and left leg   Pneumonia 2016   Past Surgical History:  Procedure Laterality Date   BREAST FIBROADENOMA SURGERY  01/13/1980   BREAST SURGERY  02/01/2004   tram/mastectomyfor recurrence   CARPOMETACARPEL SUSPENSION PLASTY Right 12/17/2017   Procedure: SUSPENSIONPLASTY RIGHT THUMB WITH EXCISION TRAPEZIUM;  Surgeon: Murrell Kuba, MD;  Location: [facility];  Service: Orthopedics;  Laterality: Right;   COLONOSCOPY     CORONARY ANGIOPLASTY  2001   had tear to two vessels- had to have open heart surgery   CORONARY ARTERY BYPASS GRAFT  2001   EYE SURGERY Bilateral    Cataract   KNEE ARTHROSCOPY  January 2011   right   MASTECTOMY PARTIAL / LUMPECTOMY W/ AXILLARY LYMPHADENECTOMY  04/10/1989   Dr Neysa / left breast   PARTIAL HYSTERECTOMY     vaginal   RIGHT/LEFT HEART CATH AND CORONARY ANGIOGRAPHY Bilateral 06/17/2023   Procedure: RIGHT/LEFT HEART CATH AND CORONARY ANGIOGRAPHY;  Surgeon: Mady Bruckner, MD;  Location: [facility];  Service: Cardiovascular;  Laterality: Bilateral;   TENDON TRANSFER Right 12/17/2017   Procedure: ABDUCTOR POLLICIS LONGUS TRANSFER;  Surgeon: Murrell Kuba, MD;  Location: [facility];  Service: Orthopedics;  Laterality: Right;   TONSILLECTOMY AND ADENOIDECTOMY     TOTAL HIP ARTHROPLASTY Right 08/05/2016   Procedure: TOTAL HIP ARTHROPLASTY ANTERIOR APPROACH;  Surgeon: Maude Herald, MD;  Location: [facility];  Service: Orthopedics;  Laterality: Right;   UPPER GASTROINTESTINAL ENDOSCOPY     VIDEO BRONCHOSCOPY WITH ENDOBRONCHIAL NAVIGATION N/A 11/27/2016   Procedure: VIDEO BRONCHOSCOPY WITH ENDOBRONCHIAL NAVIGATION;  Surgeon: Elspeth JAYSON Millers, MD;  Location: [facility];  Service: Thoracic;  Laterality: N/A;    Allergies  Allergen Reactions   Amlodipine  Swelling    Leg swelling and fatigue.  Other Reaction(s): edema/fatigue    Amoxicillin Other (See Comments)    No reaction. Just doesn't work anymore  Other Reaction(s): not effetive-took so many times   Hydromorphone  Other (See Comments)    ALTERED MENTAL STATUS  Altered mental status   Morphine  Other (See Comments)    Pt and family report altered mental status.   Azithromycin Rash   Ciprofloxacin Nausea And Vomiting and Nausea Only   Hydromorphone  Hcl Other (See Comments)    Altered mental status Altered mental status   Levofloxacin  Nausea Only and Other (See Comments)   Lisinopril Other (See Comments)    Fatigue  Other Reaction(s): fatigue    Outpatient Encounter Medications as of 09/05/2024  Medication Sig   Acetaminophen  Extra Strength 500 MG TABS TAKE 2 TABLETS =1000MG  BY MOUTH TWICE DAILY *DO NOT EXCEED 4GM OF TYLENOL  IN 24 HOURS* (PATIENT REQUEST CAPLET)   albuterol  (VENTOLIN  HFA) 108 (90 Base) MCG/ACT inhaler Inhale 1-2 puffs into the lungs every 6 (six) hours as needed for wheezing or shortness of breath. prn   aspirin  EC 81 MG tablet Take 81 mg by mouth daily.  Swallow whole.   atorvastatin  (LIPITOR) 20 MG tablet Take 1 tablet (20 mg total) by mouth daily.   Biotin 89999 MCG TABS Take 1 tablet by mouth daily.   bisacodyl  (DULCOLAX) 5 MG EC tablet Take 1 tablet (5 mg total) by mouth daily as needed for moderate constipation.   carvedilol  (COREG ) 6.25 MG tablet Take 1 tablet (6.25 mg total) by mouth 2 (two) times daily with a meal.   famotidine (PEPCID) 20 MG tablet Take 20 mg by mouth daily as needed for heartburn or indigestion.   feeding supplement (ENSURE PLUS HIGH PROTEIN) LIQD Take 237 mLs by mouth 2 (two) times daily between meals.   fexofenadine (ALLEGRA) 180 MG tablet Take 180 mg by mouth daily.   fish oil-omega-3 fatty acids 1000 MG capsule Take 1 g by mouth daily.    furosemide  (LASIX ) 20 MG tablet Take 0.5 tablets (10 mg total) by mouth daily. Increase to 1 tablet (20 mg total) by mouth ONCE daily (total daily dose 20 mg) as needed for  up to 3 days for increased leg swelling, shortness of breath, weight gain 5+ lbs over 1-2 days. Seek medical care if these symptoms are not improving with increased dose.   Glucosamine-Chondroit-Vit C-Mn (GLUCOSAMINE CHONDR 1500 COMPLX PO) Take 1 tablet by mouth once.   Hyoscyamine  Sulfate (HYOSCYAMINE  PO) Take 0.375 mg by mouth every 12 (twelve) hours as needed (for stomach pain or spasms).   losartan (COZAAR) 25 MG tablet Take 1 tablet (25 mg total) by mouth daily.   mexiletine (MEXITIL ) 150 MG capsule Take 1 capsule (150 mg total) by mouth 2 (two) times daily.   Multiple Vitamin (MULTIVITAMIN) tablet Take 1 tablet by mouth daily.     nitroGLYCERIN  (NITROSTAT ) 0.4 MG SL tablet Place 0.4 mg under the tongue every 5 (five) minutes as needed for chest pain.   polyethylene glycol (MIRALAX  / GLYCOLAX ) 17 g packet Take 17 g by mouth daily.   potassium chloride  (KLOR-CON  M) 10 MEQ tablet TAKE 1 TABLET BY MOUTH EVERY OTHER DAY *DO NOT CRUSH OR CHEW* *TAKE WITH FOOD*   sertraline  (ZOLOFT ) 25 MG tablet Take 1 tablet (25 mg total) by mouth daily.   umeclidinium-vilanterol (ANORO ELLIPTA ) 62.5-25 MCG/ACT AEPB INHALE 1 PUFF INTO THE LUNGS BY MOUTH ONCE DAILY   No facility-administered encounter medications on file as of 09/05/2024.    Review of Systems  Constitutional:  Negative for activity change, appetite change, fatigue and unexpected weight change.  HENT:  Negative for congestion and hearing loss.   Eyes: Negative.   Respiratory:  Negative for cough and shortness of breath.   Cardiovascular:  Negative for chest pain, palpitations and leg swelling.  Gastrointestinal:  Negative for abdominal pain, constipation and diarrhea.  Genitourinary:  Negative for difficulty urinating and dysuria.  Musculoskeletal:  Negative for arthralgias and myalgias.  Skin:  Negative for color change and wound.  Neurological:  Negative for dizziness and weakness.  Psychiatric/Behavioral:  Positive for confusion. Negative  for agitation and behavioral problems.     Immunization History  Administered Date(s) Administered   Fluad Trivalent(High Dose 65+) 07/22/2023   Fluzone Influenza virus vaccine,trivalent (IIV3), split virus 07/17/2009, 07/07/2011, 07/28/2013, 07/17/2014, 07/26/2015, 07/18/2016, 07/27/2017   INFLUENZA, HIGH DOSE SEASONAL PF 07/01/2018, 08/02/2018, 08/07/2019, 08/06/2021   Influenza,inj,Quad PF,6+ Mos 07/26/2015   Influenza-Unspecified 08/15/2014, 07/08/2019, 07/04/2022   Moderna Covid-19 Vaccine Bivalent Booster 36yrs & up 07/19/2021, 08/06/2021   Moderna SARS-COV2 Booster Vaccination 08/17/2020   Moderna Sars-Covid-2 Vaccination 10/18/2019, 11/15/2019, 01/22/2021  PNEUMOCOCCAL CONJUGATE-20 08/06/2023   Pneumococcal Conjugate-13 05/22/2014   Pneumococcal Polysaccharide-23 01/24/2005, 07/01/2018   Pneumococcal-Unspecified 06/15/2014   RSV,unspecified 07/04/2022   Td 08/04/2022   Td (Adult) 12/27/2001   Tdap 05/12/2012   Unspecified SARS-COV-2 Vaccination 08/29/2022   Zoster Recombinant(Shingrix) 03/20/2018, 08/22/2018   Zoster, Live 08/10/2006, 03/20/2018, 08/24/2018   Pertinent  Health Maintenance Due  Topic Date Due   Mammogram  03/04/2008   Influenza Vaccine  05/06/2024   Bone Density Scan  Completed   Colonoscopy  Discontinued      07/21/2019    6:45 PM 08/04/2019   12:02 AM 08/11/2022    5:49 PM 05/18/2024    1:46 PM 08/03/2024    2:26 PM  Fall Risk  Falls in the past year?    0 0  Was there an injury with Fall?    0  0   Fall Risk Category Calculator    0 0  (RETIRED) Patient Fall Risk Level Low fall risk  Low fall risk  Low fall risk     Patient at Risk for Falls Due to    Impaired balance/gait;Impaired mobility Impaired balance/gait;Impaired mobility  Fall risk Follow up    Falls evaluation completed Falls evaluation completed     Data saved with a previous flowsheet row definition   Functional Status Survey:    Vitals:   09/05/24 1156  BP: 137/65  Pulse:  (!) 55  Resp: 20  Temp: (!) 97.2 F (36.2 C)  SpO2: 94%  Weight: 106 lb (48.1 kg)   Body mass index is 17.64 kg/m. Wt Readings from Last 3 Encounters:  09/05/24 106 lb (48.1 kg)  09/03/24 107 lb 2.3 oz (48.6 kg)  08/03/24 111 lb (50.3 kg)    Physical Exam Constitutional:      General: She is not in acute distress.    Appearance: She is well-developed. She is not diaphoretic.  HENT:     Head: Normocephalic and atraumatic.     Mouth/Throat:     Pharynx: No oropharyngeal exudate.  Eyes:     Conjunctiva/sclera: Conjunctivae normal.     Pupils: Pupils are equal, round, and reactive to light.  Cardiovascular:     Rate and Rhythm: Normal rate and regular rhythm.     Heart sounds: Normal heart sounds.  Pulmonary:     Effort: Pulmonary effort is normal.     Breath sounds: Normal breath sounds.  Abdominal:     General: Bowel sounds are normal.     Palpations: Abdomen is soft.  Musculoskeletal:     Cervical back: Normal range of motion and neck supple.     Right lower leg: No edema.     Left lower leg: No edema.  Skin:    General: Skin is warm and dry.  Neurological:     Mental Status: She is alert.  Psychiatric:        Mood and Affect: Mood normal.      Labs reviewed: Recent Labs    01/10/24 1959 01/11/24 1243 01/12/24 0600 08/30/24 0446 09/01/24 0610 09/02/24 0543  NA 138 138   < > 135 136 137  K 3.6 3.9   < > 3.5 3.6 3.7  CL 102 101   < > 94* 96* 97*  CO2 27 28   < > 30 33* 30  GLUCOSE 93 98   < > 108* 99 99  BUN 19 20   < > 19 25* 26*  CREATININE 0.71 0.91   < >  0.91 0.92 0.90  CALCIUM  10.3 10.8*   < > 10.2 9.7 10.1  MG 2.1 2.0  --   --   --   --   PHOS  --  3.1  --   --   --   --    < > = values in this interval not displayed.   Recent Labs    01/12/24 2049 01/14/24 0538 06/20/24 0905 08/29/24 1311  AST 45* 53* 39* 28  ALT 28 29 26 11   ALKPHOS 100 90  --  97  BILITOT 1.0 1.4* 0.6 0.9  PROT 6.9 6.5 7.0 6.7  ALBUMIN 3.7 3.3*  --  3.4*   Recent  Labs    01/11/24 1243 01/12/24 2049 03/15/24 1516 06/20/24 0905 08/29/24 1127  WBC 5.6   < > 3.8* 4.3 8.0  NEUTROABS 4.1  --   --  2,662 6.6  HGB 15.0   < > 15.0 13.8 13.6  HCT 44.7   < > 45.6 42.4 41.1  MCV 99.8   < > 100.4* 100.0 100.2*  PLT 136*   < > 135* 152 114*   < > = values in this interval not displayed.   Lab Results  Component Value Date   TSH 3.10 06/20/2024   Lab Results  Component Value Date   HGBA1C 6.2 (H) 06/20/2024   Lab Results  Component Value Date   CHOL 180 06/20/2024   HDL 66 06/20/2024   LDLCALC 95 06/20/2024   TRIG 93 06/20/2024   CHOLHDL 2.7 06/20/2024    Significant Diagnostic Results in last 30 days:  DG Chest 2 View Result Date: 08/29/2024 EXAM: 2 VIEW(S) XRAY OF THE CHEST 08/29/2024 11:54:45 AM COMPARISON: 03/15/2024 CLINICAL HISTORY: SOB (shortness of breath) FINDINGS: LUNGS AND PLEURA: The chin overlies the apices on the frontal radiograph. Lower lung predominant interstitial prominence and indistinctness. No lobar consolidation. No pleural effusion. No pneumothorax. HEART AND MEDIASTINUM: Moderate cardiomegaly. Transverse aortic atherosclerosis. BONES AND SOFT TISSUES: Median sternotomy for CABG. Left axillary node dissection. No acute osseous abnormality. IMPRESSION: 1. Cardiomegaly, moderate, without lobar consolidation. 2. Lower lung predominant interstitial prominence and indistinctness, likely interstitial edema. Electronically signed by: Rockey Kilts MD 08/29/2024 12:22 PM EST RP Workstation: [id number]    Assessment/Plan  Chronic heart failure Euvolemic status. Managed with oral Lasix  10 mg daily. - Continue Lasix  10 mg daily. - Continue potassium supplementation.  Paroxysmal atrial fibrillation Continues on Coreg  and aspirin . Not on anticoagulation due to fall risk. - Continue Coreg  6.25 mg twice daily. - Continue aspirin  81 mg daily.  Hypertension Controlled with current medication regimen. - Continue Coreg  6.25 mg twice  daily. - Continue losartan 25 mg daily.  Coronary artery disease Well-managed without chest pain. Continues on Lipitor and aspirin . - Continue Lipitor. - Continue aspirin  81 mg daily.  Emphysema Baseline shortness of breath. Continues on Anoro daily. - Continue Anoro daily. - Use oxygen  as needed.  Chronic kidney disease Well-managed with stable kidney function. - Continue monitoring kidney function.  Dementia with anxiety -needs supportive care with ADLs -Continues on Zoloft  25 mg daily. Denies significant anxiety.  Chronic constipation Well-controlled with daily Miralax . - Continue Miralax  daily.  Abnormal weight loss She reports appetite reported as good, but weight loss persists. Encouraged to increase caloric intake. - Encouraged three meals a day while in rehab. - Monitored weight. -may need supplement  Hyperlipidemia Managed with Lipitor. - Continue Lipitor.  Daleon Willinger K. Caro BODILY [facility] [phone]

## 2024-09-06 ENCOUNTER — Encounter: Payer: Self-pay | Admitting: Internal Medicine

## 2024-09-06 ENCOUNTER — Non-Acute Institutional Stay (SKILLED_NURSING_FACILITY): Payer: Self-pay | Admitting: Internal Medicine

## 2024-09-06 DIAGNOSIS — Z9981 Dependence on supplemental oxygen: Secondary | ICD-10-CM

## 2024-09-06 DIAGNOSIS — I25119 Atherosclerotic heart disease of native coronary artery with unspecified angina pectoris: Secondary | ICD-10-CM

## 2024-09-06 DIAGNOSIS — F03A4 Unspecified dementia, mild, with anxiety: Secondary | ICD-10-CM | POA: Insufficient documentation

## 2024-09-06 DIAGNOSIS — N1831 Chronic kidney disease, stage 3a: Secondary | ICD-10-CM

## 2024-09-06 DIAGNOSIS — J449 Chronic obstructive pulmonary disease, unspecified: Secondary | ICD-10-CM | POA: Diagnosis not present

## 2024-09-06 DIAGNOSIS — J9611 Chronic respiratory failure with hypoxia: Secondary | ICD-10-CM | POA: Diagnosis not present

## 2024-09-06 DIAGNOSIS — F03B4 Unspecified dementia, moderate, with anxiety: Secondary | ICD-10-CM | POA: Insufficient documentation

## 2024-09-06 DIAGNOSIS — I5032 Chronic diastolic (congestive) heart failure: Secondary | ICD-10-CM | POA: Diagnosis not present

## 2024-09-06 DIAGNOSIS — I10 Essential (primary) hypertension: Secondary | ICD-10-CM

## 2024-09-06 DIAGNOSIS — K589 Irritable bowel syndrome without diarrhea: Secondary | ICD-10-CM | POA: Diagnosis not present

## 2024-09-06 DIAGNOSIS — R5381 Other malaise: Secondary | ICD-10-CM | POA: Diagnosis not present

## 2024-09-06 DIAGNOSIS — E785 Hyperlipidemia, unspecified: Secondary | ICD-10-CM

## 2024-09-06 NOTE — Progress Notes (Addendum)
 [facility] SNF Admission H&P  Provider: Camellia Door, DO Location:  Other [place].  Nursing Home Room Number: [facility] [id number] Place of Service:  SNF (31)   PCP: Gil Greig BRAVO, NP Patient Care Team: Gil Greig BRAVO, NP as PCP - General (Adult Health Nurse Practitioner) End, Lonni, MD as PCP - Cardiology (Cardiology) Kennyth Chew, MD as PCP - Electrophysiology (Cardiology) Jakie Alm SAUNDERS, MD as Attending Physician (Gastroenterology) Claudene MICAEL Rogerio Mickey., MD (Obstetrics and Gynecology) Jason Charleston, MD (Inactive) (Radiation Oncology) Tona Vinie RAMAN, MD (Hematology and Oncology) Sheril Coy, MD as Consulting Physician (Orthopedic Surgery) Joshua Blamer, MD as Attending Physician (Dermatology) Leslee Reusing, MD as Consulting Physician (Ophthalmology) Shelah Charleston RAMAN, MD as Consulting Physician (Pulmonary Disease)   Extended Emergency Contact Information Primary Emergency Contact: [facility] Address: [address]          [place], [place] [address] [place] Mobile Phone: [phone] Relation: Spouse Secondary Emergency Contact: Mentor,John Address: [address]          [place], [place] [address] [place] Home Phone: [phone] Relation: Son   Goals of Care: Advanced Directive information    08/29/2024    5:28 PM  Advanced Directives  Would patient like information on creating a medical advance directive? No - Patient declined    CODE STATUS: Full Code    Chief Complaint  Patient presents with   New Admit To SNF    Admission.      HPI: Patient is a 83 y.o. female seen today for admission to [facility].   This is a comprehensive admission note to this SNF performed on this date less than 30 days from date of admission. Included are preadmission medical/surgical history; reconciled medication list; family history; social history and comprehensive review of systems.  Corrections and additions to the records were  documented. Comprehensive physical exam was also performed. Additionally a clinical summary was entered for each active diagnosis pertinent to this admission in the Problem List to enhance continuity of care.   83 year old female with a history of chronic hypoxic respiratory failure on home oxygen  at nighttime, COPD, hypertension, dyslipidemia, CKD stage IIIa, who was recently admitted to the hospital from August 29, 2024 through September 03, 2024.  During her hospital stay, she was treated for acute on chronic diastolic heart failure, acute on chronic hypoxic respiratory failure.  She was transferred to the nursing home here at [facility] with the understanding that this was a trial to see if her dementia would get any better.  Patient states that her husband is currently in the ER due to feeling poorly last night.  Patient states that she only wears oxygen  at nighttime.  She has a 30-year pack history.  She stopped smoking about 25 years ago.  Patient has failure to thrive.  Her BMI is 17.7.  She has no peripheral edema now.  She is using a rolling walker.  She hopes to return back to her villa here in [place] independent living when she is finished with skilled rehab.  Nursing has no concerns.  Past Medical History:  Diagnosis Date   Allergy    Arthritis    Breast cancer (HCC)    left breast   Coronary artery disease 2001   CABG   DDD (degenerative disc disease), lumbosacral    Diverticulosis of colon 09/23/2010   Qualifier: Diagnosis of   By: Nelson-Smith CMA (AAMA), Dottie      IMO SNOMED  Dx Update Oct 2024     Dyspnea    if rushing or climmbing lots of stairs   Emphysema lung (HCC)    emphysema   GERD (gastroesophageal reflux disease)    11/20/16- no a current problem   History of breast cancer 07/25/2011   Patient diagnosed with ductal carcinoma on 04/01/89. She underwent left partial mastectomy on 04/10/89. Nodes negative, ER+, PR-. She then was diagnosed with recurrent left  breast cancer (DCIS) and underwent left breast mastectomy on 02/01/2004. Apparently no receptors tested. Tram reconstruction done     Hyperlipidemia    Hypertension    Irritable bowel syndrome (IBS)    Lung disorder    leison   Neuropathy    Pinched nerve    back and left leg   Pneumonia 2016   Past Surgical History:  Procedure Laterality Date   BREAST FIBROADENOMA SURGERY  01/13/1980   BREAST SURGERY  02/01/2004   tram/mastectomyfor recurrence   CARPOMETACARPEL SUSPENSION PLASTY Right 12/17/2017   Procedure: SUSPENSIONPLASTY RIGHT THUMB WITH EXCISION TRAPEZIUM;  Surgeon: Murrell Kuba, MD;  Location: [facility];  Service: Orthopedics;  Laterality: Right;   COLONOSCOPY     CORONARY ANGIOPLASTY  2001   had tear to two vessels- had to have open heart surgery   CORONARY ARTERY BYPASS GRAFT  2001   EYE SURGERY Bilateral    Cataract   KNEE ARTHROSCOPY  January 2011   right   MASTECTOMY PARTIAL / LUMPECTOMY W/ AXILLARY LYMPHADENECTOMY  04/10/1989   Dr Neysa / left breast   PARTIAL HYSTERECTOMY     vaginal   RIGHT/LEFT HEART CATH AND CORONARY ANGIOGRAPHY Bilateral 06/17/2023   Procedure: RIGHT/LEFT HEART CATH AND CORONARY ANGIOGRAPHY;  Surgeon: Mady Bruckner, MD;  Location: [facility];  Service: Cardiovascular;  Laterality: Bilateral;   TENDON TRANSFER Right 12/17/2017   Procedure: ABDUCTOR POLLICIS LONGUS TRANSFER;  Surgeon: Murrell Kuba, MD;  Location:  SURGERY CENTER;  Service: Orthopedics;  Laterality: Right;   TONSILLECTOMY AND ADENOIDECTOMY     TOTAL HIP ARTHROPLASTY Right 08/05/2016   Procedure: TOTAL HIP ARTHROPLASTY ANTERIOR APPROACH;  Surgeon: Maude Herald, MD;  Location: [facility] OR;  Service: Orthopedics;  Laterality: Right;   UPPER GASTROINTESTINAL ENDOSCOPY     VIDEO BRONCHOSCOPY WITH ENDOBRONCHIAL NAVIGATION N/A 11/27/2016   Procedure: VIDEO BRONCHOSCOPY WITH ENDOBRONCHIAL NAVIGATION;  Surgeon: Elspeth JAYSON Millers, MD;  Location: [facility] OR;  Service:  Thoracic;  Laterality: N/A;    reports that she quit smoking about 35 years ago. Her smoking use included cigarettes. She started smoking about 65 years ago. She has never used smokeless tobacco. She reports current alcohol use of about 1.0 standard drink of alcohol per week. She reports that she does not use drugs. Social History   Socioeconomic History   Marital status: Married    Spouse name: Not on file   Number of children: Not on file   Years of education: Not on file   Highest education level: Professional school degree (e.g., MD, DDS, DVM, JD)  Occupational History   Occupation: rn  Tobacco Use   Smoking status: Former    Current packs/day: 0.00    Types: Cigarettes    Start date: 08/01/1959    Quit date: 07/31/1989    Years since quitting: 35.1   Smokeless tobacco: Never  Vaping Use   Vaping status: Never Used  Substance and Sexual Activity   Alcohol use: Yes    Alcohol/week: 1.0 standard drink of alcohol  Types: 1 Glasses of wine per week    Comment: occasional wine   Drug use: No   Sexual activity: Not on file  Other Topics Concern   Not on file  Social History Narrative   Not on file   Social Drivers of Health   Financial Resource Strain: Low Risk  (05/17/2024)   Overall Financial Resource Strain (CARDIA)    Difficulty of Paying Living Expenses: Not hard at all  Food Insecurity: No Food Insecurity (08/29/2024)   Hunger Vital Sign    Worried About Running Out of Food in the Last Year: Never true    Ran Out of Food in the Last Year: Never true  Transportation Needs: No Transportation Needs (08/29/2024)   PRAPARE - Administrator, Civil Service (Medical): No    Lack of Transportation (Non-Medical): No  Physical Activity: Inactive (05/17/2024)   Exercise Vital Sign    Days of Exercise per Week: 0 days    Minutes of Exercise per Session: Not on file  Stress: No Stress Concern Present (05/17/2024)   [employer] of Occupational Health -  Occupational Stress Questionnaire    Feeling of Stress: Not at all  Social Connections: Socially Integrated (08/29/2024)   Social Connection and Isolation Panel    Frequency of Communication with Friends and Family: More than three times a week    Frequency of Social Gatherings with Friends and Family: More than three times a week    Attends Religious Services: More than 4 times per year    Active Member of Clubs or Organizations: Yes    Attends Banker Meetings: More than 4 times per year    Marital Status: Married  Catering Manager Violence: Not At Risk (08/29/2024)   Humiliation, Afraid, Rape, and Kick questionnaire    Fear of Current or Ex-Partner: No    Emotionally Abused: No    Physically Abused: No    Sexually Abused: No     Functional Status Survey:     Family History  Problem Relation Age of Onset   Heart disease Mother        deceased   Emphysema Father        deceased   Colon cancer Son    Colon cancer Paternal Uncle    Colon cancer Paternal Grandmother      Health Maintenance  Topic Date Due   Mammogram  03/04/2008   Medicare Annual Wellness (AWV)  08/05/2023   Influenza Vaccine  05/06/2024   COVID-19 Vaccine (6 - 2025-26 season) 06/06/2024   DTaP/Tdap/Td (3 - Td or Tdap) 08/04/2032   Pneumococcal Vaccine: 50+ Years  Completed   Bone Density Scan  Completed   Zoster Vaccines- Shingrix  Completed   Meningococcal B Vaccine  Aged Out   Colonoscopy  Discontinued     Allergies  Allergen Reactions   Amlodipine  Swelling    Leg swelling and fatigue.  Other Reaction(s): edema/fatigue   Amoxicillin Other (See Comments)    No reaction. Just doesn't work anymore  Other Reaction(s): not effetive-took so many times   Hydromorphone  Other (See Comments)    ALTERED MENTAL STATUS  Altered mental status   Morphine  Other (See Comments)    Pt and family report altered mental status.   Azithromycin Rash   Ciprofloxacin Nausea And Vomiting and Nausea  Only   Hydromorphone  Hcl Other (See Comments)    Altered mental status Altered mental status   Levofloxacin  Nausea Only and Other (See Comments)  Lisinopril Other (See Comments)    Fatigue  Other Reaction(s): fatigue     Outpatient Encounter Medications as of 09/06/2024  Medication Sig   Acetaminophen  Extra Strength 500 MG TABS TAKE 2 TABLETS =1000MG  BY MOUTH TWICE DAILY *DO NOT EXCEED 4GM OF TYLENOL  IN 24 HOURS* (PATIENT REQUEST CAPLET)   albuterol  (VENTOLIN  HFA) 108 (90 Base) MCG/ACT inhaler Inhale 1-2 puffs into the lungs every 6 (six) hours as needed for wheezing or shortness of breath. prn   aspirin  EC 81 MG tablet Take 81 mg by mouth daily. Swallow whole.   atorvastatin  (LIPITOR) 20 MG tablet Take 1 tablet (20 mg total) by mouth daily.   Biotin 89999 MCG TABS Take 1 tablet by mouth daily.   bisacodyl  (DULCOLAX) 5 MG EC tablet Take 1 tablet (5 mg total) by mouth daily as needed for moderate constipation.   carvedilol  (COREG ) 6.25 MG tablet Take 1 tablet (6.25 mg total) by mouth 2 (two) times daily with a meal.   famotidine (PEPCID) 20 MG tablet Take 20 mg by mouth daily as needed for heartburn or indigestion.   feeding supplement (ENSURE PLUS HIGH PROTEIN) LIQD Take 237 mLs by mouth 2 (two) times daily between meals.   fexofenadine (ALLEGRA) 180 MG tablet Take 180 mg by mouth daily.   fish oil-omega-3 fatty acids 1000 MG capsule Take 1 g by mouth daily.    furosemide  (LASIX ) 20 MG tablet Take 0.5 tablets (10 mg total) by mouth daily. Increase to 1 tablet (20 mg total) by mouth ONCE daily (total daily dose 20 mg) as needed for up to 3 days for increased leg swelling, shortness of breath, weight gain 5+ lbs over 1-2 days. Seek medical care if these symptoms are not improving with increased dose.   Glucosamine-Chondroit-Vit C-Mn (GLUCOSAMINE CHONDR 1500 COMPLX PO) Take 1 tablet by mouth once.   Hyoscyamine  Sulfate (HYOSCYAMINE  PO) Take 0.375 mg by mouth every 12 (twelve) hours as needed  (for stomach pain or spasms).   losartan (COZAAR) 25 MG tablet Take 1 tablet (25 mg total) by mouth daily.   mexiletine (MEXITIL ) 150 MG capsule Take 1 capsule (150 mg total) by mouth 2 (two) times daily.   Multiple Vitamin (MULTIVITAMIN) tablet Take 1 tablet by mouth daily.     nitroGLYCERIN  (NITROSTAT ) 0.4 MG SL tablet Place 0.4 mg under the tongue every 5 (five) minutes as needed for chest pain.   OXYGEN  Inhale into the lungs. 2lpm   polyethylene glycol (MIRALAX  / GLYCOLAX ) 17 g packet Take 17 g by mouth daily.   potassium chloride  (KLOR-CON  M) 10 MEQ tablet TAKE 1 TABLET BY MOUTH EVERY OTHER DAY *DO NOT CRUSH OR CHEW* *TAKE WITH FOOD*   sertraline  (ZOLOFT ) 25 MG tablet Take 1 tablet (25 mg total) by mouth daily.   umeclidinium-vilanterol (ANORO ELLIPTA ) 62.5-25 MCG/ACT AEPB INHALE 1 PUFF INTO THE LUNGS BY MOUTH ONCE DAILY   Zinc Oxide (TRIPLE PASTE) 12.8 % ointment Apply 1 Application topically as needed for irritation.   No facility-administered encounter medications on file as of 09/06/2024.     Review of Systems  Constitutional: Negative.   HENT: Negative.    Eyes: Negative.   Respiratory: Negative.    Cardiovascular: Negative.   Gastrointestinal: Negative.   Endocrine: Negative.   Genitourinary: Negative.   Skin: Negative.   Allergic/Immunologic: Negative.   Neurological: Negative.   Hematological: Negative.   Psychiatric/Behavioral: Negative.    All other systems reviewed and are negative.    Vitals:   09/06/24  1205  BP: 137/65  Pulse: (!) 55  Resp: 20  Temp: (!) 97.2 F (36.2 C)  SpO2: 94%  Weight: 106 lb 6.4 oz (48.3 kg)  Height: 5' 5 (1.651 m)   Body mass index is 17.71 kg/m. Physical Exam Vitals and nursing note reviewed.  Constitutional:      General: She is not in acute distress.    Appearance: She is not toxic-appearing or diaphoretic.     Comments: Chronically ill-appearing and frail  HENT:     Head: Normocephalic and atraumatic.  Eyes:      General: No scleral icterus. Cardiovascular:     Rate and Rhythm: Normal rate and regular rhythm.     Pulses: Normal pulses.     Heart sounds: Murmur heard.     Comments: Soft 1 out of 6 systolic ejection murmur left upper sternal border. Pulmonary:     Effort: Pulmonary effort is normal. No respiratory distress.     Breath sounds: No wheezing.  Abdominal:     General: Abdomen is flat. Bowel sounds are normal. There is no distension.     Palpations: Abdomen is soft.  Musculoskeletal:     Right lower leg: No edema.     Left lower leg: No edema.  Skin:    General: Skin is warm and dry.     Capillary Refill: Capillary refill takes less than 2 seconds.  Neurological:     General: No focal deficit present.     Mental Status: She is alert and oriented to person, place, and time.      Labs reviewed: Basic Metabolic Panel: Recent Labs    01/10/24 1959 01/11/24 1243 01/12/24 0600 08/30/24 0446 09/01/24 0610 09/02/24 0543  NA 138 138   < > 135 136 137  K 3.6 3.9   < > 3.5 3.6 3.7  CL 102 101   < > 94* 96* 97*  CO2 27 28   < > 30 33* 30  GLUCOSE 93 98   < > 108* 99 99  BUN 19 20   < > 19 25* 26*  CREATININE 0.71 0.91   < > 0.91 0.92 0.90  CALCIUM  10.3 10.8*   < > 10.2 9.7 10.1  MG 2.1 2.0  --   --   --   --   PHOS  --  3.1  --   --   --   --    < > = values in this interval not displayed.   Liver Function Tests: Recent Labs    01/12/24 2049 01/14/24 0538 06/20/24 0905 08/29/24 1311  AST 45* 53* 39* 28  ALT 28 29 26 11   ALKPHOS 100 90  --  97  BILITOT 1.0 1.4* 0.6 0.9  PROT 6.9 6.5 7.0 6.7  ALBUMIN 3.7 3.3*  --  3.4*   Recent Labs    11/05/23 1442  LIPASE 44    CBC: Recent Labs    01/11/24 1243 01/12/24 2049 03/15/24 1516 06/20/24 0905 08/29/24 1127  WBC 5.6   < > 3.8* 4.3 8.0  NEUTROABS 4.1  --   --  2,662 6.6  HGB 15.0   < > 15.0 13.8 13.6  HCT 44.7   < > 45.6 42.4 41.1  MCV 99.8   < > 100.4* 100.0 100.2*  PLT 136*   < > 135* 152 114*   < > =  values in this interval not displayed.    Lab Results  Component Value Date  HGBA1C 6.2 (H) 06/20/2024   Lab Results  Component Value Date   TSH 3.10 06/20/2024   Lab Results  Component Value Date   VITAMINB12 477 06/20/2024   Lab Results  Component Value Date   FOLATE >20.0 ng/mL 09/24/2010   Lab Results  Component Value Date   IRON 85 09/24/2010     Imaging and Procedures obtained prior to SNF admission: DG Chest 2 View Result Date: 08/29/2024 EXAM: 2 VIEW(S) XRAY OF THE CHEST 08/29/2024 11:54:45 AM COMPARISON: 03/15/2024 CLINICAL HISTORY: SOB (shortness of breath) FINDINGS: LUNGS AND PLEURA: The chin overlies the apices on the frontal radiograph. Lower lung predominant interstitial prominence and indistinctness. No lobar consolidation. No pleural effusion. No pneumothorax. HEART AND MEDIASTINUM: Moderate cardiomegaly. Transverse aortic atherosclerosis. BONES AND SOFT TISSUES: Median sternotomy for CABG. Left axillary node dissection. No acute osseous abnormality. IMPRESSION: 1. Cardiomegaly, moderate, without lobar consolidation. 2. Lower lung predominant interstitial prominence and indistinctness, likely interstitial edema. Electronically signed by: Rockey Kilts MD 08/29/2024 12:22 PM EST RP Workstation: [id number]     Assessment/Plan COPD (chronic obstructive pulmonary disease) (HCC) Continue with Anoro Ellipta  inhaler qday, prn albuterol  inhaler  Debility Continue working with PT in hopes she can rehab and go home back to independent living.  Chronic hypoxic respiratory failure, on home oxygen  therapy (HCC) - Nocturnal 2 L/min Continue with nocturnal O2 @ 2 L/min  Mild dementia with anxiety (HCC) Stable. Pt is AxOx4 today. Hopefully she can return home with her husband after acute rehab. Continue with Zoloft .  Appears she was discharged from hospital on 25 mg daily but she is on 75 mg daily currently. Will change back to 25 mg daily.   Irritable bowel syndrome  (IBS) Pt c/o severe abd cramping intermittently. When she has a BM, her abd cramping improves. Try bentyl 10 mg qid prn abd cramping.  Essential hypertension Stable BP.  Continue coreg  6.25 mg bid, losartan 25 mg daily, lasix  10 mg daily. Kcl 10 meq every other day.  Chronic heart failure with preserved ejection fraction (HFpEF) (HCC) Stable BP.  Continue coreg  6.25 mg bid, losartan 25 mg daily, lasix  10 mg daily. Kcl 10 meq every other day.  Dyslipidemia Continue lipitor 20 mg daily  Chronic kidney disease, stage 3a (HCC) Stable. Last Scr on 09-02-2024 of 0.9.  BUN of 26  Coronary artery disease Stable. On lipitor 20 mg daily. ASA 81 mg daily.   Family/ staff Communication: no family at bedside    Camellia Door, DO  [facility] [phone]

## 2024-09-06 NOTE — Assessment & Plan Note (Signed)
 Stable. On lipitor 20 mg daily. ASA 81 mg daily.

## 2024-09-06 NOTE — Assessment & Plan Note (Signed)
 Stable. Last Scr on 09-02-2024 of 0.9.  BUN of 26

## 2024-09-06 NOTE — Assessment & Plan Note (Signed)
 Continue with nocturnal O2 @ 2 L/min

## 2024-09-06 NOTE — Assessment & Plan Note (Signed)
Continue lipitor 20mg daily  

## 2024-09-06 NOTE — Assessment & Plan Note (Signed)
 Continue working with PT in hopes she can rehab and go home back to independent living.

## 2024-09-06 NOTE — Assessment & Plan Note (Signed)
 Pt c/o severe abd cramping intermittently. When she has a BM, her abd cramping improves. Try bentyl 10 mg qid prn abd cramping.

## 2024-09-06 NOTE — Assessment & Plan Note (Signed)
 Stable BP.  Continue coreg  6.25 mg bid, losartan 25 mg daily, lasix  10 mg daily. Kcl 10 meq every other day.

## 2024-09-06 NOTE — Assessment & Plan Note (Addendum)
 Stable. Pt is AxOx4 today. Hopefully she can return home with her husband after acute rehab. Continue with Zoloft .  Appears she was discharged from hospital on 25 mg daily but she is on 75 mg daily currently. Will change back to 25 mg daily.

## 2024-09-06 NOTE — Assessment & Plan Note (Signed)
 Continue with Anoro Ellipta  inhaler qday, prn albuterol  inhaler

## 2024-09-07 DIAGNOSIS — J9611 Chronic respiratory failure with hypoxia: Secondary | ICD-10-CM | POA: Diagnosis not present

## 2024-09-08 LAB — BASIC METABOLIC PANEL WITH GFR
BUN: 22 — AB (ref 4–21)
CO2: 30 — AB (ref 13–22)
Chloride: 101 (ref 99–108)
Creatinine: 0.9 (ref 0.5–1.1)
Glucose: 85
Potassium: 4.7 meq/L (ref 3.5–5.1)
Sodium: 139 (ref 137–147)

## 2024-09-08 LAB — COMPREHENSIVE METABOLIC PANEL WITH GFR
Calcium: 10.6 (ref 8.7–10.7)
eGFR: 61

## 2024-09-08 LAB — CBC AND DIFFERENTIAL
HCT: 42 (ref 36–46)
Hemoglobin: 13.9 (ref 12.0–16.0)
Neutrophils Absolute: 5126
Platelets: 245 K/uL (ref 150–400)
WBC: 6.4

## 2024-09-08 LAB — CBC: RBC: 4.2 (ref 3.87–5.11)

## 2024-09-16 ENCOUNTER — Encounter: Payer: Self-pay | Admitting: Orthopedic Surgery

## 2024-09-16 ENCOUNTER — Non-Acute Institutional Stay (SKILLED_NURSING_FACILITY): Payer: Self-pay | Admitting: Orthopedic Surgery

## 2024-09-16 DIAGNOSIS — I5032 Chronic diastolic (congestive) heart failure: Secondary | ICD-10-CM

## 2024-09-16 DIAGNOSIS — N1831 Chronic kidney disease, stage 3a: Secondary | ICD-10-CM

## 2024-09-16 DIAGNOSIS — J449 Chronic obstructive pulmonary disease, unspecified: Secondary | ICD-10-CM

## 2024-09-16 DIAGNOSIS — I10 Essential (primary) hypertension: Secondary | ICD-10-CM

## 2024-09-16 NOTE — Progress Notes (Signed)
 Location:  Other [facility]) Nursing Home Room Number: [facility] 116-A Place of Service:  SNF [phone]) Provider:  Gil Greig Tiffany Caldwell Gil Greig FORBES, NP  Patient Care Team: Gil Greig FORBES, NP as PCP - General (Adult Health Nurse Practitioner) End, Lonni, MD as PCP - Cardiology (Cardiology) Kennyth Chew, MD as PCP - Electrophysiology (Cardiology) Jakie Alm SAUNDERS, MD as Attending Physician (Gastroenterology) Claudene MICAEL Rogerio Mickey., MD (Obstetrics and Gynecology) Jason Charleston, MD (Inactive) (Radiation Oncology) Tona Vinie RAMAN, MD (Hematology and Oncology) Sheril Coy, MD as Consulting Physician (Orthopedic Surgery) Joshua Blamer, MD as Attending Physician (Dermatology) Leslee Reusing, MD as Consulting Physician (Ophthalmology) Shelah Charleston RAMAN, MD as Consulting Physician (Pulmonary Disease)  Extended Emergency Contact Information Primary Emergency Contact: [facility] Address: [address]          [place], [place] [address] [place] Mobile Phone: [phone] Relation: Spouse Secondary Emergency Contact: Hobby,John Address: [address]          [place], [place] [address] [place] Home Phone: [phone] Relation: Son  Code Status:  FULL Goals of care: Advanced Directive information    09/16/2024    2:20 PM  Advanced Directives  Does Patient Have a Medical Advance Directive? Yes  Type of Estate Agent of [place];Living will  Does patient want to make changes to medical advance directive? No - Patient declined  Copy of Healthcare Power of Attorney in Chart? No - copy requested  Would patient like information on creating a medical advance directive? No - Patient declined     Chief Complaint  Patient presents with   Hypertension    Elevated blood pressure.    HPI:  Pt is a 83 y.o. female seen today for an acute visit due to elevated blood pressure.   She currently resides on the rehab unit at [facility]. PMH: atrial fibrillation, atherosclerosis, CHF, HTN, HLD, pulmonary HTN, COPD, emphysema, GERD, IBS, primary hyperparathyroidism, dementia, DDD, osteopenia, CKD, thrombocytopenia, right lung mass and unstable gait.   Recent blood pressures elevated per nursing. See trends below. SBP was 190/90, manual rechecked 165/95. She did receive carvedilol , losartan  and furosemide . Denies generalized pain, chest pain, sob, headaches or blurred vision.   Recent blood pressures:  12/12- 180/74  12/11- 177/82, 144/61  12/10- 174/72   Past Medical History:  Diagnosis Date   Allergy    Arthritis    Breast cancer (HCC)    left breast   Coronary artery disease 2001   CABG   DDD (degenerative disc disease), lumbosacral    Diverticulosis of colon 09/23/2010   Qualifier: Diagnosis of   By: Marcelo CMA (AAMA), Dottie      IMO SNOMED Dx Update Oct 2024     Dyspnea    if rushing or climmbing lots of stairs   Emphysema lung (HCC)    emphysema   GERD (gastroesophageal reflux disease)    11/20/16- no a current problem   History of breast cancer 07/25/2011   Patient diagnosed with ductal carcinoma on 04/01/89. She underwent left partial mastectomy on 04/10/89. Nodes negative, ER+, PR-. She then was diagnosed with recurrent left breast cancer (DCIS) and underwent left breast mastectomy on 02/01/2004. Apparently no receptors tested. Tram reconstruction done     Hyperlipidemia    Hypertension    Irritable bowel syndrome (IBS)    Lung disorder    leison   Neuropathy    Pinched nerve    back and left leg  Pneumonia 2016   Past Surgical History:  Procedure Laterality Date   BREAST FIBROADENOMA SURGERY  01/13/1980   BREAST SURGERY  02/01/2004   tram/mastectomyfor recurrence   CARPOMETACARPEL SUSPENSION PLASTY Right 12/17/2017   Procedure: SUSPENSIONPLASTY RIGHT THUMB WITH EXCISION TRAPEZIUM;  Surgeon: Murrell Kuba, MD;  Location: [facility];  Service: Orthopedics;  Laterality: Right;    COLONOSCOPY     CORONARY ANGIOPLASTY  2001   had tear to two vessels- had to have open heart surgery   CORONARY ARTERY BYPASS GRAFT  2001   EYE SURGERY Bilateral    Cataract   KNEE ARTHROSCOPY  January 2011   right   MASTECTOMY PARTIAL / LUMPECTOMY W/ AXILLARY LYMPHADENECTOMY  04/10/1989   Dr Neysa / left breast   PARTIAL HYSTERECTOMY     vaginal   RIGHT/LEFT HEART CATH AND CORONARY ANGIOGRAPHY Bilateral 06/17/2023   Procedure: RIGHT/LEFT HEART CATH AND CORONARY ANGIOGRAPHY;  Surgeon: Mady Bruckner, MD;  Location: [facility];  Service: Cardiovascular;  Laterality: Bilateral;   TENDON TRANSFER Right 12/17/2017   Procedure: ABDUCTOR POLLICIS LONGUS TRANSFER;  Surgeon: Murrell Kuba, MD;  Location: [facility];  Service: Orthopedics;  Laterality: Right;   TONSILLECTOMY AND ADENOIDECTOMY     TOTAL HIP ARTHROPLASTY Right 08/05/2016   Procedure: TOTAL HIP ARTHROPLASTY ANTERIOR APPROACH;  Surgeon: Maude Herald, MD;  Location: [facility] OR;  Service: Orthopedics;  Laterality: Right;   UPPER GASTROINTESTINAL ENDOSCOPY     VIDEO BRONCHOSCOPY WITH ENDOBRONCHIAL NAVIGATION N/A 11/27/2016   Procedure: VIDEO BRONCHOSCOPY WITH ENDOBRONCHIAL NAVIGATION;  Surgeon: Elspeth JAYSON Millers, MD;  Location: [facility] OR;  Service: Thoracic;  Laterality: N/A;    Allergies[1]  Outpatient Encounter Medications as of 09/16/2024  Medication Sig   Acetaminophen  Extra Strength 500 MG TABS TAKE 2 TABLETS =1000MG  BY MOUTH TWICE DAILY *DO NOT EXCEED 4GM OF TYLENOL  IN 24 HOURS* (PATIENT REQUEST CAPLET)   albuterol  (VENTOLIN  HFA) 108 (90 Base) MCG/ACT inhaler Inhale 1-2 puffs into the lungs every 6 (six) hours as needed for wheezing or shortness of breath. prn   aspirin  EC 81 MG tablet Take 81 mg by mouth daily. Swallow whole.   atorvastatin  (LIPITOR) 20 MG tablet Take 1 tablet (20 mg total) by mouth daily.   Biotin 89999 MCG TABS Take 1 tablet by mouth daily.   bisacodyl  (DULCOLAX) 5 MG EC tablet Take 1 tablet  (5 mg total) by mouth daily as needed for moderate constipation.   carvedilol  (COREG ) 6.25 MG tablet Take 1 tablet (6.25 mg total) by mouth 2 (two) times daily with a meal.   DICYCLOMINE HCL PO Take 10 mg by mouth every 6 (six) hours as needed.   famotidine (PEPCID) 20 MG tablet Take 20 mg by mouth daily as needed for heartburn or indigestion.   feeding supplement (ENSURE PLUS HIGH PROTEIN) LIQD Take 237 mLs by mouth 2 (two) times daily between meals.   fexofenadine (ALLEGRA) 180 MG tablet Take 180 mg by mouth daily.   fish oil-omega-3 fatty acids 1000 MG capsule Take 1 g by mouth daily.    furosemide  (LASIX ) 20 MG tablet Take 0.5 tablets (10 mg total) by mouth daily. Increase to 1 tablet (20 mg total) by mouth ONCE daily (total daily dose 20 mg) as needed for up to 3 days for increased leg swelling, shortness of breath, weight gain 5+ lbs over 1-2 days. Seek medical care if these symptoms are not improving with increased dose.   Glucosamine-Chondroit-Vit C-Mn (GLUCOSAMINE CHONDR 1500 COMPLX PO)  Take 1 tablet by mouth once.   Hyoscyamine  Sulfate (HYOSCYAMINE  PO) Take 0.375 mg by mouth every 12 (twelve) hours as needed (for stomach pain or spasms).   losartan  (COZAAR ) 25 MG tablet Take 1 tablet (25 mg total) by mouth daily.   mexiletine (MEXITIL ) 150 MG capsule Take 1 capsule (150 mg total) by mouth 2 (two) times daily.   Multiple Vitamin (MULTIVITAMIN) tablet Take 1 tablet by mouth daily.     nitroGLYCERIN  (NITROSTAT ) 0.4 MG SL tablet Place 0.4 mg under the tongue every 5 (five) minutes as needed for chest pain.   OXYGEN  Inhale into the lungs. 2lpm   polyethylene glycol (MIRALAX  / GLYCOLAX ) 17 g packet Take 17 g by mouth daily.   potassium chloride  (KLOR-CON  M) 10 MEQ tablet TAKE 1 TABLET BY MOUTH EVERY OTHER DAY *DO NOT CRUSH OR CHEW* *TAKE WITH FOOD*   sertraline  (ZOLOFT ) 25 MG tablet Take 1 tablet (25 mg total) by mouth daily.   umeclidinium-vilanterol (ANORO ELLIPTA ) 62.5-25 MCG/ACT AEPB INHALE  1 PUFF INTO THE LUNGS BY MOUTH ONCE DAILY   Zinc Oxide (TRIPLE PASTE) 12.8 % ointment Apply 1 Application topically as needed for irritation.   No facility-administered encounter medications on file as of 09/16/2024.    Review of Systems  Immunization History  Administered Date(s) Administered   Fluad Trivalent(High Dose 65+) 07/22/2023   Fluzone Influenza virus vaccine,trivalent (IIV3), split virus 07/17/2009, 07/07/2011, 07/28/2013, 07/17/2014, 07/26/2015, 07/18/2016, 07/27/2017   INFLUENZA, HIGH DOSE SEASONAL PF 07/01/2018, 08/02/2018, 08/07/2019, 08/06/2021   Influenza,inj,Quad PF,6+ Mos 07/26/2015   Influenza-Unspecified 08/15/2014, 07/08/2019, 07/04/2022   Moderna Covid-19 Vaccine Bivalent Booster 2yrs & up 07/19/2021, 08/06/2021   Moderna SARS-COV2 Booster Vaccination 08/17/2020   Moderna Sars-Covid-2 Vaccination 10/18/2019, 11/15/2019, 01/22/2021   PNEUMOCOCCAL CONJUGATE-20 08/06/2023   Pneumococcal Conjugate-13 05/22/2014   Pneumococcal Polysaccharide-23 01/24/2005, 07/01/2018   Pneumococcal-Unspecified 06/15/2014   RSV,unspecified 07/04/2022   Td 08/04/2022   Td (Adult) 12/27/2001   Tdap 05/12/2012   Unspecified SARS-COV-2 Vaccination 08/29/2022   Zoster Recombinant(Shingrix) 03/20/2018, 08/22/2018   Zoster, Live 08/10/2006, 03/20/2018, 08/24/2018   Pertinent  Health Maintenance Due  Topic Date Due   Mammogram  03/04/2008   Influenza Vaccine  05/06/2024   Bone Density Scan  Completed   Colonoscopy  Discontinued      07/21/2019    6:45 PM 08/04/2019   12:02 AM 08/11/2022    5:49 PM 05/18/2024    1:46 PM 08/03/2024    2:26 PM  Fall Risk  Falls in the past year?    0 0  Was there an injury with Fall?    0  0   Fall Risk Category Calculator    0 0  (RETIRED) Patient Fall Risk Level Low fall risk  Low fall risk  Low fall risk     Patient at Risk for Falls Due to    Impaired balance/gait;Impaired mobility Impaired balance/gait;Impaired mobility  Fall risk Follow  up    Falls evaluation completed Falls evaluation completed     Data saved with a previous flowsheet row definition   Functional Status Survey:    Vitals:   09/16/24 1418 09/16/24 1420  BP: (!) 177/82 (!) 144/61  Pulse: (!) 58   Resp: 18   Temp: (!) 97.5 F (36.4 C)   SpO2: 94%   Weight: 107 lb 3.2 oz (48.6 kg)   Height: 5' 5 (1.651 m)    Body mass index is 17.84 kg/m. Physical Exam Vitals reviewed.  Constitutional:  General: She is not in acute distress. HENT:     Head: Normocephalic.  Eyes:     General:        Right eye: No discharge.        Left eye: No discharge.  Cardiovascular:     Rate and Rhythm: Normal rate and regular rhythm.     Pulses: Normal pulses.     Heart sounds: Normal heart sounds.  Pulmonary:     Effort: Pulmonary effort is normal. No respiratory distress.     Breath sounds: Normal breath sounds. No wheezing or rales.  Abdominal:     General: Bowel sounds are normal. There is no distension.     Palpations: Abdomen is soft.     Tenderness: There is no abdominal tenderness.  Musculoskeletal:     Cervical back: Neck supple.     Right lower leg: Edema present.     Left lower leg: Edema present.     Comments: Non pitting  Skin:    General: Skin is warm.     Capillary Refill: Capillary refill takes less than 2 seconds.  Neurological:     General: No focal deficit present.     Mental Status: She is alert. Mental status is at baseline.     Gait: Gait abnormal.  Psychiatric:        Mood and Affect: Mood normal.     Labs reviewed: Recent Labs    01/10/24 1959 01/11/24 1243 01/12/24 0600 08/30/24 0446 09/01/24 0610 09/02/24 0543 09/08/24 0000  NA 138 138   < > 135 136 137 139  K 3.6 3.9   < > 3.5 3.6 3.7 4.7  CL 102 101   < > 94* 96* 97* 101  CO2 27 28   < > 30 33* 30 30*  GLUCOSE 93 98   < > 108* 99 99  --   BUN 19 20   < > 19 25* 26* 22*  CREATININE 0.71 0.91   < > 0.91 0.92 0.90 0.9  CALCIUM  10.3 10.8*   < > 10.2 9.7 10.1  10.6  MG 2.1 2.0  --   --   --   --   --   PHOS  --  3.1  --   --   --   --   --    < > = values in this interval not displayed.   Recent Labs    01/12/24 2049 01/14/24 0538 06/20/24 0905 08/29/24 1311  AST 45* 53* 39* 28  ALT 28 29 26 11   ALKPHOS 100 90  --  97  BILITOT 1.0 1.4* 0.6 0.9  PROT 6.9 6.5 7.0 6.7  ALBUMIN 3.7 3.3*  --  3.4*   Recent Labs    03/15/24 1516 06/20/24 0905 08/29/24 1127 09/08/24 0000  WBC 3.8* 4.3 8.0 6.4  NEUTROABS  --  2,662 6.6 5,126.00  HGB 15.0 13.8 13.6 13.9  HCT 45.6 42.4 41.1 42  MCV 100.4* 100.0 100.2*  --   PLT 135* 152 114* 245   Lab Results  Component Value Date   TSH 3.10 06/20/2024   Lab Results  Component Value Date   HGBA1C 6.2 (H) 06/20/2024   Lab Results  Component Value Date   CHOL 180 06/20/2024   HDL 66 06/20/2024   LDLCALC 95 06/20/2024   TRIG 93 06/20/2024   CHOLHDL 2.7 06/20/2024    Significant Diagnostic Results in last 30 days:  DG Chest 2 View Result Date: 08/29/2024 EXAM:  2 VIEW(S) XRAY OF THE CHEST 08/29/2024 11:54:45 AM COMPARISON: 03/15/2024 CLINICAL HISTORY: SOB (shortness of breath) FINDINGS: LUNGS AND PLEURA: The chin overlies the apices on the frontal radiograph. Lower lung predominant interstitial prominence and indistinctness. No lobar consolidation. No pleural effusion. No pneumothorax. HEART AND MEDIASTINUM: Moderate cardiomegaly. Transverse aortic atherosclerosis. BONES AND SOFT TISSUES: Median sternotomy for CABG. Left axillary node dissection. No acute osseous abnormality. IMPRESSION: 1. Cardiomegaly, moderate, without lobar consolidation. 2. Lower lung predominant interstitial prominence and indistinctness, likely interstitial edema. Electronically signed by: Rockey Kilts MD 08/29/2024 12:22 PM EST RP Workstation: [id number]    Assessment/Plan 1. Essential hypertension (Primary) - uncontrolled, goal < 150/90 - BUN/creat 22/0.9, GFR 61 09/08/2024 - will increase losartan  to 50 mg  - cont  carvedilol   2. Chronic kidney disease, stage 3a (HCC) - GFR 61 ( 12/04> was 66 (07/28) - encourage hydration with water - avoid NSAIDS  3. Chronic obstructive pulmonary disease, unspecified COPD type (HCC) - no recent exacerbations - cont albuterol  prn and Anoro Ellipta    4. Chronic heart failure with preserved ejection fraction (HFpEF) (HCC) - hospitalized 11/24-11/29 for acte exacerbation - BNP 3179 08/29/2024 - lung sounds clear, non pitting edema, weight stable - cont furosemide      Family/ staff Communication: plan discussed with patient and nurse  Labs/tests ordered:  bmp in 2 weeks     [1]  Allergies Allergen Reactions   Amlodipine  Swelling    Leg swelling and fatigue.  Other Reaction(s): edema/fatigue   Amoxicillin Other (See Comments)    No reaction. Just doesn't work anymore  Other Reaction(s): not effetive-took so many times   Hydromorphone  Other (See Comments)    ALTERED MENTAL STATUS  Altered mental status   Morphine  Other (See Comments)    Pt and family report altered mental status.   Azithromycin Rash   Ciprofloxacin Nausea And Vomiting and Nausea Only   Hydromorphone  Hcl Other (See Comments)    Altered mental status Altered mental status   Levofloxacin  Nausea Only and Other (See Comments)   Lisinopril Other (See Comments)    Fatigue  Other Reaction(s): fatigue

## 2024-09-19 MED ORDER — LOSARTAN POTASSIUM 50 MG PO TABS: 50.0000 mg | ORAL_TABLET | Freq: Every day | ORAL | Status: AC

## 2024-10-13 ENCOUNTER — Ambulatory Visit: Admitting: Emergency Medicine

## 2024-10-13 ENCOUNTER — Encounter: Payer: Self-pay | Admitting: Nurse Practitioner

## 2024-10-13 ENCOUNTER — Non-Acute Institutional Stay (SKILLED_NURSING_FACILITY): Admitting: Nurse Practitioner

## 2024-10-13 DIAGNOSIS — J449 Chronic obstructive pulmonary disease, unspecified: Secondary | ICD-10-CM | POA: Diagnosis not present

## 2024-10-13 DIAGNOSIS — I5032 Chronic diastolic (congestive) heart failure: Secondary | ICD-10-CM | POA: Diagnosis not present

## 2024-10-13 DIAGNOSIS — Z9981 Dependence on supplemental oxygen: Secondary | ICD-10-CM | POA: Diagnosis not present

## 2024-10-13 DIAGNOSIS — M542 Cervicalgia: Secondary | ICD-10-CM | POA: Diagnosis not present

## 2024-10-13 DIAGNOSIS — I480 Paroxysmal atrial fibrillation: Secondary | ICD-10-CM

## 2024-10-13 DIAGNOSIS — I10 Essential (primary) hypertension: Secondary | ICD-10-CM | POA: Diagnosis not present

## 2024-10-13 DIAGNOSIS — N1831 Chronic kidney disease, stage 3a: Secondary | ICD-10-CM

## 2024-10-13 DIAGNOSIS — F03B4 Unspecified dementia, moderate, with anxiety: Secondary | ICD-10-CM

## 2024-10-13 DIAGNOSIS — J9611 Chronic respiratory failure with hypoxia: Secondary | ICD-10-CM | POA: Diagnosis not present

## 2024-10-13 DIAGNOSIS — E785 Hyperlipidemia, unspecified: Secondary | ICD-10-CM

## 2024-10-13 NOTE — Assessment & Plan Note (Signed)
 Continue with nocturnal O2 @ 2 L/min

## 2024-10-13 NOTE — Assessment & Plan Note (Signed)
 Continue lipitor 20 mg daily. LDL at goal

## 2024-10-13 NOTE — Assessment & Plan Note (Signed)
-  Stable, no acute changes in cognitive or functional status, continue supportive care.  Plan for long term care.

## 2024-10-13 NOTE — Assessment & Plan Note (Signed)
 Rate controlled  Continue Coreg  On low-dose Eliquis 

## 2024-10-13 NOTE — Assessment & Plan Note (Signed)
 Stable, Continue with Anoro Ellipta  inhaler qday, prn albuterol  inhaler

## 2024-10-13 NOTE — Assessment & Plan Note (Signed)
 Euvolemic, continue on coreg  6.25 mg bid, losartan  25 mg daily, lasix  10 mg daily. Kcl 10 meq every other day.

## 2024-10-13 NOTE — Progress Notes (Signed)
 " Location:  Other [facility] [place].  Nursing Home Room Number: [facility] [id number] Place of Service:  SNF [phone]) Tiffany An, NP  PCP: Laurence Locus, DO  Patient Care Team: Laurence Locus, DO as PCP - General (Internal Medicine) Tiffany Alm SAUNDERS, MD as Attending Physician (Gastroenterology) Claudene MICAEL Rogerio Mickey., MD (Obstetrics and Gynecology) Jason Charleston, MD (Inactive) (Radiation Oncology) Tona Vinie RAMAN, MD (Hematology and Oncology) Sheril Coy, MD as Consulting Physician (Orthopedic Surgery) Joshua Blamer, MD as Attending Physician (Dermatology) Leslee Reusing, MD as Consulting Physician (Ophthalmology) Shelah Charleston RAMAN, MD as Consulting Physician (Pulmonary Disease)  Extended Emergency Contact Information Primary Emergency Contact: Limb,John Address: [address]          [place], [place] [address] [place] Home Phone: [phone] Relation: Son Secondary Emergency Contact: Smith,Karen  [place] Home Phone: [phone] Relation: Daughter  Goals of care: Advanced Directive information    09/16/2024    2:20 PM  Advanced Directives  Does Patient Have a Medical Advance Directive? Yes  Type of Estate Agent of [place];Living will  Does patient want to make changes to medical advance directive? No - Patient declined  Copy of Healthcare Power of Attorney in Chart? No - copy requested  Would patient like information on creating a medical advance directive? No - Patient declined     Chief Complaint  Patient presents with   Medical Management of Chronic Issues    Medical Management of Chronic Issues    HPI:  Tiffany Caldwell is a 84 y.o. female seen today for medical management of chronic disease. Tiffany Caldwell with hx of dementia, a fib, htn, COPD.  She is planning to stay at [facility] for long term care.  Her husband recently passed away and she is doing well with this.  Denies worsening anxiety or depression Staff reports she has been  complaining of neck pain frequently.  She reports soreness at time of visit and reports certain pillows/positions make it worse.  Weight has been stable.  Denies worsening of shortness of breath, cough or congestion.     Past Medical History:  Diagnosis Date   Allergy    Arthritis    Breast cancer (HCC)    left breast   Coronary artery disease 2001   CABG   DDD (degenerative disc disease), lumbosacral    Diverticulosis of colon 09/23/2010   Qualifier: Diagnosis of   By: Marcelo CMA LEODIS), Dottie      IMO SNOMED Dx Update Oct 2024     Dyspnea    if rushing or climmbing lots of stairs   Emphysema lung (HCC)    emphysema   GERD (gastroesophageal reflux disease)    11/20/16- no a current problem   History of breast cancer 07/25/2011   Patient diagnosed with ductal carcinoma on 04/01/89. She underwent left partial mastectomy on 04/10/89. Nodes negative, ER+, PR-. She then was diagnosed with recurrent left breast cancer (DCIS) and underwent left breast mastectomy on 02/01/2004. Apparently no receptors tested. Tram reconstruction done     Hyperlipidemia    Hypertension    Irritable bowel syndrome (IBS)    Lung disorder    leison   Neuropathy    Pinched nerve    back and left leg   Pneumonia 2016   Past Surgical History:  Procedure Laterality Date   BREAST FIBROADENOMA SURGERY  01/13/1980   BREAST SURGERY  02/01/2004   tram/mastectomyfor recurrence   CARPOMETACARPEL SUSPENSION PLASTY Right 12/17/2017   Procedure:  SUSPENSIONPLASTY RIGHT THUMB WITH EXCISION TRAPEZIUM;  Surgeon: Murrell Kuba, MD;  Location: [facility];  Service: Orthopedics;  Laterality: Right;   COLONOSCOPY     CORONARY ANGIOPLASTY  2001   had tear to two vessels- had to have open heart surgery   CORONARY ARTERY BYPASS GRAFT  2001   EYE SURGERY Bilateral    Cataract   KNEE ARTHROSCOPY  January 2011   right   MASTECTOMY PARTIAL / LUMPECTOMY W/ AXILLARY LYMPHADENECTOMY  04/10/1989   Dr Neysa / left  breast   PARTIAL HYSTERECTOMY     vaginal   RIGHT/LEFT HEART CATH AND CORONARY ANGIOGRAPHY Bilateral 06/17/2023   Procedure: RIGHT/LEFT HEART CATH AND CORONARY ANGIOGRAPHY;  Surgeon: Mady Bruckner, MD;  Location: [facility];  Service: Cardiovascular;  Laterality: Bilateral;   TENDON TRANSFER Right 12/17/2017   Procedure: ABDUCTOR POLLICIS LONGUS TRANSFER;  Surgeon: Murrell Kuba, MD;  Location: [facility];  Service: Orthopedics;  Laterality: Right;   TONSILLECTOMY AND ADENOIDECTOMY     TOTAL HIP ARTHROPLASTY Right 08/05/2016   Procedure: TOTAL HIP ARTHROPLASTY ANTERIOR APPROACH;  Surgeon: Maude Herald, MD;  Location: [facility];  Service: Orthopedics;  Laterality: Right;   UPPER GASTROINTESTINAL ENDOSCOPY     VIDEO BRONCHOSCOPY WITH ENDOBRONCHIAL NAVIGATION N/A 11/27/2016   Procedure: VIDEO BRONCHOSCOPY WITH ENDOBRONCHIAL NAVIGATION;  Surgeon: Elspeth JAYSON Millers, MD;  Location: [facility] OR;  Service: Thoracic;  Laterality: N/A;    Allergies[1]  Outpatient Encounter Medications as of 10/13/2024  Medication Sig   Acetaminophen  Extra Strength 500 MG TABS TAKE 2 TABLETS =1000MG  BY MOUTH TWICE DAILY *DO NOT EXCEED 4GM OF TYLENOL  IN 24 HOURS* (PATIENT REQUEST CAPLET)   albuterol  (VENTOLIN  HFA) 108 (90 Base) MCG/ACT inhaler Inhale 1-2 puffs into the lungs every 6 (six) hours as needed for wheezing or shortness of breath. prn   aspirin  EC 81 MG tablet Take 81 mg by mouth daily. Swallow whole.   atorvastatin  (LIPITOR) 20 MG tablet Take 1 tablet (20 mg total) by mouth daily.   Biotin 89999 MCG TABS Take 1 tablet by mouth daily.   bisacodyl  (DULCOLAX) 5 MG EC tablet Take 1 tablet (5 mg total) by mouth daily as needed for moderate constipation.   carvedilol  (COREG ) 6.25 MG tablet Take 1 tablet (6.25 mg total) by mouth 2 (two) times daily with a meal.   DICYCLOMINE HCL PO Take 10 mg by mouth every 6 (six) hours as needed.   famotidine (PEPCID) 20 MG tablet Take 20 mg by mouth daily as  needed for heartburn or indigestion.   feeding supplement (ENSURE PLUS HIGH PROTEIN) LIQD Take 237 mLs by mouth 2 (two) times daily between meals.   fexofenadine (ALLEGRA) 180 MG tablet Take 180 mg by mouth daily.   furosemide  (LASIX ) 20 MG tablet Take 0.5 tablets (10 mg total) by mouth daily. Increase to 1 tablet (20 mg total) by mouth ONCE daily (total daily dose 20 mg) as needed for up to 3 days for increased leg swelling, shortness of breath, weight gain 5+ lbs over 1-2 days. Seek medical care if these symptoms are not improving with increased dose.   Hyoscyamine  Sulfate (HYOSCYAMINE  PO) Take 0.375 mg by mouth every 12 (twelve) hours as needed (for stomach pain or spasms).   losartan  (COZAAR ) 50 MG tablet Take 1 tablet (50 mg total) by mouth daily.   mexiletine (MEXITIL ) 150 MG capsule Take 1 capsule (150 mg total) by mouth 2 (two) times daily.   Multiple Vitamin (MULTIVITAMIN) tablet  Take 1 tablet by mouth daily.     nitroGLYCERIN  (NITROSTAT ) 0.4 MG SL tablet Place 0.4 mg under the tongue every 5 (five) minutes as needed for chest pain.   OXYGEN  Inhale into the lungs. 2lpm   polyethylene glycol (MIRALAX  / GLYCOLAX ) 17 g packet Take 17 g by mouth daily.   potassium chloride  (KLOR-CON  M) 10 MEQ tablet TAKE 1 TABLET BY MOUTH EVERY OTHER DAY *DO NOT CRUSH OR CHEW* *TAKE WITH FOOD*   sertraline  (ZOLOFT ) 25 MG tablet Take 1 tablet (25 mg total) by mouth daily.   umeclidinium-vilanterol (ANORO ELLIPTA ) 62.5-25 MCG/ACT AEPB INHALE 1 PUFF INTO THE LUNGS BY MOUTH ONCE DAILY   Zinc Oxide (TRIPLE PASTE) 12.8 % ointment Apply 1 Application topically as needed for irritation.   fish oil-omega-3 fatty acids 1000 MG capsule Take 1 g by mouth daily.  (Patient not taking: Reported on 10/13/2024)   Glucosamine-Chondroit-Vit C-Mn (GLUCOSAMINE CHONDR 1500 COMPLX PO) Take 1 tablet by mouth once. (Patient not taking: Reported on 10/13/2024)   No facility-administered encounter medications on file as of 10/13/2024.     Review of Systems  Constitutional:  Negative for activity change, appetite change, fatigue and unexpected weight change.  HENT:  Negative for congestion and hearing loss.   Eyes: Negative.   Respiratory:  Negative for cough and shortness of breath.   Cardiovascular:  Negative for chest pain, palpitations and leg swelling.  Gastrointestinal:  Negative for abdominal pain, constipation and diarrhea.  Genitourinary:  Negative for difficulty urinating and dysuria.  Musculoskeletal:  Positive for neck pain. Negative for arthralgias and myalgias.  Skin:  Negative for color change and wound.  Neurological:  Negative for dizziness and weakness.  Psychiatric/Behavioral:  Negative for agitation, behavioral problems and confusion.     Immunization History  Administered Date(s) Administered   Fluad Trivalent(High Dose 65+) 07/22/2023   Fluzone Influenza virus vaccine,trivalent (IIV3), split virus 07/17/2009, 07/07/2011, 07/28/2013, 07/17/2014, 07/26/2015, 07/18/2016, 07/27/2017   INFLUENZA, HIGH DOSE SEASONAL PF 07/01/2018, 08/02/2018, 08/07/2019, 08/06/2021   Influenza,inj,Quad PF,6+ Mos 07/26/2015   Influenza-Unspecified 08/15/2014, 07/08/2019, 07/04/2022   Moderna Covid-19 Vaccine Bivalent Booster 73yrs & up 07/19/2021, 08/06/2021   Moderna SARS-COV2 Booster Vaccination 08/17/2020   Moderna Sars-Covid-2 Vaccination 10/18/2019, 11/15/2019, 01/22/2021   PNEUMOCOCCAL CONJUGATE-20 08/06/2023   Pneumococcal Conjugate-13 05/22/2014   Pneumococcal Polysaccharide-23 01/24/2005, 07/01/2018   Pneumococcal-Unspecified 06/15/2014   RSV,unspecified 07/04/2022   Td 08/04/2022   Td (Adult) 12/27/2001   Tdap 05/12/2012   Unspecified SARS-COV-2 Vaccination 08/29/2022   Zoster Recombinant(Shingrix) 03/20/2018, 08/22/2018   Zoster, Live 08/10/2006, 03/20/2018, 08/24/2018   Pertinent  Health Maintenance Due  Topic Date Due   Mammogram  03/04/2008   Influenza Vaccine  05/06/2024   Bone Density Scan   Completed   Colonoscopy  Discontinued      07/21/2019    6:45 PM 08/04/2019   12:02 AM 08/11/2022    5:49 PM 05/18/2024    1:46 PM 08/03/2024    2:26 PM  Fall Risk  Falls in the past year?    0 0  Was there Caldwell injury with Fall?    0  0   Fall Risk Category Calculator    0 0  (RETIRED) Patient Fall Risk Level Low fall risk  Low fall risk  Low fall risk     Patient at Risk for Falls Due to    Impaired balance/gait;Impaired mobility Impaired balance/gait;Impaired mobility  Fall risk Follow up    Falls evaluation completed Falls evaluation completed  Data saved with a previous flowsheet row definition   Functional Status Survey:    Vitals:   10/13/24 0849  BP: (!) 136/56  Pulse: (!) 56  Resp: 18  Temp: (!) 97.3 F (36.3 C)  SpO2: 93%  Weight: 107 lb 6.4 oz (48.7 kg)  Height: 5' 5 (1.651 m)   Body mass index is 17.87 kg/m. Physical Exam Constitutional:      General: She is not in acute distress.    Appearance: She is well-developed. She is not diaphoretic.  HENT:     Head: Normocephalic and atraumatic.     Mouth/Throat:     Pharynx: No oropharyngeal exudate.  Eyes:     Conjunctiva/sclera: Conjunctivae normal.     Pupils: Pupils are equal, round, and reactive to light.  Cardiovascular:     Rate and Rhythm: Normal rate and regular rhythm.     Heart sounds: Normal heart sounds.  Pulmonary:     Effort: Pulmonary effort is normal.     Breath sounds: Normal breath sounds.  Abdominal:     General: Bowel sounds are normal.     Palpations: Abdomen is soft.  Musculoskeletal:     Cervical back: Normal range of motion and neck supple.     Right lower leg: No edema.     Left lower leg: No edema.  Skin:    General: Skin is warm and dry.  Neurological:     Mental Status: She is alert.  Psychiatric:        Mood and Affect: Mood normal.     Labs reviewed: Recent Labs    01/10/24 1959 01/11/24 1243 01/12/24 0600 08/30/24 0446 09/01/24 0610 09/02/24 0543  09/08/24 0000  NA 138 138   < > 135 136 137 139  K 3.6 3.9   < > 3.5 3.6 3.7 4.7  CL 102 101   < > 94* 96* 97* 101  CO2 27 28   < > 30 33* 30 30*  GLUCOSE 93 98   < > 108* 99 99  --   BUN 19 20   < > 19 25* 26* 22*  CREATININE 0.71 0.91   < > 0.91 0.92 0.90 0.9  CALCIUM  10.3 10.8*   < > 10.2 9.7 10.1 10.6  MG 2.1 2.0  --   --   --   --   --   PHOS  --  3.1  --   --   --   --   --    < > = values in this interval not displayed.   Recent Labs    01/12/24 2049 01/14/24 0538 06/20/24 0905 08/29/24 1311  AST 45* 53* 39* 28  ALT 28 29 26 11   ALKPHOS 100 90  --  97  BILITOT 1.0 1.4* 0.6 0.9  PROT 6.9 6.5 7.0 6.7  ALBUMIN 3.7 3.3*  --  3.4*   Recent Labs    03/15/24 1516 06/20/24 0905 08/29/24 1127 09/08/24 0000  WBC 3.8* 4.3 8.0 6.4  NEUTROABS  --  2,662 6.6 5,126.00  HGB 15.0 13.8 13.6 13.9  HCT 45.6 42.4 41.1 42  MCV 100.4* 100.0 100.2*  --   PLT 135* 152 114* 245   Lab Results  Component Value Date   TSH 3.10 06/20/2024   Lab Results  Component Value Date   HGBA1C 6.2 (H) 06/20/2024   Lab Results  Component Value Date   CHOL 180 06/20/2024   HDL 66 06/20/2024   LDLCALC 95  06/20/2024   TRIG 93 06/20/2024   CHOLHDL 2.7 06/20/2024    Significant Diagnostic Results in last 30 days:  No results found.  Assessment/Plan Moderate dementia with anxiety, unspecified dementia type (HCC) -Stable, no acute changes in cognitive or functional status, continue supportive care.  Plan for long term care.   Essential hypertension Blood pressure without improved control with increase in losartan  to 50 mg daily. Continues on coreg  6.25 mg bid, lasix  10 mg daily. Kcl 10 meq every other day.  Dyslipidemia Continue lipitor 20 mg daily. LDL at goal  COPD (chronic obstructive pulmonary disease) (HCC) Stable, Continue with Anoro Ellipta  inhaler qday, prn albuterol  inhaler  Chronic kidney disease, stage 3a (HCC) Chronic and stable Encourage proper hydration Follow  metabolic panel Avoid nephrotoxic meds (NSAIDS)  Chronic heart failure with preserved ejection fraction (HFpEF) (HCC) Euvolemic, continue on coreg  6.25 mg bid, losartan  25 mg daily, lasix  10 mg daily. Kcl 10 meq every other day.  Afib (HCC) Rate controlled  Continue Coreg  On low-dose Eliquis    Chronic hypoxic respiratory failure, on home oxygen  therapy (HCC) - Nocturnal 2 L/min Continue with nocturnal O2 @ 2 L/min  Neck pain Continues with tylenol  scheduled Will get Tiffany Caldwell to eval and treat   Marayah Higdon K. Caro BODILY [facility] [phone]       [1]  Allergies Allergen Reactions   Amlodipine  Swelling    Leg swelling and fatigue.  Other Reaction(s): edema/fatigue   Amoxicillin Other (See Comments)    No reaction. Just doesn't work anymore  Other Reaction(s): not effetive-took so many times   Hydromorphone  Other (See Comments)    ALTERED MENTAL STATUS  Altered mental status   Morphine  Other (See Comments)    Tiffany Caldwell and family report altered mental status.   Azithromycin Rash   Ciprofloxacin Nausea And Vomiting and Nausea Only   Hydromorphone  Hcl Other (See Comments)    Altered mental status Altered mental status   Levofloxacin  Nausea Only and Other (See Comments)   Lisinopril Other (See Comments)    Fatigue  Other Reaction(s): fatigue   "

## 2024-10-13 NOTE — Assessment & Plan Note (Signed)
 Blood pressure without improved control with increase in losartan  to 50 mg daily. Continues on coreg  6.25 mg bid, lasix  10 mg daily. Kcl 10 meq every other day.

## 2024-10-13 NOTE — Assessment & Plan Note (Signed)
 Chronic and stable Encourage proper hydration Follow metabolic panel Avoid nephrotoxic meds (NSAIDS)

## 2024-10-19 ENCOUNTER — Telehealth: Payer: Self-pay

## 2024-10-19 NOTE — Telephone Encounter (Signed)
 Patient is in rehab/skilled nursing unit at [facility]. They will need to contact Damien Ion at [phone].

## 2024-10-19 NOTE — Telephone Encounter (Signed)
 Copied from CRM [id number]. Topic: Clinical - Order For Equipment >> Oct 19, 2024 12:57 PM Marda MATSU wrote: Reason for CRM:  Verneita from [employer] states if patient is discontinuing her oxygen  she needs a letter of discontinuation order . Fax [phone]  Please advise

## 2024-10-24 ENCOUNTER — Non-Acute Institutional Stay (SKILLED_NURSING_FACILITY): Payer: Self-pay | Admitting: Orthopedic Surgery

## 2024-10-24 ENCOUNTER — Encounter: Payer: Self-pay | Admitting: Orthopedic Surgery

## 2024-10-24 DIAGNOSIS — R296 Repeated falls: Secondary | ICD-10-CM | POA: Diagnosis not present

## 2024-10-24 DIAGNOSIS — M546 Pain in thoracic spine: Secondary | ICD-10-CM | POA: Diagnosis not present

## 2024-10-24 DIAGNOSIS — F03B4 Unspecified dementia, moderate, with anxiety: Secondary | ICD-10-CM | POA: Diagnosis not present

## 2024-10-24 DIAGNOSIS — I5032 Chronic diastolic (congestive) heart failure: Secondary | ICD-10-CM | POA: Diagnosis not present

## 2024-10-24 DIAGNOSIS — J449 Chronic obstructive pulmonary disease, unspecified: Secondary | ICD-10-CM | POA: Diagnosis not present

## 2024-10-24 DIAGNOSIS — I10 Essential (primary) hypertension: Secondary | ICD-10-CM

## 2024-10-24 MED ORDER — PREDNISONE 20 MG PO TABS: ORAL_TABLET | ORAL | Status: AC

## 2024-10-24 MED ORDER — LIDOCAINE 4 % EX PTCH: 1.0000 | MEDICATED_PATCH | CUTANEOUS | Status: AC

## 2024-10-24 NOTE — Progress Notes (Signed)
 " Location:  Other [place].  Nursing Home Room Number: [facility] [id number] Place of Service:  SNF [phone]) Provider:  Greig Cluster, NP  PCP: Laurence Locus, DO  Patient Care Team: Laurence Locus, DO as PCP - General (Internal Medicine) Jakie Alm SAUNDERS, MD as Attending Physician (Gastroenterology) Claudene MICAEL Rogerio Mickey., MD (Obstetrics and Gynecology) Jason Charleston, MD (Inactive) (Radiation Oncology) Tona Vinie RAMAN, MD (Hematology and Oncology) Sheril Coy, MD as Consulting Physician (Orthopedic Surgery) Joshua Blamer, MD as Attending Physician (Dermatology) Leslee Reusing, MD as Consulting Physician (Ophthalmology) Shelah Charleston RAMAN, MD as Consulting Physician (Pulmonary Disease)  Extended Emergency Contact Information Primary Emergency Contact: Swearengin,John Address: [address]          [place], [place] [address] [place] Home Phone: [phone] Relation: Son Secondary Emergency Contact: Smith,Karen  [place] Home Phone: [phone] Relation: Daughter  Code Status:  Full Code Goals of care: Advanced Directive information    09/16/2024    2:20 PM  Advanced Directives  Does Patient Have a Medical Advance Directive? Yes  Type of Estate Agent of [place];Living will  Does patient want to make changes to medical advance directive? No - Patient declined  Copy of Healthcare Power of Attorney in Chart? No - copy requested  Would patient like information on creating a medical advance directive? No - Patient declined     Chief Complaint  Patient presents with   Flank Pain    Left Sided Pain.     HPI:  Pt is a 84 y.o. female seen today for acute visit due to left sided pain.   She currently resides on the skilled nursing unit at [facility]. PMH: atrial fibrillation, atherosclerosis, CHF, HTN, HLD, pulmonary HTN, COPD, emphysema, GERD, IBS, primary hyperparathyroidism, dementia, DDD, osteopenia, CKD, thrombocytopenia, right  lung mass and unstable gait.   Poor historian due to dementia. 01/17 she was found sitting on the floor by staff. Initially, she had no pain but later on c/o left sided pain. CXR was ordered> unremarkable. 01/18 she was found sitting on the floor in front of her recliner. No apparent injuries. She continued to c/o left sided pain. Scheduled tylenol  and oxycodone  recommended by on call provider. Today, she continues to c/o pain after pain medication administered. She denies chest pain or shortness of breath. No pain on inspiration. She is able to lift both legs in bed without difficulty. Afebrile. Vitals stable.          Past Medical History:  Diagnosis Date   Allergy    Arthritis    Breast cancer (HCC)    left breast   Coronary artery disease 2001   CABG   DDD (degenerative disc disease), lumbosacral    Diverticulosis of colon 09/23/2010   Qualifier: Diagnosis of   By: Marcelo CMA LEODIS), Dottie      IMO SNOMED Dx Update Oct 2024     Dyspnea    if rushing or climmbing lots of stairs   Emphysema lung (HCC)    emphysema   GERD (gastroesophageal reflux disease)    11/20/16- no a current problem   History of breast cancer 07/25/2011   Patient diagnosed with ductal carcinoma on 04/01/89. She underwent left partial mastectomy on 04/10/89. Nodes negative, ER+, PR-. She then was diagnosed with recurrent left breast cancer (DCIS) and underwent left breast mastectomy on 02/01/2004. Apparently no receptors tested. Tram reconstruction done     Hyperlipidemia    Hypertension  Irritable bowel syndrome (IBS)    Lung disorder    leison   Neuropathy    Pinched nerve    back and left leg   Pneumonia 2016   Past Surgical History:  Procedure Laterality Date   BREAST FIBROADENOMA SURGERY  01/13/1980   BREAST SURGERY  02/01/2004   tram/mastectomyfor recurrence   CARPOMETACARPEL SUSPENSION PLASTY Right 12/17/2017   Procedure: SUSPENSIONPLASTY RIGHT THUMB WITH EXCISION TRAPEZIUM;  Surgeon:  Murrell Kuba, MD;  Location: [facility];  Service: Orthopedics;  Laterality: Right;   COLONOSCOPY     CORONARY ANGIOPLASTY  2001   had tear to two vessels- had to have open heart surgery   CORONARY ARTERY BYPASS GRAFT  2001   EYE SURGERY Bilateral    Cataract   KNEE ARTHROSCOPY  January 2011   right   MASTECTOMY PARTIAL / LUMPECTOMY W/ AXILLARY LYMPHADENECTOMY  04/10/1989   Dr Neysa / left breast   PARTIAL HYSTERECTOMY     vaginal   RIGHT/LEFT HEART CATH AND CORONARY ANGIOGRAPHY Bilateral 06/17/2023   Procedure: RIGHT/LEFT HEART CATH AND CORONARY ANGIOGRAPHY;  Surgeon: Mady Bruckner, MD;  Location: [facility];  Service: Cardiovascular;  Laterality: Bilateral;   TENDON TRANSFER Right 12/17/2017   Procedure: ABDUCTOR POLLICIS LONGUS TRANSFER;  Surgeon: Murrell Kuba, MD;  Location: [facility];  Service: Orthopedics;  Laterality: Right;   TONSILLECTOMY AND ADENOIDECTOMY     TOTAL HIP ARTHROPLASTY Right 08/05/2016   Procedure: TOTAL HIP ARTHROPLASTY ANTERIOR APPROACH;  Surgeon: Maude Herald, MD;  Location: [facility] OR;  Service: Orthopedics;  Laterality: Right;   UPPER GASTROINTESTINAL ENDOSCOPY     VIDEO BRONCHOSCOPY WITH ENDOBRONCHIAL NAVIGATION N/A 11/27/2016   Procedure: VIDEO BRONCHOSCOPY WITH ENDOBRONCHIAL NAVIGATION;  Surgeon: Elspeth JAYSON Millers, MD;  Location: [facility] OR;  Service: Thoracic;  Laterality: N/A;    Allergies[1]  Outpatient Encounter Medications as of 10/24/2024  Medication Sig   acetaminophen  (TYLENOL ) 325 MG tablet Take 650 mg by mouth every 4 (four) hours as needed.   Acetaminophen  Extra Strength 500 MG TABS TAKE 2 TABLETS =1000MG  BY MOUTH TWICE DAILY *DO NOT EXCEED 4GM OF TYLENOL  IN 24 HOURS* (PATIENT REQUEST CAPLET)   albuterol  (VENTOLIN  HFA) 108 (90 Base) MCG/ACT inhaler Inhale 1-2 puffs into the lungs every 6 (six) hours as needed for wheezing or shortness of breath. prn   aspirin  EC 81 MG tablet Take 81 mg by mouth daily. Swallow  whole.   atorvastatin  (LIPITOR) 20 MG tablet Take 1 tablet (20 mg total) by mouth daily.   Biotin 89999 MCG TABS Take 1 tablet by mouth daily.   bisacodyl  (DULCOLAX) 5 MG EC tablet Take 1 tablet (5 mg total) by mouth daily as needed for moderate constipation.   carvedilol  (COREG ) 6.25 MG tablet Take 1 tablet (6.25 mg total) by mouth 2 (two) times daily with a meal.   DICYCLOMINE HCL PO Take 10 mg by mouth every 6 (six) hours as needed.   famotidine (PEPCID) 20 MG tablet Take 20 mg by mouth daily as needed for heartburn or indigestion.   feeding supplement (ENSURE PLUS HIGH PROTEIN) LIQD Take 237 mLs by mouth 2 (two) times daily between meals.   fexofenadine (ALLEGRA) 180 MG tablet Take 180 mg by mouth daily.   furosemide  (LASIX ) 20 MG tablet Take 0.5 tablets (10 mg total) by mouth daily. Increase to 1 tablet (20 mg total) by mouth ONCE daily (total daily dose 20 mg) as needed for up to 3 days for increased leg  swelling, shortness of breath, weight gain 5+ lbs over 1-2 days. Seek medical care if these symptoms are not improving with increased dose.   Hyoscyamine  Sulfate (HYOSCYAMINE  PO) Take 0.375 mg by mouth every 12 (twelve) hours as needed (for stomach pain or spasms).   losartan  (COZAAR ) 50 MG tablet Take 1 tablet (50 mg total) by mouth daily.   mexiletine (MEXITIL ) 150 MG capsule Take 1 capsule (150 mg total) by mouth 2 (two) times daily.   Multiple Vitamin (MULTIVITAMIN) tablet Take 1 tablet by mouth daily.     nitroGLYCERIN  (NITROSTAT ) 0.4 MG SL tablet Place 0.4 mg under the tongue every 5 (five) minutes as needed for chest pain.   OXYGEN  Inhale into the lungs. 2lpm   polyethylene glycol (MIRALAX  / GLYCOLAX ) 17 g packet Take 17 g by mouth daily.   potassium chloride  (KLOR-CON  M) 10 MEQ tablet TAKE 1 TABLET BY MOUTH EVERY OTHER DAY *DO NOT CRUSH OR CHEW* *TAKE WITH FOOD*   sertraline  (ZOLOFT ) 25 MG tablet Take 1 tablet (25 mg total) by mouth daily.   umeclidinium-vilanterol (ANORO ELLIPTA )  62.5-25 MCG/ACT AEPB INHALE 1 PUFF INTO THE LUNGS BY MOUTH ONCE DAILY   Zinc Oxide (TRIPLE PASTE) 12.8 % ointment Apply 1 Application topically as needed for irritation.   fish oil-omega-3 fatty acids 1000 MG capsule Take 1 g by mouth daily.  (Patient not taking: Reported on 10/24/2024)   Glucosamine-Chondroit-Vit C-Mn (GLUCOSAMINE CHONDR 1500 COMPLX PO) Take 1 tablet by mouth once. (Patient not taking: Reported on 10/24/2024)   No facility-administered encounter medications on file as of 10/24/2024.    Review of Systems  Unable to perform ROS: Dementia    Immunization History  Administered Date(s) Administered   Fluad Trivalent(High Dose 65+) 07/22/2023   Fluzone Influenza virus vaccine,trivalent (IIV3), split virus 07/17/2009, 07/07/2011, 07/28/2013, 07/17/2014, 07/26/2015, 07/18/2016, 07/27/2017   INFLUENZA, HIGH DOSE SEASONAL PF 07/01/2018, 08/02/2018, 08/07/2019, 08/06/2021   Influenza,inj,Quad PF,6+ Mos 07/26/2015   Influenza-Unspecified 08/15/2014, 07/08/2019, 07/04/2022, 09/04/2024   Moderna Covid-19 Vaccine Bivalent Booster 56yrs & up 07/19/2021, 08/06/2021   Moderna SARS-COV2 Booster Vaccination 08/17/2020   Moderna Sars-Covid-2 Vaccination 10/18/2019, 11/15/2019, 01/22/2021   PNEUMOCOCCAL CONJUGATE-20 08/06/2023   Pneumococcal Conjugate-13 05/22/2014   Pneumococcal Polysaccharide-23 01/24/2005, 07/01/2018   Pneumococcal-Unspecified 06/15/2014   RSV,unspecified 07/04/2022   Td 08/04/2022   Td (Adult) 12/27/2001   Tdap 05/12/2012   Unspecified SARS-COV-2 Vaccination 08/29/2022   Zoster Recombinant(Shingrix) 03/20/2018, 08/22/2018   Zoster, Live 08/10/2006, 03/20/2018, 08/24/2018   Pertinent  Health Maintenance Due  Topic Date Due   Mammogram  03/04/2008   Influenza Vaccine  Completed   Bone Density Scan  Completed   Colonoscopy  Discontinued      07/21/2019    6:45 PM 08/04/2019   12:02 AM 08/11/2022    5:49 PM 05/18/2024    1:46 PM 08/03/2024    2:26 PM  Fall Risk   Falls in the past year?    0 0  Was there an injury with Fall?    0  0   Fall Risk Category Calculator    0 0  (RETIRED) Patient Fall Risk Level Low fall risk  Low fall risk  Low fall risk     Patient at Risk for Falls Due to    Impaired balance/gait;Impaired mobility Impaired balance/gait;Impaired mobility  Fall risk Follow up    Falls evaluation completed Falls evaluation completed     Data saved with a previous flowsheet row definition   Functional Status Survey:  Vitals:   10/24/24 0929 10/24/24 0941  BP: (!) 183/80 130/63  Pulse: 63   Resp: 16   Temp: (!) 97.1 F (36.2 C)   SpO2: 96%   Weight: 108 lb 6.4 oz (49.2 kg)   Height: 5' 5 (1.651 m)    Body mass index is 18.04 kg/m. Physical Exam Vitals reviewed.  Constitutional:      General: She is not in acute distress. HENT:     Head: Normocephalic.  Eyes:     General:        Right eye: No discharge.        Left eye: No discharge.  Cardiovascular:     Rate and Rhythm: Normal rate and regular rhythm.     Pulses: Normal pulses.     Heart sounds: Normal heart sounds.  Pulmonary:     Effort: Pulmonary effort is normal.     Breath sounds: Normal breath sounds.  Chest:     Chest wall: Tenderness present. No deformity or swelling.     Comments: Tenderness under left axilla, no bruising or skin breakdown Abdominal:     General: Bowel sounds are normal. There is no distension.     Palpations: Abdomen is soft.     Tenderness: There is no abdominal tenderness.  Musculoskeletal:     Cervical back: Normal and neck supple.     Thoracic back: Tenderness present. No swelling or deformity.     Lumbar back: No swelling, deformity or tenderness.     Right hip: No deformity or tenderness. Normal range of motion.     Left hip: No deformity or tenderness. Normal range of motion.     Right lower leg: No edema.     Left lower leg: No edema.     Comments: Tenderness near left scapula   Skin:    General: Skin is warm.      Capillary Refill: Capillary refill takes less than 2 seconds.  Neurological:     General: No focal deficit present.     Mental Status: She is alert. Mental status is at baseline.     Gait: Gait abnormal.  Psychiatric:        Mood and Affect: Mood normal.     Labs reviewed: Recent Labs    01/10/24 1959 01/11/24 1243 01/12/24 0600 08/30/24 0446 09/01/24 0610 09/02/24 0543 09/08/24 0000  NA 138 138   < > 135 136 137 139  K 3.6 3.9   < > 3.5 3.6 3.7 4.7  CL 102 101   < > 94* 96* 97* 101  CO2 27 28   < > 30 33* 30 30*  GLUCOSE 93 98   < > 108* 99 99  --   BUN 19 20   < > 19 25* 26* 22*  CREATININE 0.71 0.91   < > 0.91 0.92 0.90 0.9  CALCIUM  10.3 10.8*   < > 10.2 9.7 10.1 10.6  MG 2.1 2.0  --   --   --   --   --   PHOS  --  3.1  --   --   --   --   --    < > = values in this interval not displayed.   Recent Labs    01/12/24 2049 01/14/24 0538 06/20/24 0905 08/29/24 1311  AST 45* 53* 39* 28  ALT 28 29 26 11   ALKPHOS 100 90  --  97  BILITOT 1.0 1.4* 0.6 0.9  PROT 6.9 6.5 7.0  6.7  ALBUMIN 3.7 3.3*  --  3.4*   Recent Labs    03/15/24 1516 06/20/24 0905 08/29/24 1127 09/08/24 0000  WBC 3.8* 4.3 8.0 6.4  NEUTROABS  --  2,662 6.6 5,126.00  HGB 15.0 13.8 13.6 13.9  HCT 45.6 42.4 41.1 42  MCV 100.4* 100.0 100.2*  --   PLT 135* 152 114* 245   Lab Results  Component Value Date   TSH 3.10 06/20/2024   Lab Results  Component Value Date   HGBA1C 6.2 (H) 06/20/2024   Lab Results  Component Value Date   CHOL 180 06/20/2024   HDL 66 06/20/2024   LDLCALC 95 06/20/2024   TRIG 93 06/20/2024   CHOLHDL 2.7 06/20/2024    Significant Diagnostic Results in last 30 days:  No results found.  Assessment/Plan 1. Acute left-sided thoracic back pain (Primary) - fall 01/17 & 01/18 - CXR unremarkable - no bruising or deformity, tenderness under left axilla extending under left scapula  - cont tylenol   - Xray cervical, thoracic, lumbar spine r/o compression fracture  -  start lidocaine  4% patch x 14 days - start Prednisone  taper x 10 days   2. Moderate dementia with anxiety, unspecified dementia type (HCC) - increased confusion  - ? Recent move to SNF verus underlying infection  - no agitation  - not on medication  - UA/culture, bmp  3. Essential hypertension - improved with increased losartan    4. Chronic obstructive pulmonary disease, unspecified COPD type (HCC) - no recent exacerbations - cont albuterol  prn and Anoro Ellipta    5. Chronic heart failure with preserved ejection fraction (HFpEF) (HCC) - compensated - cont furosemide     Family/ staff Communication: plan discussed with patient and nurse  Labs/tests ordered:  xray cervical/thoracic/lumbar spine, bmp, UA/culture        [1]  Allergies Allergen Reactions   Amlodipine  Swelling    Leg swelling and fatigue.  Other Reaction(s): edema/fatigue   Amoxicillin Other (See Comments)    No reaction. Just doesn't work anymore  Other Reaction(s): not effetive-took so many times   Hydromorphone  Other (See Comments)    ALTERED MENTAL STATUS  Altered mental status   Morphine  Other (See Comments)    Pt and family report altered mental status.   Azithromycin Rash   Ciprofloxacin Nausea And Vomiting and Nausea Only   Hydromorphone  Hcl Other (See Comments)    Altered mental status Altered mental status   Levofloxacin  Nausea Only and Other (See Comments)   Lisinopril Other (See Comments)    Fatigue  Other Reaction(s): fatigue   "

## 2024-10-27 LAB — BASIC METABOLIC PANEL WITH GFR
BUN: 28 — AB (ref 4–21)
CO2: 28 — AB (ref 13–22)
Chloride: 107 (ref 99–108)
Creatinine: 0.8 (ref 0.5–1.1)
Glucose: 78
Potassium: 4 meq/L (ref 3.5–5.1)
Sodium: 141 (ref 137–147)

## 2024-10-27 LAB — COMPREHENSIVE METABOLIC PANEL WITH GFR
Calcium: 9.9 (ref 8.7–10.7)
eGFR: 69

## 2024-11-02 ENCOUNTER — Encounter: Admitting: Orthopedic Surgery

## 2024-11-10 ENCOUNTER — Non-Acute Institutional Stay (SKILLED_NURSING_FACILITY): Admitting: Nurse Practitioner

## 2024-11-10 ENCOUNTER — Encounter: Payer: Self-pay | Admitting: Nurse Practitioner

## 2024-11-10 DIAGNOSIS — J029 Acute pharyngitis, unspecified: Secondary | ICD-10-CM

## 2024-11-10 NOTE — Progress Notes (Signed)
 " Location:  Other [facility] [place].  Nursing Home Room Number: [facility] [id number] Place of Service:  SNF [phone]) Harlene An, NP  PCP: Laurence Locus, DO  Patient Care Team: Laurence Locus, DO as PCP - General (Internal Medicine) Jakie Alm SAUNDERS, MD as Attending Physician (Gastroenterology) Claudene MICAEL Rogerio Mickey., MD (Obstetrics and Gynecology) Jason Charleston, MD (Inactive) (Radiation Oncology) Tona Vinie RAMAN, MD (Hematology and Oncology) Sheril Coy, MD as Consulting Physician (Orthopedic Surgery) Joshua Blamer, MD as Attending Physician (Dermatology) Leslee Reusing, MD as Consulting Physician (Ophthalmology) Shelah Charleston RAMAN, MD as Consulting Physician (Pulmonary Disease)  Extended Emergency Contact Information Primary Emergency Contact: Lehrmann,John Address: [address]          [place], [place] [address] [place] Home Phone: [phone] Relation: Son Secondary Emergency Contact: Smith,Karen  [place] Home Phone: [phone] Relation: Daughter  Goals of care: Advanced Directive information    09/16/2024    2:20 PM  Advanced Directives  Does Patient Have a Medical Advance Directive? Yes  Type of Estate Agent of [place];Living will  Does patient want to make changes to medical advance directive? No - Patient declined  Copy of Healthcare Power of Attorney in Chart? No - copy requested  Would patient like information on creating a medical advance directive? No - Patient declined     Chief Complaint  Patient presents with   Sore Throat    Sore Throat    HPI:  Pt is a 84 y.o. female seen today for an acute visit for Sore Throat. Staff reports that starting yesterday pt was crying and complaining of a sore throat. Today she reports this is much better. Reports throat is scratchy feeling today but improved from yesterday.  She reports she feels well today. No feelings of aches or muscle pains. No fever or chills No  nasal congestion or chest congestion No cough or worsening shortness of breath    Past Medical History:  Diagnosis Date   Allergy    Arthritis    Breast cancer (HCC)    left breast   Coronary artery disease 2001   CABG   DDD (degenerative disc disease), lumbosacral    Diverticulosis of colon 09/23/2010   Qualifier: Diagnosis of   By: Marcelo CMA LEODIS), Dottie      IMO SNOMED Dx Update Oct 2024     Dyspnea    if rushing or climmbing lots of stairs   Emphysema lung (HCC)    emphysema   GERD (gastroesophageal reflux disease)    11/20/16- no a current problem   History of breast cancer 07/25/2011   Patient diagnosed with ductal carcinoma on 04/01/89. She underwent left partial mastectomy on 04/10/89. Nodes negative, ER+, PR-. She then was diagnosed with recurrent left breast cancer (DCIS) and underwent left breast mastectomy on 02/01/2004. Apparently no receptors tested. Tram reconstruction done     Hyperlipidemia    Hypertension    Irritable bowel syndrome (IBS)    Lung disorder    leison   Neuropathy    Pinched nerve    back and left leg   Pneumonia 2016   Past Surgical History:  Procedure Laterality Date   BREAST FIBROADENOMA SURGERY  01/13/1980   BREAST SURGERY  02/01/2004   tram/mastectomyfor recurrence   CARPOMETACARPEL SUSPENSION PLASTY Right 12/17/2017   Procedure: SUSPENSIONPLASTY RIGHT THUMB WITH EXCISION TRAPEZIUM;  Surgeon: Murrell Kuba, MD;  Location: [facility];  Service: Orthopedics;  Laterality: Right;  COLONOSCOPY     CORONARY ANGIOPLASTY  2001   had tear to two vessels- had to have open heart surgery   CORONARY ARTERY BYPASS GRAFT  2001   EYE SURGERY Bilateral    Cataract   KNEE ARTHROSCOPY  January 2011   right   MASTECTOMY PARTIAL / LUMPECTOMY W/ AXILLARY LYMPHADENECTOMY  04/10/1989   Dr Neysa / left breast   PARTIAL HYSTERECTOMY     vaginal   RIGHT/LEFT HEART CATH AND CORONARY ANGIOGRAPHY Bilateral 06/17/2023   Procedure: RIGHT/LEFT  HEART CATH AND CORONARY ANGIOGRAPHY;  Surgeon: Mady Bruckner, MD;  Location: [facility];  Service: Cardiovascular;  Laterality: Bilateral;   TENDON TRANSFER Right 12/17/2017   Procedure: ABDUCTOR POLLICIS LONGUS TRANSFER;  Surgeon: Murrell Kuba, MD;  Location: [facility];  Service: Orthopedics;  Laterality: Right;   TONSILLECTOMY AND ADENOIDECTOMY     TOTAL HIP ARTHROPLASTY Right 08/05/2016   Procedure: TOTAL HIP ARTHROPLASTY ANTERIOR APPROACH;  Surgeon: Maude Herald, MD;  Location: [facility];  Service: Orthopedics;  Laterality: Right;   UPPER GASTROINTESTINAL ENDOSCOPY     VIDEO BRONCHOSCOPY WITH ENDOBRONCHIAL NAVIGATION N/A 11/27/2016   Procedure: VIDEO BRONCHOSCOPY WITH ENDOBRONCHIAL NAVIGATION;  Surgeon: Elspeth JAYSON Millers, MD;  Location: [facility] OR;  Service: Thoracic;  Laterality: N/A;    Allergies[1]  Outpatient Encounter Medications as of 11/10/2024  Medication Sig   acetaminophen  (TYLENOL ) 325 MG tablet Take 650 mg by mouth every 4 (four) hours as needed.   Acetaminophen  Extra Strength 500 MG TABS TAKE 2 TABLETS =1000MG  BY MOUTH TWICE DAILY *DO NOT EXCEED 4GM OF TYLENOL  IN 24 HOURS* (PATIENT REQUEST CAPLET)   albuterol  (VENTOLIN  HFA) 108 (90 Base) MCG/ACT inhaler Inhale 1-2 puffs into the lungs every 6 (six) hours as needed for wheezing or shortness of breath. prn   aspirin  EC 81 MG tablet Take 81 mg by mouth daily. Swallow whole.   atorvastatin  (LIPITOR) 20 MG tablet Take 1 tablet (20 mg total) by mouth daily.   bisacodyl  (DULCOLAX) 5 MG EC tablet Take 1 tablet (5 mg total) by mouth daily as needed for moderate constipation.   carvedilol  (COREG ) 6.25 MG tablet Take 1 tablet (6.25 mg total) by mouth 2 (two) times daily with a meal.   DICYCLOMINE HCL PO Take 10 mg by mouth every 6 (six) hours as needed.   famotidine (PEPCID) 20 MG tablet Take 20 mg by mouth daily as needed for heartburn or indigestion.   feeding supplement (ENSURE PLUS HIGH PROTEIN) LIQD Take 237 mLs  by mouth 2 (two) times daily between meals.   fexofenadine (ALLEGRA) 180 MG tablet Take 180 mg by mouth daily.   furosemide  (LASIX ) 20 MG tablet Take 0.5 tablets (10 mg total) by mouth daily. Increase to 1 tablet (20 mg total) by mouth ONCE daily (total daily dose 20 mg) as needed for up to 3 days for increased leg swelling, shortness of breath, weight gain 5+ lbs over 1-2 days. Seek medical care if these symptoms are not improving with increased dose.   Hyoscyamine  Sulfate (HYOSCYAMINE  PO) Take 0.375 mg by mouth every 12 (twelve) hours as needed (for stomach pain or spasms).   losartan  (COZAAR ) 50 MG tablet Take 1 tablet (50 mg total) by mouth daily.   mexiletine (MEXITIL ) 150 MG capsule Take 1 capsule (150 mg total) by mouth 2 (two) times daily.   Multiple Vitamin (MULTIVITAMIN) tablet Take 1 tablet by mouth daily.     nitroGLYCERIN  (NITROSTAT ) 0.4 MG SL tablet Place 0.4 mg  under the tongue every 5 (five) minutes as needed for chest pain.   OXYGEN  Inhale into the lungs. 2lpm   polyethylene glycol (MIRALAX  / GLYCOLAX ) 17 g packet Take 17 g by mouth daily.   potassium chloride  (KLOR-CON  M) 10 MEQ tablet TAKE 1 TABLET BY MOUTH EVERY OTHER DAY *DO NOT CRUSH OR CHEW* *TAKE WITH FOOD*   sertraline  (ZOLOFT ) 25 MG tablet Take 1 tablet (25 mg total) by mouth daily.   umeclidinium-vilanterol (ANORO ELLIPTA ) 62.5-25 MCG/ACT AEPB INHALE 1 PUFF INTO THE LUNGS BY MOUTH ONCE DAILY   Zinc Oxide (TRIPLE PASTE) 12.8 % ointment Apply 1 Application topically as needed for irritation.   Biotin 89999 MCG TABS Take 1 tablet by mouth daily. (Patient not taking: Reported on 11/10/2024)   fish oil-omega-3 fatty acids 1000 MG capsule Take 1 g by mouth daily.  (Patient not taking: Reported on 11/10/2024)   Glucosamine-Chondroit-Vit C-Mn (GLUCOSAMINE CHONDR 1500 COMPLX PO) Take 1 tablet by mouth once. (Patient not taking: Reported on 11/10/2024)   No facility-administered encounter medications on file as of 11/10/2024.    Review  of Systems  Constitutional:  Negative for activity change, appetite change, fatigue and unexpected weight change.  HENT:  Positive for sore throat. Negative for congestion, ear discharge, ear pain, hearing loss, postnasal drip, rhinorrhea, sinus pressure and sinus pain.   Eyes: Negative.   Respiratory:  Negative for cough and shortness of breath.   Cardiovascular:  Negative for chest pain, palpitations and leg swelling.  Gastrointestinal:  Negative for abdominal pain, constipation and diarrhea.  Genitourinary:  Negative for difficulty urinating and dysuria.  Musculoskeletal:  Negative for arthralgias and myalgias.  Skin:  Negative for color change.  Neurological:  Negative for dizziness and weakness.  Psychiatric/Behavioral:  Positive for confusion. Negative for agitation and behavioral problems.     Immunization History  Administered Date(s) Administered   Fluad Trivalent(High Dose 65+) 07/22/2023   Fluzone Influenza virus vaccine,trivalent (IIV3), split virus 07/17/2009, 07/07/2011, 07/28/2013, 07/17/2014, 07/26/2015, 07/18/2016, 07/27/2017   INFLUENZA, HIGH DOSE SEASONAL PF 07/01/2018, 08/02/2018, 08/07/2019, 08/06/2021   Influenza,inj,Quad PF,6+ Mos 07/26/2015   Influenza-Unspecified 08/15/2014, 07/08/2019, 07/04/2022, 09/04/2024   Moderna Covid-19 Vaccine Bivalent Booster 17yrs & up 07/19/2021, 08/06/2021   Moderna SARS-COV2 Booster Vaccination 08/17/2020   Moderna Sars-Covid-2 Vaccination 10/18/2019, 11/15/2019, 01/22/2021   PNEUMOCOCCAL CONJUGATE-20 08/06/2023   Pneumococcal Conjugate-13 05/22/2014   Pneumococcal Polysaccharide-23 01/24/2005, 07/01/2018   Pneumococcal-Unspecified 06/15/2014   RSV,unspecified 07/04/2022   Td 08/04/2022   Td (Adult) 12/27/2001   Tdap 05/12/2012   Unspecified SARS-COV-2 Vaccination 08/29/2022   Zoster Recombinant(Shingrix) 03/20/2018, 08/22/2018   Zoster, Live 08/10/2006, 03/20/2018, 08/24/2018   Pertinent  Health Maintenance Due  Topic Date  Due   Mammogram  03/04/2008   Influenza Vaccine  Completed   Bone Density Scan  Completed   Colonoscopy  Discontinued      08/04/2019   12:02 AM 08/11/2022    5:49 PM 05/18/2024    1:46 PM 08/03/2024    2:26 PM 10/24/2024    1:17 PM  Fall Risk  Falls in the past year?   0 0 1  Was there an injury with Fall?   0  0  1  Fall Risk Category Calculator   0 0 3  (RETIRED) Patient Fall Risk Level Low fall risk  Low fall risk      Patient at Risk for Falls Due to   Impaired balance/gait;Impaired mobility Impaired balance/gait;Impaired mobility History of fall(s);Impaired balance/gait  Fall risk Follow up  Falls evaluation completed Falls evaluation completed Falls evaluation completed     Data saved with a previous flowsheet row definition   Functional Status Survey:    Vitals:   11/10/24 0930 11/10/24 0941  BP: (!) 156/87 (!) 156/87  Pulse: 81   Resp: 18   Temp: (!) 96.5 F (35.8 C)   SpO2: 90%   Weight: 109 lb (49.4 kg)   Height: 5' 5 (1.651 m)    Body mass index is 18.14 kg/m. Physical Exam Constitutional:      General: She is not in acute distress.    Appearance: She is well-developed. She is not diaphoretic.  HENT:     Head: Normocephalic and atraumatic.     Mouth/Throat:     Pharynx: No oropharyngeal exudate.  Eyes:     Conjunctiva/sclera: Conjunctivae normal.     Pupils: Pupils are equal, round, and reactive to light.  Cardiovascular:     Rate and Rhythm: Normal rate and regular rhythm.     Heart sounds: Normal heart sounds.  Pulmonary:     Effort: Pulmonary effort is normal.     Breath sounds: Normal breath sounds.  Abdominal:     General: Bowel sounds are normal.     Palpations: Abdomen is soft.  Musculoskeletal:     Cervical back: Normal range of motion and neck supple.     Right lower leg: No edema.     Left lower leg: No edema.  Skin:    General: Skin is warm and dry.  Neurological:     Mental Status: She is alert.  Psychiatric:        Mood and  Affect: Mood normal.     Labs reviewed: Recent Labs    01/10/24 1959 01/11/24 1243 01/12/24 0600 08/30/24 0446 09/01/24 0610 09/02/24 0543 09/08/24 0000 10/27/24 0000  NA 138 138   < > 135 136 137 139 141  K 3.6 3.9   < > 3.5 3.6 3.7 4.7 4.0  CL 102 101   < > 94* 96* 97* 101 107  CO2 27 28   < > 30 33* 30 30* 28*  GLUCOSE 93 98   < > 108* 99 99  --   --   BUN 19 20   < > 19 25* 26* 22* 28*  CREATININE 0.71 0.91   < > 0.91 0.92 0.90 0.9 0.8  CALCIUM  10.3 10.8*   < > 10.2 9.7 10.1 10.6 9.9  MG 2.1 2.0  --   --   --   --   --   --   PHOS  --  3.1  --   --   --   --   --   --    < > = values in this interval not displayed.   Recent Labs    01/12/24 2049 01/14/24 0538 06/20/24 0905 08/29/24 1311  AST 45* 53* 39* 28  ALT 28 29 26 11   ALKPHOS 100 90  --  97  BILITOT 1.0 1.4* 0.6 0.9  PROT 6.9 6.5 7.0 6.7  ALBUMIN 3.7 3.3*  --  3.4*   Recent Labs    03/15/24 1516 06/20/24 0905 08/29/24 1127 09/08/24 0000  WBC 3.8* 4.3 8.0 6.4  NEUTROABS  --  2,662 6.6 5,126.00  HGB 15.0 13.8 13.6 13.9  HCT 45.6 42.4 41.1 42  MCV 100.4* 100.0 100.2*  --   PLT 135* 152 114* 245   Lab Results  Component Value Date   TSH  3.10 06/20/2024   Lab Results  Component Value Date   HGBA1C 6.2 (H) 06/20/2024   Lab Results  Component Value Date   CHOL 180 06/20/2024   HDL 66 06/20/2024   LDLCALC 95 06/20/2024   TRIG 93 06/20/2024   CHOLHDL 2.7 06/20/2024    Significant Diagnostic Results in last 30 days:  No results found.  Assessment/Plan 1. Sore throat (Primary) Likely viral but feeling better today Will have staff continue to monitor symptoms and offer support care To notify for changes.     Dangelo Guzzetta K. Caro BODILY [facility] [phone]       [1]  Allergies Allergen Reactions   Amlodipine  Swelling    Leg swelling and fatigue.  Other Reaction(s): edema/fatigue   Amoxicillin Other (See Comments)    No reaction. Just doesn't work  anymore  Other Reaction(s): not effetive-took so many times   Hydromorphone  Other (See Comments)    ALTERED MENTAL STATUS  Altered mental status   Morphine  Other (See Comments)    Pt and family report altered mental status.   Azithromycin Rash   Ciprofloxacin Nausea And Vomiting and Nausea Only   Hydromorphone  Hcl Other (See Comments)    Altered mental status Altered mental status   Levofloxacin  Nausea Only and Other (See Comments)   Lisinopril Other (See Comments)    Fatigue  Other Reaction(s): fatigue   "

## 2024-11-16 ENCOUNTER — Encounter: Admitting: Internal Medicine
